# Patient Record
Sex: Female | Born: 1935 | Race: White | Hispanic: No | Smoking: Former smoker
Health system: Southern US, Community
[De-identification: ages and names within clinical notes are randomized; demographics above are authoritative.]

## PROBLEM LIST (undated history)

## (undated) DIAGNOSIS — I10 Essential (primary) hypertension: Secondary | ICD-10-CM

## (undated) DIAGNOSIS — M1990 Unspecified osteoarthritis, unspecified site: Secondary | ICD-10-CM

## (undated) DIAGNOSIS — F32A Depression, unspecified: Secondary | ICD-10-CM

## (undated) DIAGNOSIS — N289 Disorder of kidney and ureter, unspecified: Secondary | ICD-10-CM

## (undated) DIAGNOSIS — E119 Type 2 diabetes mellitus without complications: Secondary | ICD-10-CM

## (undated) DIAGNOSIS — E785 Hyperlipidemia, unspecified: Secondary | ICD-10-CM

## (undated) DIAGNOSIS — M5134 Other intervertebral disc degeneration, thoracic region: Secondary | ICD-10-CM

## (undated) DIAGNOSIS — W19XXXA Unspecified fall, initial encounter: Secondary | ICD-10-CM

## (undated) DIAGNOSIS — M7660 Achilles tendinitis, unspecified leg: Secondary | ICD-10-CM

## (undated) DIAGNOSIS — K5792 Diverticulitis of intestine, part unspecified, without perforation or abscess without bleeding: Secondary | ICD-10-CM

## (undated) DIAGNOSIS — I639 Cerebral infarction, unspecified: Secondary | ICD-10-CM

## (undated) DIAGNOSIS — F329 Major depressive disorder, single episode, unspecified: Secondary | ICD-10-CM

## (undated) DIAGNOSIS — N318 Other neuromuscular dysfunction of bladder: Secondary | ICD-10-CM

## (undated) DIAGNOSIS — H8110 Benign paroxysmal vertigo, unspecified ear: Secondary | ICD-10-CM

## (undated) DIAGNOSIS — F419 Anxiety disorder, unspecified: Secondary | ICD-10-CM

## (undated) DIAGNOSIS — K52832 Lymphocytic colitis: Secondary | ICD-10-CM

## (undated) DIAGNOSIS — H409 Unspecified glaucoma: Secondary | ICD-10-CM

## (undated) DIAGNOSIS — R413 Other amnesia: Secondary | ICD-10-CM

## (undated) DIAGNOSIS — M810 Age-related osteoporosis without current pathological fracture: Secondary | ICD-10-CM

## (undated) DIAGNOSIS — M25562 Pain in left knee: Secondary | ICD-10-CM

## (undated) DIAGNOSIS — N183 Chronic kidney disease, stage 3 unspecified: Secondary | ICD-10-CM

## (undated) DIAGNOSIS — Z853 Personal history of malignant neoplasm of breast: Secondary | ICD-10-CM

## (undated) DIAGNOSIS — M79606 Pain in leg, unspecified: Secondary | ICD-10-CM

## (undated) DIAGNOSIS — K219 Gastro-esophageal reflux disease without esophagitis: Secondary | ICD-10-CM

## (undated) DIAGNOSIS — E669 Obesity, unspecified: Secondary | ICD-10-CM

## (undated) HISTORY — DX: Other neuromuscular dysfunction of bladder: N31.8

## (undated) HISTORY — DX: Depression, unspecified: F32.A

## (undated) HISTORY — DX: Chronic kidney disease, stage 3 (moderate): N18.3

## (undated) HISTORY — DX: Personal history of malignant neoplasm of breast: Z85.3

## (undated) HISTORY — DX: Major depressive disorder, single episode, unspecified: F32.9

## (undated) HISTORY — DX: Chronic kidney disease, stage 3 unspecified: N18.30

## (undated) HISTORY — DX: Achilles tendinitis, unspecified leg: M76.60

## (undated) HISTORY — DX: Lymphocytic colitis: K52.832

## (undated) HISTORY — DX: Benign paroxysmal vertigo, unspecified ear: H81.10

## (undated) HISTORY — DX: Essential (primary) hypertension: I10

## (undated) HISTORY — DX: Obesity, unspecified: E66.9

## (undated) HISTORY — DX: Cerebral infarction, unspecified: I63.9

## (undated) HISTORY — DX: Gastro-esophageal reflux disease without esophagitis: K21.9

## (undated) HISTORY — DX: Anxiety disorder, unspecified: F41.9

## (undated) HISTORY — DX: Other amnesia: R41.3

## (undated) HISTORY — DX: Hyperlipidemia, unspecified: E78.5

## (undated) HISTORY — DX: Unspecified glaucoma: H40.9

## (undated) HISTORY — DX: Type 2 diabetes mellitus without complications: E11.9

## (undated) HISTORY — DX: Pain in leg, unspecified: M79.606

## (undated) HISTORY — DX: Unspecified osteoarthritis, unspecified site: M19.90

## (undated) HISTORY — DX: Unspecified fall, initial encounter: W19.XXXA

## (undated) HISTORY — DX: Age-related osteoporosis without current pathological fracture: M81.0

## (undated) HISTORY — PX: OTHER SURGICAL HISTORY: SHX169

## (undated) HISTORY — DX: Disorder of kidney and ureter, unspecified: N28.9

## (undated) HISTORY — DX: Pain in left knee: M25.562

---

## 1958-11-21 HISTORY — PX: CHOLECYSTECTOMY: SHX55

## 1998-11-21 HISTORY — PX: OTHER SURGICAL HISTORY: SHX169

## 2001-12-05 ENCOUNTER — Encounter: Payer: Self-pay | Admitting: Internal Medicine

## 2001-12-05 ENCOUNTER — Ambulatory Visit (HOSPITAL_COMMUNITY): Admission: RE | Admit: 2001-12-05 | Discharge: 2001-12-05 | Payer: Self-pay | Admitting: Internal Medicine

## 2002-05-02 ENCOUNTER — Inpatient Hospital Stay (HOSPITAL_COMMUNITY): Admission: RE | Admit: 2002-05-02 | Discharge: 2002-05-05 | Payer: Self-pay | Admitting: Obstetrics & Gynecology

## 2002-05-22 ENCOUNTER — Encounter: Payer: Self-pay | Admitting: Internal Medicine

## 2002-05-22 ENCOUNTER — Ambulatory Visit (HOSPITAL_COMMUNITY): Admission: RE | Admit: 2002-05-22 | Discharge: 2002-05-22 | Payer: Self-pay | Admitting: Internal Medicine

## 2002-11-21 HISTORY — PX: BREAST LUMPECTOMY: SHX2

## 2003-01-27 ENCOUNTER — Ambulatory Visit (HOSPITAL_COMMUNITY): Admission: RE | Admit: 2003-01-27 | Discharge: 2003-01-27 | Payer: Self-pay | Admitting: Internal Medicine

## 2003-01-27 ENCOUNTER — Encounter: Payer: Self-pay | Admitting: Internal Medicine

## 2003-02-04 ENCOUNTER — Ambulatory Visit (HOSPITAL_COMMUNITY): Admission: RE | Admit: 2003-02-04 | Discharge: 2003-02-04 | Payer: Self-pay | Admitting: Family Medicine

## 2003-02-04 ENCOUNTER — Encounter: Payer: Self-pay | Admitting: Family Medicine

## 2003-02-10 ENCOUNTER — Ambulatory Visit (HOSPITAL_COMMUNITY): Admission: RE | Admit: 2003-02-10 | Discharge: 2003-02-10 | Payer: Self-pay | Admitting: Neurology

## 2003-02-10 ENCOUNTER — Encounter: Payer: Self-pay | Admitting: Neurology

## 2003-03-11 ENCOUNTER — Encounter: Payer: Self-pay | Admitting: Family Medicine

## 2003-03-11 ENCOUNTER — Ambulatory Visit (HOSPITAL_COMMUNITY): Admission: RE | Admit: 2003-03-11 | Discharge: 2003-03-11 | Payer: Self-pay | Admitting: Family Medicine

## 2003-03-17 ENCOUNTER — Encounter: Payer: Self-pay | Admitting: Family Medicine

## 2003-03-17 ENCOUNTER — Ambulatory Visit (HOSPITAL_COMMUNITY): Admission: RE | Admit: 2003-03-17 | Discharge: 2003-03-17 | Payer: Self-pay | Admitting: Family Medicine

## 2003-03-24 ENCOUNTER — Encounter: Payer: Self-pay | Admitting: General Surgery

## 2003-03-24 ENCOUNTER — Ambulatory Visit (HOSPITAL_COMMUNITY): Admission: RE | Admit: 2003-03-24 | Discharge: 2003-03-24 | Payer: Self-pay | Admitting: General Surgery

## 2003-04-14 ENCOUNTER — Encounter: Payer: Self-pay | Admitting: General Surgery

## 2003-04-14 ENCOUNTER — Ambulatory Visit (HOSPITAL_COMMUNITY): Admission: RE | Admit: 2003-04-14 | Discharge: 2003-04-14 | Payer: Self-pay | Admitting: General Surgery

## 2003-05-12 ENCOUNTER — Encounter: Admission: RE | Admit: 2003-05-12 | Discharge: 2003-05-12 | Payer: Self-pay | Admitting: Oncology

## 2003-05-21 ENCOUNTER — Ambulatory Visit: Admission: RE | Admit: 2003-05-21 | Discharge: 2003-08-19 | Payer: Self-pay | Admitting: Radiation Oncology

## 2003-11-01 ENCOUNTER — Emergency Department (HOSPITAL_COMMUNITY): Admission: EM | Admit: 2003-11-01 | Discharge: 2003-11-01 | Payer: Self-pay | Admitting: Emergency Medicine

## 2003-12-17 ENCOUNTER — Ambulatory Visit (HOSPITAL_COMMUNITY): Admission: RE | Admit: 2003-12-17 | Discharge: 2003-12-17 | Payer: Self-pay | Admitting: Family Medicine

## 2004-03-31 ENCOUNTER — Ambulatory Visit (HOSPITAL_COMMUNITY): Admission: RE | Admit: 2004-03-31 | Discharge: 2004-03-31 | Payer: Self-pay | Admitting: Family Medicine

## 2004-05-12 ENCOUNTER — Encounter (HOSPITAL_COMMUNITY): Admission: RE | Admit: 2004-05-12 | Discharge: 2004-06-11 | Payer: Self-pay | Admitting: Oncology

## 2004-05-12 ENCOUNTER — Encounter: Admission: RE | Admit: 2004-05-12 | Discharge: 2004-05-12 | Payer: Self-pay | Admitting: Oncology

## 2005-04-20 ENCOUNTER — Ambulatory Visit (HOSPITAL_COMMUNITY): Admission: RE | Admit: 2005-04-20 | Discharge: 2005-04-20 | Payer: Self-pay | Admitting: Family Medicine

## 2005-05-31 ENCOUNTER — Encounter: Admission: RE | Admit: 2005-05-31 | Discharge: 2005-05-31 | Payer: Self-pay | Admitting: Oncology

## 2005-05-31 ENCOUNTER — Ambulatory Visit (HOSPITAL_COMMUNITY): Payer: Self-pay | Admitting: Oncology

## 2005-05-31 ENCOUNTER — Encounter (HOSPITAL_COMMUNITY): Admission: RE | Admit: 2005-05-31 | Discharge: 2005-06-30 | Payer: Self-pay | Admitting: Oncology

## 2006-02-27 ENCOUNTER — Ambulatory Visit: Payer: Self-pay | Admitting: Family Medicine

## 2006-03-02 ENCOUNTER — Ambulatory Visit (HOSPITAL_COMMUNITY): Admission: RE | Admit: 2006-03-02 | Discharge: 2006-03-02 | Payer: Self-pay | Admitting: Family Medicine

## 2006-03-02 ENCOUNTER — Ambulatory Visit: Payer: Self-pay | Admitting: Cardiology

## 2006-03-13 ENCOUNTER — Ambulatory Visit: Payer: Self-pay | Admitting: Family Medicine

## 2006-03-27 ENCOUNTER — Ambulatory Visit: Payer: Self-pay | Admitting: Family Medicine

## 2006-04-10 ENCOUNTER — Ambulatory Visit: Payer: Self-pay | Admitting: Family Medicine

## 2006-04-24 ENCOUNTER — Ambulatory Visit: Payer: Self-pay | Admitting: Family Medicine

## 2006-05-11 ENCOUNTER — Ambulatory Visit: Payer: Self-pay | Admitting: Family Medicine

## 2006-05-17 ENCOUNTER — Ambulatory Visit (HOSPITAL_COMMUNITY): Admission: RE | Admit: 2006-05-17 | Discharge: 2006-05-17 | Payer: Self-pay | Admitting: Family Medicine

## 2006-05-25 ENCOUNTER — Ambulatory Visit: Payer: Self-pay | Admitting: Family Medicine

## 2006-05-29 ENCOUNTER — Encounter: Admission: RE | Admit: 2006-05-29 | Discharge: 2006-05-29 | Payer: Self-pay | Admitting: Oncology

## 2006-05-29 ENCOUNTER — Ambulatory Visit (HOSPITAL_COMMUNITY): Payer: Self-pay | Admitting: Oncology

## 2006-05-29 ENCOUNTER — Ambulatory Visit (HOSPITAL_COMMUNITY): Admission: RE | Admit: 2006-05-29 | Discharge: 2006-05-29 | Payer: Self-pay | Admitting: Family Medicine

## 2006-05-29 ENCOUNTER — Encounter (HOSPITAL_COMMUNITY): Admission: RE | Admit: 2006-05-29 | Discharge: 2006-06-28 | Payer: Self-pay | Admitting: Oncology

## 2006-06-01 ENCOUNTER — Ambulatory Visit: Payer: Self-pay | Admitting: Family Medicine

## 2006-07-06 ENCOUNTER — Ambulatory Visit: Payer: Self-pay | Admitting: Family Medicine

## 2006-08-03 ENCOUNTER — Ambulatory Visit: Payer: Self-pay | Admitting: Family Medicine

## 2006-09-14 ENCOUNTER — Ambulatory Visit: Payer: Self-pay | Admitting: Family Medicine

## 2006-10-11 DIAGNOSIS — F329 Major depressive disorder, single episode, unspecified: Secondary | ICD-10-CM

## 2006-10-11 DIAGNOSIS — M810 Age-related osteoporosis without current pathological fracture: Secondary | ICD-10-CM | POA: Insufficient documentation

## 2006-10-11 DIAGNOSIS — H409 Unspecified glaucoma: Secondary | ICD-10-CM | POA: Insufficient documentation

## 2006-10-11 DIAGNOSIS — F32A Depression, unspecified: Secondary | ICD-10-CM | POA: Insufficient documentation

## 2006-10-11 DIAGNOSIS — F411 Generalized anxiety disorder: Secondary | ICD-10-CM

## 2006-10-11 DIAGNOSIS — E785 Hyperlipidemia, unspecified: Secondary | ICD-10-CM | POA: Insufficient documentation

## 2006-10-11 DIAGNOSIS — Z8673 Personal history of transient ischemic attack (TIA), and cerebral infarction without residual deficits: Secondary | ICD-10-CM | POA: Insufficient documentation

## 2006-10-11 DIAGNOSIS — Z853 Personal history of malignant neoplasm of breast: Secondary | ICD-10-CM

## 2006-10-11 DIAGNOSIS — Z8679 Personal history of other diseases of the circulatory system: Secondary | ICD-10-CM

## 2006-10-11 DIAGNOSIS — E669 Obesity, unspecified: Secondary | ICD-10-CM

## 2006-10-11 DIAGNOSIS — N318 Other neuromuscular dysfunction of bladder: Secondary | ICD-10-CM

## 2006-10-11 DIAGNOSIS — I10 Essential (primary) hypertension: Secondary | ICD-10-CM | POA: Insufficient documentation

## 2006-10-11 DIAGNOSIS — F339 Major depressive disorder, recurrent, unspecified: Secondary | ICD-10-CM | POA: Insufficient documentation

## 2006-10-11 DIAGNOSIS — K219 Gastro-esophageal reflux disease without esophagitis: Secondary | ICD-10-CM

## 2006-10-11 DIAGNOSIS — H8110 Benign paroxysmal vertigo, unspecified ear: Secondary | ICD-10-CM | POA: Insufficient documentation

## 2006-11-21 DIAGNOSIS — K52832 Lymphocytic colitis: Secondary | ICD-10-CM

## 2006-11-21 HISTORY — DX: Lymphocytic colitis: K52.832

## 2006-12-18 ENCOUNTER — Ambulatory Visit: Payer: Self-pay | Admitting: Family Medicine

## 2006-12-19 ENCOUNTER — Encounter (INDEPENDENT_AMBULATORY_CARE_PROVIDER_SITE_OTHER): Payer: Self-pay | Admitting: Family Medicine

## 2006-12-19 LAB — CONVERTED CEMR LAB
AST: 13 units/L (ref 0–37)
Cholesterol: 252 mg/dL — ABNORMAL HIGH (ref 0–200)
LDL Cholesterol: 154 mg/dL — ABNORMAL HIGH (ref 0–99)
Microalb Creat Ratio: 5.1 mg/g (ref 0.0–30.0)
Sodium: 142 meq/L (ref 135–145)
Total Protein: 7 g/dL (ref 6.0–8.3)
VLDL: 53 mg/dL — ABNORMAL HIGH (ref 0–40)

## 2007-01-10 ENCOUNTER — Ambulatory Visit (HOSPITAL_COMMUNITY): Admission: RE | Admit: 2007-01-10 | Discharge: 2007-01-10 | Payer: Self-pay | Admitting: Family Medicine

## 2007-01-10 ENCOUNTER — Ambulatory Visit: Payer: Self-pay | Admitting: Family Medicine

## 2007-01-12 ENCOUNTER — Telehealth (INDEPENDENT_AMBULATORY_CARE_PROVIDER_SITE_OTHER): Payer: Self-pay | Admitting: Family Medicine

## 2007-02-07 ENCOUNTER — Ambulatory Visit: Payer: Self-pay | Admitting: Family Medicine

## 2007-03-14 ENCOUNTER — Ambulatory Visit: Payer: Self-pay | Admitting: Family Medicine

## 2007-03-14 LAB — CONVERTED CEMR LAB
Glucose, Bld: 126 mg/dL
Hgb A1c MFr Bld: 6.7 %

## 2007-03-16 ENCOUNTER — Encounter (INDEPENDENT_AMBULATORY_CARE_PROVIDER_SITE_OTHER): Payer: Self-pay | Admitting: Family Medicine

## 2007-03-19 LAB — CONVERTED CEMR LAB
ALT: 13 units/L (ref 0–35)
AST: 16 units/L (ref 0–37)
Albumin: 4 g/dL (ref 3.5–5.2)
BUN: 23 mg/dL (ref 6–23)
Basophils Relative: 1 % (ref 0–1)
Cholesterol: 204 mg/dL — ABNORMAL HIGH (ref 0–200)
Eosinophils Absolute: 0.3 10*3/uL (ref 0.0–0.7)
Glucose, Bld: 147 mg/dL — ABNORMAL HIGH (ref 70–99)
Lymphocytes Relative: 32 % (ref 12–46)
MCHC: 32.6 g/dL (ref 30.0–36.0)
Monocytes Absolute: 0.4 10*3/uL (ref 0.2–0.7)
Neutrophils Relative %: 50 % (ref 43–77)
Platelets: 253 10*3/uL (ref 150–400)
RBC: 3.97 M/uL (ref 3.87–5.11)
Sodium: 142 meq/L (ref 135–145)
Total Bilirubin: 0.7 mg/dL (ref 0.3–1.2)
Total CHOL/HDL Ratio: 4.7
Triglycerides: 200 mg/dL — ABNORMAL HIGH (ref <150)
VLDL: 40 mg/dL (ref 0–40)
WBC: 4 10*3/uL (ref 4.0–10.5)

## 2007-05-07 ENCOUNTER — Telehealth (INDEPENDENT_AMBULATORY_CARE_PROVIDER_SITE_OTHER): Payer: Self-pay | Admitting: Family Medicine

## 2007-05-28 ENCOUNTER — Ambulatory Visit (HOSPITAL_COMMUNITY): Payer: Self-pay | Admitting: Oncology

## 2007-05-28 ENCOUNTER — Encounter (HOSPITAL_COMMUNITY): Admission: RE | Admit: 2007-05-28 | Discharge: 2007-06-27 | Payer: Self-pay | Admitting: Oncology

## 2007-05-29 ENCOUNTER — Encounter (INDEPENDENT_AMBULATORY_CARE_PROVIDER_SITE_OTHER): Payer: Self-pay | Admitting: Family Medicine

## 2007-05-30 ENCOUNTER — Encounter (INDEPENDENT_AMBULATORY_CARE_PROVIDER_SITE_OTHER): Payer: Self-pay | Admitting: Family Medicine

## 2007-06-13 ENCOUNTER — Ambulatory Visit: Payer: Self-pay | Admitting: Family Medicine

## 2007-06-13 ENCOUNTER — Telehealth (INDEPENDENT_AMBULATORY_CARE_PROVIDER_SITE_OTHER): Payer: Self-pay | Admitting: *Deleted

## 2007-06-13 LAB — CONVERTED CEMR LAB: Glucose, Bld: 146 mg/dL

## 2007-07-23 HISTORY — PX: COLONOSCOPY: SHX174

## 2007-08-01 ENCOUNTER — Encounter (INDEPENDENT_AMBULATORY_CARE_PROVIDER_SITE_OTHER): Payer: Self-pay | Admitting: Family Medicine

## 2007-08-01 ENCOUNTER — Encounter: Payer: Self-pay | Admitting: Gastroenterology

## 2007-08-01 ENCOUNTER — Ambulatory Visit: Payer: Self-pay | Admitting: Gastroenterology

## 2007-08-01 ENCOUNTER — Ambulatory Visit (HOSPITAL_COMMUNITY): Admission: RE | Admit: 2007-08-01 | Discharge: 2007-08-01 | Payer: Self-pay | Admitting: Gastroenterology

## 2007-09-07 ENCOUNTER — Encounter (INDEPENDENT_AMBULATORY_CARE_PROVIDER_SITE_OTHER): Payer: Self-pay | Admitting: Family Medicine

## 2007-09-10 ENCOUNTER — Telehealth (INDEPENDENT_AMBULATORY_CARE_PROVIDER_SITE_OTHER): Payer: Self-pay | Admitting: *Deleted

## 2007-09-10 LAB — CONVERTED CEMR LAB
HDL: 49 mg/dL (ref >39)
VLDL: 38 mg/dL (ref 0–40)

## 2007-09-12 ENCOUNTER — Ambulatory Visit: Payer: Self-pay | Admitting: Family Medicine

## 2007-09-12 LAB — CONVERTED CEMR LAB: Hgb A1c MFr Bld: 6.8 %

## 2007-09-18 ENCOUNTER — Ambulatory Visit: Payer: Self-pay | Admitting: Urgent Care

## 2007-10-11 ENCOUNTER — Encounter (INDEPENDENT_AMBULATORY_CARE_PROVIDER_SITE_OTHER): Payer: Self-pay | Admitting: Family Medicine

## 2007-10-15 ENCOUNTER — Telehealth (INDEPENDENT_AMBULATORY_CARE_PROVIDER_SITE_OTHER): Payer: Self-pay | Admitting: Family Medicine

## 2007-12-13 ENCOUNTER — Ambulatory Visit: Payer: Self-pay | Admitting: Family Medicine

## 2007-12-13 DIAGNOSIS — E1169 Type 2 diabetes mellitus with other specified complication: Secondary | ICD-10-CM | POA: Diagnosis present

## 2007-12-13 DIAGNOSIS — E785 Hyperlipidemia, unspecified: Secondary | ICD-10-CM

## 2007-12-13 LAB — CONVERTED CEMR LAB
Glucose, Bld: 123 mg/dL
Hgb A1c MFr Bld: 7.3 %

## 2007-12-18 ENCOUNTER — Encounter (INDEPENDENT_AMBULATORY_CARE_PROVIDER_SITE_OTHER): Payer: Self-pay | Admitting: Family Medicine

## 2007-12-18 ENCOUNTER — Telehealth (INDEPENDENT_AMBULATORY_CARE_PROVIDER_SITE_OTHER): Payer: Self-pay | Admitting: *Deleted

## 2007-12-19 ENCOUNTER — Telehealth (INDEPENDENT_AMBULATORY_CARE_PROVIDER_SITE_OTHER): Payer: Self-pay | Admitting: *Deleted

## 2007-12-19 LAB — CONVERTED CEMR LAB
Creatinine, Urine: 95 mg/dL
Microalb Creat Ratio: 8.2 mg/g (ref 0.0–30.0)

## 2008-01-10 ENCOUNTER — Ambulatory Visit (HOSPITAL_COMMUNITY): Admission: RE | Admit: 2008-01-10 | Discharge: 2008-01-10 | Payer: Self-pay | Admitting: Family Medicine

## 2008-01-10 ENCOUNTER — Ambulatory Visit: Payer: Self-pay | Admitting: Family Medicine

## 2008-01-15 ENCOUNTER — Telehealth (INDEPENDENT_AMBULATORY_CARE_PROVIDER_SITE_OTHER): Payer: Self-pay | Admitting: *Deleted

## 2008-01-15 ENCOUNTER — Encounter (INDEPENDENT_AMBULATORY_CARE_PROVIDER_SITE_OTHER): Payer: Self-pay | Admitting: Family Medicine

## 2008-01-15 DIAGNOSIS — IMO0002 Reserved for concepts with insufficient information to code with codable children: Secondary | ICD-10-CM | POA: Insufficient documentation

## 2008-01-16 ENCOUNTER — Telehealth (INDEPENDENT_AMBULATORY_CARE_PROVIDER_SITE_OTHER): Payer: Self-pay | Admitting: Family Medicine

## 2008-01-28 ENCOUNTER — Ambulatory Visit (HOSPITAL_COMMUNITY): Admission: RE | Admit: 2008-01-28 | Discharge: 2008-01-28 | Payer: Self-pay | Admitting: Family Medicine

## 2008-01-30 ENCOUNTER — Telehealth (INDEPENDENT_AMBULATORY_CARE_PROVIDER_SITE_OTHER): Payer: Self-pay | Admitting: *Deleted

## 2008-02-13 ENCOUNTER — Ambulatory Visit: Payer: Self-pay | Admitting: Family Medicine

## 2008-02-14 ENCOUNTER — Encounter (INDEPENDENT_AMBULATORY_CARE_PROVIDER_SITE_OTHER): Payer: Self-pay | Admitting: Family Medicine

## 2008-02-14 ENCOUNTER — Telehealth (INDEPENDENT_AMBULATORY_CARE_PROVIDER_SITE_OTHER): Payer: Self-pay | Admitting: *Deleted

## 2008-02-14 LAB — CONVERTED CEMR LAB
AST: 15 units/L (ref 0–37)
Albumin: 4.3 g/dL (ref 3.5–5.2)
Calcium: 8.7 mg/dL (ref 8.4–10.5)
Chloride: 106 meq/L (ref 96–112)
Creatinine, Ser: 1.3 mg/dL — ABNORMAL HIGH (ref 0.40–1.20)
Glucose, Bld: 166 mg/dL — ABNORMAL HIGH (ref 70–99)
Sodium: 141 meq/L (ref 135–145)
Total Bilirubin: 0.5 mg/dL (ref 0.3–1.2)

## 2008-03-26 ENCOUNTER — Telehealth (INDEPENDENT_AMBULATORY_CARE_PROVIDER_SITE_OTHER): Payer: Self-pay | Admitting: *Deleted

## 2008-03-26 ENCOUNTER — Ambulatory Visit: Payer: Self-pay | Admitting: Family Medicine

## 2008-03-26 DIAGNOSIS — N183 Chronic kidney disease, stage 3 (moderate): Secondary | ICD-10-CM

## 2008-03-26 DIAGNOSIS — J301 Allergic rhinitis due to pollen: Secondary | ICD-10-CM

## 2008-03-28 ENCOUNTER — Encounter (INDEPENDENT_AMBULATORY_CARE_PROVIDER_SITE_OTHER): Payer: Self-pay | Admitting: Family Medicine

## 2008-04-03 ENCOUNTER — Telehealth (INDEPENDENT_AMBULATORY_CARE_PROVIDER_SITE_OTHER): Payer: Self-pay | Admitting: Family Medicine

## 2008-04-18 ENCOUNTER — Encounter (INDEPENDENT_AMBULATORY_CARE_PROVIDER_SITE_OTHER): Payer: Self-pay | Admitting: Family Medicine

## 2008-04-22 ENCOUNTER — Encounter (INDEPENDENT_AMBULATORY_CARE_PROVIDER_SITE_OTHER): Payer: Self-pay | Admitting: Family Medicine

## 2008-04-22 ENCOUNTER — Ambulatory Visit: Payer: Self-pay | Admitting: Gastroenterology

## 2008-04-22 LAB — CONVERTED CEMR LAB
Albumin: 4 g/dL (ref 3.5–5.2)
Alkaline Phosphatase: 109 units/L (ref 39–117)
BUN: 27 mg/dL — ABNORMAL HIGH (ref 6–23)
CO2: 27 meq/L (ref 19–32)
Calcium: 9.1 mg/dL (ref 8.4–10.5)
Cholesterol: 223 mg/dL — ABNORMAL HIGH (ref 0–200)
Glucose, Bld: 193 mg/dL — ABNORMAL HIGH (ref 70–99)
HDL: 57 mg/dL (ref >39)
Potassium: 4.3 meq/L (ref 3.5–5.3)
Sodium: 141 meq/L (ref 135–145)
Total Bilirubin: 0.6 mg/dL (ref 0.3–1.2)
Triglycerides: 202 mg/dL — ABNORMAL HIGH (ref <150)
VLDL: 40 mg/dL (ref 0–40)

## 2008-04-23 ENCOUNTER — Ambulatory Visit: Payer: Self-pay | Admitting: Family Medicine

## 2008-05-27 ENCOUNTER — Ambulatory Visit (HOSPITAL_COMMUNITY): Payer: Self-pay | Admitting: Family Medicine

## 2008-05-27 ENCOUNTER — Encounter (HOSPITAL_COMMUNITY): Admission: RE | Admit: 2008-05-27 | Discharge: 2008-06-26 | Payer: Self-pay | Admitting: Oncology

## 2008-06-04 ENCOUNTER — Ambulatory Visit: Payer: Self-pay | Admitting: Family Medicine

## 2008-06-04 LAB — CONVERTED CEMR LAB
Glucose, Urine, Semiquant: NEGATIVE
Nitrite: NEGATIVE
Specific Gravity, Urine: 1.015
pH: 6

## 2008-06-05 ENCOUNTER — Encounter (INDEPENDENT_AMBULATORY_CARE_PROVIDER_SITE_OTHER): Payer: Self-pay | Admitting: Family Medicine

## 2008-06-19 ENCOUNTER — Encounter: Payer: Self-pay | Admitting: Orthopedic Surgery

## 2008-06-19 ENCOUNTER — Ambulatory Visit (HOSPITAL_COMMUNITY): Admission: RE | Admit: 2008-06-19 | Discharge: 2008-06-19 | Payer: Self-pay | Admitting: Family Medicine

## 2008-06-19 ENCOUNTER — Encounter (INDEPENDENT_AMBULATORY_CARE_PROVIDER_SITE_OTHER): Payer: Self-pay | Admitting: Family Medicine

## 2008-06-23 ENCOUNTER — Encounter (INDEPENDENT_AMBULATORY_CARE_PROVIDER_SITE_OTHER): Payer: Self-pay | Admitting: Family Medicine

## 2008-07-11 ENCOUNTER — Encounter (HOSPITAL_COMMUNITY): Admission: RE | Admit: 2008-07-11 | Discharge: 2008-08-10 | Payer: Self-pay | Admitting: Podiatry

## 2008-07-11 ENCOUNTER — Encounter: Payer: Self-pay | Admitting: Orthopedic Surgery

## 2008-07-16 ENCOUNTER — Ambulatory Visit: Payer: Self-pay | Admitting: Family Medicine

## 2008-07-16 LAB — CONVERTED CEMR LAB
Glucose, Bld: 173 mg/dL
Hgb A1c MFr Bld: 7.7 %

## 2008-07-18 ENCOUNTER — Encounter (INDEPENDENT_AMBULATORY_CARE_PROVIDER_SITE_OTHER): Payer: Self-pay | Admitting: Family Medicine

## 2008-07-25 ENCOUNTER — Encounter (INDEPENDENT_AMBULATORY_CARE_PROVIDER_SITE_OTHER): Payer: Self-pay | Admitting: Family Medicine

## 2008-07-29 ENCOUNTER — Encounter (INDEPENDENT_AMBULATORY_CARE_PROVIDER_SITE_OTHER): Payer: Self-pay | Admitting: Family Medicine

## 2008-07-30 LAB — CONVERTED CEMR LAB
ALT: 13 units/L (ref 0–35)
AST: 16 units/L (ref 0–37)
Alkaline Phosphatase: 77 units/L (ref 39–117)
Cholesterol: 207 mg/dL — ABNORMAL HIGH (ref 0–200)
HDL: 46 mg/dL (ref >39)
Sodium: 141 meq/L (ref 135–145)
Total Bilirubin: 0.5 mg/dL (ref 0.3–1.2)
Total CHOL/HDL Ratio: 4.5
Triglycerides: 290 mg/dL — ABNORMAL HIGH (ref <150)
VLDL: 58 mg/dL — ABNORMAL HIGH (ref 0–40)

## 2008-10-14 ENCOUNTER — Ambulatory Visit: Payer: Self-pay | Admitting: Family Medicine

## 2008-10-14 ENCOUNTER — Ambulatory Visit (HOSPITAL_COMMUNITY): Admission: RE | Admit: 2008-10-14 | Discharge: 2008-10-14 | Payer: Self-pay | Admitting: Family Medicine

## 2008-10-14 ENCOUNTER — Encounter: Payer: Self-pay | Admitting: Orthopedic Surgery

## 2008-10-14 DIAGNOSIS — R609 Edema, unspecified: Secondary | ICD-10-CM | POA: Insufficient documentation

## 2008-10-14 DIAGNOSIS — R600 Localized edema: Secondary | ICD-10-CM | POA: Insufficient documentation

## 2008-10-14 LAB — CONVERTED CEMR LAB: Glucose, Bld: 154 mg/dL

## 2008-10-20 ENCOUNTER — Encounter (INDEPENDENT_AMBULATORY_CARE_PROVIDER_SITE_OTHER): Payer: Self-pay | Admitting: Family Medicine

## 2008-10-22 LAB — CONVERTED CEMR LAB
AST: 12 units/L (ref 0–37)
Albumin: 4.2 g/dL (ref 3.5–5.2)
Alkaline Phosphatase: 72 units/L (ref 39–117)
Basophils Absolute: 0 10*3/uL (ref 0.0–0.1)
CO2: 21 meq/L (ref 19–32)
Calcium: 9.4 mg/dL (ref 8.4–10.5)
Chloride: 106 meq/L (ref 96–112)
Creatinine, Ser: 1.25 mg/dL — ABNORMAL HIGH (ref 0.40–1.20)
Eosinophils Absolute: 0.3 10*3/uL (ref 0.0–0.7)
Eosinophils Relative: 5 % (ref 0–5)
Glucose, Bld: 173 mg/dL — ABNORMAL HIGH (ref 70–99)
Lymphocytes Relative: 22 % (ref 12–46)
MCHC: 31.3 g/dL (ref 30.0–36.0)
Monocytes Relative: 9 % (ref 3–12)
Neutro Abs: 4 10*3/uL (ref 1.7–7.7)
Neutrophils Relative %: 64 % (ref 43–77)
Platelets: 289 10*3/uL (ref 150–400)
Pro B Natriuretic peptide (BNP): 247.6 pg/mL — ABNORMAL HIGH (ref 0.0–100.0)
RBC: 4.03 M/uL (ref 3.87–5.11)
RDW: 13.4 % (ref 11.5–15.5)
Total Protein: 6.9 g/dL (ref 6.0–8.3)

## 2008-10-27 ENCOUNTER — Ambulatory Visit: Payer: Self-pay | Admitting: Orthopedic Surgery

## 2008-10-27 DIAGNOSIS — M7660 Achilles tendinitis, unspecified leg: Secondary | ICD-10-CM | POA: Insufficient documentation

## 2008-10-28 ENCOUNTER — Ambulatory Visit: Payer: Self-pay | Admitting: Family Medicine

## 2008-10-30 ENCOUNTER — Encounter: Payer: Self-pay | Admitting: Orthopedic Surgery

## 2008-10-30 ENCOUNTER — Encounter (HOSPITAL_COMMUNITY): Admission: RE | Admit: 2008-10-30 | Discharge: 2008-11-29 | Payer: Self-pay | Admitting: Orthopedic Surgery

## 2008-12-01 ENCOUNTER — Ambulatory Visit: Payer: Self-pay | Admitting: Family Medicine

## 2008-12-05 ENCOUNTER — Encounter (INDEPENDENT_AMBULATORY_CARE_PROVIDER_SITE_OTHER): Payer: Self-pay | Admitting: Family Medicine

## 2008-12-08 LAB — CONVERTED CEMR LAB
Albumin: 4.1 g/dL (ref 3.5–5.2)
CO2: 23 meq/L (ref 19–32)
Chloride: 108 meq/L (ref 96–112)
Creatinine, Ser: 1.45 mg/dL — ABNORMAL HIGH (ref 0.40–1.20)
LDL Cholesterol: 107 mg/dL — ABNORMAL HIGH (ref 0–99)
Total CHOL/HDL Ratio: 3.9
Triglycerides: 188 mg/dL — ABNORMAL HIGH (ref <150)

## 2008-12-15 ENCOUNTER — Ambulatory Visit: Payer: Self-pay | Admitting: Family Medicine

## 2008-12-15 ENCOUNTER — Ambulatory Visit: Payer: Self-pay | Admitting: Orthopedic Surgery

## 2009-01-12 ENCOUNTER — Ambulatory Visit: Payer: Self-pay | Admitting: Family Medicine

## 2009-01-12 LAB — CONVERTED CEMR LAB: Glucose, Bld: 154 mg/dL

## 2009-01-15 ENCOUNTER — Ambulatory Visit: Payer: Self-pay | Admitting: Family Medicine

## 2009-01-16 ENCOUNTER — Encounter (INDEPENDENT_AMBULATORY_CARE_PROVIDER_SITE_OTHER): Payer: Self-pay | Admitting: Family Medicine

## 2009-01-19 ENCOUNTER — Ambulatory Visit: Payer: Self-pay | Admitting: Family Medicine

## 2009-01-20 ENCOUNTER — Telehealth (INDEPENDENT_AMBULATORY_CARE_PROVIDER_SITE_OTHER): Payer: Self-pay | Admitting: *Deleted

## 2009-01-22 ENCOUNTER — Ambulatory Visit (HOSPITAL_COMMUNITY): Admission: RE | Admit: 2009-01-22 | Discharge: 2009-01-22 | Payer: Self-pay | Admitting: Family Medicine

## 2009-01-22 ENCOUNTER — Encounter (INDEPENDENT_AMBULATORY_CARE_PROVIDER_SITE_OTHER): Payer: Self-pay | Admitting: Family Medicine

## 2009-01-27 ENCOUNTER — Ambulatory Visit: Payer: Self-pay | Admitting: Family Medicine

## 2009-02-05 ENCOUNTER — Telehealth (INDEPENDENT_AMBULATORY_CARE_PROVIDER_SITE_OTHER): Payer: Self-pay | Admitting: Family Medicine

## 2009-02-10 ENCOUNTER — Telehealth (INDEPENDENT_AMBULATORY_CARE_PROVIDER_SITE_OTHER): Payer: Self-pay | Admitting: *Deleted

## 2009-02-23 ENCOUNTER — Ambulatory Visit: Payer: Self-pay | Admitting: Family Medicine

## 2009-04-23 LAB — CONVERTED CEMR LAB
ALT: 10 units/L (ref 0–35)
AST: 15 units/L (ref 0–37)
Alkaline Phosphatase: 60 units/L (ref 39–117)
BUN: 35 mg/dL — ABNORMAL HIGH (ref 6–23)
Basophils Relative: 0 % (ref 0–1)
Chloride: 107 meq/L (ref 96–112)
Cholesterol: 173 mg/dL (ref 0–200)
Creatinine, Ser: 1.43 mg/dL — ABNORMAL HIGH (ref 0.40–1.20)
Eosinophils Absolute: 0.4 10*3/uL (ref 0.0–0.7)
Glucose, Bld: 102 mg/dL — ABNORMAL HIGH (ref 70–99)
HCT: 33.2 % — ABNORMAL LOW (ref 36.0–46.0)
HDL: 50 mg/dL (ref >39)
Hemoglobin: 11.1 g/dL — ABNORMAL LOW (ref 12.0–15.0)
LDL Cholesterol: 93 mg/dL (ref 0–99)
Microalb, Ur: 1.39 mg/dL (ref 0.00–1.89)
Monocytes Relative: 8 % (ref 3–12)
Neutrophils Relative %: 60 % (ref 43–77)
Sodium: 143 meq/L (ref 135–145)
TSH: 1.317 microintl units/mL (ref 0.350–4.500)
Total Bilirubin: 0.5 mg/dL (ref 0.3–1.2)
Triglycerides: 148 mg/dL (ref <150)
WBC: 6.9 10*3/uL (ref 4.0–10.5)

## 2009-04-27 ENCOUNTER — Ambulatory Visit: Payer: Self-pay | Admitting: Family Medicine

## 2009-04-27 DIAGNOSIS — D649 Anemia, unspecified: Secondary | ICD-10-CM | POA: Insufficient documentation

## 2009-04-28 ENCOUNTER — Encounter (INDEPENDENT_AMBULATORY_CARE_PROVIDER_SITE_OTHER): Payer: Self-pay | Admitting: Family Medicine

## 2009-04-28 LAB — CONVERTED CEMR LAB
Saturation Ratios: 29 % (ref 20–55)
UIBC: 257 ug/dL

## 2009-04-29 ENCOUNTER — Encounter (INDEPENDENT_AMBULATORY_CARE_PROVIDER_SITE_OTHER): Payer: Self-pay | Admitting: Family Medicine

## 2009-06-11 ENCOUNTER — Ambulatory Visit (HOSPITAL_COMMUNITY): Admission: RE | Admit: 2009-06-11 | Discharge: 2009-06-11 | Payer: Self-pay | Admitting: Family Medicine

## 2009-06-17 ENCOUNTER — Ambulatory Visit: Payer: Self-pay | Admitting: Family Medicine

## 2009-06-17 LAB — CONVERTED CEMR LAB: Hgb A1c MFr Bld: 6.2 %

## 2009-07-30 ENCOUNTER — Encounter (INDEPENDENT_AMBULATORY_CARE_PROVIDER_SITE_OTHER): Payer: Self-pay | Admitting: Family Medicine

## 2010-06-10 ENCOUNTER — Ambulatory Visit (HOSPITAL_COMMUNITY): Admission: RE | Admit: 2010-06-10 | Discharge: 2010-06-10 | Payer: Self-pay | Admitting: Family Medicine

## 2010-07-07 ENCOUNTER — Ambulatory Visit (HOSPITAL_COMMUNITY): Admission: RE | Admit: 2010-07-07 | Discharge: 2010-07-07 | Payer: Self-pay | Admitting: Family Medicine

## 2010-12-19 LAB — CONVERTED CEMR LAB
Cholesterol, target level: 200 mg/dL
Hgb A1c MFr Bld: 6.7 %
Pap Smear: NORMAL

## 2011-01-02 ENCOUNTER — Emergency Department (HOSPITAL_COMMUNITY)
Admission: EM | Admit: 2011-01-02 | Discharge: 2011-01-02 | Disposition: A | Discharge status: Home / Self Care | Payer: Medicare Other | Attending: Emergency Medicine | Admitting: Emergency Medicine

## 2011-01-02 ENCOUNTER — Emergency Department (HOSPITAL_COMMUNITY): Payer: Medicare Other

## 2011-01-02 DIAGNOSIS — Y92009 Unspecified place in unspecified non-institutional (private) residence as the place of occurrence of the external cause: Secondary | ICD-10-CM | POA: Insufficient documentation

## 2011-01-02 DIAGNOSIS — I10 Essential (primary) hypertension: Secondary | ICD-10-CM | POA: Insufficient documentation

## 2011-01-02 DIAGNOSIS — W1809XA Striking against other object with subsequent fall, initial encounter: Secondary | ICD-10-CM | POA: Insufficient documentation

## 2011-01-02 DIAGNOSIS — S0100XA Unspecified open wound of scalp, initial encounter: Secondary | ICD-10-CM | POA: Insufficient documentation

## 2011-01-02 DIAGNOSIS — S0990XA Unspecified injury of head, initial encounter: Secondary | ICD-10-CM | POA: Insufficient documentation

## 2011-01-02 DIAGNOSIS — Z79899 Other long term (current) drug therapy: Secondary | ICD-10-CM | POA: Insufficient documentation

## 2011-01-02 DIAGNOSIS — E119 Type 2 diabetes mellitus without complications: Secondary | ICD-10-CM | POA: Insufficient documentation

## 2011-02-03 ENCOUNTER — Other Ambulatory Visit (HOSPITAL_COMMUNITY): Payer: Self-pay | Admitting: Oncology

## 2011-02-03 DIAGNOSIS — C50919 Malignant neoplasm of unspecified site of unspecified female breast: Secondary | ICD-10-CM

## 2011-02-16 ENCOUNTER — Ambulatory Visit (HOSPITAL_COMMUNITY): Payer: Medicare Other

## 2011-02-23 ENCOUNTER — Ambulatory Visit (HOSPITAL_COMMUNITY)
Admission: RE | Admit: 2011-02-23 | Discharge: 2011-02-23 | Disposition: A | Discharge status: Home / Self Care | Payer: Medicare Other | Source: Ambulatory Visit | Attending: Oncology | Admitting: Oncology

## 2011-02-23 DIAGNOSIS — R928 Other abnormal and inconclusive findings on diagnostic imaging of breast: Secondary | ICD-10-CM | POA: Insufficient documentation

## 2011-02-23 DIAGNOSIS — Z853 Personal history of malignant neoplasm of breast: Secondary | ICD-10-CM | POA: Insufficient documentation

## 2011-02-23 DIAGNOSIS — C50919 Malignant neoplasm of unspecified site of unspecified female breast: Secondary | ICD-10-CM

## 2011-04-05 NOTE — Op Note (Signed)
Nancy Medina, Nancy Medina               ACCOUNT NO.:  [id number]   MEDICAL RECORD NO.:  [id number]          PATIENT TYPE:  AMB   LOCATION:  DAY                           FACILITY:  [facility]   PHYSICIAN:  Caro Hight, M.D.      DATE OF BIRTH:  1925-03-07   DATE OF PROCEDURE:  08/01/2007  DATE OF DISCHARGE:                               OPERATIVE REPORT   PROCEDURE:  Colonoscopy with random cold forceps biopsies.   INDICATION FOR EXAM:  Ms. Kozloff is a 77 year old female who presents  for screening.  She is also complaining of diarrhea.   FINDINGS:  Angulation of the rectosigmoid joint approximately 20 cm from  the anal verge.  It required changing from the adult colonoscope to the  pediatric colonoscope which passed easily.  She also has significant  severe diverticulosis at this angulation which makes passing this area  very challenging.  Moderate internal hemorrhoids.  Otherwise, no polyps,  masses, inflammatory changes, or AVMs.   RECOMMENDATIONS:  1. She should follow a high fiber diet.  A high fiber diet handout and      a handout on diverticulosis was given.  2. Will call her with the results of her biopsies.  Biopsies were      obtained via cold forceps to evaluate for microscopic colitis.  3. No aspirin, NSAIDs or anticoagulation for seven days.  4. Screening conoscopy in 10 years.   MEDICATIONS:  1. Demerol 50 mg IV.  2. Versed 5 mg IV.   PROCEDURE TECHNIQUE:  Physical exam was performed.  Informed consent was  obtained from the patient after explaining the benefits, risks and  alternatives to the procedure.  The patient was connected to the monitor  and placed in the left lateral position.  Continuous oxygen was provided  by nasal cannula and IV medicine administered through an indwelling  cannula.  After administration of sedation and rectal exam, the  patient's rectum was intubated  and the scope was advanced under direct visualization to the cecum.  The  scope was removed  slowly by carefully examining the color, texture,  anatomy, and integrity of the mucosa on the way out.  The patient was  recovered in endoscopy and discharged home in satisfactory condition.      Caro Hight, M.D.  Electronically Signed     SM/MEDQ  D:  08/01/2007  T:  08/01/2007  Job:  [id number]   cc:   Weston Settle, M.D.

## 2011-04-05 NOTE — Assessment & Plan Note (Signed)
NAMEMarland Kitchen  CHAZZ, DEMKE                CHART#:  [id number]   DATE:  04/22/2008                       DOB:  February 20, 1925   PROBLEM LIST:  1. Colonoscopy in September 2008 with random cold forceps biopsies      showing findings consistent with lymphocytic colitis.  2. Hypertension.  3. Hyperlipidemia.  4. Cholecystectomy.  5. Anxiety.  6. Glaucoma.  7. Internal hemorrhoids.  8. Diverticulosis.  9. Left knee pain.  10.Left breast cancer, diagnosed 5 years ago, requiring lumpectomy and      radiation.  11.CVA 5 years ago.  12.Gastroesophageal reflux disease.   SUBJECTIVE:  Ms. Salcido is a 77 year old female who presents as a return  patient visit.  She was seen in September 2008, and was complaining of  diarrhea when she presented for a screening colonoscopy.  Biopsies  showed lymphocytic colitis.  She had mild chronic inflammation within  the lamina propria and patchy areas where there was subtle increases in  the lymphocytes consistent with lymphocytic colitis.  She was asked to  take Lialda 2.4 g daily.  She remained on that for 2 months, and then  the medicine was discontinued.  She is doing well today and has no  questions, concerns, or complaints.  It is very rare for her to have  diarrhea.  She has 1-2 bowel movements a day.  She denies any nausea,  vomiting, or problems swallowing.  She is using less than one Imodium  per month.   MEDICATIONS:  1. Naproxen.  2. Norvasc.  3. Atacand.  4. Clonidine.  5. Nexium 40 mg daily.  6. Metoprolol.  7. Fosamax monthly.  8. Alamast.  9. Xalatan.  10.Pravastatin.  11.Fish oil.  12.Aspirin 81 mg daily.   OBJECTIVE:   PHYSICAL EXAMINATION:  Weight 186 pounds, height 5 feet 4 inches,  temperature 97.4, blood pressure 126/88, and pulse 66.GENERAL:  She is  in no apparent distress.  Alert and oriented x4.LUNGS:  Clear to  auscultation bilaterally.CARDIOVASCULAR:  Regular rhythm.  No  murmur.ABDOMEN:  Bowel sounds present, soft,  nontender, and  nondistended.   ASSESSMENT:  Ms. Dillehay is an 77 year old female who had diarrhea  secondary to lymphocytic colitis which responded to mesalamine.  Thank  you for allowing me to see Ms. Borton in consultation.  My  recommendations are as follows.   RECOMMENDATIONS:  1. She may follow up with me as needed.  She should continue her high-      fiber diet.  2. We would consider repeat colonoscopy with a pediatric colonoscope      in 15-20 years.       Caro Hight, M.D.  Electronically Signed     SM/MEDQ  D:  04/22/2008  T:  04/23/2008  Job:  [id number]   cc:   Weston Settle, M.D.

## 2011-04-05 NOTE — Assessment & Plan Note (Signed)
NAMEMarland Kitchen  Medina, Nancy Medina                CHART#:  [id number]   DATE:  09/18/2007                       DOB:  1925/06/12   CHIEF COMPLAINT:  Follow up microscopic colitis.   SUBJECTIVE:  Nancy Medina is a 77 year old female, who was set up for a  screening colonoscopy by Dr. Stann Mainland on August 01, 2007.  During the  interview, she mentioned to Dr. Stann Mainland that she had a history of  diarrhea.  Biopsies were obtained and were consistent with lymphocytic  colitis.  Dr. Stann Mainland started her on Lialda 1.2 g b.i.d.  She presents  today for followup.   Overall, she is about the same.  She continues to have between two and  five loose, runny bowel movements a day.  She has a relatively life-long  history of this and states she has just gotten used to it.  She does  take Imodium when she needs to go out, so she does not have significant  urgency.  She denies any abdominal pain, denies any fever or chills,  denies any nausea or vomiting.  She is on Naproxen b.i.d.  She states  this is the only thing that has helped with her left-knee arthritis.  She denies any rectal bleeding or melena.  Her weight has remained  stable.   PAST MEDICAL AND SURGICAL HISTORY:  Lymphocytic colitis with history of  chronic diarrhea, see HPI.  Colonoscopy by Dr. Stann Mainland on August 01, 2007, with biopsies consistent with microscopic/lymphocytic colitis,  moderate internal hemorrhoids, and severe diverticulosis.  Chronic left-  knee arthritic pain.  She has history of left-breast carcinoma,  diagnosed five years ago, status post lumpectomy and radiation, followed  by Dr. Tressie Stalker.  She is status post CVA, five years ago.  She has a  history of glaucoma, hyperlipidemia, chronic GERD, and anemia during her  child-rearing years.   CURRENT MEDICATIONS:  1. Lialda 1.2 g b.i.d.  2. Naproxen 500 mg b.i.d.  3. Atacand 32/12.5 mg daily.  4. Clonidine 0.2 mg daily.  5. Nexium 40 mg daily.  6. Metoprolol 50 mg daily.  7. Fosamax 75  mg monthly.  8. Alamast eye drops daily.  9. Xalatan eye drops daily.  10.Zetia 10 mg daily.  11.Imodium 2 mg q.a.m. p.r.n.   ALLERGIES:  AMLODIPINE.   FAMILY HISTORY:  There is no known family history of inflammatory bowel  disease, colorectal carcinoma, liver or chronic GI problems.   SOCIAL HISTORY:  Nancy Medina is a widow.  She lives alone.  She has one  son with Addison's disease and a history of non-Hodgkin's lymphoma, one  daughter who is healthy.  She denies any tobacco, alcohol or drug use.   REVIEW OF SYSTEMS:  See HPI, otherwise negative.   PHYSICAL EXAM:  VITAL SIGNS:  Weight 186 pounds, height 51 inches,  temperature 97.5, blood pressure 118/78, pulse 64.  GENERAL:  She is well-developed, well-nourished, in no acute distress.  HEENT:  Sclerae clear, anicteric.  Conjunctivae pink.  Oropharynx pink  and moist without any lesions.  NECK:  Supple, no thyromegaly.  CHEST/HEART:  Regular rate and rhythm, normal S1 and S2 without murmurs,  rubs or gallops.  Lungs clear to auscultation bilaterally.  ABDOMEN:  Positive bowel sounds times four.  No bruits auscultated.  Soft, nontender, nondistended, without palpable mass or  hepatosplenomegaly, no rebound tenderness or guarding.  EXTREMITIES:  Without clubbing or edema bilaterally.  SKIN:  Pink, warm and dry without any rash or jaundice.   IMPRESSION:  Nancy Medina is a 77 year old female with  microscopic/lymphocytic colitis.  She has had minimal improvement with  Lialda 2.4 g daily.  She is also on Naproxen for arthritis, which could  be contributing to this, although she notes life-long history of  diarrhea.  She does seem to get good symptom control with Imodium.   PLAN:  1. Would continue Lialda 2.4 g daily.  2. Imodium 2 mg in the a.m. as needed for diarrhea.  3. Limit use of Naproxen, although she tells me this is the only thing      that helps her left-knee arthritis, and she can discuss this      further with Dr.  Jonna Munro.  4. We need to check creatinine.  5. Information was given on lymphocytic colitis for her review.  6. Office visit in three months with Dr. Stann Mainland.  At that point in time      we can determine whether she is going to need to be on chronic      mesalamine.      Vickey Huger, N.P.  Electronically Signed     Caro Hight, M.D.  Electronically Signed   KJ/MEDQ  D:  09/19/2007  T:  09/19/2007  Job:  [id number]   cc:   Weston Settle, M.D.

## 2011-04-08 NOTE — Op Note (Signed)
[facility]  Patient:    Nancy Medina, Nancy Medina Visit Number: [id number] MRN: [id number]          Service Type: MED Location: 3A A330 01 Attending Physician:  Florian Buff Dictated by:   Miguel Dibble, M.D. Proc. Date: 05/02/02 Admit Date:  05/02/2002   CC:         Tania Ade, M.D.   Operative Report  PREOPERATIVE DIAGNOSES:  Pelvic relaxation, stress urinary incontinence.  POSTOPERATIVE DIAGNOSES:  Pelvic relaxation, stress urinary incontinence.  OPERATION PERFORMED:  Transvaginal tape urethral sling procedure.  ANESTHESIA:  General.  SURGEON:  Miguel Dibble, M.D.  ASSISTANT:  Tania Ade, M.D.  ESTIMATED BLOOD LOSS:  Minimal.  COMPLICATIONS:  None.  DRAINS:  77 French Foley catheter in the bladder.  INDICATIONS FOR PROCEDURE:  This 77 year old female had significant stress urinary incontinence with associated pelvic relaxation. She has been evaluated by Dr. Elonda Husky and scheduled to undergo vaginal hysterectomy with anterior and posterior repair. I plan to do transvaginal tape urethral sling procedure under the same anesthesia.  DESCRIPTION OF PROCEDURE:  General anesthesia was induced and the patient was placed in the high lithotomy position. The lower abdomen and genitalia were prepped and draped in a sterile fashion. A 16 French Foley catheter was inserted into the bladder and clear urine was drained. Dr. Elonda Husky proceeded with the vaginal hysterectomy first. He made a midline incision in the anterior vaginal and the bladder was mobilized.  The pubic tubercles were marked on the skin. A point about 1.4 cm above and lateral to the pubic tubercle were marked on either side. There were 10 cc of 0.25% Marcaine injected through the suprapubic area to create hydrodeflection of the perivesical spaces on both sides. The urethra was then held with Allis forceps. An incision about 1.4 cm in length was made in the midline over the mid urethra. The  vaginal mucosal flaps were raised on both sides for insertion of the trocar.  A 20 French Foley catheter was inserted into the bladder. The catheter was guide was inserted and the bladder neck was reflected to the left side. With distal guidance, the trocar carrying the guide tube was then inserted on the right side. The trocar was inserted high in the pubic ramus through the endopelvic fascia to exit through the previously marked point on the skin. Cystoscopy was done and there was no evidence of any bladder penetration. The trocar was then taken out of the suprapubic incision leaving the guide tube in place. A similar procedure was done on the left side. Cystoscopy confirmed proper positioning of the trocar. The Prolene mesh tip was attached to the guide tubes and pulled through the suprapubic incision. Cystoscopy was repeated and there was no evidence of any entry into the bladder. The bladder was then filled with about 300 cc of water. Suprapubic compression was done and the tension of the tape was adjusted to avoid any leakage of urine. After this, the forceps could be placed within the urethra and the tape without any difficulty. The trocars was then removed from the plastic sheets. The plastic sheets were then removed. The tape was trimmed at the level of the subcutaneous tissue. The suprapubic incisions were closed using 3-0 Vicryl subcuticular suture. The urethral incision was closed using 3-0 Vicryl sutures. A 20 French Foley catheter was then inserted into the bladder. Dr. Elonda Husky then proceeded with completion of the anterior repair as well as the posterior repair. The estimated blood loss was minimal  for the TVT procedure. The patient was transferred to the PACU in a satisfactory condition. Dictated by:   Miguel Dibble, M.D. Attending Physician:  Florian Buff DD:  05/02/02 TD:  05/04/02 Job: [id number]

## 2011-04-08 NOTE — Procedures (Signed)
   NAME:  Nancy Medina, Nancy Medina                         ACCOUNT NO.:  [id number]   MEDICAL RECORD NO.:  [id number]                   PATIENT TYPE:  OUT   LOCATION:  RAD                                  FACILITY:  [facility]   PHYSICIAN:  Scarlett Presto, M.D.                DATE OF BIRTH:  1925-11-12   DATE OF PROCEDURE:  02/10/2003  DATE OF DISCHARGE:                                  ECHOCARDIOGRAM   REFERRING PHYSICIAN:  Kofi A. Merlene Laughter, M.D.   TAPE [id number]   TAPE COUNT:  [id number]   CLINICAL INFORMATION:  The patient is a 77 year old ________ with a CVA,  question of aortic stenosis, and hypertension.  Technical quality of the  study is adequate.   PREVIOUS STUDY:   M-MODE:  AORTA:  31 mm  (<4.0)  LEFT ATRIUM:  36 mm  (<4.0)  SEPTUM:  18 mm which is enlarged  (0.7-1.1)  POSTERIOR WALL:  14 mm which is enlarged  (0.7-1.1)  LV-DIASTOLE:  41 mm  (<5.7)  LV-SYSTOLE:  30 mm  (<4.0)  E-SEPTAL:  (<0.5)  RV-DIASTOLE:  (<2.5)  IVC:  (<2.0)   TWO-DIMENSIONAL AND DOPPLER IMAGING:  The left ventricle is normal size with  normal systolic function.  There is evidence of concentric left ventricular  hypertrophy.  Diastolic function was not assessed.  There are no obvious  wall motion abnormalities seen.   The right ventricle is normal size with normal systolic function.   Both atria appear to be normal size.   There is no evidence of an atrial septal defect.   The aortic valve is sclerotic with mild aortic insufficiency, no stenosis is  seen.   The mitral valve is mildly calcified with evidence of mild mitral  regurgitation, no stenosis is seen.   The tricuspid valve is morphologically unremarkable with trace  insufficiency, no stenosis is seen.   The pulmonic valve is not well seen.   The ascending aorta is not well seen.   Pericardial structures appear normal.                                               Scarlett Presto, M.D.    JH/MEDQ  D:  02/10/2003  T:  02/10/2003   Job:  [id number]

## 2011-04-08 NOTE — H&P (Signed)
NAME:  Nancy Medina, Nancy Medina                         ACCOUNT NO.:  [id number]   MEDICAL RECORD NO.:  [id number]                   PATIENT TYPE:  OUT   LOCATION:  RAD                                  FACILITY:  [facility]   PHYSICIAN:  Jamesetta So, M.D.               DATE OF BIRTH:  July 13, 1925   DATE OF ADMISSION:  03/11/2003  DATE OF DISCHARGE:  03/11/2003                                HISTORY & PHYSICAL   CHIEF COMPLAINT:  Left breast carcinoma.   HISTORY OF PRESENT ILLNESS:  The patient is a 77 year old white female who  recently underwent a left breast biopsy after needle localization was found  to have in invasive ductal carcinoma with tumors at the margin.  After  extensive discussion with the patient concerning all her options, she has  elected to proceed with a left modified radical mastectomy.  She does have a  daughter who was diagnosed at the age 73 with breast cancer.  She is post  menopausal.   PAST MEDICAL HISTORY:  1. Includes hypertension.  2. High cholesterol levels.   PAST SURGICAL HISTORY:  1. Right breast biopsy after needle localization in 2000.  2. Left breast biopsy after needle localization in May 2004.  3. Hysterectomy.  4. Bladder tack-up.   CURRENT MEDICATIONS:  1. Norvasc.  2. ________.  3. Plavix which she is holding.  4. Nexium.  5. Crestor.   ALLERGIES:  No known drug allergies.   REVIEW OF SYSTEMS:  Noncontributory.   PHYSICAL EXAMINATION:  GENERAL:  Patient is a well-developed, well-  nourished, white female in no acute distress.  VITAL SIGNS:  She is afebrile and vital signs are stable.  NECK:  Supple without lymphadenopathy.  LUNGS:  Clear to auscultation with equal breath sounds bilaterally.  HEART:  Reveals a regular, rate and rhythm without S3, S4 or murmurs.  BREAST:  Right breast examination reveals no dominant mass, nipple discharge  or dimpling.  The axilla is negative for palpable nodes.  Left breast  examination reveals no  dominant mass, nipple discharge or dimpling.  The  axilla is negative for palpable nodes.  A healing surgical incision site is  present in the medial aspect of the left breast.   IMPRESSION:  Left breast carcinoma.   PLAN:  The patient is scheduled for a left modified radical mastectomy on  Apr 14, 2003.  The risks and benefits of the procedure including bleeding,  infection and pain after the surgery as well as possible nerve injury were  fully explained to the patient.  I gained informed consent.                                                Jamesetta So, M.D.    MAJ/MEDQ  D:  04/01/2003  T:  04/01/2003  Job:  [id number]   cc:   Lorriane Shire  [place], [place]

## 2011-04-08 NOTE — Op Note (Signed)
NAME:  Nancy Medina, Nancy Medina                         ACCOUNT NO.:  [id number]   MEDICAL RECORD NO.:  [id number]                   PATIENT TYPE:  AMB   LOCATION:  DAY                                  FACILITY:  [facility]   PHYSICIAN:  Jamesetta So, M.D.               DATE OF BIRTH:  15-Mar-1925   DATE OF PROCEDURE:  04/14/2003  DATE OF DISCHARGE:                                 OPERATIVE REPORT   PREOPERATIVE DIAGNOSES:  Left breast carcinoma, invasive ductal.   POSTOPERATIVE DIAGNOSES:  Left breast carcinoma, invasive ductal.   PROCEDURE:  Left partial mastectomy, sentinel lymph node biopsy.   SURGEON:  Jamesetta So, M.D.   ANESTHESIA:  General endotracheal   INDICATIONS:  The patient is a 77 year old white female who underwent a left  breast biopsy after needle localization and was found to have an invasive  ductal carcinoma with tumor at the margin.  She now presents for a left  partial mastectomy and sentinel lymph node biopsy, possible axillary  dissection.  The risks and benefits of the procedure including bleeding,  infection, and nerve injury were fully explained to the patient, who gave  informed consent.   DESCRIPTION OF PROCEDURE:  The patient was placed in the supine position.  The patient had been injected 2 hours earlier with a radionuclide by  radiology.  The left breast and left axilla were prepped and draped using  the usual sterile technique with Betadine.  Surgical site confirmation was  performed.   Using the needle probe the sentinel lymph node was located.  An incision was  made into the left axilla and dissection was taken down to the sentinel  lymph node.  The initial count at the skin was 651.  In vivo count was 1756,  ex vivo was 2883.  The lymph node was blue.  This was removed without  difficulty.  It was sent to pathology for frozen section.  This was negative  for metastatic disease.  The basin count at the end was 70. No other lymph  nodes could be  palpated or identified. The left axillary incision was closed  using a 4-0 Vicryl subcuticular suture.   Next, the left partial mastectomy was performed.  An elliptical incision was  made around the incision and the left partial mastectomy was performed.  Any  bleeding was controlled using Bovie electrocautery.  The specimen was sent  to pathology for further examination.  The wound was irrigated with normal  saline.  Any bleeding was controlled using Bovie electrocautery.  The skin  was closed using 3-0 subcutaneous Vicryl and a 4-0 Vicryl subcuticular  suture.  Steri-Strips and dry sterile dressings were applied to both wounds.  Both wounds were injected with 1% Xylocaine.  All tape and needle counts  were correct at the end of the procedure.  The patient was extubated in the  operating room and went back  to recovery room in awake and stable condition.   COMPLICATIONS:  None.    SPECIMENS:  1. Sentinel lymph node.  2. Left partial mastectomy.                                               Jamesetta So, M.D.    MAJ/MEDQ  D:  04/14/2003  T:  04/14/2003  Job:  [id number]   cc:   Merrily Pew, M.D.

## 2011-04-08 NOTE — Procedures (Signed)
NAMEAVELYN, BLUNCK               ACCOUNT NO.:  [id number]   MEDICAL RECORD NO.:  [id number]          PATIENT TYPE:  OUT   LOCATION:  RAD                           FACILITY:  [facility]   PHYSICIAN:  Jacqulyn Ducking, M.D. [facility] OF BIRTH:  10-23-1925   DATE OF PROCEDURE:  03/02/2006  DATE OF DISCHARGE:                                  ECHOCARDIOGRAM   PROCEDURE:  Echocardiogram.   REFERRED BY:  Dr. Weston Settle.   CLINICAL DATA:  A 77 year old woman with dyspnea, lower extremity edema and  hypertension.  M-mode aorta 3.2, left atrium 3.2, septum 1.6, posterior wall  1.4, LV diastole 3.5, LV systole 2.6.   1.  Technically adequate echocardiographic study.  2.  Left atrial size of the upper limit of normal; normal right atrial size.  3.  Normal right ventricular size; moderate hypertrophy; mildly impaired      systolic function.  4.  Mild aortic valvular sclerosis with mild insufficiency; aortic root      mildly dilated with calcification of the proximal ascending aorta.  5.  Normal pulmonic valve and proximal pulmonary artery.  6.  Normal mitral valve; mild to moderate annular calcification; trivial      regurgitation.  7.  Normal tricuspid valve.  8.  Normal left ventricular size; mild to moderate hypertrophy with      disproportionate involvement of the proximal septum.  Normal regional      and global LV systolic function.  9.  Normal IVC.  10. Comparison with prior study of February 10, 2003:  RVH and decreased RV      function now present.      Jacqulyn Ducking, M.D. [facility]  Electronically Signed     RR/MEDQ  D:  03/02/2006  T:  03/03/2006  Job:  [id number]

## 2011-04-08 NOTE — Discharge Summary (Signed)
[facility]  Patient:    Nancy Medina, Nancy Medina Visit Number: [id number] MRN: [id number]          Service Type: Attending:  Tania Ade, M.D. Dictated by:   Tania Ade, M.D.                             Discharge Summary  DATE OF BIRTH:  [birth year]  HISTORY OF PRESENT ILLNESS:  The patient is a 77 year old white female, gravida 2, para 2, who is admitted for a TVH, BSO, anterior and posterior colporrhaphy with a transvaginal urethropexy, sling by Dr. Maryland Pink, and a vaginal vault suspension.  Patient has severe prolapse of the cystocele, a moderate uterine prolapse, and a minimal rectocele.  She also has mixed urinary incontinence and is status post urodynamic testing by Dr. Maryland Pink which confirmed the mix.  She has been placed on Detrol LA but, of course, continues to have a stress urinary incontinence.  As a result, Dr. Maryland Pink and I are doing a combined procedure on June 12.  PAST MEDICAL HISTORY:  Significant for hypertension.  PAST SURGICAL HISTORY:  Gallbladder around 53 at [facility].  PAST OBSTETRICAL HISTORY:  Two vaginal deliveries.  ALLERGIES:  None.  MEDICATIONS: 1. Norvasc 5. 2. Maxzide. 3. Nexium and Xanax as needed.  REVIEW OF SYSTEMS:  Otherwise negative.  PHYSICAL EXAMINATION:  VITAL SIGNS:  Blood pressure is 110/80.  HEENT:  Unremarkable.  NECK:  Thyroid is normal.  LUNGS:  Clear.  HEART:  Regular rate and rhythm without murmur, regurge, or gallops.  BREASTS:  Without mass, discharge, skin changes.  ABDOMEN:  Benign.  No hepatosplenomegaly or masses.  GENITOURINARY:  She has normal external genitalia.  Vagina is pink and moist without discharge.  She has a severe cystocele, a moderate uterine prolapse and a minimal rectocele.  She does have what I think is significant vaginal prolapse, as well, possibly enterocele.  The patient is not planning on having intercourse in the future.  She is not currently.   Pelvic otherwise is normal. Adnexa is not palpable.  EXTREMITIES:  Warm with no edema.  NEUROLOGIC:  Exam is grossly intact.  IMPRESSION: 1. Severe cystocele. 2. Mixed urinary incontinence. 3. Moderate uterine prolapse. 4. Vaginal vault prolapse. 5. Minimal rectocele.  PLAN:  The patient is admitted for total vaginal hysterectomy, bilateral salpingo-oophorectomy, anterior and posterior colporrhaphy, a vaginal sling procedure, and a vaginal vault suspension to the sacrospinous ligament. Patient understands the risks, benefits, indications, and alternatives, and will proceed. Dictated by:   Tania Ade, M.D. Attending:  Tania Ade, M.D. DD:  05/01/02 TD:  05/01/02 Job: [id number]

## 2011-04-08 NOTE — H&P (Signed)
   NAME:  Nancy Medina, Nancy Medina                         ACCOUNT NO.:  [id number]   MEDICAL RECORD NO.:  [id number]                   PATIENT TYPE:  OUT   LOCATION:  RAD                                  FACILITY:  [facility]   PHYSICIAN:  Jamesetta So, M.D.               DATE OF BIRTH:  1925-09-09   DATE OF ADMISSION:  03/17/2003  DATE OF DISCHARGE:  03/17/2003                                HISTORY & PHYSICAL   CHIEF COMPLAINT:  Left breast mass.   HISTORY OF PRESENT ILLNESS:  The patient is a 77 year old white female who  was referred for evaluation and treatment of a left breast mass.  Was seen  on routine mammography.  It is not palpable.  No family history of breast  carcinoma or nipple discharge is noted.  She has a daughter who was  diagnosed at the age of 68 with breast cancer.  She has had two children,  first pregnant at age 78, never breast fed.  She is postmenopausal.   PAST MEDICAL HISTORY:  1. Hypertension.  2. High cholesterol levels.   PAST SURGICAL HISTORY:  1. Right breast biopsy after needle localization in 2000.  2. Hysterectomy.  3. Bladder tack up.   CURRENT MEDICATIONS:  Norvasc, Atacand, Plavix which she is holding, Nexium,  baby aspirin, Crestor.   ALLERGIES:  No known drug allergies.   REVIEW OF SYSTEMS:  Noncontributory.   PHYSICAL EXAMINATION:  GENERAL:  The patient is a well developed, well  nourished white female in no acute distress.  VITAL SIGNS:  She is afebrile, vital signs stable.  NECK:  Supple without lymphadenopathy.  LUNGS:  Clear to auscultation with equal breath sounds bilaterally.  HEART:  Regular rate and rhythm without S3, S4, or murmurs.  BREASTS:  Right breast examination reveals no dominant mass, nipple  discharge, or dimpling.  The axilla negative for palpable nodes.  Left  breast examination reveals no dominant mass, nipple discharge, or dimpling.  The axilla is negative for palpable nodes.   LABORATORY DATA:  Mammogram reveals  spiculated 7 mm mass at the 9 o'clock  position in the left breast.   IMPRESSION:  Left breast mass, nonpalpable.    PLAN:  The patient is scheduled for left breast biopsy after needle  localization on 03/25/2003.  The risks and benefits of the procedure including  bleeding and infection were fully explained to the patient, gave informed  consent.                                               Jamesetta So, M.D.    MAJ/MEDQ  D:  03/20/2003  T:  03/20/2003  Job:  [id number]   cc:   Dr. Derrick Ravel

## 2011-04-08 NOTE — Op Note (Signed)
[facility]  Patient:    Nancy Medina, Nancy Medina Visit Number: [id number] MRN: [id number]          Service Type: MED Location: 3A A330 01 Attending Physician:  Florian Buff Dictated by:   Tania Ade, M.D. Proc. Date: 05/02/02 Admit Date:  05/02/2002                             Operative Report  PREOPERATIVE DIAGNOSES:  1. Severe cystocele.  2. Mixed urinary incontinence.  3. Moderate uterine prolapse.  4. Mild rectal prolapse.  POSTOPERATIVE DIAGNOSES:  1. Severe cystocele.  2. Mixed urinary incontinence.  3. Moderate uterine prolapse.  4. Mild rectal prolapse.  PROCEDURE:  TVH, anterior and posterior colporrhaphy, and a TVT by Dr. Maryland Pink.  SURGEON:  Tania Ade, M.D.  ASSISTANT:  Miguel Dibble, M.D.  FINDINGS:  The patient had as noted previously a severe cystocele, really did not have much urethral prolapse but did have hypermobility. She had stress and urge incontinence mix. She was on Detrol LA 4 mg a day but also had stress incontinence. Her urge incontinence is responding to that. She also had moderate uterine prolapse and mild rectal prolapse.  DESCRIPTION OF PROCEDURE:  The patient was taken to the operating room, placed in the supine position where she underwent general endotracheal anesthesia. She was then placed in dorsal lithotomy position, prepped and draped in the usual sterile fashion for vaginal surgery. A Foley catheter was placed for continuous bladder drainage. A weighted speculum was placed. The cervix was grasped anteriorly and posteriorly. Electrocautery unit was used and the anterior and posterior vagina were pushed off the lower uterine segment. The posterior cul-de-sac was entered sharply. The uterosacral ligaments were clamped, cut, transected and suture ligated and held bilaterally. The cardinal ligaments were clamped, cut, transfixion suture ligated and cut. The anterior cul-de-sac was entered, the anterior and  posterior leaves of the broad ligament were plicated and the uterine vessels were clamped, cut and suture ligated and cut. Serial pedicles were taken up the fundus, each pedicle being clamped, cut, transfixion suture ligated and cut. The utero-ovarian ligaments were then cross clamped, double suture ligated and specimen was removed. The ovaries could not be identified on either side either visually or by palpation. They just must have been so atrophic that they could not be found. As a result, the peritoneum was then closed in a pursestring fashion, all pedicles found to be hemostatic. The pelvis was irrigated vigorously. The vagina was closed from right to left with interrupted figure-of-eight sutures but leaving space in the middle for the anterior repair. The vagina was then grasped in the midline and dissected off the bladder sharply and then bluntly and the vesicovaginal space was opened completely. Dr. Maryland Pink then performed the tension free graft placement for the urethral sling and please refer to his dictation for details of that. The pubocervical fascia was then plicated in the midline, the excess vagina was trimmed away and the vagina was closed with interrupted sutures with good hemostasis.  Attention was then turned to the posterior repair. The mucocutaneous junction was excised, the posterior vagina was then dissected off the rectum and the rectovaginal space opened. The rectum was dissected off completely and the right sacrospinous ligament was identified; however, she had such a small funneling pelvis this really could not be performed. I really did not even have room to place the needle much less even regrasp the  suture. As a result since the patient is not going to have sex in the future, I decided to do more of a colpocleisis for prevention of future vaginal prolapse. The fascia was then plicated in the midline, the excess vagina was trimmed away and the vagina was  closed with interrupted sutures. A small perineoplasty was then performed at the mucocutaneous junction. There was good hemostasis of all pedicles. The patient tolerated the procedure well, she was awakened from anesthesia and taken to the recovery room in good stable condition. All sponge, needle and instrument counts were correct. She received Ancef prophylactically. She received Toradol preoperatively as well. She had Flowtrons on during the procedure and estimated blood loss 250 cc.Dictated by: Tania Ade, M.D. Attending Physician:  Florian Buff DD:  05/02/02 TD:  05/04/02 Job: [id number]

## 2011-04-08 NOTE — Discharge Summary (Signed)
[facility]  Patient:    Nancy Medina, SURGEON Visit Number: [id number] MRN: [id number]          Service Type: MED Location: 3A A330 01 Attending Physician:  Florian Buff Dictated by:   Tania Ade, M.D. Admit Date:  05/02/2002 Discharge Date: 05/05/2002                             Discharge Summary  DISCHARGE DIAGNOSES: 1. Status post total vaginal hysterectomy and anterior and posterior repair by    Tania Ade, M.D. 2. Status post a transvaginal tension-free sling by Miguel Dibble, M.D. 3. Unremarkable postoperative course.  PROCEDURES: 1. Total vaginal hysterectomy and anterior and posterior colporrhaphy by    Tania Ade, M.D. 2. Transvaginal tension-free sling by Miguel Dibble, M.D.  HISTORY OF PRESENT ILLNESS:  Please refer to the transcribed history and physical for details of admission to the hospital.  Also refer to the operative report.  HOSPITAL COURSE:  The patient was admitted postoperatively.  Surgically everything went well.  She had an unremarkable intraoperative course.  She immediately tolerated clear liquids and then a regular diet.  She had her Foley catheter removed 24 hours after surgery and voided without any symptoms whatsoever.  She was ambulatory really without any pain, taking just around the clock Toradol and had no nausea.  Her abdomen was benign.  Her postoperative hemoglobin and hematocrit were stable at 9.1 and 26.8, respectively, on postoperative day #1 and then 9.8 and 28.1, respectively, on postoperative day #2 and 10.1 and 28.9, respectively on postoperative day #3.  DISPOSITION:  She was discharged to home on the afternoon of May 05, 2002.  FOLLOW-UP:  To be seen in the office next week for evaluation.  She was given instruction precautions for return.  DISCHARGE MEDICATIONS:  She was given Toradol for pain and Tylox for pain as well if she needed it. Dictated by:   Tania Ade, M.D. Attending  Physician:  Florian Buff DD:  05/13/02 TD:  05/14/02 Job: [id number]

## 2011-04-08 NOTE — Op Note (Signed)
   NAME:  Nancy Medina, Nancy Medina                         ACCOUNT NO.:  [id number]   MEDICAL RECORD NO.:  [id number]                   PATIENT TYPE:  AMB   LOCATION:  DAY                                  FACILITY:  [facility]   PHYSICIAN:  Jamesetta So, M.D.               DATE OF BIRTH:  07/29/1925   DATE OF PROCEDURE:  03/24/2003  DATE OF DISCHARGE:                                 OPERATIVE REPORT   PREOPERATIVE DIAGNOSIS:  Left breast mass, nonpalpable.   POSTOPERATIVE DIAGNOSIS:  Left breast mass, nonpalpable.   OPERATION/PROCEDURE:  Left breast biopsy after needle localization.   SURGEON:  Jamesetta So, M.D.   ANESTHESIA:  MAC.   INDICATIONS:  The patient is a 77 year old white female who presents with an  abnormal mammogram of the left breast.  The patient now comes to the  operating room for left breast biopsy after needle localization.  The risks  and benefits of the procedure including bleeding and infection were fully  explained to the patient, who gave informed consent.   DESCRIPTION OF PROCEDURE:  The patient was placed in the supine position  after undergoing needle localization in the x-ray department.  The left  breast was prepped and draped using the usual sterile technique with  Betadine.  Surgical site confirmation was performed.   The needle was located in the outer lower quadrant of the left breast.  One  percent Xylocaine was used for local anesthesia.  A small incision was made  and the dissection was followed down the wire to its tip.  Left breast  biopsy was performed.  This was sent to radiology and specimen radiography  revealed the suspicious lesion to be within the specimen removed.  Any  bleeding was controlled using Bovie electrocautery.  The wound was irrigated  with normal saline.  The skin was closed using a 4-0 Vicryl  subcuticular suture.  Steri-Strips and dry sterile dressing were applied.   All tape and needle counts correct at the end of the  procedure.  The patient  was transferred to PACU in stable condition.  Complications - none.  Specimen - left breast biopsy.  Blood loss minimal.                                               Jamesetta So, M.D.    MAJ/MEDQ  D:  03/24/2003  T:  03/24/2003  Job:  [id number]   cc:   Lucia Gaskins, M.D.  [place]

## 2011-05-27 ENCOUNTER — Other Ambulatory Visit (HOSPITAL_COMMUNITY): Payer: Self-pay | Admitting: Family Medicine

## 2011-05-29 ENCOUNTER — Encounter (HOSPITAL_COMMUNITY): Payer: Self-pay | Admitting: Family Medicine

## 2011-05-31 ENCOUNTER — Ambulatory Visit (HOSPITAL_COMMUNITY)
Admission: RE | Admit: 2011-05-31 | Discharge: 2011-05-31 | Disposition: A | Discharge status: Home / Self Care | Payer: Medicare Other | Source: Ambulatory Visit | Attending: Family Medicine | Admitting: Family Medicine

## 2011-05-31 DIAGNOSIS — R1310 Dysphagia, unspecified: Secondary | ICD-10-CM | POA: Insufficient documentation

## 2011-05-31 DIAGNOSIS — K224 Dyskinesia of esophagus: Secondary | ICD-10-CM | POA: Insufficient documentation

## 2011-06-01 LAB — BASIC METABOLIC PANEL
BUN: 28 mg/dL — AB (ref 4–21)
Calcium: 9.9 mg/dL
Glucose: 144 mg/dL
Potassium: 4.7 mmol/L

## 2011-06-01 LAB — CBC WITH DIFFERENTIAL/PLATELET: MCV: 89.9 fL

## 2011-06-15 ENCOUNTER — Ambulatory Visit (INDEPENDENT_AMBULATORY_CARE_PROVIDER_SITE_OTHER): Payer: Medicare Other | Admitting: Gastroenterology

## 2011-06-15 ENCOUNTER — Encounter: Payer: Self-pay | Admitting: Gastroenterology

## 2011-06-15 VITALS — BP 121/69 | HR 72 | Temp 98.0°F | Ht 64.0 in | Wt 190.2 lb

## 2011-06-15 DIAGNOSIS — K52832 Lymphocytic colitis: Secondary | ICD-10-CM | POA: Insufficient documentation

## 2011-06-15 DIAGNOSIS — R1314 Dysphagia, pharyngoesophageal phase: Secondary | ICD-10-CM

## 2011-06-15 DIAGNOSIS — R1310 Dysphagia, unspecified: Secondary | ICD-10-CM | POA: Insufficient documentation

## 2011-06-15 DIAGNOSIS — K219 Gastro-esophageal reflux disease without esophagitis: Secondary | ICD-10-CM

## 2011-06-15 DIAGNOSIS — D649 Anemia, unspecified: Secondary | ICD-10-CM

## 2011-06-15 DIAGNOSIS — K5289 Other specified noninfective gastroenteritis and colitis: Secondary | ICD-10-CM

## 2011-06-15 DIAGNOSIS — R1319 Other dysphagia: Secondary | ICD-10-CM

## 2011-06-15 MED ORDER — BISMUTH SUBSALICYLATE 262 MG PO CHEW: CHEWABLE_TABLET | ORAL | Status: DC

## 2011-06-15 NOTE — Progress Notes (Signed)
PEPTO BISMOL 4 TABS BID FOR 8 WEEKS. Agree with EGD/?DIL

## 2011-06-15 NOTE — Progress Notes (Signed)
Cc to PCP 

## 2011-06-15 NOTE — Assessment & Plan Note (Signed)
Chronic diarrhea. Discuss with Dr. Oneida Alar. ?resume Lialda vs 8 week course of Pepto-Bismol. Further recommendations to follow.

## 2011-06-15 NOTE — Assessment & Plan Note (Signed)
Chronic anemia dating back to 2010 at least. At that time her B12/folate, iron, ferritin were all normal. Likely anemia of chronic disease. Last TCS 2008. EGD in near future for dysphagia/gerd. Await EGD findings.

## 2011-06-15 NOTE — Progress Notes (Signed)
Primary Care Physician:  Robert Bellow, MD  Primary Gastroenterologist:  Barney Drain, MD   Chief Complaint  Patient presents with  . Dysphagia    HPI:  Nancy Medina is a 77 y.o. female here for further evaluation of dysphagia. She complains dysphagia for two months. Mostly has difficulty with bread and soft foods such as grits. Pills and meat okay. Sometimes food becomes lodged and she tries to wash it down with liquid. This causes pain and vomiting. Refractory heartburn on Nexium daily.  No abdominal pain. BM every day. Sometimes don't make it to bathroom. Has a history of lymphocytic colitis diagnosed in 2008. Initially she was tried only all the 1.2 g twice a day. Several months later she discontinued in the medication but was doing well so was not resumed. She complains of 2-3 loose stools daily. Never has a solid stool. Has to take imodium when going out because of problems with incontinence.  No nocturnal BM even when gets up to urinates. No melena or brbpr. No recent abx use. No well water. No recent travel or ill contacts.    Current Outpatient Prescriptions  Medication Sig Dispense Refill  . amLODipine (NORVASC) 10 MG tablet Take 10 mg by mouth daily.        Marland Kitchen aspirin 81 MG tablet Take 81 mg by mouth daily.        . candesartan-hydrochlorothiazide (ATACAND HCT) 32-12.5 MG per tablet Take 1 tablet by mouth daily.        . Cholecalciferol (VITAMIN D-1000 MAX ST) 1000 UNITS tablet Take 1,000 Units by mouth daily.        . citalopram (CELEXA) 20 MG tablet Take 20 mg by mouth daily.        . clonazePAM (KLONOPIN) 1 MG tablet Take 1 mg by mouth 2 (two) times daily as needed.        . Cyanocobalamin (VITAMIN B-12 PO) Take 1,500 mg by mouth.        . esomeprazole (NEXIUM) 40 MG capsule Take 40 mg by mouth daily before breakfast.        . furosemide (LASIX) 20 MG tablet 20 mg daily.       Marland Kitchen glimepiride (AMARYL) 4 MG tablet 4 mg daily.       Marland Kitchen latanoprost (XALATAN) 0.005 % ophthalmic  solution       . losartan (COZAAR) 100 MG tablet       . meclizine (ANTIVERT) 25 MG tablet Take 25 mg by mouth 3 (three) times daily as needed.        . metoprolol (TOPROL-XL) 100 MG 24 hr tablet Take 100 mg by mouth daily. 1/2 tablet       . Multiple Vitamin (MULTIVITAMIN) capsule Take 1 capsule by mouth daily.        . naproxen (NAPROSYN) 500 MG tablet Take 500 mg by mouth 2 (two) times daily with a meal. Patient does not take frequently.      . Calcium Carbonate-Vit D-Min (CALTRATE 600+D PLUS) 600-400 MG-UNIT per tablet Take 1 tablet by mouth daily.        . fish oil-omega-3 fatty acids 1000 MG capsule Take 2 g by mouth daily.        . pravastatin (PRAVACHOL) 40 MG tablet Take 40 mg by mouth daily.          Allergies as of 06/15/2011 - Review Complete 06/15/2011  Allergen Reaction Noted  . Metformin  10/14/2008    Past Medical History  Diagnosis Date  . Renal disease     stage III  . Unspecified fall   . Allergic rhinitis   . Achilles tendinitis   . DJD (degenerative joint disease)   . Leg pain   . DM (diabetes mellitus)     type II  . Knee pain, left   . Benign paroxysmal positional vertigo   . Obesity   . Cataract   . Unspecified glaucoma   . Hypertonicity of bladder   . Cerebrovascular accident   . Osteoporosis, unspecified   . HTN (hypertension)   . Hyperlipidemia   . GERD (gastroesophageal reflux disease)   . Depression   . HX: breast cancer   . Anxiety   . Chronic kidney disease (CKD), stage III (moderate)   . Anemia of chronic disease     at least back to 2010  . Lymphocytic colitis 2008    treated with Lialda short-term    Past Surgical History  Procedure Date  . Bladder tack 2000  . Cholecystectomy 1960  . S/p hysterectomy 2000    benign reasons  . Breast lumpectomy 2004    right  . Cataracts   . Colonoscopy 07/2007    lymphocytic colitis    Family History  Problem Relation Age of Onset  . Colon cancer Neg Hx        . Liver disease Neg Hx          . GI problems Neg Hx          History   Social History  . Marital Status: Widowed    Spouse Name: N/A    Number of Children: N/A  . Years of Education: N/A   Occupational History  . retired     [employer]   Social History Main Topics  . Smoking status: Former Smoker -- 1.0 packs/day    Types: Cigarettes  . Smokeless tobacco: Not on file   Comment: 30 yrs ago  . Alcohol Use: No  . Drug Use: No  . Sexually Active: Not on file   Other Topics Concern  . Not on file   Social History Narrative  . No narrative on file      ROS:  General: Negative for anorexia, weight loss, fever, chills, fatigue, weakness. Eyes: Negative for vision changes.  ENT: Negative for hoarseness, nasal congestion. CV: Negative for chest pain, angina, palpitations, dyspnea on exertion, peripheral edema.  Respiratory: Negative for dyspnea at rest, dyspnea on exertion, cough, sputum, wheezing.  GI: See history of present illness. GU:  Negative for dysuria, hematuria, urinary incontinence, urinary frequency. C/O nocturnal urination.  MS: Negative for low back pain. Chronic right ankle pain.  Derm: Negative for rash or itching.  Neuro: Negative for weakness, abnormal sensation, seizure, frequent headaches, memory loss, confusion.  Psych: Negative for anxiety, depression, suicidal ideation, hallucinations.  Endo: Negative for unusual weight change.  Heme: Negative for bruising or bleeding. Allergy: Negative for rash or hives.    Physical Examination:  BP 121/69  Pulse 72  Temp(Src) 98 F (36.7 C) (Temporal)  Ht 5\' 4"  (1.626 m)  Wt 190 lb 3.2 oz (86.274 kg)  BMI 32.65 kg/m2   General: Well-nourished, well-developed in no acute distress.  Head: Normocephalic, atraumatic.   Eyes: Conjunctiva pink, no icterus. Mouth: Oropharyngeal mucosa moist and pink , no lesions erythema or exudate. Neck: Supple without thyromegaly, masses, or lymphadenopathy.  Lungs: Clear to  auscultation bilaterally.  Heart: Regular rate and rhythm, no murmurs rubs  or gallops.  Abdomen: Bowel sounds are normal, nontender, nondistended, no hepatosplenomegaly or masses, no abdominal bruits or    hernia , no rebound or guarding.   Rectal: Not performed. Extremities: No lower extremity edema without clubbing or deformities.  Neuro: Alert and oriented x 4 , grossly normal neurologically.  Skin: Warm and dry, no rash or jaundice.   Psych: Alert and cooperative, normal mood and affect.  Labs: Labs from 05/31/2011, white blood cell count 7500, hemoglobin 11.4, hematocrit 34.7, MCV 89.9, platelets 236,000, sodium 140, potassium 4.7, glucose 144, BUN 28, creatinine 1.36, hemoglobin A1c 6.8.  Imaging Studies: Dg Esophagus  05/31/2011  *RADIOLOGY REPORT*  Clinical Data: Dysphagia with solids, history reflux  BARIUM SWALLOW/ESOPHAGRAM:  Technique:  Single contrast, air contrast, and tablet imaging of the esophagus were performed.  Fluoroscopy time:  2.9 minutes  Comparison: None  Findings: Normal esophageal distention. No esophageal mass or stricture. 12.5 mm diameter barium tablet transiently delayed at the gastroesophageal junction before passing with a barium wash. Small to moderate-sized sliding hiatal hernia is identified. Esophageal mucosa appears smooth on air-contrast images without irregularity or ulceration. No persistent intraluminal filling defects identified. Diffuse impairment of esophageal motility with incomplete clearance of barium by primary peristaltic waves. This is most evident on RAO prone images. Scattered secondary and tertiary waves are identified, as well as intermittent retrograde peristalsis. Targeted rapid sequence imaging of the cervical esophagus and hypopharynx is unremarkable.  IMPRESSION: Age-related esophageal dysmotility. Small to moderate-sized sliding hiatal hernia. No definite evidence of esophageal mass or obstruction.  Original Report Authenticated By: Burnetta Sabin, M.D.

## 2011-06-15 NOTE — Assessment & Plan Note (Signed)
Refractory heartburn on Nexium 40 mg daily chronically. No prior EGD. Also with dysphagia to solid foods and episodes of food becoming lodged with resulting vomiting to alleviate symptoms. No obvious stricture on recent barium esophagram however barium tablet was delayed at the GE junction. Question subtle ring or stricture. Recommend EGD with possible dilation for further evaluation. Discussed with patient that some of her dysphagia is likely due to age-related esophageal dysmotility. She request esophageal dilation if appropriate at time of EGD.  I have discussed the risks, alternatives, benefits with regards to but not limited to the risk of reaction to medication, bleeding, infection, perforation and the patient is agreeable to proceed. Written consent to be obtained.

## 2011-06-15 NOTE — Assessment & Plan Note (Signed)
Refer to assessment and plan for GERD.

## 2011-06-15 NOTE — Progress Notes (Signed)
Addended by: Mahala Menghini on: 06/15/2011 05:04 PM   Modules accepted: Orders

## 2011-06-15 NOTE — Progress Notes (Signed)
Please let patient know [facility] recommendations regarding diarrhea. PEPTO BISMOL 4 TABS BID FOR 8 WEEKS.

## 2011-06-16 NOTE — Progress Notes (Signed)
Pt informed

## 2011-06-16 NOTE — Progress Notes (Signed)
LMOM for pt to call. 

## 2011-06-24 ENCOUNTER — Encounter: Payer: Medicare Other | Admitting: Gastroenterology

## 2011-06-24 ENCOUNTER — Ambulatory Visit (HOSPITAL_COMMUNITY)
Admission: RE | Admit: 2011-06-24 | Discharge: 2011-06-24 | Disposition: A | Discharge status: Home / Self Care | Payer: Medicare Other | Source: Ambulatory Visit | Attending: Gastroenterology | Admitting: Gastroenterology

## 2011-06-24 ENCOUNTER — Encounter (HOSPITAL_COMMUNITY)
Admission: RE | Disposition: A | Discharge status: Home / Self Care | Payer: Self-pay | Source: Ambulatory Visit | Attending: Gastroenterology

## 2011-06-24 DIAGNOSIS — D649 Anemia, unspecified: Secondary | ICD-10-CM | POA: Insufficient documentation

## 2011-06-24 DIAGNOSIS — K208 Other esophagitis: Secondary | ICD-10-CM

## 2011-06-24 DIAGNOSIS — K222 Esophageal obstruction: Secondary | ICD-10-CM

## 2011-06-24 DIAGNOSIS — R1310 Dysphagia, unspecified: Secondary | ICD-10-CM | POA: Insufficient documentation

## 2011-06-24 DIAGNOSIS — E119 Type 2 diabetes mellitus without complications: Secondary | ICD-10-CM | POA: Insufficient documentation

## 2011-06-24 DIAGNOSIS — Z7982 Long term (current) use of aspirin: Secondary | ICD-10-CM | POA: Insufficient documentation

## 2011-06-24 HISTORY — PX: ESOPHAGOGASTRODUODENOSCOPY: SHX5428

## 2011-06-24 HISTORY — PX: SAVORY DILATION: SHX5439

## 2011-06-24 SURGERY — EGD (ESOPHAGOGASTRODUODENOSCOPY)
Anesthesia: Moderate Sedation

## 2011-06-24 MED ORDER — MEPERIDINE HCL 100 MG/ML IJ SOLN
INTRAMUSCULAR | Status: DC | PRN
  Administered 2011-06-24: 50 mg via INTRAVENOUS

## 2011-06-24 MED ORDER — PROMETHAZINE HCL 25 MG/ML IJ SOLN
INTRAMUSCULAR | Status: DC | PRN
  Administered 2011-06-24: 12.5 mg via INTRAVENOUS

## 2011-06-24 MED ORDER — PROMETHAZINE HCL 25 MG/ML IJ SOLN
INTRAMUSCULAR | Status: DC
Start: 2011-06-24 — End: 2011-06-24
  Filled 2011-06-24: qty 1

## 2011-06-24 MED ORDER — BUTAMBEN-TETRACAINE-BENZOCAINE 2-2-14 % EX AERO
INHALATION_SPRAY | CUTANEOUS | Status: DC | PRN
  Administered 2011-06-24: 2 via TOPICAL

## 2011-06-24 MED ORDER — MINERAL OIL PO OIL
TOPICAL_OIL | ORAL | Status: AC
  Filled 2011-06-24: qty 30

## 2011-06-24 MED ORDER — MEPERIDINE HCL 100 MG/ML IJ SOLN
INTRAMUSCULAR | Status: AC
  Filled 2011-06-24: qty 2

## 2011-06-24 MED ORDER — SODIUM CHLORIDE 0.45 % IV SOLN
Freq: Once | INTRAVENOUS | Status: AC
  Administered 2011-06-24: 10:00:00 via INTRAVENOUS

## 2011-06-24 MED ORDER — MIDAZOLAM HCL 5 MG/5ML IJ SOLN
INTRAMUSCULAR | Status: AC
  Filled 2011-06-24: qty 10

## 2011-06-24 MED ORDER — MIDAZOLAM HCL 5 MG/5ML IJ SOLN
INTRAMUSCULAR | Status: DC | PRN
  Administered 2011-06-24: 2 mg via INTRAVENOUS
  Administered 2011-06-24: 1 mg via INTRAVENOUS

## 2011-06-24 MED ORDER — ESOMEPRAZOLE MAGNESIUM 40 MG PO CPDR: DELAYED_RELEASE_CAPSULE | ORAL | Status: DC

## 2011-06-24 NOTE — H&P (Addendum)
  IN [facility] 7/25

## 2011-06-24 NOTE — Interval H&P Note (Signed)
History and Physical Interval Note:   06/24/2011   10:03 AM   Nancy Medina  has presented today for surgery, with the diagnosis of dysphagia, gerd, microscopic colitis  The various methods of treatment have been discussed with the patient and family. After consideration of risks, benefits and other options for treatment, the patient has consented to  Procedure(s): ESOPHAGOGASTRODUODENOSCOPY (EGD) SAVORY DILATION as a surgical intervention .  I have reviewed the patients' chart and labs.  Questions were answered to the patient's satisfaction.     Barney Drain  MD

## 2011-07-01 ENCOUNTER — Encounter (HOSPITAL_COMMUNITY): Payer: Self-pay | Admitting: Gastroenterology

## 2011-07-12 ENCOUNTER — Other Ambulatory Visit (HOSPITAL_COMMUNITY): Payer: Self-pay | Admitting: Family Medicine

## 2011-07-12 DIAGNOSIS — Z9889 Other specified postprocedural states: Secondary | ICD-10-CM

## 2011-07-27 ENCOUNTER — Ambulatory Visit (HOSPITAL_COMMUNITY): Payer: Medicare Other

## 2011-08-01 ENCOUNTER — Ambulatory Visit (HOSPITAL_COMMUNITY): Payer: Medicare Other

## 2011-08-08 ENCOUNTER — Ambulatory Visit (HOSPITAL_COMMUNITY): Payer: Medicare Other

## 2011-09-06 LAB — CANCER ANTIGEN 27.29: CA 27.29: 31

## 2011-09-06 LAB — CBC
HCT: 34.7 — ABNORMAL LOW
Hemoglobin: 11.8 — ABNORMAL LOW
RBC: 3.94
WBC: 5.3

## 2011-09-26 ENCOUNTER — Ambulatory Visit (INDEPENDENT_AMBULATORY_CARE_PROVIDER_SITE_OTHER): Payer: Medicare Other | Admitting: Gastroenterology

## 2011-09-26 ENCOUNTER — Encounter: Payer: Self-pay | Admitting: Gastroenterology

## 2011-09-26 DIAGNOSIS — K5289 Other specified noninfective gastroenteritis and colitis: Secondary | ICD-10-CM

## 2011-09-26 DIAGNOSIS — K219 Gastro-esophageal reflux disease without esophagitis: Secondary | ICD-10-CM

## 2011-09-26 DIAGNOSIS — K52832 Lymphocytic colitis: Secondary | ICD-10-CM

## 2011-09-26 MED ORDER — PANTOPRAZOLE SODIUM 40 MG PO TBEC: 40.0000 mg | DELAYED_RELEASE_TABLET | Freq: Two times a day (BID) | ORAL | Status: DC

## 2011-09-26 MED ORDER — BUDESONIDE 3 MG PO CP24: ORAL_CAPSULE | ORAL | Status: DC

## 2011-09-26 NOTE — Progress Notes (Signed)
Primary Care Physician: Robert Bellow, MD  Primary Gastroenterologist:  Barney Drain, MD   Chief Complaint  Patient presents with  . Diarrhea    HPI: Nancy Medina is a 77 y.o. female here for followup. Her dysphagia has resolved status post dilation. She had EGD back in August which showed a peptic stricture, mild erosive reflux esophagitis, hiatal hernia. She tried Pepto-Bismol regimen for microscopic colitis but states it only made things worse. BM five before 10am daily. Urgency. Nonbloody. Mucous in stools. Afraid to go anywhere. Takes Imodium at times. No abdominal pain, no weight loss. Appetite good. Dysphagia better. Does not take amaryl regularly. Rarely takes Naprosyn.  Current Outpatient Prescriptions  Medication Sig Dispense Refill  . amLODipine (NORVASC) 10 MG tablet Take 10 mg by mouth daily.        Marland Kitchen aspirin 81 MG tablet Take 81 mg by mouth daily.        . Calcium Carbonate-Vit D-Min (CALTRATE 600+D PLUS) 600-400 MG-UNIT per tablet Take 1 tablet by mouth daily.        . Cholecalciferol (VITAMIN D-1000 MAX ST) 1000 UNITS tablet Take 1,000 Units by mouth daily.        . citalopram (CELEXA) 20 MG tablet Take 20 mg by mouth daily.        . clonazePAM (KLONOPIN) 1 MG tablet Take 1 mg by mouth 2 (two) times daily as needed.        . Cyanocobalamin (VITAMIN B-12 PO) Take 1,500 mg by mouth.        . fish oil-omega-3 fatty acids 1000 MG capsule Take 2 g by mouth daily.        . furosemide (LASIX) 20 MG tablet 20 mg daily.       Marland Kitchen glimepiride (AMARYL) 4 MG tablet 4 mg daily.       Marland Kitchen latanoprost (XALATAN) 0.005 % ophthalmic solution       . losartan (COZAAR) 100 MG tablet Take 100 mg by mouth daily.       . meclizine (ANTIVERT) 25 MG tablet Take 25 mg by mouth 3 (three) times daily as needed.        . metoprolol (TOPROL-XL) 100 MG 24 hr tablet Take 100 mg by mouth daily. 1/2 tablet       . Multiple Vitamin (MULTIVITAMIN) capsule Take 1 capsule by mouth daily.        .  budesonide (ENTOCORT EC) 3 MG 24 hr capsule 3 capsules po daily for six weeks, then 2 capsules po daily for two weeks, then 1 capsule daily for two weeks, then stop.  90 capsule  1  . candesartan-hydrochlorothiazide (ATACAND HCT) 32-12.5 MG per tablet Take 1 tablet by mouth daily.        . pantoprazole (PROTONIX) 40 MG tablet Take 1 tablet (40 mg total) by mouth 2 (two) times daily.  60 tablet  5    Allergies as of 09/26/2011 - Review Complete 09/26/2011  Allergen Reaction Noted  . Metformin  10/14/2008    ROS:  General: Negative for anorexia, weight loss, fever, chills, fatigue, weakness. ENT: Negative for hoarseness, difficulty swallowing , nasal congestion. CV: Negative for chest pain, angina, palpitations, dyspnea on exertion, peripheral edema.  Respiratory: Negative for dyspnea at rest, dyspnea on exertion, cough, sputum, wheezing.  GI: See history of present illness. GU:  Negative for dysuria, hematuria, urinary incontinence, urinary frequency, nocturnal urination.  Endo: Negative for unusual weight change.    Physical Examination:   BP 139/83  Pulse 76  Temp(Src) 97.6 F (36.4 C) (Temporal)  Ht 5\' 4"  (1.626 m)  Wt 186 lb 12.8 oz (84.732 kg)  BMI 32.06 kg/m2  General: Well-nourished, well-developed in no acute distress.  Eyes: No icterus. Mouth: Oropharyngeal mucosa moist and pink , no lesions erythema or exudate. Lungs: Clear to auscultation bilaterally.  Heart: Regular rate and rhythm, no murmurs rubs or gallops.  Abdomen: Bowel sounds are normal, nontender, nondistended, no hepatosplenomegaly or masses, no abdominal bruits or hernia , no rebound or guarding.   Extremities: No lower extremity edema. No clubbing or deformities. Neuro: Alert and oriented x 4   Skin: Warm and dry, no jaundice.   Psych: Alert and cooperative, normal mood and affect.

## 2011-09-26 NOTE — Progress Notes (Signed)
Cc to PCP 

## 2011-09-26 NOTE — Assessment & Plan Note (Signed)
Clinically doing well. Dysphagia due to peptic stricture, status post dilation. She is doing better. She will like to try generic PPI due to the expense. Will switch her to pantoprazole. Currently she's taking Nexium twice daily so will continue with pantoprazole. She may go back to once daily as long as her heartburn is well controlled. She is also advised to call as with recurrent dysphagia.

## 2011-09-26 NOTE — Patient Instructions (Addendum)
Stop Nexium, begin pantoprazole. You may use one to 2 times daily, 30 minutes before breakfast or evening meal. Try budesonide for your diarrhea. If your insurance company does not cover please let us know and we will try a different medication. He would take 3 capsules daily for 6 weeks, then 2 capsules daily for 2 weeks, one capsule daily for 2 weeks and then stop. Office visit with Dr. Oneida Alar in 6 weeks while on budesonide. Recommend avoiding any type of anti-inflammatory drugs or arthritis as they may make the colitis worse. These would include things such as Naprosyn, Aleve, Advil, ibuprofen.

## 2011-09-26 NOTE — Assessment & Plan Note (Addendum)
Chronic diarrhea likely due to lymphocytic colitis. Patient states Pepto-Bismol regimen did not help. Trial of Entocort. We'll have her come back and see Dr. Oneida Alar in 8 weeks. She may continue to use Imodium as needed, no more than 4 daily. Would advised against NSAIDs as they may cause flare of her microscopic colitis.

## 2011-10-10 NOTE — Progress Notes (Signed)
REVIEWED. AGREE.  PT NEEDS E:30 VISIT.

## 2011-10-11 ENCOUNTER — Encounter: Payer: Self-pay | Admitting: Gastroenterology

## 2011-10-11 NOTE — Progress Notes (Signed)
Pt is aware of OV on 11/24/11 @ 11am with SF in E30 spot

## 2011-11-24 ENCOUNTER — Ambulatory Visit (INDEPENDENT_AMBULATORY_CARE_PROVIDER_SITE_OTHER): Payer: Medicare Other | Admitting: Gastroenterology

## 2011-11-24 ENCOUNTER — Ambulatory Visit: Payer: Medicare Other | Admitting: Gastroenterology

## 2011-11-24 ENCOUNTER — Encounter: Payer: Self-pay | Admitting: Gastroenterology

## 2011-11-24 VITALS — BP 137/74 | HR 83 | Temp 98.1°F | Ht 64.0 in | Wt 180.8 lb

## 2011-11-24 DIAGNOSIS — R197 Diarrhea, unspecified: Secondary | ICD-10-CM

## 2011-11-24 NOTE — Progress Notes (Signed)
Reminder in epic to follow up in 2 months with SF/E30

## 2011-11-24 NOTE — Patient Instructions (Signed)
USE ANTI-DIARRHEAL MEDICINE TWICE DAILY. FLEX SIG TOMORROW. SUBMIT STOOL SAMPLE NEXT WEEK. WILL DRAW BLOOD TOMORROW IN ENDOSCOPY. FOLLOW UP IN 2 MOS.

## 2011-11-24 NOTE — Progress Notes (Signed)
  Subjective:    Patient ID: Nancy Medina, female    DOB: Jun 23, 1925, 78 y.o.   MRN: [id number]  PCP: [facility]  HPI Still havign diarrhea and rectal urgency. HAVING ACCIDENTS: 1 OR 2 TIMES A WEEK which is better but still a problem. Took Pepto and it didn't help, ? Worse. Took Lialda & it helped but didn't resolve her Sx. Both watery and loose stool rare nl stool. RARE abd pain with diarrhea. USU. TAKES ANTI-DIARRHEAL OTC-IT HELPS. HAS TO PAY MORE FOR MEDS.  Past Medical History  Diagnosis Date  . Renal disease     stage III  . Unspecified fall   . Allergic rhinitis   . Achilles tendinitis   . DJD (degenerative joint disease)   . Leg pain   . DM (diabetes mellitus)     type II  . Knee pain, left   . Benign paroxysmal positional vertigo   . Obesity   . Cataract   . Unspecified glaucoma   . Hypertonicity of bladder   . Cerebrovascular accident   . Osteoporosis, unspecified   . HTN (hypertension)   . Hyperlipidemia   . GERD (gastroesophageal reflux disease)   . Depression   . HX: breast cancer   . Anxiety   . Chronic kidney disease (CKD), stage III (moderate)   . Anemia of chronic disease     at least back to 2010  . Lymphocytic colitis 2008    treated with Lialda short-term    Past Surgical History  Procedure Date  . Bladder tack 2000  . Cholecystectomy 1960  . S/p hysterectomy 2000    benign reasons  . Breast lumpectomy 2004    right  . Cataracts   . Colonoscopy 07/2007    lymphocytic colitis  . Esophagogastroduodenoscopy 06/24/2011    Procedure: ESOPHAGOGASTRODUODENOSCOPY (EGD);  Surgeon: Dorothyann Peng, MD;  Location: [facility];  Service: Endoscopy;  Laterality: N/A;.  Peptic stricture, mild erosive reflux esophagitis, hiatal  . Savory dilation 06/24/2011    Procedure: SAVORY DILATION;  Surgeon: Dorothyann Peng, MD;  Location: [facility];  Service: Endoscopy;  Laterality: N/A;    Allergies  Allergen Reactions  . Metformin     REACTION: Felt poor    No  current facility-administered medications for this visit.   No current outpatient prescriptions on file.   Facility-Administered Medications Ordered in Other Visits  Medication Dose Route Frequency Provider Last Rate Last Dose  . 0.45 % sodium chloride infusion   Intravenous Continuous Dorothyann Peng, MD 50 mL/hr at 11/25/11 1200        Review of Systems     Objective:   Physical Exam  Vitals reviewed. Constitutional: She is oriented to person, place, and time. She appears well-developed and well-nourished. She appears distressed.  Eyes: Pupils are equal, round, and reactive to light. No scleral icterus.  Neck: Normal range of motion. Neck supple.  Cardiovascular: Normal rate and normal heart sounds.   Pulmonary/Chest: Effort normal and breath sounds normal. No respiratory distress. She has no wheezes.  Abdominal: Soft. She exhibits no distension. There is no tenderness.  Musculoskeletal: She exhibits no edema.  Lymphadenopathy:    She has cervical adenopathy.  Neurological: She is alert and oriented to person, place, and time.       NO FOCAL DEFICITS   Psychiatric: She has a normal mood and affect.          Assessment & Plan:

## 2011-11-25 ENCOUNTER — Other Ambulatory Visit: Payer: Self-pay | Admitting: Gastroenterology

## 2011-11-25 ENCOUNTER — Encounter (HOSPITAL_COMMUNITY): Payer: Self-pay | Admitting: *Deleted

## 2011-11-25 ENCOUNTER — Ambulatory Visit (HOSPITAL_COMMUNITY)
Admission: RE | Admit: 2011-11-25 | Discharge: 2011-11-25 | Disposition: A | Discharge status: Home / Self Care | Payer: Medicare Other | Source: Ambulatory Visit | Attending: Gastroenterology | Admitting: Gastroenterology

## 2011-11-25 ENCOUNTER — Encounter (HOSPITAL_COMMUNITY)
Admission: RE | Disposition: A | Discharge status: Home / Self Care | Payer: Self-pay | Source: Ambulatory Visit | Attending: Gastroenterology

## 2011-11-25 DIAGNOSIS — I10 Essential (primary) hypertension: Secondary | ICD-10-CM | POA: Insufficient documentation

## 2011-11-25 DIAGNOSIS — K648 Other hemorrhoids: Secondary | ICD-10-CM

## 2011-11-25 DIAGNOSIS — R197 Diarrhea, unspecified: Secondary | ICD-10-CM | POA: Insufficient documentation

## 2011-11-25 DIAGNOSIS — K5730 Diverticulosis of large intestine without perforation or abscess without bleeding: Secondary | ICD-10-CM

## 2011-11-25 DIAGNOSIS — E785 Hyperlipidemia, unspecified: Secondary | ICD-10-CM | POA: Insufficient documentation

## 2011-11-25 DIAGNOSIS — Z79899 Other long term (current) drug therapy: Secondary | ICD-10-CM | POA: Insufficient documentation

## 2011-11-25 DIAGNOSIS — E119 Type 2 diabetes mellitus without complications: Secondary | ICD-10-CM | POA: Insufficient documentation

## 2011-11-25 HISTORY — PX: FLEXIBLE SIGMOIDOSCOPY: SHX5431

## 2011-11-25 SURGERY — SIGMOIDOSCOPY, FLEXIBLE
Anesthesia: Moderate Sedation

## 2011-11-25 MED ORDER — MEPERIDINE HCL 100 MG/ML IJ SOLN
INTRAMUSCULAR | Status: AC
  Filled 2011-11-25: qty 1

## 2011-11-25 MED ORDER — SODIUM CHLORIDE 0.45 % IV SOLN
INTRAVENOUS | Status: DC
  Administered 2011-11-25: 12:00:00 via INTRAVENOUS

## 2011-11-25 MED ORDER — MIDAZOLAM HCL 5 MG/5ML IJ SOLN
INTRAMUSCULAR | Status: AC
  Filled 2011-11-25: qty 5

## 2011-11-25 MED ORDER — MEPERIDINE HCL 100 MG/ML IJ SOLN
INTRAMUSCULAR | Status: DC | PRN
  Administered 2011-11-25 (×2): 25 mg via INTRAVENOUS

## 2011-11-25 MED ORDER — MIDAZOLAM HCL 5 MG/5ML IJ SOLN
INTRAMUSCULAR | Status: DC | PRN
  Administered 2011-11-25: 2 mg via INTRAVENOUS
  Administered 2011-11-25 (×2): 1 mg via INTRAVENOUS

## 2011-11-25 NOTE — H&P (View-Only) (Signed)
  Subjective:    Patient ID: Nancy Medina, female    DOB: 01/19/1925, 78 y.o.   MRN: [id number]  PCP: [facility]  HPI Still havign diarrhea and rectal urgency. HAVING ACCIDENTS: 1 OR 2 TIMES A WEEK which is better but still a problem. Took Pepto and it didn't help, ? Worse. Took Lialda & it helped but didn't resolve her Sx. Both watery and loose stool rare nl stool. RARE abd pain with diarrhea. USU. TAKES ANTI-DIARRHEAL OTC-IT HELPS. HAS TO PAY MORE FOR MEDS.  Past Medical History  Diagnosis Date  . Renal disease     stage III  . Unspecified fall   . Allergic rhinitis   . Achilles tendinitis   . DJD (degenerative joint disease)   . Leg pain   . DM (diabetes mellitus)     type II  . Knee pain, left   . Benign paroxysmal positional vertigo   . Obesity   . Cataract   . Unspecified glaucoma   . Hypertonicity of bladder   . Cerebrovascular accident   . Osteoporosis, unspecified   . HTN (hypertension)   . Hyperlipidemia   . GERD (gastroesophageal reflux disease)   . Depression   . HX: breast cancer   . Anxiety   . Chronic kidney disease (CKD), stage III (moderate)   . Anemia of chronic disease     at least back to 2010  . Lymphocytic colitis 2008    treated with Lialda short-term    Past Surgical History  Procedure Date  . Bladder tack 2000  . Cholecystectomy 1960  . S/p hysterectomy 2000    benign reasons  . Breast lumpectomy 2004    right  . Cataracts   . Colonoscopy 07/2007    lymphocytic colitis  . Esophagogastroduodenoscopy 06/24/2011    Procedure: ESOPHAGOGASTRODUODENOSCOPY (EGD);  Surgeon: Dorothyann Peng, MD;  Location: [facility];  Service: Endoscopy;  Laterality: N/A;.  Peptic stricture, mild erosive reflux esophagitis, hiatal  . Savory dilation 06/24/2011    Procedure: SAVORY DILATION;  Surgeon: Dorothyann Peng, MD;  Location: [facility];  Service: Endoscopy;  Laterality: N/A;    Allergies  Allergen Reactions  . Metformin     REACTION: Felt poor    No  current facility-administered medications for this visit.   No current outpatient prescriptions on file.   Facility-Administered Medications Ordered in Other Visits  Medication Dose Route Frequency Provider Last Rate Last Dose  . 0.45 % sodium chloride infusion   Intravenous Continuous Dorothyann Peng, MD 50 mL/hr at 11/25/11 1200        Review of Systems     Objective:   Physical Exam  Vitals reviewed. Constitutional: She is oriented to person, place, and time. She appears well-developed and well-nourished. She appears distressed.  Eyes: Pupils are equal, round, and reactive to light. No scleral icterus.  Neck: Normal range of motion. Neck supple.  Cardiovascular: Normal rate and normal heart sounds.   Pulmonary/Chest: Effort normal and breath sounds normal. No respiratory distress. She has no wheezes.  Abdominal: Soft. She exhibits no distension. There is no tenderness.  Musculoskeletal: She exhibits no edema.  Lymphadenopathy:    She has cervical adenopathy.  Neurological: She is alert and oriented to person, place, and time.       NO FOCAL DEFICITS   Psychiatric: She has a normal mood and affect.          Assessment & Plan:

## 2011-11-25 NOTE — Interval H&P Note (Signed)
History and Physical Interval Note:  11/25/2011 12:12 PM  Nancy Medina  has presented today for surgery, with the diagnosis of Diarrhea  The various methods of treatment have been discussed with the patient and family. After consideration of risks, benefits and other options for treatment, the patient has consented to  Procedure(s): FLEXIBLE SIGMOIDOSCOPY as a surgical intervention .  The patients' history has been reviewed, patient examined, no change in status, stable for surgery.  I have reviewed the patients' chart and labs.  Questions were answered to the patient's satisfaction.     [employer]

## 2011-11-25 NOTE — H&P (Signed)
BP  Pulse  Temp(Src)  Ht  Wt  BMI    137/74  83  98.1 F (36.7 C) (Temporal)  5\' 4"  (1.626 m)  180 lb 12.8 oz (82.01 kg)  31.03 kg/m2             Progress Notes     Barney Drain, MD 11/25/2011 12:10 PM Signed    Subjective:    Patient ID: Nancy Medina, female DOB: 1925/06/27, 78 y.o. MRN: [id number]  PCP: [facility]  HPI  Still havign diarrhea and rectal urgency. HAVING ACCIDENTS: 1 OR 2 TIMES A WEEK which is better but still a problem. Took Pepto and it didn't help, ? Worse. Took Lialda & it helped but didn't resolve her Sx. Both watery and loose stool rare nl stool. RARE abd pain with diarrhea. USU. TAKES ANTI-DIARRHEAL OTC-IT HELPS. HAS TO PAY MORE FOR MEDS.     Past Medical History     Diagnosis  Date     .  Renal disease        stage III     .  Unspecified fall      .  Allergic rhinitis      .  Achilles tendinitis      .  DJD (degenerative joint disease)      .  Leg pain      .  DM (diabetes mellitus)        type II     .  Knee pain, left      .  Benign paroxysmal positional vertigo      .  Obesity      .  Cataract      .  Unspecified glaucoma      .  Hypertonicity of bladder      .  Cerebrovascular accident      .  Osteoporosis, unspecified      .  HTN (hypertension)      .  Hyperlipidemia      .  GERD (gastroesophageal reflux disease)      .  Depression      .  HX: breast cancer      .  Anxiety      .  Chronic kidney disease (CKD), stage III (moderate)      .  Anemia of chronic disease        at least back to 2010     .  Lymphocytic colitis  2008       treated with Lialda short-term         Past Surgical History     Procedure  Date     .  Bladder tack  2000     .  Cholecystectomy  1960     .  S/p hysterectomy  2000       benign reasons     .  Breast lumpectomy  2004       right     .  Cataracts      .  Colonoscopy  07/2007       lymphocytic colitis     .  Esophagogastroduodenoscopy  06/24/2011       Procedure: ESOPHAGOGASTRODUODENOSCOPY (EGD);  Surgeon: Dorothyann Peng, MD; Location: [facility]; Service: Endoscopy; Laterality: N/A;. Peptic stricture, mild erosive reflux esophagitis, hiatal     .  Savory dilation  06/24/2011       Procedure: SAVORY DILATION; Surgeon: Dorothyann Peng, MD; Location: [facility]; Service: Endoscopy; Laterality: N/A;         Allergies     Allergen  Reactions     .  Metformin        REACTION: Felt poor          No current facility-administered medications for this visit.      No current outpatient prescriptions on file.      Facility-Administered Medications Ordered in Other Visits      Medication  Dose  Route  Frequency  Provider  Last Rate  Last Dose      .  0.45 % sodium chloride infusion   Intravenous  Continuous  Dorothyann Peng, MD  50 mL/hr at 11/25/11 1200        Review of Systems        Objective:       Physical Exam  Vitals reviewed.  Constitutional: She is oriented to person, place, and time. She appears well-developed and well-nourished. She appears distressed.  Eyes: Pupils are equal, round, and reactive to light. No scleral icterus.  Neck: Normal range of motion. Neck supple.  Cardiovascular: Normal rate and normal heart sounds.  Pulmonary/Chest: Effort normal and breath sounds normal. No respiratory distress. She has no wheezes.  Abdominal: Soft. She exhibits no distension. There is no tenderness.  Musculoskeletal: She exhibits no edema.  Lymphadenopathy:  She has cervical adenopathy.  Neurological: She is alert and oriented to person, place, and time.  NO FOCAL DEFICITS  Psychiatric: She has a normal mood and affect.         Assessment & Plan:        Nancy Medina 11/24/2011 11:59 AM Signed  Reminder in epic to follow up in 2 months with SF/E30       Diarrhea - Barney Drain, MD 11/25/2011 12:07 PM Signed  PERSISTENT AND NOT IMPROVED AFTER ENTOCORT AND PEPTO BISMOL. Treatment failure v. funtional diarrhea, doubt thyroid disease, celaic sprue, or Giardiasis.  USE  ANTI-DIARRHEAL MEDICINE TWICE DAILY.  FLEX SIG TOMORROW.  SUBMIT STOOL for giardia ag NEXT WEEK.  WILL DRAW BLOOD : ttg IgA & tshTOMORROW IN ENDOSCOPY.  FOLLOW UP IN 2 MOS.

## 2011-11-25 NOTE — Op Note (Signed)
[facility] [address] [place], [place]  [address]  COLONOSCOPY PROCEDURE REPORT  PATIENT:  Nancy Medina, Nancy Medina  MR#:  [id number] BIRTHDATE:  [birth year], 86 yrs. old  GENDER:  female  ENDOSCOPIST:  Barney Drain, MD REF. BY:  Lemmie Evens, M.D. ASSISTANT:  PROCEDURE DATE:  11/25/2011 PROCEDURE:  FLEXIBLE SIGMOIDOSCOPY WITH BIOPSY  INDICATIONS:  DIARRHEA Hx: LYMPHOCYTIC COLITIS  MEDICATIONS:   Demerol 50 mg IV, Versed 4 mg IV  DESCRIPTION OF PROCEDURE:    Physical exam was performed. Informed consent was obtained from the patient after explaining the benefits, risks, and alternatives to procedure.  The patient was connected to monitor and placed in left lateral position. Continuous oxygen was provided by nasal cannula and IV medicine administered through an indwelling cannula.  After administration of sedation and rectal exam, the patient's rectum was intubated and the [id number]) and [id number]) colonoscope was advanced under direct visualization to the Dickey. The scope was removed slowly by carefully examining the color, texture, anatomy, and integrity mucosa on the way out.  The patient was recovered in endoscopy and discharged home in satisfactory condition. <<PROCEDUREIMAGES>>  FINDINGS:  Scattered diverticula were found in the sigmoid to descending colon segments.  SMALL Internal Hemorrhoids were found. FORMED STOOL IN COLON. BIOPSIES OBTAINED VIA COLD FORCEPS.  COMPLICATIONS:    None  ENDOSCOPIC IMPRESSION: 1. FORMED STOOL IN COLON 2. MODERATE DIVERTICULOSIS 3. INTERNAL HEMORRHOIDS  RECOMMENDATIONS: AWAIT BIOPSIES, LABS, & STOOL STUDY. IMODIUM PRN COMPLETE ENTOCORT OPV IN 2 MOS  REPEAT EXAM:  No  ______________________________ Barney Drain, MD  CC:  Lemmie Evens, M.D.  n. eSIGNED:   Berlin Viereck at 11/25/2011 12:56 PM  Jennette Kettle, [id number]

## 2011-11-25 NOTE — Assessment & Plan Note (Signed)
PERSISTENT AND NOT IMPROVED AFTER ENTOCORT AND PEPTO BISMOL. Treatment failure v. funtional diarrhea, doubt thyroid disease, celaic sprue, or Giardiasis.  USE ANTI-DIARRHEAL MEDICINE TWICE DAILY. FLEX SIG TOMORROW. SUBMIT STOOL for giardia ag NEXT WEEK. WILL DRAW BLOOD : ttg IgA & tshTOMORROW IN ENDOSCOPY. FOLLOW UP IN 2 MOS.

## 2011-11-28 NOTE — Progress Notes (Signed)
Cc to PCP 

## 2011-11-29 ENCOUNTER — Telehealth: Payer: Self-pay | Admitting: Gastroenterology

## 2011-11-29 NOTE — Telephone Encounter (Signed)
Her colon Bx are normal. She needs to COMPLETE THE ENTOCORT. It is working. USE IMODIUM SPARINGLY. OPV MAR 2013.

## 2011-11-29 NOTE — Telephone Encounter (Signed)
Called, got fax machine.

## 2011-11-30 NOTE — Telephone Encounter (Signed)
Cc to PCP 

## 2011-11-30 NOTE — Progress Notes (Signed)
NL COLON BX FORMED STOOL IN COLON. NO NEED FOR TTG OR GIARDIA AG AT THIS TIME.

## 2011-11-30 NOTE — Telephone Encounter (Signed)
Reminder in epic to follow up with SF/E30 in 3 months

## 2011-12-01 ENCOUNTER — Encounter (HOSPITAL_COMMUNITY): Payer: Self-pay | Admitting: Gastroenterology

## 2011-12-01 NOTE — Telephone Encounter (Signed)
Called and informed pt.  

## 2011-12-19 ENCOUNTER — Other Ambulatory Visit: Payer: Self-pay | Admitting: Gastroenterology

## 2011-12-20 LAB — GIARDIA/CRYPTOSPORIDIUM (EIA)
Cryptosporidium Screen (EIA): NEGATIVE
Giardia Screen (EIA): NEGATIVE

## 2011-12-21 NOTE — Progress Notes (Signed)
Quick Note:  Pt informed ______ 

## 2012-02-06 ENCOUNTER — Encounter: Payer: Self-pay | Admitting: Gastroenterology

## 2012-03-07 ENCOUNTER — Encounter: Payer: Self-pay | Admitting: Gastroenterology

## 2012-03-08 ENCOUNTER — Encounter: Payer: Self-pay | Admitting: Gastroenterology

## 2012-03-08 ENCOUNTER — Ambulatory Visit (INDEPENDENT_AMBULATORY_CARE_PROVIDER_SITE_OTHER): Payer: Medicare Other | Admitting: Gastroenterology

## 2012-03-08 VITALS — BP 125/78 | HR 75 | Temp 97.7°F | Ht 62.0 in | Wt 178.2 lb

## 2012-03-08 DIAGNOSIS — R1314 Dysphagia, pharyngoesophageal phase: Secondary | ICD-10-CM

## 2012-03-08 DIAGNOSIS — K219 Gastro-esophageal reflux disease without esophagitis: Secondary | ICD-10-CM

## 2012-03-08 DIAGNOSIS — K5289 Other specified noninfective gastroenteritis and colitis: Secondary | ICD-10-CM

## 2012-03-08 DIAGNOSIS — R197 Diarrhea, unspecified: Secondary | ICD-10-CM

## 2012-03-08 DIAGNOSIS — K52832 Lymphocytic colitis: Secondary | ICD-10-CM

## 2012-03-08 DIAGNOSIS — R1310 Dysphagia, unspecified: Secondary | ICD-10-CM

## 2012-03-08 MED ORDER — ESOMEPRAZOLE MAGNESIUM 40 MG PO CPDR: DELAYED_RELEASE_CAPSULE | ORAL | Status: DC

## 2012-03-08 NOTE — Patient Instructions (Signed)
Use imodium as needed.  Continue Nexium.  FOLLOW UP IN 4 MOS.

## 2012-03-08 NOTE — Assessment & Plan Note (Signed)
CONTROLLED.   Continue Nexium. FOLLOW UP IN 4 MOS.

## 2012-03-08 NOTE — Assessment & Plan Note (Addendum)
Use imodium as needed. FOLLOW UP IN 4 MOS.

## 2012-03-08 NOTE — Progress Notes (Signed)
Subjective:    Patient ID: Nancy Medina, female    DOB: 31-Aug-1925, 78 y.o.   MRN: [id number]  PCP: [facility]  HPI Used imodium 3 or 4 about 4-5 days. USES IMODIUM ONCE ECEVRY WEEKOR TWO. APPETITE: GOOD. BMS: DAILY(SORT OF NL NOW), RARE ACCIDENT WITH LOOSE STOOLS.   Past Medical History  Diagnosis Date  . Renal disease     stage III  . Unspecified fall   . Allergic rhinitis   . Achilles tendinitis   . DJD (degenerative joint disease)   . Leg pain   . DM (diabetes mellitus)     type II  . Knee pain, left   . Benign paroxysmal positional vertigo   . Obesity   . Cataract   . Unspecified glaucoma   . Hypertonicity of bladder   . Cerebrovascular accident   . Osteoporosis, unspecified   . HTN (hypertension)   . Hyperlipidemia   . GERD (gastroesophageal reflux disease)   . Depression   . HX: breast cancer   . Anxiety   . Chronic kidney disease (CKD), stage III (moderate)   . Anemia of chronic disease     at least back to 2010  . Lymphocytic colitis 2008    treated with Lialda short-term    Past Surgical History  Procedure Date  . Bladder tack 2000  . Cholecystectomy 1960  . S/p hysterectomy 2000    benign reasons  . Breast lumpectomy 2004    right  . Cataracts   . Colonoscopy 07/2007    lymphocytic colitis  . Esophagogastroduodenoscopy 06/24/2011    Procedure: ESOPHAGOGASTRODUODENOSCOPY (EGD);  Surgeon: Dorothyann Peng, MD;  [facility];  Service: Endoscopy;  Laterality: N/A;.  Peptic stricture, mild erosive reflux esophagitis, hiatal  . Savory dilation 06/24/2011    Procedure: SAVORY DILATION;  Surgeon: Dorothyann Peng, MD;  Location: [facility];  Service: Endoscopy;  Laterality: N/A;  . Flexible sigmoidoscopy 11/25/2011    MODERATE DIVERTICULOSIS/INTERNAL HEMORRHOIDS    Allergies  Allergen Reactions  . Metformin     REACTION: Felt poor    Current Outpatient Prescriptions  Medication Sig Dispense Refill  . amLODipine (NORVASC) 10 MG tablet Take 10  mg by mouth daily.        Marland Kitchen aspirin 81 MG tablet Take 81 mg by mouth daily.        .      . Calcium Carbonate-Vit D-Min (CALTRATE 600+D PLUS) 600-400 MG-UNIT per tablet Take 1 tablet by mouth daily.        . candesartan-hydrochlorothiazide (ATACAND HCT) 32-12.5 MG per tablet Take 1 tablet by mouth daily.        . Cholecalciferol (VITAMIN D-1000 MAX ST) 1000 UNITS tablet Take 1,000 Units by mouth daily.        . citalopram (CELEXA) 20 MG tablet Take 20 mg by mouth daily.        . clonazePAM (KLONOPIN) 1 MG tablet Take 1 mg by mouth 2 (two) times daily as needed.        . Cyanocobalamin (VITAMIN B-12 PO) Take 1,500 mg by mouth.        . esomeprazole (NEXIUM) 40 MG capsule Take 40 mg by mouth daily before breakfast.      . fish oil-omega-3 fatty acids 1000 MG capsule Take 2 g by mouth daily.        . furosemide (LASIX) 20 MG tablet 20 mg daily.       Marland Kitchen glimepiride (AMARYL) 4  MG tablet 4 mg daily.       Marland Kitchen latanoprost (XALATAN) 0.005 % ophthalmic solution       . losartan (COZAAR) 100 MG tablet Take 100 mg by mouth daily.       . meclizine (ANTIVERT) 25 MG tablet Take 25 mg by mouth 3 (three) times daily as needed.        . metoprolol (TOPROL-XL) 100 MG 24 hr tablet Take 100 mg by mouth daily. 1/2 tablet       . Multiple Vitamin (MULTIVITAMIN) capsule Take 1 capsule by mouth daily.        .      .            Review of Systems     Objective:   Physical Exam  Vitals reviewed. Constitutional: She is oriented to person, place, and time. She appears well-developed. No distress.  HENT:  Head: Normocephalic and atraumatic.  Mouth/Throat: No oropharyngeal exudate.  Cardiovascular: Normal rate, regular rhythm and normal heart sounds.   Pulmonary/Chest: Effort normal and breath sounds normal. No respiratory distress.  Abdominal: Soft. Bowel sounds are normal. She exhibits no distension. There is no tenderness.  Neurological: She is alert and oriented to person, place, and time.       NO  NEW  FOCAL DEFICITS           Assessment & Plan:

## 2012-03-08 NOTE — Assessment & Plan Note (Signed)
RESOLVED. 

## 2012-03-08 NOTE — Assessment & Plan Note (Signed)
RESOLVED WITH ENTOCORT.

## 2012-03-08 NOTE — Progress Notes (Signed)
Reminder in epic to follow up in 4 months °

## 2012-03-08 NOTE — Progress Notes (Signed)
Faxed to PCP

## 2012-03-28 ENCOUNTER — Ambulatory Visit (HOSPITAL_COMMUNITY)
Admission: RE | Admit: 2012-03-28 | Discharge: 2012-03-28 | Disposition: A | Discharge status: Home / Self Care | Payer: Medicare Other | Source: Ambulatory Visit | Attending: Family Medicine | Admitting: Family Medicine

## 2012-03-28 DIAGNOSIS — Z9889 Other specified postprocedural states: Secondary | ICD-10-CM

## 2012-03-28 DIAGNOSIS — Z1231 Encounter for screening mammogram for malignant neoplasm of breast: Secondary | ICD-10-CM | POA: Insufficient documentation

## 2012-04-03 ENCOUNTER — Other Ambulatory Visit: Payer: Self-pay | Admitting: Family Medicine

## 2012-04-03 DIAGNOSIS — R928 Other abnormal and inconclusive findings on diagnostic imaging of breast: Secondary | ICD-10-CM

## 2012-04-18 ENCOUNTER — Ambulatory Visit (HOSPITAL_COMMUNITY)
Admission: RE | Admit: 2012-04-18 | Discharge: 2012-04-18 | Disposition: A | Discharge status: Home / Self Care | Payer: Medicare Other | Source: Ambulatory Visit | Attending: Family Medicine | Admitting: Family Medicine

## 2012-04-18 DIAGNOSIS — R928 Other abnormal and inconclusive findings on diagnostic imaging of breast: Secondary | ICD-10-CM | POA: Insufficient documentation

## 2012-04-18 DIAGNOSIS — Z853 Personal history of malignant neoplasm of breast: Secondary | ICD-10-CM | POA: Insufficient documentation

## 2012-06-21 ENCOUNTER — Encounter: Payer: Self-pay | Admitting: Gastroenterology

## 2012-09-10 ENCOUNTER — Other Ambulatory Visit (HOSPITAL_COMMUNITY): Payer: Self-pay | Admitting: Family Medicine

## 2012-09-10 DIAGNOSIS — Z09 Encounter for follow-up examination after completed treatment for conditions other than malignant neoplasm: Secondary | ICD-10-CM

## 2012-10-24 ENCOUNTER — Ambulatory Visit (HOSPITAL_COMMUNITY)
Admission: RE | Admit: 2012-10-24 | Discharge: 2012-10-24 | Disposition: A | Discharge status: Home / Self Care | Payer: Medicare Other | Source: Ambulatory Visit | Attending: Family Medicine | Admitting: Family Medicine

## 2012-10-24 DIAGNOSIS — R922 Inconclusive mammogram: Secondary | ICD-10-CM | POA: Insufficient documentation

## 2012-10-24 DIAGNOSIS — Z09 Encounter for follow-up examination after completed treatment for conditions other than malignant neoplasm: Secondary | ICD-10-CM | POA: Insufficient documentation

## 2013-01-15 ENCOUNTER — Other Ambulatory Visit (HOSPITAL_COMMUNITY): Payer: Self-pay | Admitting: Nephrology

## 2013-01-31 ENCOUNTER — Ambulatory Visit (HOSPITAL_COMMUNITY)
Admission: RE | Admit: 2013-01-31 | Discharge: 2013-01-31 | Disposition: A | Discharge status: Home / Self Care | Payer: Medicare Other | Source: Ambulatory Visit | Attending: Nephrology | Admitting: Nephrology

## 2013-01-31 DIAGNOSIS — N289 Disorder of kidney and ureter, unspecified: Secondary | ICD-10-CM | POA: Insufficient documentation

## 2013-04-01 ENCOUNTER — Encounter: Payer: Self-pay | Admitting: Gastroenterology

## 2013-05-06 ENCOUNTER — Other Ambulatory Visit (HOSPITAL_COMMUNITY): Payer: Self-pay | Admitting: Family Medicine

## 2013-05-06 DIAGNOSIS — C50912 Malignant neoplasm of unspecified site of left female breast: Secondary | ICD-10-CM

## 2013-05-15 ENCOUNTER — Ambulatory Visit (HOSPITAL_COMMUNITY)
Admission: RE | Admit: 2013-05-15 | Discharge: 2013-05-15 | Disposition: A | Discharge status: Home / Self Care | Payer: Medicare Other | Source: Ambulatory Visit | Attending: Family Medicine | Admitting: Family Medicine

## 2013-05-15 ENCOUNTER — Other Ambulatory Visit (HOSPITAL_COMMUNITY): Payer: Self-pay | Admitting: Family Medicine

## 2013-05-15 DIAGNOSIS — C50912 Malignant neoplasm of unspecified site of left female breast: Secondary | ICD-10-CM

## 2013-05-15 DIAGNOSIS — Z853 Personal history of malignant neoplasm of breast: Secondary | ICD-10-CM | POA: Insufficient documentation

## 2013-10-29 ENCOUNTER — Other Ambulatory Visit (HOSPITAL_COMMUNITY): Payer: Self-pay | Admitting: Family Medicine

## 2013-10-29 DIAGNOSIS — R928 Other abnormal and inconclusive findings on diagnostic imaging of breast: Secondary | ICD-10-CM

## 2013-11-27 ENCOUNTER — Ambulatory Visit (HOSPITAL_COMMUNITY)
Admission: RE | Admit: 2013-11-27 | Discharge: 2013-11-27 | Disposition: A | Discharge status: Home / Self Care | Payer: Medicare Other | Source: Ambulatory Visit | Attending: Family Medicine | Admitting: Family Medicine

## 2013-11-27 DIAGNOSIS — Z853 Personal history of malignant neoplasm of breast: Secondary | ICD-10-CM | POA: Insufficient documentation

## 2013-11-27 DIAGNOSIS — Z09 Encounter for follow-up examination after completed treatment for conditions other than malignant neoplasm: Secondary | ICD-10-CM | POA: Insufficient documentation

## 2013-11-27 DIAGNOSIS — R928 Other abnormal and inconclusive findings on diagnostic imaging of breast: Secondary | ICD-10-CM | POA: Insufficient documentation

## 2014-02-04 ENCOUNTER — Other Ambulatory Visit (HOSPITAL_COMMUNITY): Payer: Self-pay | Admitting: Family Medicine

## 2014-02-04 DIAGNOSIS — M810 Age-related osteoporosis without current pathological fracture: Secondary | ICD-10-CM

## 2014-02-12 ENCOUNTER — Ambulatory Visit (HOSPITAL_COMMUNITY)
Admission: RE | Admit: 2014-02-12 | Discharge: 2014-02-12 | Disposition: A | Discharge status: Home / Self Care | Payer: Medicare Other | Source: Ambulatory Visit | Attending: Family Medicine | Admitting: Family Medicine

## 2014-02-12 DIAGNOSIS — Z853 Personal history of malignant neoplasm of breast: Secondary | ICD-10-CM | POA: Insufficient documentation

## 2014-02-12 DIAGNOSIS — Z780 Asymptomatic menopausal state: Secondary | ICD-10-CM | POA: Insufficient documentation

## 2014-02-12 DIAGNOSIS — M810 Age-related osteoporosis without current pathological fracture: Secondary | ICD-10-CM

## 2014-02-12 DIAGNOSIS — M818 Other osteoporosis without current pathological fracture: Secondary | ICD-10-CM | POA: Insufficient documentation

## 2014-03-26 ENCOUNTER — Other Ambulatory Visit (HOSPITAL_COMMUNITY): Payer: Self-pay | Admitting: Family Medicine

## 2014-03-26 ENCOUNTER — Ambulatory Visit (HOSPITAL_COMMUNITY)
Admission: RE | Admit: 2014-03-26 | Discharge: 2014-03-26 | Disposition: A | Discharge status: Home / Self Care | Payer: Medicare Other | Source: Ambulatory Visit | Attending: Family Medicine | Admitting: Family Medicine

## 2014-03-26 DIAGNOSIS — I700 Atherosclerosis of aorta: Secondary | ICD-10-CM | POA: Insufficient documentation

## 2014-03-26 DIAGNOSIS — R29898 Other symptoms and signs involving the musculoskeletal system: Secondary | ICD-10-CM

## 2014-03-26 DIAGNOSIS — M51379 Other intervertebral disc degeneration, lumbosacral region without mention of lumbar back pain or lower extremity pain: Secondary | ICD-10-CM | POA: Insufficient documentation

## 2014-03-26 DIAGNOSIS — M5137 Other intervertebral disc degeneration, lumbosacral region: Secondary | ICD-10-CM | POA: Insufficient documentation

## 2014-05-19 ENCOUNTER — Other Ambulatory Visit (HOSPITAL_COMMUNITY): Payer: Self-pay | Admitting: Family Medicine

## 2014-05-19 DIAGNOSIS — Z1231 Encounter for screening mammogram for malignant neoplasm of breast: Secondary | ICD-10-CM

## 2014-05-22 ENCOUNTER — Ambulatory Visit (HOSPITAL_COMMUNITY): Payer: Medicare Other

## 2014-06-04 ENCOUNTER — Ambulatory Visit (HOSPITAL_COMMUNITY): Payer: Medicare Other

## 2014-06-11 ENCOUNTER — Ambulatory Visit (HOSPITAL_COMMUNITY)
Admission: RE | Admit: 2014-06-11 | Discharge: 2014-06-11 | Disposition: A | Discharge status: Home / Self Care | Payer: Medicare Other | Source: Ambulatory Visit | Attending: Family Medicine | Admitting: Family Medicine

## 2014-06-11 DIAGNOSIS — Z1231 Encounter for screening mammogram for malignant neoplasm of breast: Secondary | ICD-10-CM | POA: Insufficient documentation

## 2015-05-26 ENCOUNTER — Other Ambulatory Visit (HOSPITAL_COMMUNITY): Payer: Self-pay | Admitting: Family Medicine

## 2015-05-26 DIAGNOSIS — Z1231 Encounter for screening mammogram for malignant neoplasm of breast: Secondary | ICD-10-CM

## 2015-06-15 ENCOUNTER — Ambulatory Visit (HOSPITAL_COMMUNITY)
Admission: RE | Admit: 2015-06-15 | Discharge: 2015-06-15 | Disposition: A | Discharge status: Home / Self Care | Payer: PPO | Source: Ambulatory Visit | Attending: Family Medicine | Admitting: Family Medicine

## 2015-06-15 DIAGNOSIS — Z1231 Encounter for screening mammogram for malignant neoplasm of breast: Secondary | ICD-10-CM | POA: Diagnosis present

## 2015-09-04 ENCOUNTER — Ambulatory Visit (INDEPENDENT_AMBULATORY_CARE_PROVIDER_SITE_OTHER): Payer: PPO | Admitting: Urology

## 2015-09-04 DIAGNOSIS — N362 Urethral caruncle: Secondary | ICD-10-CM

## 2015-09-04 DIAGNOSIS — R310 Gross hematuria: Secondary | ICD-10-CM | POA: Diagnosis not present

## 2015-11-04 ENCOUNTER — Ambulatory Visit (INDEPENDENT_AMBULATORY_CARE_PROVIDER_SITE_OTHER): Payer: PPO | Admitting: Urology

## 2015-11-04 DIAGNOSIS — N362 Urethral caruncle: Secondary | ICD-10-CM | POA: Diagnosis not present

## 2015-11-04 DIAGNOSIS — R310 Gross hematuria: Secondary | ICD-10-CM

## 2016-01-13 DIAGNOSIS — N183 Chronic kidney disease, stage 3 (moderate): Secondary | ICD-10-CM | POA: Diagnosis not present

## 2016-01-13 DIAGNOSIS — E6609 Other obesity due to excess calories: Secondary | ICD-10-CM | POA: Diagnosis not present

## 2016-01-13 DIAGNOSIS — E119 Type 2 diabetes mellitus without complications: Secondary | ICD-10-CM | POA: Diagnosis not present

## 2016-01-13 DIAGNOSIS — I10 Essential (primary) hypertension: Secondary | ICD-10-CM | POA: Diagnosis not present

## 2016-02-03 ENCOUNTER — Ambulatory Visit: Payer: PPO | Admitting: Urology

## 2016-03-08 DIAGNOSIS — H401114 Primary open-angle glaucoma, right eye, indeterminate stage: Secondary | ICD-10-CM | POA: Diagnosis not present

## 2016-03-08 DIAGNOSIS — H401124 Primary open-angle glaucoma, left eye, indeterminate stage: Secondary | ICD-10-CM | POA: Diagnosis not present

## 2016-05-10 DIAGNOSIS — E119 Type 2 diabetes mellitus without complications: Secondary | ICD-10-CM | POA: Diagnosis not present

## 2016-05-10 DIAGNOSIS — I10 Essential (primary) hypertension: Secondary | ICD-10-CM | POA: Diagnosis not present

## 2016-05-10 DIAGNOSIS — N183 Chronic kidney disease, stage 3 (moderate): Secondary | ICD-10-CM | POA: Diagnosis not present

## 2016-05-10 DIAGNOSIS — E7800 Pure hypercholesterolemia, unspecified: Secondary | ICD-10-CM | POA: Diagnosis not present

## 2016-05-12 DIAGNOSIS — E7800 Pure hypercholesterolemia, unspecified: Secondary | ICD-10-CM | POA: Diagnosis not present

## 2016-05-12 DIAGNOSIS — I10 Essential (primary) hypertension: Secondary | ICD-10-CM | POA: Diagnosis not present

## 2016-05-12 DIAGNOSIS — E119 Type 2 diabetes mellitus without complications: Secondary | ICD-10-CM | POA: Diagnosis not present

## 2016-06-24 ENCOUNTER — Other Ambulatory Visit (HOSPITAL_COMMUNITY): Payer: Self-pay | Admitting: Family Medicine

## 2016-06-24 DIAGNOSIS — Z1231 Encounter for screening mammogram for malignant neoplasm of breast: Secondary | ICD-10-CM

## 2016-06-30 ENCOUNTER — Ambulatory Visit (HOSPITAL_COMMUNITY)
Admission: RE | Admit: 2016-06-30 | Discharge: 2016-06-30 | Disposition: A | Discharge status: Home / Self Care | Payer: PPO | Source: Ambulatory Visit | Attending: Family Medicine | Admitting: Family Medicine

## 2016-06-30 DIAGNOSIS — Z1231 Encounter for screening mammogram for malignant neoplasm of breast: Secondary | ICD-10-CM | POA: Diagnosis not present

## 2016-07-09 LAB — BASIC METABOLIC PANEL: Glucose: 125 mg/dL

## 2016-08-23 DIAGNOSIS — H401124 Primary open-angle glaucoma, left eye, indeterminate stage: Secondary | ICD-10-CM | POA: Diagnosis not present

## 2016-08-23 DIAGNOSIS — H5213 Myopia, bilateral: Secondary | ICD-10-CM | POA: Diagnosis not present

## 2016-08-23 DIAGNOSIS — H401114 Primary open-angle glaucoma, right eye, indeterminate stage: Secondary | ICD-10-CM | POA: Diagnosis not present

## 2016-08-23 DIAGNOSIS — E113292 Type 2 diabetes mellitus with mild nonproliferative diabetic retinopathy without macular edema, left eye: Secondary | ICD-10-CM | POA: Diagnosis not present

## 2016-09-09 DIAGNOSIS — Z23 Encounter for immunization: Secondary | ICD-10-CM | POA: Diagnosis not present

## 2016-09-09 DIAGNOSIS — E119 Type 2 diabetes mellitus without complications: Secondary | ICD-10-CM | POA: Diagnosis not present

## 2016-09-09 DIAGNOSIS — E7800 Pure hypercholesterolemia, unspecified: Secondary | ICD-10-CM | POA: Diagnosis not present

## 2016-09-09 DIAGNOSIS — E6609 Other obesity due to excess calories: Secondary | ICD-10-CM | POA: Diagnosis not present

## 2016-09-09 DIAGNOSIS — I10 Essential (primary) hypertension: Secondary | ICD-10-CM | POA: Diagnosis not present

## 2016-09-13 DIAGNOSIS — E559 Vitamin D deficiency, unspecified: Secondary | ICD-10-CM | POA: Diagnosis not present

## 2016-09-13 DIAGNOSIS — E7800 Pure hypercholesterolemia, unspecified: Secondary | ICD-10-CM | POA: Diagnosis not present

## 2016-09-13 DIAGNOSIS — I10 Essential (primary) hypertension: Secondary | ICD-10-CM | POA: Diagnosis not present

## 2016-09-13 DIAGNOSIS — E119 Type 2 diabetes mellitus without complications: Secondary | ICD-10-CM | POA: Diagnosis not present

## 2016-10-12 DIAGNOSIS — I10 Essential (primary) hypertension: Secondary | ICD-10-CM | POA: Diagnosis not present

## 2016-10-12 DIAGNOSIS — N183 Chronic kidney disease, stage 3 (moderate): Secondary | ICD-10-CM | POA: Diagnosis not present

## 2016-10-12 DIAGNOSIS — E119 Type 2 diabetes mellitus without complications: Secondary | ICD-10-CM | POA: Diagnosis not present

## 2017-01-09 DIAGNOSIS — N183 Chronic kidney disease, stage 3 (moderate): Secondary | ICD-10-CM | POA: Diagnosis not present

## 2017-01-09 DIAGNOSIS — G4700 Insomnia, unspecified: Secondary | ICD-10-CM | POA: Diagnosis not present

## 2017-01-09 DIAGNOSIS — E6609 Other obesity due to excess calories: Secondary | ICD-10-CM | POA: Diagnosis not present

## 2017-01-09 DIAGNOSIS — I10 Essential (primary) hypertension: Secondary | ICD-10-CM | POA: Diagnosis not present

## 2017-04-03 DIAGNOSIS — E7800 Pure hypercholesterolemia, unspecified: Secondary | ICD-10-CM | POA: Diagnosis not present

## 2017-04-03 DIAGNOSIS — I10 Essential (primary) hypertension: Secondary | ICD-10-CM | POA: Diagnosis not present

## 2017-04-03 DIAGNOSIS — N183 Chronic kidney disease, stage 3 (moderate): Secondary | ICD-10-CM | POA: Diagnosis not present

## 2017-04-03 DIAGNOSIS — E6609 Other obesity due to excess calories: Secondary | ICD-10-CM | POA: Diagnosis not present

## 2017-05-01 DIAGNOSIS — E119 Type 2 diabetes mellitus without complications: Secondary | ICD-10-CM | POA: Diagnosis not present

## 2017-05-01 DIAGNOSIS — I10 Essential (primary) hypertension: Secondary | ICD-10-CM | POA: Diagnosis not present

## 2017-05-01 DIAGNOSIS — E669 Obesity, unspecified: Secondary | ICD-10-CM | POA: Diagnosis not present

## 2017-05-01 DIAGNOSIS — E559 Vitamin D deficiency, unspecified: Secondary | ICD-10-CM | POA: Diagnosis not present

## 2017-05-16 DIAGNOSIS — H04123 Dry eye syndrome of bilateral lacrimal glands: Secondary | ICD-10-CM | POA: Diagnosis not present

## 2017-05-16 DIAGNOSIS — H02054 Trichiasis without entropian left upper eyelid: Secondary | ICD-10-CM | POA: Diagnosis not present

## 2017-05-16 DIAGNOSIS — H01001 Unspecified blepharitis right upper eyelid: Secondary | ICD-10-CM | POA: Diagnosis not present

## 2017-05-16 DIAGNOSIS — H401134 Primary open-angle glaucoma, bilateral, indeterminate stage: Secondary | ICD-10-CM | POA: Diagnosis not present

## 2017-05-17 DIAGNOSIS — E559 Vitamin D deficiency, unspecified: Secondary | ICD-10-CM | POA: Diagnosis not present

## 2017-05-17 DIAGNOSIS — E119 Type 2 diabetes mellitus without complications: Secondary | ICD-10-CM | POA: Diagnosis not present

## 2017-05-17 DIAGNOSIS — I10 Essential (primary) hypertension: Secondary | ICD-10-CM | POA: Diagnosis not present

## 2017-05-22 DIAGNOSIS — I10 Essential (primary) hypertension: Secondary | ICD-10-CM | POA: Diagnosis not present

## 2017-05-22 DIAGNOSIS — E559 Vitamin D deficiency, unspecified: Secondary | ICD-10-CM | POA: Diagnosis not present

## 2017-05-22 DIAGNOSIS — E119 Type 2 diabetes mellitus without complications: Secondary | ICD-10-CM | POA: Diagnosis not present

## 2017-06-26 ENCOUNTER — Other Ambulatory Visit (HOSPITAL_COMMUNITY): Payer: Self-pay | Admitting: Internal Medicine

## 2017-06-26 DIAGNOSIS — Z1231 Encounter for screening mammogram for malignant neoplasm of breast: Secondary | ICD-10-CM

## 2017-07-04 DIAGNOSIS — E119 Type 2 diabetes mellitus without complications: Secondary | ICD-10-CM | POA: Diagnosis not present

## 2017-07-04 DIAGNOSIS — E7800 Pure hypercholesterolemia, unspecified: Secondary | ICD-10-CM | POA: Diagnosis not present

## 2017-07-04 DIAGNOSIS — G4700 Insomnia, unspecified: Secondary | ICD-10-CM | POA: Diagnosis not present

## 2017-07-04 DIAGNOSIS — I10 Essential (primary) hypertension: Secondary | ICD-10-CM | POA: Diagnosis not present

## 2017-07-06 ENCOUNTER — Ambulatory Visit (HOSPITAL_COMMUNITY)
Admission: RE | Admit: 2017-07-06 | Discharge: 2017-07-06 | Disposition: A | Discharge status: Home / Self Care | Payer: PPO | Source: Ambulatory Visit | Attending: Internal Medicine | Admitting: Internal Medicine

## 2017-07-06 ENCOUNTER — Other Ambulatory Visit (HOSPITAL_COMMUNITY): Payer: Self-pay | Admitting: Family Medicine

## 2017-07-06 DIAGNOSIS — I10 Essential (primary) hypertension: Secondary | ICD-10-CM | POA: Diagnosis not present

## 2017-07-06 DIAGNOSIS — E7800 Pure hypercholesterolemia, unspecified: Secondary | ICD-10-CM | POA: Diagnosis not present

## 2017-07-06 DIAGNOSIS — Z1231 Encounter for screening mammogram for malignant neoplasm of breast: Secondary | ICD-10-CM | POA: Diagnosis not present

## 2017-07-06 DIAGNOSIS — E6609 Other obesity due to excess calories: Secondary | ICD-10-CM | POA: Diagnosis not present

## 2017-07-06 DIAGNOSIS — E119 Type 2 diabetes mellitus without complications: Secondary | ICD-10-CM | POA: Diagnosis not present

## 2017-09-25 DIAGNOSIS — H04123 Dry eye syndrome of bilateral lacrimal glands: Secondary | ICD-10-CM | POA: Diagnosis not present

## 2017-09-25 DIAGNOSIS — E119 Type 2 diabetes mellitus without complications: Secondary | ICD-10-CM | POA: Diagnosis not present

## 2017-09-25 DIAGNOSIS — H401134 Primary open-angle glaucoma, bilateral, indeterminate stage: Secondary | ICD-10-CM | POA: Diagnosis not present

## 2017-09-25 DIAGNOSIS — H5213 Myopia, bilateral: Secondary | ICD-10-CM | POA: Diagnosis not present

## 2017-10-06 DIAGNOSIS — E7800 Pure hypercholesterolemia, unspecified: Secondary | ICD-10-CM | POA: Diagnosis not present

## 2017-10-06 DIAGNOSIS — I10 Essential (primary) hypertension: Secondary | ICD-10-CM | POA: Diagnosis not present

## 2017-10-06 DIAGNOSIS — N183 Chronic kidney disease, stage 3 (moderate): Secondary | ICD-10-CM | POA: Diagnosis not present

## 2017-10-06 DIAGNOSIS — E119 Type 2 diabetes mellitus without complications: Secondary | ICD-10-CM | POA: Diagnosis not present

## 2017-10-06 DIAGNOSIS — Z008 Encounter for other general examination: Secondary | ICD-10-CM | POA: Diagnosis not present

## 2017-10-09 DIAGNOSIS — E119 Type 2 diabetes mellitus without complications: Secondary | ICD-10-CM | POA: Diagnosis not present

## 2017-10-09 DIAGNOSIS — I10 Essential (primary) hypertension: Secondary | ICD-10-CM | POA: Diagnosis not present

## 2017-10-09 DIAGNOSIS — E7800 Pure hypercholesterolemia, unspecified: Secondary | ICD-10-CM | POA: Diagnosis not present

## 2017-10-09 DIAGNOSIS — N183 Chronic kidney disease, stage 3 (moderate): Secondary | ICD-10-CM | POA: Diagnosis not present

## 2017-10-21 DIAGNOSIS — H409 Unspecified glaucoma: Secondary | ICD-10-CM | POA: Diagnosis not present

## 2017-10-21 DIAGNOSIS — H5712 Ocular pain, left eye: Secondary | ICD-10-CM | POA: Diagnosis not present

## 2017-11-23 DIAGNOSIS — E6609 Other obesity due to excess calories: Secondary | ICD-10-CM | POA: Diagnosis not present

## 2017-11-23 DIAGNOSIS — R7989 Other specified abnormal findings of blood chemistry: Secondary | ICD-10-CM | POA: Diagnosis not present

## 2017-11-23 DIAGNOSIS — E7800 Pure hypercholesterolemia, unspecified: Secondary | ICD-10-CM | POA: Diagnosis not present

## 2017-11-23 DIAGNOSIS — E119 Type 2 diabetes mellitus without complications: Secondary | ICD-10-CM | POA: Diagnosis not present

## 2017-12-11 DIAGNOSIS — R05 Cough: Secondary | ICD-10-CM | POA: Diagnosis not present

## 2018-01-04 DIAGNOSIS — L600 Ingrowing nail: Secondary | ICD-10-CM | POA: Diagnosis not present

## 2018-01-04 DIAGNOSIS — M79674 Pain in right toe(s): Secondary | ICD-10-CM | POA: Diagnosis not present

## 2018-01-04 DIAGNOSIS — M79675 Pain in left toe(s): Secondary | ICD-10-CM | POA: Diagnosis not present

## 2018-01-09 DIAGNOSIS — N390 Urinary tract infection, site not specified: Secondary | ICD-10-CM | POA: Diagnosis not present

## 2018-01-09 DIAGNOSIS — N183 Chronic kidney disease, stage 3 (moderate): Secondary | ICD-10-CM | POA: Diagnosis not present

## 2018-01-09 DIAGNOSIS — E7800 Pure hypercholesterolemia, unspecified: Secondary | ICD-10-CM | POA: Diagnosis not present

## 2018-01-09 DIAGNOSIS — R350 Frequency of micturition: Secondary | ICD-10-CM | POA: Diagnosis not present

## 2018-01-09 DIAGNOSIS — I10 Essential (primary) hypertension: Secondary | ICD-10-CM | POA: Diagnosis not present

## 2018-01-09 DIAGNOSIS — E119 Type 2 diabetes mellitus without complications: Secondary | ICD-10-CM | POA: Diagnosis not present

## 2018-01-12 DIAGNOSIS — E119 Type 2 diabetes mellitus without complications: Secondary | ICD-10-CM | POA: Diagnosis not present

## 2018-01-12 DIAGNOSIS — I10 Essential (primary) hypertension: Secondary | ICD-10-CM | POA: Diagnosis not present

## 2018-01-12 DIAGNOSIS — N183 Chronic kidney disease, stage 3 (moderate): Secondary | ICD-10-CM | POA: Diagnosis not present

## 2018-01-12 DIAGNOSIS — E7800 Pure hypercholesterolemia, unspecified: Secondary | ICD-10-CM | POA: Diagnosis not present

## 2018-02-09 ENCOUNTER — Emergency Department (HOSPITAL_COMMUNITY): Payer: PPO

## 2018-02-09 ENCOUNTER — Encounter (HOSPITAL_COMMUNITY): Payer: Self-pay | Admitting: Emergency Medicine

## 2018-02-09 ENCOUNTER — Other Ambulatory Visit: Payer: Self-pay

## 2018-02-09 ENCOUNTER — Observation Stay (HOSPITAL_COMMUNITY)
Admission: EM | Admit: 2018-02-09 | Discharge: 2018-02-11 | Disposition: A | Discharge status: Home Health | Payer: PPO | Attending: Internal Medicine | Admitting: Internal Medicine

## 2018-02-09 DIAGNOSIS — K5900 Constipation, unspecified: Secondary | ICD-10-CM | POA: Diagnosis present

## 2018-02-09 DIAGNOSIS — Z853 Personal history of malignant neoplasm of breast: Secondary | ICD-10-CM | POA: Diagnosis not present

## 2018-02-09 DIAGNOSIS — E1169 Type 2 diabetes mellitus with other specified complication: Secondary | ICD-10-CM | POA: Diagnosis not present

## 2018-02-09 DIAGNOSIS — K219 Gastro-esophageal reflux disease without esophagitis: Secondary | ICD-10-CM | POA: Diagnosis not present

## 2018-02-09 DIAGNOSIS — R531 Weakness: Secondary | ICD-10-CM | POA: Diagnosis not present

## 2018-02-09 DIAGNOSIS — N390 Urinary tract infection, site not specified: Principal | ICD-10-CM | POA: Diagnosis present

## 2018-02-09 DIAGNOSIS — N289 Disorder of kidney and ureter, unspecified: Secondary | ICD-10-CM | POA: Diagnosis not present

## 2018-02-09 DIAGNOSIS — M6281 Muscle weakness (generalized): Secondary | ICD-10-CM | POA: Diagnosis not present

## 2018-02-09 DIAGNOSIS — I129 Hypertensive chronic kidney disease with stage 1 through stage 4 chronic kidney disease, or unspecified chronic kidney disease: Secondary | ICD-10-CM | POA: Insufficient documentation

## 2018-02-09 DIAGNOSIS — N183 Chronic kidney disease, stage 3 (moderate): Secondary | ICD-10-CM | POA: Diagnosis not present

## 2018-02-09 DIAGNOSIS — I10 Essential (primary) hypertension: Secondary | ICD-10-CM | POA: Diagnosis present

## 2018-02-09 DIAGNOSIS — D649 Anemia, unspecified: Secondary | ICD-10-CM | POA: Diagnosis not present

## 2018-02-09 DIAGNOSIS — N179 Acute kidney failure, unspecified: Secondary | ICD-10-CM | POA: Diagnosis not present

## 2018-02-09 DIAGNOSIS — R2689 Other abnormalities of gait and mobility: Secondary | ICD-10-CM | POA: Diagnosis not present

## 2018-02-09 DIAGNOSIS — E1122 Type 2 diabetes mellitus with diabetic chronic kidney disease: Secondary | ICD-10-CM | POA: Insufficient documentation

## 2018-02-09 DIAGNOSIS — Z7982 Long term (current) use of aspirin: Secondary | ICD-10-CM | POA: Insufficient documentation

## 2018-02-09 DIAGNOSIS — E785 Hyperlipidemia, unspecified: Secondary | ICD-10-CM

## 2018-02-09 DIAGNOSIS — Z7984 Long term (current) use of oral hypoglycemic drugs: Secondary | ICD-10-CM | POA: Insufficient documentation

## 2018-02-09 DIAGNOSIS — R404 Transient alteration of awareness: Secondary | ICD-10-CM | POA: Diagnosis not present

## 2018-02-09 DIAGNOSIS — Z79899 Other long term (current) drug therapy: Secondary | ICD-10-CM | POA: Insufficient documentation

## 2018-02-09 DIAGNOSIS — R110 Nausea: Secondary | ICD-10-CM | POA: Diagnosis not present

## 2018-02-09 DIAGNOSIS — R319 Hematuria, unspecified: Secondary | ICD-10-CM | POA: Diagnosis not present

## 2018-02-09 LAB — URINALYSIS, ROUTINE W REFLEX MICROSCOPIC
Bilirubin Urine: NEGATIVE
Glucose, UA: NEGATIVE mg/dL
Ketones, ur: NEGATIVE mg/dL
Nitrite: NEGATIVE
Protein, ur: NEGATIVE mg/dL
Specific Gravity, Urine: 1.015 (ref 1.005–1.030)
pH: 5 (ref 5.0–8.0)

## 2018-02-09 LAB — COMPREHENSIVE METABOLIC PANEL
ALK PHOS: 55 U/L (ref 38–126)
ALT: 11 U/L — ABNORMAL LOW (ref 14–54)
ANION GAP: 9 (ref 5–15)
AST: 16 U/L (ref 15–41)
Albumin: 3.1 g/dL — ABNORMAL LOW (ref 3.5–5.0)
BUN: 38 mg/dL — ABNORMAL HIGH (ref 6–20)
CALCIUM: 8.8 mg/dL — AB (ref 8.9–10.3)
CO2: 22 mmol/L (ref 22–32)
Chloride: 108 mmol/L (ref 101–111)
Creatinine, Ser: 2.43 mg/dL — ABNORMAL HIGH (ref 0.44–1.00)
GFR calc non Af Amer: 16 mL/min — ABNORMAL LOW (ref >60)
GFR, EST AFRICAN AMERICAN: 19 mL/min — AB (ref >60)
Glucose, Bld: 143 mg/dL — ABNORMAL HIGH (ref 65–99)
Potassium: 4.3 mmol/L (ref 3.5–5.1)
SODIUM: 139 mmol/L (ref 135–145)
Total Bilirubin: 0.5 mg/dL (ref 0.3–1.2)
Total Protein: 6 g/dL — ABNORMAL LOW (ref 6.5–8.1)

## 2018-02-09 LAB — CBC WITH DIFFERENTIAL/PLATELET
BASOS ABS: 0 10*3/uL (ref 0.0–0.1)
BASOS PCT: 0 %
Eosinophils Absolute: 0 10*3/uL (ref 0.0–0.7)
Eosinophils Relative: 1 %
HEMATOCRIT: 29.3 % — AB (ref 36.0–46.0)
HEMOGLOBIN: 9.6 g/dL — AB (ref 12.0–15.0)
Lymphocytes Relative: 19 %
Lymphs Abs: 1.2 10*3/uL (ref 0.7–4.0)
MCH: 28.8 pg (ref 26.0–34.0)
MCHC: 32.8 g/dL (ref 30.0–36.0)
MCV: 88 fL (ref 78.0–100.0)
MONOS PCT: 7 %
Monocytes Absolute: 0.4 10*3/uL (ref 0.1–1.0)
NEUTROS ABS: 4.7 10*3/uL (ref 1.7–7.7)
NEUTROS PCT: 73 %
Platelets: 209 10*3/uL (ref 150–400)
RBC: 3.33 MIL/uL — ABNORMAL LOW (ref 3.87–5.11)
RDW: 13.5 % (ref 11.5–15.5)
WBC: 6.3 10*3/uL (ref 4.0–10.5)

## 2018-02-09 LAB — GLUCOSE, CAPILLARY: GLUCOSE-CAPILLARY: 136 mg/dL — AB (ref 65–99)

## 2018-02-09 LAB — TROPONIN I

## 2018-02-09 LAB — LIPASE, BLOOD: Lipase: 22 U/L (ref 11–51)

## 2018-02-09 LAB — MRSA PCR SCREENING: MRSA BY PCR: NEGATIVE

## 2018-02-09 LAB — POC OCCULT BLOOD, ED: FECAL OCCULT BLD: NEGATIVE

## 2018-02-09 MED ORDER — ACETAMINOPHEN 650 MG RE SUPP: 650.0000 mg | Freq: Four times a day (QID) | RECTAL | Status: DC | PRN

## 2018-02-09 MED ORDER — ACETAMINOPHEN 325 MG PO TABS
650.0000 mg | ORAL_TABLET | Freq: Four times a day (QID) | ORAL | Status: DC | PRN
  Administered 2018-02-10: 650 mg via ORAL
  Filled 2018-02-09: qty 2

## 2018-02-09 MED ORDER — MILK AND MOLASSES ENEMA
1.0000 | Freq: Once | RECTAL | Status: AC
Start: 2018-02-09 — End: 2018-02-09
  Administered 2018-02-09: 250 mL via RECTAL

## 2018-02-09 MED ORDER — ONDANSETRON HCL 4 MG/2ML IJ SOLN: 4.0000 mg | Freq: Four times a day (QID) | INTRAMUSCULAR | Status: DC | PRN

## 2018-02-09 MED ORDER — CEFTRIAXONE SODIUM 1 G IJ SOLR
1.0000 g | Freq: Once | INTRAMUSCULAR | Status: AC
  Administered 2018-02-09: 1 g via INTRAVENOUS
  Filled 2018-02-09: qty 10

## 2018-02-09 MED ORDER — ASPIRIN 81 MG PO CHEW
81.0000 mg | CHEWABLE_TABLET | Freq: Every day | ORAL | Status: DC
  Administered 2018-02-10 – 2018-02-11 (×2): 81 mg via ORAL
  Filled 2018-02-09 (×2): qty 1

## 2018-02-09 MED ORDER — INSULIN ASPART 100 UNIT/ML ~~LOC~~ SOLN: 0.0000 [IU] | Freq: Three times a day (TID) | SUBCUTANEOUS | Status: DC

## 2018-02-09 MED ORDER — SODIUM CHLORIDE 0.9 % IV BOLUS (SEPSIS)
1000.0000 mL | Freq: Once | INTRAVENOUS | Status: AC
  Administered 2018-02-09: 1000 mL via INTRAVENOUS

## 2018-02-09 MED ORDER — EZETIMIBE 10 MG PO TABS
10.0000 mg | ORAL_TABLET | Freq: Every day | ORAL | Status: DC
  Administered 2018-02-10 – 2018-02-11 (×2): 10 mg via ORAL
  Filled 2018-02-09 (×2): qty 1

## 2018-02-09 MED ORDER — PANTOPRAZOLE SODIUM 40 MG PO TBEC
40.0000 mg | DELAYED_RELEASE_TABLET | Freq: Every day | ORAL | Status: DC
  Administered 2018-02-10 – 2018-02-11 (×2): 40 mg via ORAL
  Filled 2018-02-09 (×2): qty 1

## 2018-02-09 MED ORDER — SODIUM CHLORIDE 0.9 % IV SOLN
1.0000 g | INTRAVENOUS | Status: DC
  Administered 2018-02-10: 1 g via INTRAVENOUS
  Filled 2018-02-09 (×2): qty 10
  Filled 2018-02-09: qty 1

## 2018-02-09 MED ORDER — ONDANSETRON HCL 4 MG PO TABS: 4.0000 mg | ORAL_TABLET | Freq: Four times a day (QID) | ORAL | Status: DC | PRN

## 2018-02-09 MED ORDER — HEPARIN SODIUM (PORCINE) 5000 UNIT/ML IJ SOLN
5000.0000 [IU] | Freq: Three times a day (TID) | INTRAMUSCULAR | Status: DC
  Administered 2018-02-09 – 2018-02-11 (×5): 5000 [IU] via SUBCUTANEOUS
  Filled 2018-02-09 (×5): qty 1

## 2018-02-09 MED ORDER — TIMOLOL MALEATE 0.5 % OP SOLN
1.0000 [drp] | Freq: Two times a day (BID) | OPHTHALMIC | Status: DC
  Administered 2018-02-10 – 2018-02-11 (×3): 1 [drp] via OPHTHALMIC
  Filled 2018-02-09: qty 5

## 2018-02-09 MED ORDER — MECLIZINE HCL 12.5 MG PO TABS: 25.0000 mg | ORAL_TABLET | Freq: Three times a day (TID) | ORAL | Status: DC | PRN

## 2018-02-09 MED ORDER — SODIUM CHLORIDE 0.9 % IV SOLN
INTRAVENOUS | Status: DC
  Administered 2018-02-09 – 2018-02-11 (×2): via INTRAVENOUS

## 2018-02-09 MED ORDER — INSULIN ASPART 100 UNIT/ML ~~LOC~~ SOLN: 0.0000 [IU] | Freq: Every day | SUBCUTANEOUS | Status: DC

## 2018-02-09 MED ORDER — ONDANSETRON HCL 4 MG/2ML IJ SOLN
4.0000 mg | Freq: Once | INTRAMUSCULAR | Status: AC
  Administered 2018-02-09: 4 mg via INTRAVENOUS
  Filled 2018-02-09: qty 2

## 2018-02-09 MED ORDER — AMLODIPINE BESYLATE 5 MG PO TABS
10.0000 mg | ORAL_TABLET | Freq: Every day | ORAL | Status: DC
  Administered 2018-02-10 – 2018-02-11 (×2): 10 mg via ORAL
  Filled 2018-02-09 (×2): qty 2

## 2018-02-09 MED ORDER — CLONAZEPAM 0.5 MG PO TABS
1.0000 mg | ORAL_TABLET | Freq: Two times a day (BID) | ORAL | Status: DC | PRN
  Administered 2018-02-09 – 2018-02-10 (×2): 1 mg via ORAL
  Filled 2018-02-09 (×2): qty 2

## 2018-02-09 MED ORDER — POLYETHYLENE GLYCOL 3350 17 G PO PACK
17.0000 g | PACK | Freq: Every day | ORAL | Status: DC
  Administered 2018-02-10 – 2018-02-11 (×2): 17 g via ORAL
  Filled 2018-02-09 (×2): qty 1

## 2018-02-09 NOTE — ED Triage Notes (Addendum)
Patient brought in via EMS from [facility]. Alert and oriented. Patient c/o nausea. Denies any abd pain, vomiting, diarrhea, or fevers. Patient recently taking Levaquin for pneumonia, last dose yesterday. Per patient nausea x2-3 days. Patient states that she has not had BM in 3 days-(normally once a day). Per EMS blood sugar 137.

## 2018-02-09 NOTE — H&P (Addendum)
History and Physical    Nancy Medina [id number] DOB: [birth year] DOA: 02/09/2018  PCP: Lemmie Evens, MD  Patient coming from: [name]  I have personally briefly reviewed patient's old medical records in [place]  Chief Complaint: Nausea  HPI: Nancy Medina is a 84 y.o. female with medical history significant of hypertension, diabetes, reported chronic kidney disease stage III (baseline creatinine is unknown), presents to the hospital with complaints of nausea.  She reports that for the past 3-4 days she been having dry heaves without any frank vomiting.  She has been somewhat constipated and required a suppository 3 days ago.  P.o. intake has been somewhat poor.  She is not having shortness of breath, cough, chest pain, dysuria.  She has not had any fevers.  ED Course: On arrival to the emergency room, creatinine is noted to be elevated at 2.48.  Baseline creatinine is unknown.  Lipase noted to be normal.  Stool for occult blood is negative.  Urinalysis indicates possible infection.  Acute abdominal series does not indicate any pneumonia and no signs of bowel obstruction.  Clinically, she appears to be dehydrated.  She is dizzy on standing and feels generally weak.  She is been referred for observation.  Review of Systems: As per HPI otherwise 10 point review of systems negative.    Past Medical History:  Diagnosis Date  . Achilles tendinitis   . Allergic rhinitis   . Anemia of chronic disease    at least back to 2010  . Anxiety   . Benign paroxysmal positional vertigo   . Cataract   . Cerebrovascular accident ([place])   . Chronic kidney disease (CKD), stage III (moderate) (HCC)   . Depression   . DJD (degenerative joint disease)   . DM (diabetes mellitus) ([place])    type II  . GERD (gastroesophageal reflux disease)   . HTN (hypertension)   . HX: breast cancer   . Hyperlipidemia   . Hypertonicity of bladder   . Knee pain, left   . Leg pain   .  Lymphocytic colitis 2008   treated with Lialda short-term  . Obesity   . Osteoporosis, unspecified   . Renal disease    stage III  . Unspecified fall   . Unspecified glaucoma(365.9)     Past Surgical History:  Procedure Laterality Date  . Bladder tack  2000  . BREAST LUMPECTOMY  2004   right  . cataracts    . CHOLECYSTECTOMY  1960  . COLONOSCOPY  07/2007   lymphocytic colitis  . ESOPHAGOGASTRODUODENOSCOPY  06/24/2011   Procedure: ESOPHAGOGASTRODUODENOSCOPY (EGD);  Surgeon: Dorothyann Peng, MD;  [facility];  Service: Endoscopy;  Laterality: N/A;.  Peptic stricture, mild erosive reflux esophagitis, hiatal  . FLEXIBLE SIGMOIDOSCOPY  11/25/2011   MODERATE DIVERTICULOSIS/INTERNAL HEMORRHOIDS  . S/P Hysterectomy  2000   benign reasons  . SAVORY DILATION  06/24/2011   Procedure: SAVORY DILATION;  Surgeon: Dorothyann Peng, MD;  [facility];  Service: Endoscopy;  Laterality: N/A;     reports that she has quit smoking. Her smoking use included cigarettes. She smoked 1.00 pack per day. She has never used smokeless tobacco. She reports that she does not drink alcohol or use drugs.  Allergies  Allergen Reactions  . Metformin     REACTION: Felt poor    Family History  Problem Relation Age of Onset  . Colon cancer Neg Hx           .  Liver disease Neg Hx           . GI problems Neg Hx              Prior to Admission medications   Medication Sig Start Date End Date Taking? Authorizing Provider  amLODipine (NORVASC) 10 MG tablet Take 10 mg by mouth daily.     Yes [provider]  aspirin 81 MG tablet Take 81 mg by mouth daily.     Yes [provider]  cholecalciferol (VITAMIN D) 1000 units tablet Take 2,000 Units by mouth daily.   Yes [provider]  Cholecalciferol (VITAMIN D-1000 MAX ST) 1000 UNITS tablet Take 5,000 Units by mouth daily.    Yes [provider]  clonazePAM (KLONOPIN) 1 MG tablet Take 1 mg by mouth 2 (two) times  daily as needed.     Yes [provider]  Cyanocobalamin (VITAMIN B-12 PO) Take 1,500 mg by mouth.     Yes [provider]  ezetimibe (ZETIA) 10 MG tablet Take 1 tablet by mouth daily. 01/24/18  Yes [provider]  fish oil-omega-3 fatty acids 1000 MG capsule Take 2 g by mouth daily.     Yes [provider]  glimepiride (AMARYL) 4 MG tablet Take 2 mg by mouth daily.  05/18/11  Yes [provider]  hydrochlorothiazide (HYDRODIURIL) 25 MG tablet Take 1 tablet by mouth daily. 02/05/18  Yes [provider]  KLOR-CON 10 10 MEQ tablet Take 1 tablet by mouth daily. 02/05/18  Yes [provider]  latanoprost (XALATAN) 0.005 % ophthalmic solution  06/02/11  Yes [provider]  loperamide (IMODIUM A-D) 2 MG tablet Take 2 mg by mouth 4 (four) times daily as needed for diarrhea or loose stools.   Yes [provider]  losartan (COZAAR) 100 MG tablet Take 100 mg by mouth daily.  05/18/11  Yes [provider]  meclizine (ANTIVERT) 25 MG tablet Take 25 mg by mouth 3 (three) times daily as needed.     Yes [provider]  omeprazole (PRILOSEC) 40 MG capsule Take 1 capsule by mouth daily. 01/23/18  Yes [provider]  oseltamivir (TAMIFLU) 75 MG capsule Take 75 mg by mouth daily.   Yes [provider]  timolol (TIMOPTIC) 0.5 % ophthalmic solution Place 1 drop into both eyes 2 (two) times daily.  01/17/18  Yes [provider]    Physical Exam: Vitals:   02/09/18 1408 02/09/18 1430 02/09/18 1501 02/09/18 1525  BP: 127/60 113/61 (!) 124/52 (!) 124/49  Pulse: 67  61 65  Resp: 16  16 16   Temp: 98.1 F (36.7 C)   (!) 97.5 F (36.4 C)  TempSrc: Oral   Axillary  SpO2: 95%  93% 96%  Weight:    76.2 kg (167 lb 15.9 oz)  Height:    5\' 2"  (1.575 m)    Constitutional: NAD, calm, comfortable Vitals:   02/09/18 1408 02/09/18 1430 02/09/18 1501 02/09/18 1525  BP: 127/60 113/61 (!) 124/52 (!) 124/49    Pulse: 67  61 65  Resp: 16  16 16   Temp: 98.1 F (36.7 C)   (!) 97.5 F (36.4 C)  TempSrc: Oral   Axillary  SpO2: 95%  93% 96%  Weight:    76.2 kg (167 lb 15.9 oz)  Height:    5\' 2"  (1.575 m)   Eyes: PERRL, lids and conjunctivae normal ENMT: Mucous membranes are moist. Posterior pharynx clear of any exudate or lesions.Normal  dentition.  Neck: normal, supple, no masses, no thyromegaly Respiratory: clear to auscultation bilaterally, no wheezing, no crackles. Normal respiratory effort. No accessory muscle use.  Cardiovascular: Regular rate and rhythm, no murmurs / rubs / gallops. No extremity edema. 2+ pedal pulses. No carotid bruits.  Abdomen: no tenderness, no masses palpated. No hepatosplenomegaly. Bowel sounds positive.  Musculoskeletal: no clubbing / cyanosis. No joint deformity upper and lower extremities. Good ROM, no contractures. Normal muscle tone.  Skin: no rashes, lesions, ulcers. No induration Neurologic: CN 2-12 grossly intact. Sensation intact, DTR normal. Strength 5/5 in all 4.  Psychiatric: Normal judgment and insight. Alert and oriented x 3. Normal mood.   Labs on Admission: I have personally reviewed following labs and imaging studies  CBC: Recent Labs  Lab 02/09/18 1219  WBC 6.3  NEUTROABS 4.7  HGB 9.6*  HCT 29.3*  MCV 88.0  PLT 573   Basic Metabolic Panel: Recent Labs  Lab 02/09/18 1219  NA 139  K 4.3  CL 108  CO2 22  GLUCOSE 143*  BUN 38*  CREATININE 2.43*  CALCIUM 8.8*   GFR: Estimated Creatinine Clearance: 14.7 mL/min (A) (by C-G formula based on SCr of 2.43 mg/dL (H)). Liver Function Tests: Recent Labs  Lab 02/09/18 1219  AST 16  ALT 11*  ALKPHOS 55  BILITOT 0.5  PROT 6.0*  ALBUMIN 3.1*   Recent Labs  Lab 02/09/18 1219  LIPASE 22   No results for input(s): AMMONIA in the last 168 hours. Coagulation Profile: No results for input(s): INR, PROTIME in the last 168 hours. Cardiac Enzymes: Recent Labs  Lab 02/09/18 1219   TROPONINI <0.03   BNP (last 3 results) No results for input(s): PROBNP in the last 8760 hours. HbA1C: No results for input(s): HGBA1C in the last 72 hours. CBG: No results for input(s): GLUCAP in the last 168 hours. Lipid Profile: No results for input(s): CHOL, HDL, LDLCALC, TRIG, CHOLHDL, LDLDIRECT in the last 72 hours. Thyroid Function Tests: No results for input(s): TSH, T4TOTAL, FREET4, T3FREE, THYROIDAB in the last 72 hours. Anemia Panel: No results for input(s): VITAMINB12, FOLATE, FERRITIN, TIBC, IRON, RETICCTPCT in the last 72 hours. Urine analysis:    Component Value Date/Time   COLORURINE YELLOW 02/09/2018 1200   APPEARANCEUR TURBID (A) 02/09/2018 1200   LABSPEC 1.015 02/09/2018 1200   PHURINE 5.0 02/09/2018 1200   GLUCOSEU NEGATIVE 02/09/2018 1200   HGBUR MODERATE (A) 02/09/2018 1200   HGBUR negative 06/04/2008 0811   BILIRUBINUR NEGATIVE 02/09/2018 1200   KETONESUR NEGATIVE 02/09/2018 1200   PROTEINUR NEGATIVE 02/09/2018 1200   UROBILINOGEN 0.2 06/04/2008 0811   NITRITE NEGATIVE 02/09/2018 1200   LEUKOCYTESUR LARGE (A) 02/09/2018 1200    Radiological Exams on Admission: Dg Abd Acute W/chest  Result Date: 02/09/2018 CLINICAL DATA:  Nausea. No bowel movement. History of breast cancer. EXAM: DG ABDOMEN ACUTE W/ 1V CHEST COMPARISON:  No recent prior. FINDINGS: Cardiomegaly with normal pulmonary vascularity. No focal infiltrate. No pleural effusion or pneumothorax. Vascular calcifications noted. Surgical clips noted over the abdomen. No bowel distention or free air. Tiny sclerotic density noted over the right femoral head again noted consistent small bone island. No acute bony abnormality. IMPRESSION: 1. Cardiomegaly with normal pulmonary vascularity. No focal infiltrate. No pleural effusion or pneumothorax. 2.  No acute intra-abdominal abnormality. Electronically Signed   By: Marcello Moores  Register   On: 02/09/2018 13:17    Assessment/Plan Active Problems:   Type 2  diabetes mellitus with hyperlipidemia ([place])   Essential hypertension  GERD   AKI (acute kidney injury) ([place])   Acute lower UTI   Nausea   Constipation    1. Acute kidney injury.  Unclear baseline.  Possibly has some degree of prerenal from dehydration.  Start on IV fluids.  If no significant change by morning, may need to pursue further workup including renal ultrasound. 2. Possible urinary tract infection.  Urine culture has been sent.  Continue on Rocephin for now. 3. Constipation.  Will give patient an enema.  Start on [place] daily. 4. Diabetes.  Hold oral agents.  Start on sliding scale insulin. 5. Hyperlipidemia.  Continue on Zetia. 6. Hypertension.  Hold ARB in the setting of kidney failure..  Continue on amlodipine. 7. GERD.  Continue on PPI 8. Generalized weakness.  Likely related to dehydration.  Patient reports having a fall the day before yesterday.  Will get physical therapy evaluation.  DVT prophylaxis: Heparin Code Status: DNR Family Communication: No family present Disposition Plan: Return to [facility] assisted living, possibly in a.m. Consults called:  Admission status: medsurg/observation   Kathie Dike MD [facility] Pager [phone]  If 7PM-7AM, please contact night-coverage [email or web address] Password [facility]  02/09/2018, 7:14 PM

## 2018-02-09 NOTE — ED Notes (Signed)
Pt transported to Xray. 

## 2018-02-09 NOTE — ED Provider Notes (Addendum)
[facility] EMERGENCY DEPARTMENT Provider Note   CSN: [id number] Arrival date & time: 02/09/18  1003     History   Chief Complaint Chief Complaint  Patient presents with  . Nausea    HPI Nancy Medina is a 84 y.o. female.  HPI Complains of nausea onset 3 days ago.  States last bowel movement was 3 or 4 days ago.  Other associated symptoms include generalized weakness no other associated symptoms.  No fever.  No treatment prior to coming here.  No abdominal pain or headache no chest pain. Past Medical History:  Diagnosis Date  . Achilles tendinitis   . Allergic rhinitis   . Anemia of chronic disease    at least back to 2010  . Anxiety   . Benign paroxysmal positional vertigo   . Cataract   . Cerebrovascular accident ([place])   . Chronic kidney disease (CKD), stage III (moderate) (HCC)   . Depression   . DJD (degenerative joint disease)   . DM (diabetes mellitus) ([place])    type II  . GERD (gastroesophageal reflux disease)   . HTN (hypertension)   . HX: breast cancer   . Hyperlipidemia   . Hypertonicity of bladder   . Knee pain, left   . Leg pain   . Lymphocytic colitis 2008   treated with Lialda short-term  . Obesity   . Osteoporosis, unspecified   . Renal disease    stage III  . Unspecified fall   . Unspecified glaucoma(365.9)     Patient Active Problem List   Diagnosis Date Noted  . Diarrhea 11/25/2011  . Lymphocytic colitis 06/15/2011  . Esophageal dysphagia 06/15/2011  . UNSPECIFIED ANEMIA 04/27/2009  . DIASTOLIC DYSFUNCTION 40/98/1191  . ACHILLES TENDINITIS 10/27/2008  . PERIPHERAL EDEMA 10/14/2008  . ALLERGIC RHINITIS, SEASONAL 03/26/2008  . RENAL DISEASE, CHRONIC, STAGE III 03/26/2008  . DEGENERATIVE DISC DISEASE 01/15/2008  . DIABETES MELLITUS, TYPE II, CONTROLLED 12/13/2007  . HYPERLIPIDEMIA 10/11/2006  . OBESITY NOS 10/11/2006  . ANXIETY 10/11/2006  . DEPRESSION 10/11/2006  . GLAUCOMA NOS 10/11/2006  . BENIGN POSITIONAL VERTIGO 10/11/2006    . HYPERTENSION 10/11/2006  . GERD 10/11/2006  . OVERACTIVE BLADDER 10/11/2006  . OSTEOPOROSIS 10/11/2006  . BREAST CANCER, HX OF 10/11/2006  . CEREBROVASCULAR ACCIDENT, HX OF 10/11/2006    Past Surgical History:  Procedure Laterality Date  . Bladder tack  2000  . BREAST LUMPECTOMY  2004   right  . cataracts    . CHOLECYSTECTOMY  1960  . COLONOSCOPY  07/2007   lymphocytic colitis  . ESOPHAGOGASTRODUODENOSCOPY  06/24/2011   Procedure: ESOPHAGOGASTRODUODENOSCOPY (EGD);  Surgeon: Dorothyann Peng, MD;  Location: [facility];  Service: Endoscopy;  Laterality: N/A;.  Peptic stricture, mild erosive reflux esophagitis, hiatal  . FLEXIBLE SIGMOIDOSCOPY  11/25/2011   MODERATE DIVERTICULOSIS/INTERNAL HEMORRHOIDS  . S/P Hysterectomy  2000   benign reasons  . SAVORY DILATION  06/24/2011   Procedure: SAVORY DILATION;  Surgeon: Dorothyann Peng, MD;  Location: [facility] ENDO SUITE;  Service: Endoscopy;  Laterality: N/A;    OB History   None      Home Medications    Prior to Admission medications   Medication Sig Start Date End Date Taking? Authorizing Provider  amLODipine (NORVASC) 10 MG tablet Take 10 mg by mouth daily.      [provider]  aspirin 81 MG tablet Take 81 mg by mouth daily.      [provider]  budesonide (ENTOCORT EC) 3 MG  24 hr capsule 3 capsules po daily for six weeks, then 2 capsules po daily for two weeks, then 1 capsule daily for two weeks, then stop. 09/26/11   Mahala Menghini, PA-C  Calcium Carbonate-Vit D-Min (CALTRATE 600+D PLUS) 600-400 MG-UNIT per tablet Take 1 tablet by mouth daily.      [provider]  candesartan-hydrochlorothiazide (ATACAND HCT) 32-12.5 MG per tablet Take 1 tablet by mouth daily.      [provider]  Cholecalciferol (VITAMIN D-1000 MAX ST) 1000 UNITS tablet Take 1,000 Units by mouth daily.      [provider]  citalopram (CELEXA) 20 MG tablet Take 20 mg by mouth daily.      [provider]   clonazePAM (KLONOPIN) 1 MG tablet Take 1 mg by mouth 2 (two) times daily as needed.      [provider]  Cyanocobalamin (VITAMIN B-12 PO) Take 1,500 mg by mouth.      [provider]  esomeprazole (NEXIUM) 40 MG capsule 1 PO 30 MINUTES PRIOR TO BREAKFAST 03/08/12   Fields, Sandi L, MD  ezetimibe (ZETIA) 10 MG tablet Take 1 tablet by mouth daily. 01/24/18   [provider]  fish oil-omega-3 fatty acids 1000 MG capsule Take 2 g by mouth daily.      [provider]  furosemide (LASIX) 20 MG tablet 20 mg daily.  05/18/11   [provider]  glimepiride (AMARYL) 4 MG tablet 4 mg daily.  05/18/11   [provider]  hydrochlorothiazide (HYDRODIURIL) 25 MG tablet Take 1 tablet by mouth daily. 02/05/18   [provider]  KLOR-CON 10 10 MEQ tablet Take 1 tablet by mouth daily. 02/05/18   [provider]  latanoprost (XALATAN) 0.005 % ophthalmic solution  06/02/11   [provider]  levofloxacin (LEVAQUIN) 500 MG tablet Take 1 tablet by mouth daily. 02/02/18   [provider]  losartan (COZAAR) 100 MG tablet Take 100 mg by mouth daily.  05/18/11   [provider]  meclizine (ANTIVERT) 25 MG tablet Take 25 mg by mouth 3 (three) times daily as needed.      [provider]  metoprolol (TOPROL-XL) 100 MG 24 hr tablet Take 100 mg by mouth daily. 1/2 tablet     [provider]  Multiple Vitamin (MULTIVITAMIN) capsule Take 1 capsule by mouth daily.      [provider]  omeprazole (PRILOSEC) 40 MG capsule Take 1 capsule by mouth daily. 01/23/18   [provider]  timolol (TIMOPTIC) 0.5 % ophthalmic solution  01/17/18   [provider]    Family History Family History  Problem Relation Age of Onset  . Colon cancer Neg Hx           . Liver disease Neg Hx           . GI problems Neg Hx             Social History Social History   Tobacco Use  . Smoking status: Former  Smoker    Packs/day: 1.00    Types: Cigarettes  . Smokeless tobacco: Never Used  . Tobacco comment: 30 yrs ago  Substance Use Topics  . Alcohol use: No  . Drug use: No    Lives in assisted living facility Allergies   Metformin   Review of Systems Review of Systems  Gastrointestinal: Positive for nausea.  Musculoskeletal: Positive for gait problem.       Walks with walker  Allergic/Immunologic: Positive for immunocompromised state.       Diabetic  Neurological: Positive for weakness.  All other systems reviewed and are negative.    Physical Exam Updated Vital Signs BP (!) 122/55   Pulse 61   Temp 98.1 F (36.7 C) (Oral)   Resp 16   Ht 5\' 2"  (1.575 m)   Wt 73.9 kg (163 lb)   SpO2 93%   BMI 29.81 kg/m   Physical Exam  Constitutional: She is oriented to person, place, and time. She appears well-developed and well-nourished.  HENT:  Head: Normocephalic and atraumatic.  Mucous membranes dry  Eyes: Pupils are equal, round, and reactive to light. Conjunctivae are normal.  Neck: Neck supple. No tracheal deviation present. No thyromegaly present.  Cardiovascular: Normal rate and regular rhythm.  No murmur heard. Pulmonary/Chest: Effort normal and breath sounds normal.  Abdominal: Soft. Bowel sounds are normal. She exhibits no distension. There is no tenderness.  Genitourinary: Rectal exam shows guaiac negative stool.  Genitourinary Comments: Is on normal tone soft brown stool no gross blood  Musculoskeletal: Normal range of motion. She exhibits no edema or tenderness.  Neurological: She is alert and oriented to person, place, and time. Coordination normal.  Skin: Skin is warm and dry. No rash noted.  Psychiatric: She has a normal mood and affect.  Nursing note and vitals reviewed.    ED Treatments / Results  Labs (all labs ordered are listed, but only abnormal results are displayed) Labs Reviewed  URINALYSIS, ROUTINE W REFLEX MICROSCOPIC - Abnormal; Notable for  the following components:      Result Value   APPearance TURBID (*)    Hgb urine dipstick MODERATE (*)    Leukocytes, UA LARGE (*)    Bacteria, UA RARE (*)    Squamous Epithelial / LPF 6-30 (*)    All other components within normal limits  CBC WITH DIFFERENTIAL/PLATELET - Abnormal; Notable for the following components:   RBC 3.33 (*)    Hemoglobin 9.6 (*)    HCT 29.3 (*)    All other components within normal limits  COMPREHENSIVE METABOLIC PANEL  LIPASE, BLOOD  TROPONIN I  POC OCCULT BLOOD, ED   Results for orders placed or performed during the hospital encounter of 02/09/18  Urinalysis, Routine w reflex microscopic  Result Value Ref Range   Color, Urine YELLOW YELLOW   APPearance TURBID (A) CLEAR   Specific Gravity, Urine 1.015 1.005 - 1.030   pH 5.0 5.0 - 8.0   Glucose, UA NEGATIVE NEGATIVE mg/dL   Hgb urine dipstick MODERATE (A) NEGATIVE   Bilirubin Urine NEGATIVE NEGATIVE   Ketones, ur NEGATIVE NEGATIVE mg/dL   Protein, ur NEGATIVE NEGATIVE mg/dL   Nitrite NEGATIVE NEGATIVE   Leukocytes, UA LARGE (A) NEGATIVE   RBC / HPF TOO NUMEROUS TO COUNT 0 - 5 RBC/hpf   WBC, UA TOO NUMEROUS TO COUNT 0 - 5 WBC/hpf   Bacteria, UA RARE (A) NONE SEEN   Squamous Epithelial / LPF 6-30 (A) NONE SEEN  Comprehensive metabolic panel  Result Value Ref Range   Sodium 139 135 - 145 mmol/L   Potassium 4.3 3.5 - 5.1 mmol/L   Chloride 108 101 - 111 mmol/L   CO2 22 22 - 32 mmol/L   Glucose, Bld 143 (H) 65 - 99 mg/dL   BUN 38 (H) 6 - 20 mg/dL   Creatinine, Ser 2.43 (H) 0.44 - 1.00 mg/dL   Calcium 8.8 (L) 8.9 - 10.3 mg/dL   Total Protein  6.0 (L) 6.5 - 8.1 g/dL   Albumin 3.1 (L) 3.5 - 5.0 g/dL   AST 16 15 - 41 U/L   ALT 11 (L) 14 - 54 U/L   Alkaline Phosphatase 55 38 - 126 U/L   Total Bilirubin 0.5 0.3 - 1.2 mg/dL   GFR calc non Af Amer 16 (L) >60 mL/min   GFR calc Af Amer 19 (L) >60 mL/min   Anion gap 9 5 - 15  CBC with Differential/Platelet  Result Value Ref Range   WBC 6.3 4.0 - 10.5  K/uL   RBC 3.33 (L) 3.87 - 5.11 MIL/uL   Hemoglobin 9.6 (L) 12.0 - 15.0 g/dL   HCT 29.3 (L) 36.0 - 46.0 %   MCV 88.0 78.0 - 100.0 fL   MCH 28.8 26.0 - 34.0 pg   MCHC 32.8 30.0 - 36.0 g/dL   RDW 13.5 11.5 - 15.5 %   Platelets 209 150 - 400 K/uL   Neutrophils Relative % 73 %   Neutro Abs 4.7 1.7 - 7.7 K/uL   Lymphocytes Relative 19 %   Lymphs Abs 1.2 0.7 - 4.0 K/uL   Monocytes Relative 7 %   Monocytes Absolute 0.4 0.1 - 1.0 K/uL   Eosinophils Relative 1 %   Eosinophils Absolute 0.0 0.0 - 0.7 K/uL   Basophils Relative 0 %   Basophils Absolute 0.0 0.0 - 0.1 K/uL  Lipase, blood  Result Value Ref Range   Lipase 22 11 - 51 U/L  Troponin I  Result Value Ref Range   Troponin I <0.03 <0.03 ng/mL  POC occult blood, ED Provider will collect  Result Value Ref Range   Fecal Occult Bld NEGATIVE NEGATIVE   Dg Abd Acute W/chest  Result Date: 02/09/2018 CLINICAL DATA:  Nausea. No bowel movement. History of breast cancer. EXAM: DG ABDOMEN ACUTE W/ 1V CHEST COMPARISON:  No recent prior. FINDINGS: Cardiomegaly with normal pulmonary vascularity. No focal infiltrate. No pleural effusion or pneumothorax. Vascular calcifications noted. Surgical clips noted over the abdomen. No bowel distention or free air. Tiny sclerotic density noted over the right femoral head again noted consistent small bone island. No acute bony abnormality. IMPRESSION: 1. Cardiomegaly with normal pulmonary vascularity. No focal infiltrate. No pleural effusion or pneumothorax. 2.  No acute intra-abdominal abnormality. Electronically Signed   By: Marcello Moores  Register   On: 02/09/2018 13:17   EKG  EKG Interpretation None       Radiology No results found.  Procedures Procedures (including critical care time)  Medications Ordered in ED Medications  ondansetron (ZOFRAN) injection 4 mg (4 mg Intravenous Given 02/09/18 1039)  Medical decision making: X-rays viewed by me.  Clinically patient has no signs of bowel obstruction or  fecal impaction. Lab work consistent with urinary tract infection and renal insufficiency and anemia.  Intravenous antibiotics ordered as well as urine culture. Initial Impression / Assessment and Plan / ED Course  I have reviewed the triage vital signs and the nursing notes.  Pertinent labs & imaging results that were available during my care of the patient were reviewed by me and considered in my medical decision making (see chart for details).    2 PM she feels improved after treatment with intravenous Zofran and intravenous hydration with normal saline.  Of consulted Dr.Memon hospitalist service will arrange for overnight stay   Final Clinical Impressions(s) / ED Diagnoses  Diagnoses #1 urinary tract infection with hematuria #2 weakness #3 renal insufficiency Final diagnoses:  None  #4 anemia  ED Discharge Orders    None       Orlie Dakin, MD 02/09/18 1411    Orlie Dakin, MD 02/09/18 [phone]

## 2018-02-10 DIAGNOSIS — R319 Hematuria, unspecified: Secondary | ICD-10-CM | POA: Diagnosis not present

## 2018-02-10 DIAGNOSIS — N179 Acute kidney failure, unspecified: Secondary | ICD-10-CM | POA: Diagnosis not present

## 2018-02-10 DIAGNOSIS — K5900 Constipation, unspecified: Secondary | ICD-10-CM | POA: Diagnosis not present

## 2018-02-10 DIAGNOSIS — K219 Gastro-esophageal reflux disease without esophagitis: Secondary | ICD-10-CM | POA: Diagnosis not present

## 2018-02-10 DIAGNOSIS — E785 Hyperlipidemia, unspecified: Secondary | ICD-10-CM | POA: Diagnosis not present

## 2018-02-10 DIAGNOSIS — I10 Essential (primary) hypertension: Secondary | ICD-10-CM | POA: Diagnosis not present

## 2018-02-10 DIAGNOSIS — E1169 Type 2 diabetes mellitus with other specified complication: Secondary | ICD-10-CM | POA: Diagnosis not present

## 2018-02-10 DIAGNOSIS — N390 Urinary tract infection, site not specified: Secondary | ICD-10-CM | POA: Diagnosis not present

## 2018-02-10 LAB — CBC
HCT: 28.8 % — ABNORMAL LOW (ref 36.0–46.0)
HEMOGLOBIN: 9.3 g/dL — AB (ref 12.0–15.0)
MCH: 28.9 pg (ref 26.0–34.0)
MCHC: 32.3 g/dL (ref 30.0–36.0)
MCV: 89.4 fL (ref 78.0–100.0)
Platelets: 215 10*3/uL (ref 150–400)
RBC: 3.22 MIL/uL — ABNORMAL LOW (ref 3.87–5.11)
RDW: 13.6 % (ref 11.5–15.5)
WBC: 5.6 10*3/uL (ref 4.0–10.5)

## 2018-02-10 LAB — BASIC METABOLIC PANEL
ANION GAP: 7 (ref 5–15)
BUN: 35 mg/dL — ABNORMAL HIGH (ref 6–20)
CALCIUM: 8.5 mg/dL — AB (ref 8.9–10.3)
CO2: 22 mmol/L (ref 22–32)
Chloride: 108 mmol/L (ref 101–111)
Creatinine, Ser: 2.32 mg/dL — ABNORMAL HIGH (ref 0.44–1.00)
GFR calc Af Amer: 20 mL/min — ABNORMAL LOW (ref >60)
GFR, EST NON AFRICAN AMERICAN: 17 mL/min — AB (ref >60)
GLUCOSE: 67 mg/dL (ref 65–99)
Potassium: 3.8 mmol/L (ref 3.5–5.1)
Sodium: 137 mmol/L (ref 135–145)

## 2018-02-10 LAB — GLUCOSE, CAPILLARY
GLUCOSE-CAPILLARY: 55 mg/dL — AB (ref 65–99)
GLUCOSE-CAPILLARY: 54 mg/dL — AB (ref 65–99)
GLUCOSE-CAPILLARY: 81 mg/dL (ref 65–99)
Glucose-Capillary: 100 mg/dL — ABNORMAL HIGH (ref 65–99)
Glucose-Capillary: 44 mg/dL — CL (ref 65–99)

## 2018-02-10 NOTE — Progress Notes (Signed)
Blood Sugar 44. Pt given juice and peanut butter and crackers to eat. Blood sugar up to 81.

## 2018-02-10 NOTE — Progress Notes (Signed)
PROGRESS NOTE    Nancy Medina  BXU:[id number] DOB: [birth year] DOA: 02/09/2018 PCP: Lemmie Evens, MD    Brief Narrative:  84 year old female with a history of hypertension and diabetes, presents to the hospital with nausea and generalized weakness.  Found to be mildly dehydrated and possible urinary tract infection.  Started on intravenous antibiotics and IV fluids.  Clinically she is improving, but creatinine remains elevated.  Further workup including renal ultrasound has been ordered.  Physical therapy evaluation for generalized weakness.  Anticipate discharge in the next 24 hours if renal function continues to improve.   Assessment & Plan:   Active Problems:   Type 2 diabetes mellitus with hyperlipidemia (HCC)   Essential hypertension   GERD   AKI (acute kidney injury) ([place])   Acute lower UTI   Nausea   Constipation   1. Acute kidney injury.  Unclear what her baseline renal function is.  Continue IV fluids for now.  Renal function is relatively unchanged since yesterday.  Will check renal ultrasound.  Recheck labs in a.m.  Follow-up urine output.  Intake and output may not be accurately recorded since she is often incontinent. 2. Urinary tract infection.  Unfortunately, urine culture was not sent yesterday.  This has been ordered.  She is currently on Rocephin.  Will likely discontinue antibiotics after tomorrow's dose. 3. Constipation.  Continue on MiraLAX. 4. Diabetes.  Oral agents currently on hold.  She is having episodes of hypoglycemia.  Continue to monitor on sliding scale. 5. Hypertension.  Hold ARB in the setting of renal failure.  Continue on amlodipine. 6. GERD.  Continue PPI. 7. Generalized weakness.  Physical therapy evaluation pending.   DVT prophylaxis: Heparin Code Status: DNR Family Communication: Discussed with daughter at the bedside Disposition Plan: Likely return to [facility] in a.m. if renal function is better.   Consultants:     Procedures:       Antimicrobials:   Rocephin 3/22 >   Subjective: Patient is feeling better today.  Denies any dizziness.  Denies any shortness of breath.  Objective: Vitals:   02/09/18 2017 02/09/18 2100 02/10/18 0700 02/10/18 1358  BP:  133/64 (!) 108/56 (!) 122/50  Pulse:  70 73 70  Resp:  15 16 18   Temp:  98.5 F (36.9 C) (!) 97.4 F (36.3 C) 98.3 F (36.8 C)  TempSrc:  Oral Oral Oral  SpO2: (!) 86% 97% 97% 96%  Weight:      Height:        Intake/Output Summary (Last 24 hours) at 02/10/2018 1804 Last data filed at 02/10/2018 1500 Gross per 24 hour  Intake 2001.66 ml  Output 500 ml  Net 1501.66 ml   Filed Weights   02/09/18 1006 02/09/18 1525  Weight: 73.9 kg (163 lb) 76.2 kg (167 lb 15.9 oz)    Examination:  General exam: Appears calm and comfortable  Respiratory system: Clear to auscultation. Respiratory effort normal. Cardiovascular system: S1 & S2 heard, RRR. No JVD, murmurs, rubs, gallops or clicks. No pedal edema. Gastrointestinal system: Abdomen is nondistended, soft and nontender. No organomegaly or masses felt. Normal bowel sounds heard. Central nervous system: Alert and oriented. No focal neurological deficits. Extremities: Symmetric 5 x 5 power. Skin: No rashes, lesions or ulcers Psychiatry: Judgement and insight appear normal. Mood & affect appropriate.     Data Reviewed: I have personally reviewed following labs and imaging studies  CBC: Recent Labs  Lab 02/09/18 1219 02/10/18 0625  WBC 6.3 5.6  NEUTROABS 4.7  --  HGB 9.6* 9.3*  HCT 29.3* 28.8*  MCV 88.0 89.4  PLT 209 161   Basic Metabolic Panel: Recent Labs  Lab 02/09/18 1219 02/10/18 0625  NA 139 137  K 4.3 3.8  CL 108 108  CO2 22 22  GLUCOSE 143* 67  BUN 38* 35*  CREATININE 2.43* 2.32*  CALCIUM 8.8* 8.5*   GFR: Estimated Creatinine Clearance: 15.4 mL/min (A) (by C-G formula based on SCr of 2.32 mg/dL (H)). Liver Function Tests: Recent Labs  Lab 02/09/18 1219  AST 16  ALT 11*   ALKPHOS 55  BILITOT 0.5  PROT 6.0*  ALBUMIN 3.1*   Recent Labs  Lab 02/09/18 1219  LIPASE 22   No results for input(s): AMMONIA in the last 168 hours. Coagulation Profile: No results for input(s): INR, PROTIME in the last 168 hours. Cardiac Enzymes: Recent Labs  Lab 02/09/18 1219  TROPONINI <0.03   BNP (last 3 results) No results for input(s): PROBNP in the last 8760 hours. HbA1C: No results for input(s): HGBA1C in the last 72 hours. CBG: Recent Labs  Lab 02/09/18 2250 02/10/18 0853 02/10/18 1210 02/10/18 1658  GLUCAP 136* 55* 100* 54*   Lipid Profile: No results for input(s): CHOL, HDL, LDLCALC, TRIG, CHOLHDL, LDLDIRECT in the last 72 hours. Thyroid Function Tests: No results for input(s): TSH, T4TOTAL, FREET4, T3FREE, THYROIDAB in the last 72 hours. Anemia Panel: No results for input(s): VITAMINB12, FOLATE, FERRITIN, TIBC, IRON, RETICCTPCT in the last 72 hours. Sepsis Labs: No results for input(s): PROCALCITON, LATICACIDVEN in the last 168 hours.  Recent Results (from the past 240 hour(s))  MRSA PCR Screening     Status: None   Collection Time: 02/09/18  6:00 PM  Result Value Ref Range Status   MRSA by PCR NEGATIVE NEGATIVE Final    Comment:        The GeneXpert MRSA Assay (FDA approved for NASAL specimens only), is one component of a comprehensive MRSA colonization surveillance program. It is not intended to diagnose MRSA infection nor to guide or monitor treatment for MRSA infections. Performed at [facility], [address] [place], [place] [address]          Radiology Studies: Dg Abd Acute W/chest  Result Date: 02/09/2018 CLINICAL DATA:  Nausea. No bowel movement. History of breast cancer. EXAM: DG ABDOMEN ACUTE W/ 1V CHEST COMPARISON:  No recent prior. FINDINGS: Cardiomegaly with normal pulmonary vascularity. No focal infiltrate. No pleural effusion or pneumothorax. Vascular calcifications noted. Surgical clips noted over the abdomen. No  bowel distention or free air. Tiny sclerotic density noted over the right femoral head again noted consistent small bone island. No acute bony abnormality. IMPRESSION: 1. Cardiomegaly with normal pulmonary vascularity. No focal infiltrate. No pleural effusion or pneumothorax. 2.  No acute intra-abdominal abnormality. Electronically Signed   By: [place]   On: 02/09/2018 13:17        Scheduled Meds: . amLODipine  10 mg Oral Daily  . aspirin  81 mg Oral Daily  . ezetimibe  10 mg Oral Daily  . heparin  5,000 Units Subcutaneous Q8H  . insulin aspart  0-5 Units Subcutaneous QHS  . insulin aspart  0-9 Units Subcutaneous TID WC  . pantoprazole  40 mg Oral Daily  . polyethylene glycol  17 g Oral Daily  . timolol  1 drop Both Eyes BID   Continuous Infusions: . sodium chloride 100 mL/hr at 02/09/18 2335  . cefTRIAXone (ROCEPHIN)  IV Stopped (02/10/18 1442)  LOS: 0 days    Time spent: 33mins    Kathie Dike, MD [facility] Pager [phone]  If 7PM-7AM, please contact night-coverage [email or web address] Password [facility] 02/10/2018, 6:04 PM

## 2018-02-10 NOTE — Progress Notes (Signed)
Pt had incontinent urine episode. Unable to get urine sample.

## 2018-02-11 ENCOUNTER — Observation Stay (HOSPITAL_COMMUNITY): Payer: PPO

## 2018-02-11 DIAGNOSIS — N390 Urinary tract infection, site not specified: Secondary | ICD-10-CM | POA: Diagnosis not present

## 2018-02-11 DIAGNOSIS — K219 Gastro-esophageal reflux disease without esophagitis: Secondary | ICD-10-CM | POA: Diagnosis not present

## 2018-02-11 DIAGNOSIS — K5900 Constipation, unspecified: Secondary | ICD-10-CM | POA: Diagnosis not present

## 2018-02-11 DIAGNOSIS — I10 Essential (primary) hypertension: Secondary | ICD-10-CM | POA: Diagnosis not present

## 2018-02-11 DIAGNOSIS — N179 Acute kidney failure, unspecified: Secondary | ICD-10-CM | POA: Diagnosis not present

## 2018-02-11 DIAGNOSIS — R319 Hematuria, unspecified: Secondary | ICD-10-CM | POA: Diagnosis not present

## 2018-02-11 DIAGNOSIS — E1169 Type 2 diabetes mellitus with other specified complication: Secondary | ICD-10-CM | POA: Diagnosis not present

## 2018-02-11 DIAGNOSIS — E785 Hyperlipidemia, unspecified: Secondary | ICD-10-CM | POA: Diagnosis not present

## 2018-02-11 LAB — BASIC METABOLIC PANEL
Anion gap: 8 (ref 5–15)
BUN: 32 mg/dL — AB (ref 6–20)
CALCIUM: 8.2 mg/dL — AB (ref 8.9–10.3)
CO2: 20 mmol/L — ABNORMAL LOW (ref 22–32)
CREATININE: 2 mg/dL — AB (ref 0.44–1.00)
Chloride: 108 mmol/L (ref 101–111)
GFR calc Af Amer: 24 mL/min — ABNORMAL LOW (ref >60)
GFR calc non Af Amer: 21 mL/min — ABNORMAL LOW (ref >60)
GLUCOSE: 98 mg/dL (ref 65–99)
Potassium: 3.8 mmol/L (ref 3.5–5.1)
Sodium: 136 mmol/L (ref 135–145)

## 2018-02-11 LAB — GLUCOSE, CAPILLARY
GLUCOSE-CAPILLARY: 83 mg/dL (ref 65–99)
Glucose-Capillary: 120 mg/dL — ABNORMAL HIGH (ref 65–99)

## 2018-02-11 NOTE — Discharge Summary (Signed)
Physician Discharge Summary  Nancy Medina IRW:[id number] DOB: March 06, 1925 DOA: 02/09/2018  PCP: Lemmie Evens, MD  Admit date: 02/09/2018 Discharge date: 02/11/2018  Admitted From: Assisted living Disposition: Assisted living facility  Recommendations for Outpatient Follow-up:  1. Follow up with PCP in 1-2 weeks 2. Please obtain BMP/CBC in one week 3. Losartan and hydrochlorothiazide discontinued due to renal failure 4. Amaryl discontinued due to hypoglycemia.  Continue to follow blood sugars  Home Health: Home health PT Equipment/Devices:   Discharge Condition: Stable CODE STATUS: DNR Diet recommendation: Heart Healthy / Carb Modified   Brief/Interim Summary: 84 year old female with a history of hypertension and diabetes, presents to the hospital with complaints of nausea and generalized weakness.  She was found to be dehydrated with possible urinary tract infection.  She was started on intravenous antibiotics and IV fluids.  Clinically, she improved.  She no longer had any further nausea and was not dizzy on standing.  Creatinine was elevated on admission, but baseline creatinine is unknown.  She was started on IV fluid with modest improvement of creatinine.  She likely has some element of chronic kidney disease.  Renal ultrasound did not show any evidence of hydronephrosis.  Her losartan and hydrochlorothiazide have been discontinued due to renal failure.  Would avoid any other nephrotoxic agents.  Recommend rechecking creatinine in 1 week to ensure stability.  She was also noted to have episodes of hypoglycemia.  Amaryl has been discontinued and blood sugar should be continue to monitor.  She received 3 doses of intravenous ceftriaxone.  Urine culture is currently pending at the time of discharge.  She is not having any symptoms at this time.  She was seen by physical therapy recommended home health physical therapy.  She is otherwise stable for discharge today.  Discharge Diagnoses:   Active Problems:   Type 2 diabetes mellitus with hyperlipidemia (HCC)   Essential hypertension   GERD   AKI (acute kidney injury) ([place])   Acute lower UTI   Nausea   Constipation    Discharge Instructions  Discharge Instructions    Diet - low sodium heart healthy   Complete by:  As directed    Face-to-face encounter (required for Medicare/Medicaid patients)   Complete by:  As directed    I Kathie Dike certify that this patient is under my care and that I, or a nurse practitioner or physician's assistant working with me, had a face-to-face encounter that meets the physician face-to-face encounter requirements with this patient on 02/11/2018. The encounter with the patient was in whole, or in part for the following medical condition(s) which is the primary reason for home health care (List medical condition): generalized weakness   The encounter with the patient was in whole, or in part, for the following medical condition, which is the primary reason for home health care:  generalized weakness   I certify that, based on my findings, the following services are medically necessary home health services:  Nursing   Reason for Medically Necessary Home Health Services:  Skilled Nursing- Skilled Assessment/Observation   My clinical findings support the need for the above services:  Unsafe ambulation due to balance issues   Further, I certify that my clinical findings support that this patient is homebound due to:  Unable to leave home safely without assistance   Home Health   Complete by:  As directed    To provide the following care/treatments:  PT   Increase activity slowly   Complete by:  As directed  Allergies as of 02/11/2018      Reactions   Metformin    REACTION: Felt poor      Medication List    STOP taking these medications   glimepiride 4 MG tablet Commonly known as:  AMARYL   hydrochlorothiazide 25 MG tablet Commonly known as:  HYDRODIURIL   KLOR-CON 10 10 MEQ  tablet Generic drug:  potassium chloride   losartan 100 MG tablet Commonly known as:  COZAAR   oseltamivir 75 MG capsule Commonly known as:  TAMIFLU     TAKE these medications   amLODipine 10 MG tablet Commonly known as:  NORVASC Take 10 mg by mouth daily.   aspirin 81 MG tablet Take 81 mg by mouth daily.   clonazePAM 1 MG tablet Commonly known as:  KLONOPIN Take 1 mg by mouth 2 (two) times daily as needed.   ezetimibe 10 MG tablet Commonly known as:  ZETIA Take 1 tablet by mouth daily.   fish oil-omega-3 fatty acids 1000 MG capsule Take 2 g by mouth daily.   latanoprost 0.005 % ophthalmic solution Commonly known as:  XALATAN   loperamide 2 MG tablet Commonly known as:  IMODIUM A-D Take 2 mg by mouth 4 (four) times daily as needed for diarrhea or loose stools.   meclizine 25 MG tablet Commonly known as:  ANTIVERT Take 25 mg by mouth 3 (three) times daily as needed.   omeprazole 40 MG capsule Commonly known as:  PRILOSEC Take 1 capsule by mouth daily.   timolol 0.5 % ophthalmic solution Commonly known as:  TIMOPTIC Place 1 drop into both eyes 2 (two) times daily.   VITAMIN B-12 PO Take 1,500 mg by mouth.   cholecalciferol 1000 units tablet Commonly known as:  VITAMIN D Take 2,000 Units by mouth daily.   VITAMIN D-1000 MAX ST 1000 units tablet Generic drug:  Cholecalciferol Take 5,000 Units by mouth daily.       Allergies  Allergen Reactions  . Metformin     REACTION: Felt poor    Consultations:     Procedures/Studies: US Renal  Result Date: 02/11/2018 CLINICAL DATA:  Acute kidney injury, history stage III chronic kidney disease, type II diabetes mellitus, breast cancer, essential hypertension EXAM: RENAL / URINARY TRACT ULTRASOUND COMPLETE COMPARISON:  None FINDINGS: Right Kidney: Length: 9.4 cm. Cortical thinning. Upper normal cortical echogenicity. No mass, hydronephrosis, or shadowing calcification Left Kidney: Length: 9.0 cm. Cortical  thinning. Upper normal cortical echogenicity. No mass, hydronephrosis or shadowing calcification. Bladder: Appears normal for degree of bladder distention. IMPRESSION: Age-related renal cortical atrophy bilaterally without evidence of mass or hydronephrosis. Electronically Signed   By: Lavonia Dana M.D.   On: 02/11/2018 14:16   Dg Abd Acute W/chest  Result Date: 02/09/2018 CLINICAL DATA:  Nausea. No bowel movement. History of breast cancer. EXAM: DG ABDOMEN ACUTE W/ 1V CHEST COMPARISON:  No recent prior. FINDINGS: Cardiomegaly with normal pulmonary vascularity. No focal infiltrate. No pleural effusion or pneumothorax. Vascular calcifications noted. Surgical clips noted over the abdomen. No bowel distention or free air. Tiny sclerotic density noted over the right femoral head again noted consistent small bone island. No acute bony abnormality. IMPRESSION: 1. Cardiomegaly with normal pulmonary vascularity. No focal infiltrate. No pleural effusion or pneumothorax. 2.  No acute intra-abdominal abnormality. Electronically Signed   By: Addis   On: 02/09/2018 13:17     Subjective: Feeling better today.  No nausea or vomiting.  No dizziness on standing.  Discharge Exam: Vitals:  02/10/18 2100 02/11/18 0500  BP: (!) 90/59 (!) 124/52  Pulse: 66 61  Resp: 17 16  Temp: 97.6 F (36.4 C) 97.6 F (36.4 C)  SpO2: 92% 95%   Vitals:   02/10/18 1358 02/10/18 2045 02/10/18 2100 02/11/18 0500  BP: (!) 122/50  (!) 90/59 (!) 124/52  Pulse: 70  66 61  Resp: 18  17 16   Temp: 98.3 F (36.8 C)  97.6 F (36.4 C) 97.6 F (36.4 C)  TempSrc: Oral  Oral Oral  SpO2: 96% 92% 92% 95%  Weight:      Height:        General: Pt is alert, awake, not in acute distress Cardiovascular: RRR, S1/S2 +, no rubs, no gallops Respiratory: CTA bilaterally, no wheezing, no rhonchi Abdominal: Soft, NT, ND, bowel sounds + Extremities: no edema, no cyanosis    The results of significant diagnostics from this  hospitalization (including imaging, microbiology, ancillary and laboratory) are listed below for reference.     Microbiology: Recent Results (from the past 240 hour(s))  MRSA PCR Screening     Status: None   Collection Time: 02/09/18  6:00 PM  Result Value Ref Range Status   MRSA by PCR NEGATIVE NEGATIVE Final    Comment:        The GeneXpert MRSA Assay (FDA approved for NASAL specimens only), is one component of a comprehensive MRSA colonization surveillance program. It is not intended to diagnose MRSA infection nor to guide or monitor treatment for MRSA infections. Performed at [facility], [address] [place], [place] [address]      Labs: BNP (last 3 results) No results for input(s): BNP in the last 8760 hours. Basic Metabolic Panel: Recent Labs  Lab 02/09/18 1219 02/10/18 0625 02/11/18 0917  NA 139 137 136  K 4.3 3.8 3.8  CL 108 108 108  CO2 22 22 20*  GLUCOSE 143* 67 98  BUN 38* 35* 32*  CREATININE 2.43* 2.32* 2.00*  CALCIUM 8.8* 8.5* 8.2*   Liver Function Tests: Recent Labs  Lab 02/09/18 1219  AST 16  ALT 11*  ALKPHOS 55  BILITOT 0.5  PROT 6.0*  ALBUMIN 3.1*   Recent Labs  Lab 02/09/18 1219  LIPASE 22   No results for input(s): AMMONIA in the last 168 hours. CBC: Recent Labs  Lab 02/09/18 1219 02/10/18 0625  WBC 6.3 5.6  NEUTROABS 4.7  --   HGB 9.6* 9.3*  HCT 29.3* 28.8*  MCV 88.0 89.4  PLT 209 215   Cardiac Enzymes: Recent Labs  Lab 02/09/18 1219  TROPONINI <0.03   BNP: Invalid input(s): POCBNP CBG: Recent Labs  Lab 02/10/18 1658 02/10/18 2223 02/10/18 2316 02/11/18 0811 02/11/18 1146  GLUCAP 54* 44* 81 83 120*   D-Dimer No results for input(s): DDIMER in the last 72 hours. Hgb A1c No results for input(s): HGBA1C in the last 72 hours. Lipid Profile No results for input(s): CHOL, HDL, LDLCALC, TRIG, CHOLHDL, LDLDIRECT in the last 72 hours. Thyroid function studies No results for input(s): TSH, T4TOTAL, T3FREE,  THYROIDAB in the last 72 hours.  Invalid input(s): FREET3 Anemia work up No results for input(s): VITAMINB12, FOLATE, FERRITIN, TIBC, IRON, RETICCTPCT in the last 72 hours. Urinalysis    Component Value Date/Time   COLORURINE YELLOW 02/09/2018 1200   APPEARANCEUR TURBID (A) 02/09/2018 1200   LABSPEC 1.015 02/09/2018 1200   PHURINE 5.0 02/09/2018 1200   GLUCOSEU NEGATIVE 02/09/2018 1200   HGBUR MODERATE (A) 02/09/2018 1200   HGBUR  negative 06/04/2008 0811   BILIRUBINUR NEGATIVE 02/09/2018 1200   KETONESUR NEGATIVE 02/09/2018 1200   PROTEINUR NEGATIVE 02/09/2018 1200   UROBILINOGEN 0.2 06/04/2008 0811   NITRITE NEGATIVE 02/09/2018 1200   LEUKOCYTESUR LARGE (A) 02/09/2018 1200   Sepsis Labs Invalid input(s): PROCALCITONIN,  WBC,  LACTICIDVEN Microbiology Recent Results (from the past 240 hour(s))  MRSA PCR Screening     Status: None   Collection Time: 02/09/18  6:00 PM  Result Value Ref Range Status   MRSA by PCR NEGATIVE NEGATIVE Final    Comment:        The GeneXpert MRSA Assay (FDA approved for NASAL specimens only), is one component of a comprehensive MRSA colonization surveillance program. It is not intended to diagnose MRSA infection nor to guide or monitor treatment for MRSA infections. Performed at [facility], [address], [place], [place] [address]      Time coordinating discharge: Over 30 minutes  SIGNED:   Kathie Dike, MD  [facility] 02/11/2018, 2:49 PM Pager   If 7PM-7AM, please contact night-coverage [email or web address] Password [email or web address]

## 2018-02-11 NOTE — Plan of Care (Signed)
  Problem: Acute Rehab PT Goals(only PT should resolve) Goal: Patient Will Transfer Sit To/From Stand Outcome: Progressing Flowsheets (Taken 02/11/2018 1129) Patient will transfer sit to/from stand: with modified independence   Problem: Acute Rehab PT Goals(only PT should resolve) Goal: Pt Will Transfer Bed To Chair/Chair To Bed Outcome: Progressing Flowsheets (Taken 02/11/2018 1129) Pt will Transfer Bed to Chair/Chair to Bed: with modified independence   Problem: Acute Rehab PT Goals(only PT should resolve) Goal: Pt Will Ambulate Outcome: Progressing Flowsheets (Taken 02/11/2018 1129) Pt will Ambulate: 100 feet;with modified independence;with rolling walker   Problem: Acute Rehab PT Goals(only PT should resolve) Goal: Pt will Roll Supine to Side Outcome: Progressing Flowsheets (Taken 02/11/2018 1129) Pt will Roll Supine to Side: Independently   Problem: Acute Rehab PT Goals(only PT should resolve) Goal: Pt Will Go Supine/Side To Sit Outcome: Progressing Flowsheets (Taken 02/11/2018 1129) Pt will go Supine/Side to Sit: Independently   Problem: Acute Rehab PT Goals(only PT should resolve) Goal: Patient Will Perform Sitting Balance Outcome: Progressing Flowsheets (Taken 02/11/2018 1129) Patient will perform sitting balance: Independently   Clarene Critchley PT, DPT 11:31 AM, 02/11/18 [phone]

## 2018-02-11 NOTE — NC FL2 (Addendum)
[place] LEVEL OF CARE SCREENING TOOL     IDENTIFICATION  Patient Name: Nancy Medina Birthdate: 1925/10/29 Sex: female Admission Date (Current Location): 02/09/2018  [facility] and [place] Number:  [employer] and Address:  [place] [address], [place]      Provider Number: [id number]  Attending Physician Name and Address:  Kathie Dike, MD  Relative Name and Phone Number:  Mickie Kay [phone]    Current Level of Care: Hospital Recommended Level of Care: Other (Comment)([facility]) Prior Approval Number:    Date Approved/Denied:   PASRR Number:    Discharge Plan: Other (Comment)([facility])    Current Diagnoses: Patient Active Problem List   Diagnosis Date Noted  . AKI (acute kidney injury) ([place]) 02/09/2018  . Acute lower UTI 02/09/2018  . Nausea 02/09/2018  . Constipation 02/09/2018  . Diarrhea 11/25/2011  . Lymphocytic colitis 06/15/2011  . Esophageal dysphagia 06/15/2011  . UNSPECIFIED ANEMIA 04/27/2009  . DIASTOLIC DYSFUNCTION 82/95/6213  . ACHILLES TENDINITIS 10/27/2008  . PERIPHERAL EDEMA 10/14/2008  . ALLERGIC RHINITIS, SEASONAL 03/26/2008  . RENAL DISEASE, CHRONIC, STAGE III 03/26/2008  . DEGENERATIVE DISC DISEASE 01/15/2008  . Type 2 diabetes mellitus with hyperlipidemia ([place]) 12/13/2007  . HYPERLIPIDEMIA 10/11/2006  . OBESITY NOS 10/11/2006  . ANXIETY 10/11/2006  . DEPRESSION 10/11/2006  . GLAUCOMA NOS 10/11/2006  . BENIGN POSITIONAL VERTIGO 10/11/2006  . Essential hypertension 10/11/2006  . GERD 10/11/2006  . OVERACTIVE BLADDER 10/11/2006  . OSTEOPOROSIS 10/11/2006  . BREAST CANCER, HX OF 10/11/2006  . CEREBROVASCULAR ACCIDENT, HX OF 10/11/2006    Orientation RESPIRATION BLADDER Height & Weight     Self, Time, Situation, Place  Normal Continent Weight: 167 lb 15.9 oz (76.2 kg) Height:  5\' 2"  (157.5 cm)  BEHAVIORAL SYMPTOMS/MOOD NEUROLOGICAL  BOWEL NUTRITION STATUS  (NA) (NA) Continent Diet(Heart Healthy)  AMBULATORY STATUS COMMUNICATION OF NEEDS Skin   Limited Assist(will need walker) Verbally Normal                       Personal Care Assistance Level of Assistance  Bathing, Dressing Bathing Assistance: Limited assistance   Dressing Assistance: Limited assistance     Functional Limitations Info  Hearing   Hearing Info: Impaired      SPECIAL CARE FACTORS FREQUENCY  (NA)                    Contractures Contractures Info: Not present    Additional Factors Info  Code Status, Allergies Code Status Info: DNR Allergies Info: Metformin           Current Medications (02/11/2018):  This is the current hospital active medication list amLODipine 10 MG tablet Commonly known as:  NORVASC Take 10 mg by mouth daily.   aspirin 81 MG tablet Take 81 mg by mouth daily.   clonazePAM 1 MG tablet Commonly known as:  KLONOPIN Take 1 mg by mouth 2 (two) times daily as needed.   ezetimibe 10 MG tablet Commonly known as:  ZETIA Take 1 tablet by mouth daily.   fish oil-omega-3 fatty acids 1000 MG capsule Take 2 g by mouth daily.   latanoprost 0.005 % ophthalmic solution Commonly known as:  XALATAN   loperamide 2 MG tablet Commonly known as:  IMODIUM A-D Take 2 mg by mouth 4 (four) times daily as needed for diarrhea or loose stools.   meclizine 25 MG tablet Commonly known as:  ANTIVERT  Take 25 mg by mouth 3 (three) times daily as needed.   omeprazole 40 MG capsule Commonly known as:  PRILOSEC Take 1 capsule by mouth daily.   timolol 0.5 % ophthalmic solution Commonly known as:  TIMOPTIC Place 1 drop into both eyes 2 (two) times daily.   VITAMIN B-12 PO Take 1,500 mg by mouth.   cholecalciferol 1000 units tablet Commonly known as:  VITAMIN D Take 2,000 Units by mouth daily.   VITAMIN D-1000 MAX ST 1000 units tablet Generic drug:  Cholecalciferol Take 5,000 Units by mouth daily.      Discharge Medications: Please see discharge summary for a list of discharge medications.  Relevant Imaging Results:  Relevant Lab Results:   Additional Information SS# [id number]  Carolin Sicks, [place]

## 2018-02-11 NOTE — Clinical Social Work Note (Signed)
Clinical Social Work Assessment  Patient Details  Name: Nancy Medina MRN: [id number] Date of Birth: May 23, 1925  Date of referral:  02/11/18               Reason for consult:  Discharge Planning                Permission sought to share information with:  Family Supports, Customer service manager Permission granted to share information::  Yes, Verbal Permission Granted  Name::     Mickie Kay  Agency::     Relationship::  Daughter  Contact Information:  yes  Housing/Transportation Living arrangements for the past 2 months:  [place] of Information:  Patient Patient Interpreter Needed:  None Criminal Activity/Legal Involvement Pertinent to Current Situation/Hospitalization:  No - Comment as needed Significant Relationships:  Adult Children Lives with:  Self, Other (Comment)([facility]) Do you feel safe going back to the place where you live?  Yes Need for family participation in patient care:  Yes (Comment)(Daughter Mickie Kay)  Care giving concerns:  CSW received a consult regarding patient returning back to [facility].  Patient resides at [facility].  Patient's daughter would like for patient to reside at [facility].   Social Worker assessment / plan:  CSW spoke with patient and patient's daughter regarding returning to [place].  Patient and patient's daughter are agreeable for disposition planning.  Employment status:  Retired Nurse, adult PT Recommendations:  [place] / Referral to community resources:  (NA)  Patient/Family's Response to care:  Patient and patient's daughter reports agreement with discharge plan.  Patient/Family's Understanding of and Emotional Response to Diagnosis, Current Treatment, and Prognosis: Patient/family are realistic regarding therapy needs.  Patient and patient's daughter expressed understanding of CSW role and  discharge process as well as medical condition.  No questions/concerns about plan or treatment.  Emotional Assessment Appearance:  Appears stated age Attitude/Demeanor/Rapport:  Gracious Affect (typically observed):  Accepting, Appropriate, Calm Orientation:  Oriented to Self, Oriented to Place, Oriented to  Time, Oriented to Situation Alcohol / Substance use:  Not Applicable Psych involvement (Current and /or in the community):  No (Comment)  Discharge Needs  Concerns to be addressed:  Care Coordination Readmission within the last 30 days:  No Current discharge risk:  None Barriers to Discharge:  Continued Medical Work up   [employer], [place] 02/11/2018, 2:19 PM

## 2018-02-11 NOTE — Progress Notes (Signed)
Pt IV removed, WNL. D/C instructions given to pt. Verbalized understanding. Report called and given to staff at [facility]. Daughter at bedside to transport to facility.

## 2018-02-11 NOTE — Evaluation (Signed)
Physical Therapy Evaluation Patient Details Name: Nancy Medina MRN: [id number] DOB: 07-29-1925 Today's Date: 02/11/2018   History of Present Illness  Nancy Medina is a 84 y.o. female with medical history significant of hypertension, diabetes, reported chronic kidney disease stage III (baseline creatinine is unknown), presents to the hospital with complaints of nausea.  She reports that for the past 3-4 days she been having dry heaves without any frank vomiting.  She has been somewhat constipated and required a suppository 3 days ago.  P.o. intake has been somewhat poor.  She is not having shortness of breath, cough, chest pain, dysuria.  She has not had any fevers.    Clinical Impression  Patient was found awake and alert in bed with daughter in room upon entering. Patient performed bed mobility with modified independence requiring increased time. Patient performed transfers sit to stand and stand to sit with minimal guarding and use of a rolling walker. Patient ambulated 50 feet using rolling walker with minimal guarding and use of gait belt. Patient would benefit from continued skilled physical therapy to improve patient's overall strength, balance, ambulation and overall functional mobility.     Follow Up Recommendations Home health PT;Other (comment)(Return to assisted living)    Equipment Recommendations  Rolling walker with 5" wheels    Recommendations for Other Services       Precautions / Restrictions Precautions Precautions: Fall Restrictions Weight Bearing Restrictions: No      Mobility  Bed Mobility Overal bed mobility: Modified Independent             General bed mobility comments: Patient was able to perform rolling and supine to sitting at edge of bed with increased time  Transfers Overall transfer level: Needs assistance Equipment used: Rolling walker (2 wheeled) Transfers: Sit to/from Stand Sit to Stand: Min guard         General transfer comment:  Patient performed sit to and from standing with minimal guarding, use of gait belt and rolling walker  Ambulation/Gait Ambulation/Gait assistance: Min guard Ambulation Distance (Feet): 50 Feet Assistive device: Rolling walker (2 wheeled) Gait Pattern/deviations: Decreased stride length;Decreased stance time - right;Decreased stance time - left;Antalgic   Gait velocity interpretation: Below normal speed for age/gender General Gait Details: Patient ambulated with decreased velocity and antalgic gait which she reported is due to her fall   Stairs            Wheelchair Mobility    Modified Rankin (Stroke Patients Only)       Balance Overall balance assessment: Needs assistance Sitting-balance support: Single extremity supported Sitting balance-Leahy Scale: Good Sitting balance - Comments: Patient sat at edge of bed without assistance and use of single upper extremity for support   Standing balance support: Bilateral upper extremity supported Standing balance-Leahy Scale: Fair Standing balance comment: Patient reached for assistance upon first standing, with rolling walker appeared balanced in standing without assistance                             Pertinent Vitals/Pain Pain Assessment: 0-10 Pain Score: 5  Pain Location: Bilateral lower extremities Pain Descriptors / Indicators: Sharp Pain Intervention(s): Limited activity within patient's tolerance;Monitored during session    [place] expects to be discharged to:: Assisted living               Home Equipment: Kasandra Knudsen - single point      Prior Function Level of Independence: Independent with  assistive device(s)               Hand Dominance        Extremity/Trunk Assessment   Upper Extremity Assessment Upper Extremity Assessment: Overall WFL for tasks assessed    Lower Extremity Assessment Lower Extremity Assessment: Generalized weakness    Cervical / Trunk  Assessment Cervical / Trunk Assessment: Normal  Communication   Communication: No difficulties;HOH  Cognition Arousal/Alertness: Awake/alert Behavior During Therapy: WFL for tasks assessed/performed Overall Cognitive Status: Within Functional Limits for tasks assessed                                        General Comments      Exercises     Assessment/Plan    PT Assessment Patient needs continued PT services  PT Problem List Decreased strength;Decreased activity tolerance;Decreased balance;Decreased mobility;Pain       PT Treatment Interventions DME instruction;Balance training;Gait training;Neuromuscular re-education;Functional mobility training;Therapeutic activities;Therapeutic exercise;Patient/family education    PT Goals (Current goals can be found in the Care Plan section)  Acute Rehab PT Goals Patient Stated Goal: Return to assisted living facility PT Goal Formulation: With patient/family Time For Goal Achievement: 02/18/18 Potential to Achieve Goals: Good    Frequency Min 3X/week   Barriers to discharge        Co-evaluation               AM-PAC PT "6 Clicks" Daily Activity  Outcome Measure Difficulty turning over in bed (including adjusting bedclothes, sheets and blankets)?: A Little Difficulty moving from lying on back to sitting on the side of the bed? : A Little Difficulty sitting down on and standing up from a chair with arms (e.g., wheelchair, bedside commode, etc,.)?: A Little Help needed moving to and from a bed to chair (including a wheelchair)?: A Little Help needed walking in hospital room?: A Little Help needed climbing 3-5 steps with a railing? : A Lot 6 Click Score: 17    End of Session Equipment Utilized During Treatment: Gait belt Activity Tolerance: Patient tolerated treatment well Patient left: in bed Nurse Communication: Mobility status PT Visit Diagnosis: Unsteadiness on feet (R26.81);Other abnormalities of gait  and mobility (R26.89);Muscle weakness (generalized) (M62.81)    Time: 1012-1029 PT Time Calculation (min) (ACUTE ONLY): 17 min   Charges:   PT Evaluation $PT Eval Low Complexity: 1 Low     PT G Codes:        Clarene Critchley PT, DPT 11:27 AM, 02/11/18 [phone]

## 2018-02-11 NOTE — Progress Notes (Signed)
Patient will Discharge To: [facility] Anticipated DC Date:02/11/18 Family Notified: Maryella Shivers [phone] Transport By: Daughter will transport   Per MD patient ready for DC to [place], patient, patient's family, and facility notified of DC. Assessment, Fl2/Pasrr, and Discharge Summary sent to facility. RN given number for report [phone]). DC packet on chart. Ambulance transport requested for patient.   CSW signing off.  Reed Breech LCSWA [phone]

## 2018-02-11 NOTE — NC FL2 (Deleted)
[place] LEVEL OF CARE SCREENING TOOL     IDENTIFICATION  Patient Name: Nancy Medina Birthdate: [birth year] Sex: female Admission Date (Current Location): 02/09/2018  [facility] and [place] Number:  [employer] and Address:  [facility] [address] [place]      Provider Number: [id number]  Attending Physician Name and Address:  Kathie Dike, MD  Relative Name and Phone Number:  Mickie Kay [phone]    Current Level of Care: Hospital Recommended Level of Care: Other (Comment)([facility]) Prior Approval Number:    Date Approved/Denied:   PASRR Number:    Discharge Plan: Other (Comment)([facility])    Current Diagnoses: Patient Active Problem List   Diagnosis Date Noted  . AKI (acute kidney injury) ([place]) 02/09/2018  . Acute lower UTI 02/09/2018  . Nausea 02/09/2018  . Constipation 02/09/2018  . Diarrhea 11/25/2011  . Lymphocytic colitis 06/15/2011  . Esophageal dysphagia 06/15/2011  . UNSPECIFIED ANEMIA 04/27/2009  . DIASTOLIC DYSFUNCTION 16/60/6301  . ACHILLES TENDINITIS 10/27/2008  . PERIPHERAL EDEMA 10/14/2008  . ALLERGIC RHINITIS, SEASONAL 03/26/2008  . RENAL DISEASE, CHRONIC, STAGE III 03/26/2008  . DEGENERATIVE DISC DISEASE 01/15/2008  . Type 2 diabetes mellitus with hyperlipidemia ([place]) 12/13/2007  . HYPERLIPIDEMIA 10/11/2006  . OBESITY NOS 10/11/2006  . ANXIETY 10/11/2006  . DEPRESSION 10/11/2006  . GLAUCOMA NOS 10/11/2006  . BENIGN POSITIONAL VERTIGO 10/11/2006  . Essential hypertension 10/11/2006  . GERD 10/11/2006  . OVERACTIVE BLADDER 10/11/2006  . OSTEOPOROSIS 10/11/2006  . BREAST CANCER, HX OF 10/11/2006  . CEREBROVASCULAR ACCIDENT, HX OF 10/11/2006    Orientation RESPIRATION BLADDER Height & Weight     Self, Time, Situation, Place  Normal Continent Weight: 167 lb 15.9 oz (76.2 kg) Height:  5\' 2"  (157.5 cm)  BEHAVIORAL SYMPTOMS/MOOD NEUROLOGICAL  BOWEL NUTRITION STATUS  (NA) (NA) Continent Diet(Heart Healthy)  AMBULATORY STATUS COMMUNICATION OF NEEDS Skin   Limited Assist(will need walker) Verbally Normal                       Personal Care Assistance Level of Assistance  Bathing, Dressing Bathing Assistance: Limited assistance   Dressing Assistance: Limited assistance     Functional Limitations Info  Hearing   Hearing Info: Impaired      SPECIAL CARE FACTORS FREQUENCY  (NA)                    Contractures Contractures Info: Not present    Additional Factors Info  Code Status, Allergies Code Status Info: DNR Allergies Info: Metformin           Current Medications (02/11/2018):  This is the current hospital active medication list Current Facility-Administered Medications  Medication Dose Route Frequency Provider Last Rate Last Dose  . 0.9 %  sodium chloride infusion   Intravenous Continuous Kathie Dike, MD 100 mL/hr at 02/11/18 0055    . acetaminophen (TYLENOL) tablet 650 mg  650 mg Oral Q6H PRN Kathie Dike, MD   650 mg at 02/10/18 2113   Or  . acetaminophen (TYLENOL) suppository 650 mg  650 mg Rectal Q6H PRN Kathie Dike, MD      . amLODipine (NORVASC) tablet 10 mg  10 mg Oral Daily Kathie Dike, MD   10 mg at 02/11/18 0923  . aspirin chewable tablet 81 mg  81 mg Oral Daily Kathie Dike, MD   81 mg at 02/11/18 0923  . cefTRIAXone (ROCEPHIN) 1 g in  sodium chloride 0.9 % 100 mL IVPB  1 g Intravenous Q24H Kathie Dike, MD   Stopped at 02/10/18 1442  . clonazePAM (KLONOPIN) tablet 1 mg  1 mg Oral BID PRN Kathie Dike, MD   1 mg at 02/10/18 2113  . ezetimibe (ZETIA) tablet 10 mg  10 mg Oral Daily Kathie Dike, MD   10 mg at 02/11/18 0923  . heparin injection 5,000 Units  5,000 Units Subcutaneous Q8H Kathie Dike, MD   5,000 Units at 02/11/18 0512  . insulin aspart (novoLOG) injection 0-9 Units  0-9 Units Subcutaneous TID WC Kathie Dike, MD      . meclizine (ANTIVERT)  tablet 25 mg  25 mg Oral TID PRN Kathie Dike, MD      . ondansetron (ZOFRAN) tablet 4 mg  4 mg Oral Q6H PRN Kathie Dike, MD       Or  . ondansetron (ZOFRAN) injection 4 mg  4 mg Intravenous Q6H PRN Kathie Dike, MD      . pantoprazole (PROTONIX) EC tablet 40 mg  40 mg Oral Daily Kathie Dike, MD   40 mg at 02/11/18 0923  . polyethylene glycol (MIRALAX / GLYCOLAX) packet 17 g  17 g Oral Daily Kathie Dike, MD   17 g at 02/11/18 0923  . timolol (TIMOPTIC) 0.5 % ophthalmic solution 1 drop  1 drop Both Eyes BID Kathie Dike, MD   1 drop at 02/11/18 8127     Discharge Medications: Please see discharge summary for a list of discharge medications.  Relevant Imaging Results:  Relevant Lab Results:   Additional Information SS# [id number]  Carolin Sicks, [place]

## 2018-02-12 LAB — URINE CULTURE
Culture: NO GROWTH
SPECIAL REQUESTS: NORMAL

## 2018-02-16 DIAGNOSIS — R262 Difficulty in walking, not elsewhere classified: Secondary | ICD-10-CM | POA: Diagnosis not present

## 2018-02-16 DIAGNOSIS — E119 Type 2 diabetes mellitus without complications: Secondary | ICD-10-CM | POA: Diagnosis not present

## 2018-02-16 DIAGNOSIS — N390 Urinary tract infection, site not specified: Secondary | ICD-10-CM | POA: Diagnosis not present

## 2018-02-16 DIAGNOSIS — Z9181 History of falling: Secondary | ICD-10-CM | POA: Diagnosis not present

## 2018-02-16 DIAGNOSIS — I10 Essential (primary) hypertension: Secondary | ICD-10-CM | POA: Diagnosis not present

## 2018-02-16 DIAGNOSIS — N179 Acute kidney failure, unspecified: Secondary | ICD-10-CM | POA: Diagnosis not present

## 2018-02-19 DIAGNOSIS — Z6830 Body mass index (BMI) 30.0-30.9, adult: Secondary | ICD-10-CM | POA: Diagnosis not present

## 2018-02-19 DIAGNOSIS — I1310 Hypertensive heart and chronic kidney disease without heart failure, with stage 1 through stage 4 chronic kidney disease, or unspecified chronic kidney disease: Secondary | ICD-10-CM | POA: Diagnosis not present

## 2018-02-19 DIAGNOSIS — E669 Obesity, unspecified: Secondary | ICD-10-CM | POA: Diagnosis not present

## 2018-02-19 DIAGNOSIS — M810 Age-related osteoporosis without current pathological fracture: Secondary | ICD-10-CM | POA: Diagnosis not present

## 2018-02-19 DIAGNOSIS — Z7982 Long term (current) use of aspirin: Secondary | ICD-10-CM | POA: Diagnosis not present

## 2018-02-19 DIAGNOSIS — H409 Unspecified glaucoma: Secondary | ICD-10-CM | POA: Diagnosis not present

## 2018-02-19 DIAGNOSIS — Z853 Personal history of malignant neoplasm of breast: Secondary | ICD-10-CM | POA: Diagnosis not present

## 2018-02-19 DIAGNOSIS — Z87440 Personal history of urinary (tract) infections: Secondary | ICD-10-CM | POA: Diagnosis not present

## 2018-02-19 DIAGNOSIS — E1122 Type 2 diabetes mellitus with diabetic chronic kidney disease: Secondary | ICD-10-CM | POA: Diagnosis not present

## 2018-02-19 DIAGNOSIS — Z9181 History of falling: Secondary | ICD-10-CM | POA: Diagnosis not present

## 2018-02-19 DIAGNOSIS — I5030 Unspecified diastolic (congestive) heart failure: Secondary | ICD-10-CM | POA: Diagnosis not present

## 2018-02-19 DIAGNOSIS — E1169 Type 2 diabetes mellitus with other specified complication: Secondary | ICD-10-CM | POA: Diagnosis not present

## 2018-02-19 DIAGNOSIS — Z8673 Personal history of transient ischemic attack (TIA), and cerebral infarction without residual deficits: Secondary | ICD-10-CM | POA: Diagnosis not present

## 2018-02-19 DIAGNOSIS — E785 Hyperlipidemia, unspecified: Secondary | ICD-10-CM | POA: Diagnosis not present

## 2018-02-19 DIAGNOSIS — N183 Chronic kidney disease, stage 3 (moderate): Secondary | ICD-10-CM | POA: Diagnosis not present

## 2018-02-20 DIAGNOSIS — W19XXXD Unspecified fall, subsequent encounter: Secondary | ICD-10-CM | POA: Diagnosis not present

## 2018-02-20 DIAGNOSIS — E118 Type 2 diabetes mellitus with unspecified complications: Secondary | ICD-10-CM | POA: Diagnosis not present

## 2018-02-20 DIAGNOSIS — N183 Chronic kidney disease, stage 3 (moderate): Secondary | ICD-10-CM | POA: Diagnosis not present

## 2018-02-20 DIAGNOSIS — I10 Essential (primary) hypertension: Secondary | ICD-10-CM | POA: Diagnosis not present

## 2018-03-02 ENCOUNTER — Encounter (HOSPITAL_COMMUNITY): Payer: Self-pay | Admitting: *Deleted

## 2018-03-02 ENCOUNTER — Other Ambulatory Visit: Payer: Self-pay

## 2018-03-02 ENCOUNTER — Emergency Department (HOSPITAL_COMMUNITY): Payer: PPO

## 2018-03-02 ENCOUNTER — Emergency Department (HOSPITAL_COMMUNITY)
Admission: EM | Admit: 2018-03-02 | Discharge: 2018-03-02 | Disposition: A | Discharge status: Home / Self Care | Payer: PPO | Attending: Emergency Medicine | Admitting: Emergency Medicine

## 2018-03-02 DIAGNOSIS — Z79899 Other long term (current) drug therapy: Secondary | ICD-10-CM | POA: Diagnosis not present

## 2018-03-02 DIAGNOSIS — Z87891 Personal history of nicotine dependence: Secondary | ICD-10-CM | POA: Insufficient documentation

## 2018-03-02 DIAGNOSIS — R4182 Altered mental status, unspecified: Secondary | ICD-10-CM | POA: Diagnosis not present

## 2018-03-02 DIAGNOSIS — R0989 Other specified symptoms and signs involving the circulatory and respiratory systems: Secondary | ICD-10-CM | POA: Diagnosis not present

## 2018-03-02 DIAGNOSIS — I129 Hypertensive chronic kidney disease with stage 1 through stage 4 chronic kidney disease, or unspecified chronic kidney disease: Secondary | ICD-10-CM | POA: Diagnosis not present

## 2018-03-02 DIAGNOSIS — R531 Weakness: Secondary | ICD-10-CM | POA: Diagnosis not present

## 2018-03-02 DIAGNOSIS — R4020 Unspecified coma: Secondary | ICD-10-CM | POA: Diagnosis not present

## 2018-03-02 DIAGNOSIS — E860 Dehydration: Secondary | ICD-10-CM | POA: Diagnosis not present

## 2018-03-02 DIAGNOSIS — E1122 Type 2 diabetes mellitus with diabetic chronic kidney disease: Secondary | ICD-10-CM | POA: Insufficient documentation

## 2018-03-02 DIAGNOSIS — I1310 Hypertensive heart and chronic kidney disease without heart failure, with stage 1 through stage 4 chronic kidney disease, or unspecified chronic kidney disease: Secondary | ICD-10-CM | POA: Diagnosis not present

## 2018-03-02 DIAGNOSIS — Z7982 Long term (current) use of aspirin: Secondary | ICD-10-CM | POA: Diagnosis not present

## 2018-03-02 DIAGNOSIS — R404 Transient alteration of awareness: Secondary | ICD-10-CM | POA: Diagnosis not present

## 2018-03-02 DIAGNOSIS — F4489 Other dissociative and conversion disorders: Secondary | ICD-10-CM | POA: Diagnosis not present

## 2018-03-02 DIAGNOSIS — N184 Chronic kidney disease, stage 4 (severe): Secondary | ICD-10-CM | POA: Diagnosis not present

## 2018-03-02 LAB — URINALYSIS, ROUTINE W REFLEX MICROSCOPIC
BILIRUBIN URINE: NEGATIVE
GLUCOSE, UA: NEGATIVE mg/dL
HGB URINE DIPSTICK: NEGATIVE
Ketones, ur: NEGATIVE mg/dL
LEUKOCYTES UA: NEGATIVE
Nitrite: NEGATIVE
Protein, ur: 30 mg/dL — AB
SPECIFIC GRAVITY, URINE: 1.016 (ref 1.005–1.030)
pH: 5 (ref 5.0–8.0)

## 2018-03-02 LAB — CBC WITH DIFFERENTIAL/PLATELET
BASOS ABS: 0 10*3/uL (ref 0.0–0.1)
Basophils Relative: 0 %
EOS PCT: 4 %
Eosinophils Absolute: 0.2 10*3/uL (ref 0.0–0.7)
HEMATOCRIT: 31.1 % — AB (ref 36.0–46.0)
Hemoglobin: 10.1 g/dL — ABNORMAL LOW (ref 12.0–15.0)
LYMPHS ABS: 1.2 10*3/uL (ref 0.7–4.0)
LYMPHS PCT: 20 %
MCH: 28.9 pg (ref 26.0–34.0)
MCHC: 32.5 g/dL (ref 30.0–36.0)
MCV: 88.9 fL (ref 78.0–100.0)
MONO ABS: 0.8 10*3/uL (ref 0.1–1.0)
Monocytes Relative: 13 %
NEUTROS ABS: 4 10*3/uL (ref 1.7–7.7)
Neutrophils Relative %: 63 %
PLATELETS: 227 10*3/uL (ref 150–400)
RBC: 3.5 MIL/uL — ABNORMAL LOW (ref 3.87–5.11)
RDW: 14.3 % (ref 11.5–15.5)
WBC: 6.2 10*3/uL (ref 4.0–10.5)

## 2018-03-02 LAB — COMPREHENSIVE METABOLIC PANEL
ALBUMIN: 3.5 g/dL (ref 3.5–5.0)
ALT: 17 U/L (ref 14–54)
ANION GAP: 13 (ref 5–15)
AST: 18 U/L (ref 15–41)
Alkaline Phosphatase: 75 U/L (ref 38–126)
BILIRUBIN TOTAL: 0.4 mg/dL (ref 0.3–1.2)
BUN: 35 mg/dL — AB (ref 6–20)
CO2: 21 mmol/L — ABNORMAL LOW (ref 22–32)
Calcium: 9.3 mg/dL (ref 8.9–10.3)
Chloride: 106 mmol/L (ref 101–111)
Creatinine, Ser: 1.76 mg/dL — ABNORMAL HIGH (ref 0.44–1.00)
GFR calc Af Amer: 28 mL/min — ABNORMAL LOW (ref >60)
GFR calc non Af Amer: 24 mL/min — ABNORMAL LOW (ref >60)
GLUCOSE: 258 mg/dL — AB (ref 65–99)
POTASSIUM: 4 mmol/L (ref 3.5–5.1)
SODIUM: 140 mmol/L (ref 135–145)
TOTAL PROTEIN: 6.6 g/dL (ref 6.5–8.1)

## 2018-03-02 LAB — CBG MONITORING, ED: GLUCOSE-CAPILLARY: 230 mg/dL — AB (ref 65–99)

## 2018-03-02 LAB — I-STAT CG4 LACTIC ACID, ED: Lactic Acid, Venous: 1.27 mmol/L (ref 0.5–1.9)

## 2018-03-02 NOTE — ED Notes (Addendum)
Pt wheeled to waiting room with family. Pt verbalized understanding of discharge instructions.

## 2018-03-02 NOTE — ED Provider Notes (Signed)
[facility] EMERGENCY DEPARTMENT Provider Note   CSN: [id number] Arrival date & time: 03/02/18  2058     History   Chief Complaint Chief Complaint  Patient presents with  . Altered Mental Status    HPI Nancy Medina is a 84 y.o. female.  Pt presents to the ED today with altered mental status.  She lives at [facility] and daughter and staff thought she was more confused than usual.  Pt admitted from 3/22-3/24 for uti and dehydration (although urine cultures were negative).  Family concerned she has another UTI.  Pt denies any pain.  She said she feels fine.     Past Medical History:  Diagnosis Date  . Achilles tendinitis   . Allergic rhinitis   . Anemia of chronic disease    at least back to 2010  . Anxiety   . Benign paroxysmal positional vertigo   . Cataract   . Cerebrovascular accident ([place])   . Chronic kidney disease (CKD), stage III (moderate) (HCC)   . Depression   . DJD (degenerative joint disease)   . DM (diabetes mellitus) ([place])    type II  . GERD (gastroesophageal reflux disease)   . HTN (hypertension)   . HX: breast cancer   . Hyperlipidemia   . Hypertonicity of bladder   . Knee pain, left   . Leg pain   . Lymphocytic colitis 2008   treated with Lialda short-term  . Obesity   . Osteoporosis, unspecified   . Renal disease    stage III  . Unspecified fall   . Unspecified glaucoma(365.9)     Patient Active Problem List   Diagnosis Date Noted  . AKI (acute kidney injury) ([place]) 02/09/2018  . Acute lower UTI 02/09/2018  . Nausea 02/09/2018  . Constipation 02/09/2018  . Diarrhea 11/25/2011  . Lymphocytic colitis 06/15/2011  . Esophageal dysphagia 06/15/2011  . UNSPECIFIED ANEMIA 04/27/2009  . DIASTOLIC DYSFUNCTION 46/27/0350  . ACHILLES TENDINITIS 10/27/2008  . PERIPHERAL EDEMA 10/14/2008  . ALLERGIC RHINITIS, SEASONAL 03/26/2008  . RENAL DISEASE, CHRONIC, STAGE III 03/26/2008  . DEGENERATIVE DISC DISEASE 01/15/2008  . Type 2 diabetes  mellitus with hyperlipidemia ([place]) 12/13/2007  . HYPERLIPIDEMIA 10/11/2006  . OBESITY NOS 10/11/2006  . ANXIETY 10/11/2006  . DEPRESSION 10/11/2006  . GLAUCOMA NOS 10/11/2006  . BENIGN POSITIONAL VERTIGO 10/11/2006  . Essential hypertension 10/11/2006  . GERD 10/11/2006  . OVERACTIVE BLADDER 10/11/2006  . OSTEOPOROSIS 10/11/2006  . BREAST CANCER, HX OF 10/11/2006  . CEREBROVASCULAR ACCIDENT, HX OF 10/11/2006    Past Surgical History:  Procedure Laterality Date  . Bladder tack  2000  . BREAST LUMPECTOMY  2004   right  . cataracts    . CHOLECYSTECTOMY  1960  . COLONOSCOPY  07/2007   lymphocytic colitis  . ESOPHAGOGASTRODUODENOSCOPY  06/24/2011   Procedure: ESOPHAGOGASTRODUODENOSCOPY (EGD);  Surgeon: Dorothyann Peng, MD;  Location: [facility];  Service: Endoscopy;  Laterality: N/A;.  Peptic stricture, mild erosive reflux esophagitis, hiatal  . FLEXIBLE SIGMOIDOSCOPY  11/25/2011   MODERATE DIVERTICULOSIS/INTERNAL HEMORRHOIDS  . S/P Hysterectomy  2000   benign reasons  . SAVORY DILATION  06/24/2011   Procedure: SAVORY DILATION;  Surgeon: Dorothyann Peng, MD;  Location: [facility];  Service: Endoscopy;  Laterality: N/A;     OB History   None      Home Medications    Prior to Admission medications   Medication Sig Start Date End Date Taking? Authorizing Provider  acetaminophen (TYLENOL) 500 MG  tablet Take 500 mg by mouth every 6 (six) hours as needed for mild pain.   Yes [provider]  amLODipine (NORVASC) 10 MG tablet Take 10 mg by mouth daily.     Yes [provider]  aspirin 81 MG tablet Take 81 mg by mouth daily.     Yes [provider]  cholecalciferol (VITAMIN D) 1000 units tablet Take 2,000 Units by mouth daily.   Yes [provider]  Cholecalciferol (VITAMIN D-1000 MAX ST) 1000 UNITS tablet Take 5,000 Units by mouth daily.    Yes [provider]  clonazePAM (KLONOPIN) 1 MG tablet Take 1 mg by mouth at bedtime. Take 1 tablet  by mouth at bedtime for sleep.   Yes [provider]  Cyanocobalamin (VITAMIN B-12 PO) Take 1,500 mg by mouth.     Yes [provider]  diltiazem (CARDIZEM) 120 MG tablet Take 120 mg by mouth 2 (two) times daily.   Yes [provider]  fish oil-omega-3 fatty acids 1000 MG capsule Take 2 g by mouth daily.     Yes [provider]  latanoprost (XALATAN) 0.005 % ophthalmic solution  06/02/11  Yes [provider]  loperamide (IMODIUM A-D) 2 MG tablet Take 2 mg by mouth 4 (four) times daily as needed for diarrhea or loose stools.   Yes [provider]  meclizine (ANTIVERT) 25 MG tablet Take 25 mg by mouth 4 (four) times daily as needed for dizziness.   Yes [provider]  omeprazole (PRILOSEC) 40 MG capsule Take 1 capsule by mouth daily. 01/23/18  Yes [provider]  timolol (TIMOPTIC) 0.5 % ophthalmic solution Place 1 drop into both eyes 2 (two) times daily.  01/17/18  Yes [provider]  ezetimibe (ZETIA) 10 MG tablet Take 1 tablet by mouth daily. 01/24/18   [provider]    Family History Family History  Problem Relation Age of Onset  . Colon cancer Neg Hx           . Liver disease Neg Hx           . GI problems Neg Hx             Social History Social History   Tobacco Use  . Smoking status: Former Smoker    Packs/day: 1.00    Types: Cigarettes  . Smokeless tobacco: Never Used  . Tobacco comment: 30 yrs ago  Substance Use Topics  . Alcohol use: No  . Drug use: No     Allergies   Metformin   Review of Systems Review of Systems  All other systems reviewed and are negative.    Physical Exam Updated Vital Signs BP 134/77   Pulse 77   Temp (!) 97 F (36.1 C) (Rectal)   Resp 20   Ht 5\' 2"  (1.575 m)   Wt 75.8 kg (167 lb)   SpO2 93%   BMI 30.54 kg/m   Physical Exam  Constitutional: She appears well-developed and well-nourished.  HENT:  Head: Normocephalic and atraumatic.    Right Ear: External ear normal.  Left Ear: External ear normal.  Nose: Nose normal.  Mouth/Throat: Oropharynx is clear and moist.  Eyes: Pupils are equal, round, and reactive to light. Conjunctivae and EOM are normal.  Neck: Normal range of motion. Neck supple.  Cardiovascular: Normal rate, regular rhythm, normal heart sounds and intact distal pulses.  Pulmonary/Chest: Effort normal and breath sounds normal.  Abdominal: Soft. Bowel sounds are normal.  Musculoskeletal: Normal range of motion. She exhibits edema.  Neurological: She is alert.  Pt initially oriented to person.  Now she is fully oriented.  Skin: Skin is warm. Capillary refill takes less than 2 seconds.  Psychiatric: She has a normal mood and affect. Her behavior is normal. Judgment and thought content normal.  Nursing note and vitals reviewed.    ED Treatments / Results  Labs (all labs ordered are listed, but only abnormal results are displayed) Labs Reviewed  COMPREHENSIVE METABOLIC PANEL - Abnormal; Notable for the following components:      Result Value   CO2 21 (*)    Glucose, Bld 258 (*)    BUN 35 (*)    Creatinine, Ser 1.76 (*)    GFR calc non Af Amer 24 (*)    GFR calc Af Amer 28 (*)    All other components within normal limits  CBC WITH DIFFERENTIAL/PLATELET - Abnormal; Notable for the following components:   RBC 3.50 (*)    Hemoglobin 10.1 (*)    HCT 31.1 (*)    All other components within normal limits  URINALYSIS, ROUTINE W REFLEX MICROSCOPIC - Abnormal; Notable for the following components:   Protein, ur 30 (*)    Bacteria, UA RARE (*)    Squamous Epithelial / LPF 0-5 (*)    All other components within normal limits  CBG MONITORING, ED - Abnormal; Notable for the following components:   Glucose-Capillary 230 (*)    All other components within normal limits  URINE CULTURE  I-STAT CG4 LACTIC ACID, ED    EKG EKG Interpretation  Date/Time:  Friday March 02 2018 21:50:54 EDT Ventricular Rate:   80 PR Interval:    QRS Duration: 104 QT Interval:  401 QTC Calculation: 463 R Axis:   -25 Text Interpretation:  Sinus rhythm Inferoposterior infarct, old No old tracing to compare Confirmed by Isla Pence [phone]) on 03/02/2018 9:53:48 PM   Radiology Dg Chest 2 View  Result Date: 03/02/2018 CLINICAL DATA:  Altered level of consciousness. EXAM: CHEST - 2 VIEW COMPARISON:  02/09/1989 FINDINGS: Stable cardiomegaly with aortic atherosclerosis. There is uncoiling of the thoracic aorta as before without definite aneurysmal dilatation. Mild vascular congestion without acute pulmonary consolidation, effusion or pneumothorax. No acute osseous abnormality. Degenerative changes are present along the dorsal spine. IMPRESSION: Cardiomegaly with aortic atherosclerosis.  Mild vascular congestion. Electronically Signed   By: Ashley Royalty M.D.   On: 03/02/2018 22:18   Ct Head Wo Contrast  Result Date: 03/02/2018 CLINICAL DATA:  Altered level of consciousness EXAM: CT HEAD WITHOUT CONTRAST TECHNIQUE: Contiguous axial images were obtained from the base of the skull through the vertex without intravenous contrast. COMPARISON:  01/02/2011 FINDINGS: BRAIN: There is sulcal and ventricular prominence consistent with superficial and central atrophy. No intraparenchymal hemorrhage, mass effect nor midline shift. Periventricular and subcortical white matter hypodensities consistent with chronic small vessel ischemic disease are identified. No acute large vascular territory infarcts. Encephalomalacia of the left posterior parietooccipital lobe from remote infarct. Small right-sided basal ganglial lacunar infarcts. Idiopathic bilateral basal ganglial calcifications. No abnormal extra-axial fluid collections. Basal cisterns are not effaced and midline. VASCULAR: Moderate calcific atherosclerosis of the carotid siphons. SKULL: No skull fracture. No significant scalp soft tissue swelling. SINUSES/ORBITS: The mastoid air-cells are  clear. The included paranasal sinuses are well-aerated.The included ocular globes and orbital contents are non-suspicious. OTHER: None. IMPRESSION: 1. Atrophy with moderate chronic small vessel ischemia. 2. No acute intracranial abnormality. 3. 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 Electronically Signed   By: Ashley Royalty M.D.   On: 03/02/2018 22:08    Procedures Procedures (including critical care time)  Medications Ordered in ED Medications - No data to display   Initial Impression / Assessment and Plan / ED Course  I have reviewed the triage vital signs and the nursing notes.  Pertinent labs & imaging results that were available during my care of the patient were reviewed by me and considered in my medical decision making (see chart for details).    Family is here now and say she has been sleeping a lot.  Her grandson looked at her meds and saw they recently changed her clonazepam from 1 mg only at night to 1 mg TID.  That is likely the cause of her sleepiness.  The sleepiness is probably why she has not been drinking enough.  The pt was able to get up and walk with assistance.  She looks and feels better.  Pt to return if worse.  F/u with pcp.  Final Clinical Impressions(s) / ED Diagnoses   Final diagnoses:  Dehydration  CKD (chronic kidney disease) stage 4, GFR 15-29 ml/min [facility])    ED Discharge Orders    None       Isla Pence, MD 03/02/18 2319

## 2018-03-02 NOTE — ED Triage Notes (Signed)
Pt brought in by [facility] for c/o altered loc; pt is usually alert and oriented; pt was recently discharged from the hospital for a kidney infection; pt cbg 268; pt has some bilateral pedal edema; pt is from [facility] [place]; pt only able to state her name and dob; pt denies any pain

## 2018-03-02 NOTE — ED Notes (Signed)
Pt ambulated in the hallway with 1 person assist.

## 2018-03-02 NOTE — Discharge Instructions (Addendum)
Clonazepam needs to be given ONLY at night.  Not 3 times a day.

## 2018-03-04 LAB — URINE CULTURE: CULTURE: NO GROWTH

## 2018-03-05 DIAGNOSIS — G4700 Insomnia, unspecified: Secondary | ICD-10-CM | POA: Diagnosis not present

## 2018-03-05 DIAGNOSIS — R600 Localized edema: Secondary | ICD-10-CM | POA: Diagnosis not present

## 2018-03-05 DIAGNOSIS — Z634 Disappearance and death of family member: Secondary | ICD-10-CM | POA: Diagnosis not present

## 2018-03-05 DIAGNOSIS — F419 Anxiety disorder, unspecified: Secondary | ICD-10-CM | POA: Diagnosis not present

## 2018-03-13 DIAGNOSIS — R609 Edema, unspecified: Secondary | ICD-10-CM | POA: Diagnosis not present

## 2018-03-13 DIAGNOSIS — I509 Heart failure, unspecified: Secondary | ICD-10-CM | POA: Diagnosis not present

## 2018-03-16 DIAGNOSIS — M79672 Pain in left foot: Secondary | ICD-10-CM | POA: Diagnosis not present

## 2018-03-17 DIAGNOSIS — Z79899 Other long term (current) drug therapy: Secondary | ICD-10-CM | POA: Diagnosis not present

## 2018-03-17 DIAGNOSIS — J069 Acute upper respiratory infection, unspecified: Secondary | ICD-10-CM | POA: Diagnosis not present

## 2018-03-17 DIAGNOSIS — R0602 Shortness of breath: Secondary | ICD-10-CM | POA: Diagnosis not present

## 2018-03-17 DIAGNOSIS — R05 Cough: Secondary | ICD-10-CM | POA: Diagnosis not present

## 2018-03-17 DIAGNOSIS — I509 Heart failure, unspecified: Secondary | ICD-10-CM | POA: Diagnosis not present

## 2018-03-18 ENCOUNTER — Other Ambulatory Visit: Payer: Self-pay

## 2018-03-18 ENCOUNTER — Emergency Department (HOSPITAL_COMMUNITY): Payer: PPO

## 2018-03-18 ENCOUNTER — Inpatient Hospital Stay (HOSPITAL_COMMUNITY)
Admission: EM | Admit: 2018-03-18 | Discharge: 2018-03-20 | DRG: 637 | Disposition: A | Discharge status: Home Health | Payer: PPO | Source: Skilled Nursing Facility | Attending: Internal Medicine | Admitting: Internal Medicine

## 2018-03-18 ENCOUNTER — Encounter (HOSPITAL_COMMUNITY): Payer: Self-pay | Admitting: Cardiology

## 2018-03-18 DIAGNOSIS — N184 Chronic kidney disease, stage 4 (severe): Secondary | ICD-10-CM | POA: Diagnosis present

## 2018-03-18 DIAGNOSIS — E1100 Type 2 diabetes mellitus with hyperosmolarity without nonketotic hyperglycemic-hyperosmolar coma (NKHHC): Secondary | ICD-10-CM | POA: Diagnosis not present

## 2018-03-18 DIAGNOSIS — Z7984 Long term (current) use of oral hypoglycemic drugs: Secondary | ICD-10-CM | POA: Diagnosis not present

## 2018-03-18 DIAGNOSIS — Z87891 Personal history of nicotine dependence: Secondary | ICD-10-CM | POA: Diagnosis not present

## 2018-03-18 DIAGNOSIS — E1065 Type 1 diabetes mellitus with hyperglycemia: Secondary | ICD-10-CM | POA: Diagnosis not present

## 2018-03-18 DIAGNOSIS — K219 Gastro-esophageal reflux disease without esophagitis: Secondary | ICD-10-CM | POA: Diagnosis not present

## 2018-03-18 DIAGNOSIS — S0010XA Contusion of unspecified eyelid and periocular area, initial encounter: Secondary | ICD-10-CM | POA: Diagnosis not present

## 2018-03-18 DIAGNOSIS — I509 Heart failure, unspecified: Secondary | ICD-10-CM | POA: Diagnosis not present

## 2018-03-18 DIAGNOSIS — R600 Localized edema: Secondary | ICD-10-CM

## 2018-03-18 DIAGNOSIS — Z8673 Personal history of transient ischemic attack (TIA), and cerebral infarction without residual deficits: Secondary | ICD-10-CM | POA: Diagnosis not present

## 2018-03-18 DIAGNOSIS — S0083XA Contusion of other part of head, initial encounter: Secondary | ICD-10-CM | POA: Diagnosis present

## 2018-03-18 DIAGNOSIS — E1122 Type 2 diabetes mellitus with diabetic chronic kidney disease: Secondary | ICD-10-CM | POA: Diagnosis not present

## 2018-03-18 DIAGNOSIS — W1830XA Fall on same level, unspecified, initial encounter: Secondary | ICD-10-CM | POA: Diagnosis present

## 2018-03-18 DIAGNOSIS — Z66 Do not resuscitate: Secondary | ICD-10-CM | POA: Diagnosis not present

## 2018-03-18 DIAGNOSIS — S0990XA Unspecified injury of head, initial encounter: Secondary | ICD-10-CM | POA: Diagnosis not present

## 2018-03-18 DIAGNOSIS — S99922A Unspecified injury of left foot, initial encounter: Secondary | ICD-10-CM | POA: Diagnosis not present

## 2018-03-18 DIAGNOSIS — J9601 Acute respiratory failure with hypoxia: Secondary | ICD-10-CM | POA: Diagnosis not present

## 2018-03-18 DIAGNOSIS — D638 Anemia in other chronic diseases classified elsewhere: Secondary | ICD-10-CM | POA: Diagnosis present

## 2018-03-18 DIAGNOSIS — H409 Unspecified glaucoma: Secondary | ICD-10-CM | POA: Diagnosis present

## 2018-03-18 DIAGNOSIS — F0390 Unspecified dementia without behavioral disturbance: Secondary | ICD-10-CM | POA: Diagnosis present

## 2018-03-18 DIAGNOSIS — Z9049 Acquired absence of other specified parts of digestive tract: Secondary | ICD-10-CM

## 2018-03-18 DIAGNOSIS — I130 Hypertensive heart and chronic kidney disease with heart failure and stage 1 through stage 4 chronic kidney disease, or unspecified chronic kidney disease: Secondary | ICD-10-CM | POA: Diagnosis not present

## 2018-03-18 DIAGNOSIS — Z7982 Long term (current) use of aspirin: Secondary | ICD-10-CM | POA: Diagnosis not present

## 2018-03-18 DIAGNOSIS — M542 Cervicalgia: Secondary | ICD-10-CM | POA: Diagnosis not present

## 2018-03-18 DIAGNOSIS — R739 Hyperglycemia, unspecified: Secondary | ICD-10-CM | POA: Diagnosis present

## 2018-03-18 DIAGNOSIS — E785 Hyperlipidemia, unspecified: Secondary | ICD-10-CM | POA: Diagnosis present

## 2018-03-18 DIAGNOSIS — T1490XA Injury, unspecified, initial encounter: Secondary | ICD-10-CM | POA: Diagnosis not present

## 2018-03-18 DIAGNOSIS — I340 Nonrheumatic mitral (valve) insufficiency: Secondary | ICD-10-CM | POA: Diagnosis not present

## 2018-03-18 DIAGNOSIS — M810 Age-related osteoporosis without current pathological fracture: Secondary | ICD-10-CM | POA: Diagnosis present

## 2018-03-18 DIAGNOSIS — I110 Hypertensive heart disease with heart failure: Secondary | ICD-10-CM | POA: Diagnosis not present

## 2018-03-18 DIAGNOSIS — S199XXA Unspecified injury of neck, initial encounter: Secondary | ICD-10-CM | POA: Diagnosis not present

## 2018-03-18 DIAGNOSIS — J206 Acute bronchitis due to rhinovirus: Secondary | ICD-10-CM | POA: Diagnosis not present

## 2018-03-18 DIAGNOSIS — E876 Hypokalemia: Secondary | ICD-10-CM | POA: Diagnosis not present

## 2018-03-18 DIAGNOSIS — S2191XA Laceration without foreign body of unspecified part of thorax, initial encounter: Secondary | ICD-10-CM | POA: Diagnosis not present

## 2018-03-18 DIAGNOSIS — M79672 Pain in left foot: Secondary | ICD-10-CM | POA: Diagnosis not present

## 2018-03-18 DIAGNOSIS — R51 Headache: Secondary | ICD-10-CM | POA: Diagnosis not present

## 2018-03-18 DIAGNOSIS — E1165 Type 2 diabetes mellitus with hyperglycemia: Secondary | ICD-10-CM | POA: Diagnosis not present

## 2018-03-18 LAB — TROPONIN I: Troponin I: <0.03 ng/mL (ref <0.03)

## 2018-03-18 LAB — BASIC METABOLIC PANEL
ANION GAP: 13 (ref 5–15)
Anion gap: 14 (ref 5–15)
BUN: 35 mg/dL — AB (ref 6–20)
BUN: 36 mg/dL — ABNORMAL HIGH (ref 6–20)
CALCIUM: 8.8 mg/dL — AB (ref 8.9–10.3)
CHLORIDE: 103 mmol/L (ref 101–111)
CO2: 23 mmol/L (ref 22–32)
CO2: 24 mmol/L (ref 22–32)
CREATININE: 1.95 mg/dL — AB (ref 0.44–1.00)
Calcium: 8.7 mg/dL — ABNORMAL LOW (ref 8.9–10.3)
Chloride: 102 mmol/L (ref 101–111)
Creatinine, Ser: 2.08 mg/dL — ABNORMAL HIGH (ref 0.44–1.00)
GFR calc Af Amer: 23 mL/min — ABNORMAL LOW (ref >60)
GFR calc non Af Amer: 20 mL/min — ABNORMAL LOW (ref >60)
GFR, EST AFRICAN AMERICAN: 25 mL/min — AB (ref >60)
GFR, EST NON AFRICAN AMERICAN: 21 mL/min — AB (ref >60)
GLUCOSE: 519 mg/dL — AB (ref 65–99)
Glucose, Bld: 455 mg/dL — ABNORMAL HIGH (ref 65–99)
POTASSIUM: 3.6 mmol/L (ref 3.5–5.1)
Potassium: 3.3 mmol/L — ABNORMAL LOW (ref 3.5–5.1)
SODIUM: 139 mmol/L (ref 135–145)
Sodium: 140 mmol/L (ref 135–145)

## 2018-03-18 LAB — CBC
HEMATOCRIT: 27.8 % — AB (ref 36.0–46.0)
Hemoglobin: 8.9 g/dL — ABNORMAL LOW (ref 12.0–15.0)
MCH: 28.6 pg (ref 26.0–34.0)
MCHC: 32 g/dL (ref 30.0–36.0)
MCV: 89.4 fL (ref 78.0–100.0)
Platelets: 228 10*3/uL (ref 150–400)
RBC: 3.11 MIL/uL — ABNORMAL LOW (ref 3.87–5.11)
RDW: 14 % (ref 11.5–15.5)
WBC: 5.7 10*3/uL (ref 4.0–10.5)

## 2018-03-18 LAB — TSH: TSH: 1.422 u[IU]/mL (ref 0.350–4.500)

## 2018-03-18 LAB — GLUCOSE, CAPILLARY
GLUCOSE-CAPILLARY: 431 mg/dL — AB (ref 65–99)
Glucose-Capillary: 442 mg/dL — ABNORMAL HIGH (ref 65–99)

## 2018-03-18 LAB — BRAIN NATRIURETIC PEPTIDE: B Natriuretic Peptide: 608 pg/mL — ABNORMAL HIGH (ref 0.0–100.0)

## 2018-03-18 LAB — MRSA PCR SCREENING: MRSA by PCR: NEGATIVE

## 2018-03-18 LAB — CBG MONITORING, ED: Glucose-Capillary: 417 mg/dL — ABNORMAL HIGH (ref 65–99)

## 2018-03-18 MED ORDER — INSULIN ASPART 100 UNIT/ML ~~LOC~~ SOLN
8.0000 [IU] | Freq: Once | SUBCUTANEOUS | Status: AC
Start: 2018-03-18 — End: 2018-03-18
  Administered 2018-03-18: 8 [IU] via SUBCUTANEOUS
  Filled 2018-03-18: qty 1

## 2018-03-18 MED ORDER — EZETIMIBE 10 MG PO TABS
10.0000 mg | ORAL_TABLET | Freq: Every day | ORAL | Status: DC
  Administered 2018-03-18 – 2018-03-19 (×2): 10 mg via ORAL
  Filled 2018-03-18 (×2): qty 1

## 2018-03-18 MED ORDER — BUDESONIDE 0.5 MG/2ML IN SUSP: 0.5000 mg | Freq: Two times a day (BID) | RESPIRATORY_TRACT | Status: DC

## 2018-03-18 MED ORDER — SODIUM CHLORIDE 0.9 % IV SOLN
INTRAVENOUS | Status: DC
  Administered 2018-03-18: 22:00:00 via INTRAVENOUS

## 2018-03-18 MED ORDER — IPRATROPIUM-ALBUTEROL 0.5-2.5 (3) MG/3ML IN SOLN
3.0000 mL | Freq: Three times a day (TID) | RESPIRATORY_TRACT | Status: DC
  Administered 2018-03-19 – 2018-03-20 (×5): 3 mL via RESPIRATORY_TRACT
  Filled 2018-03-18 (×5): qty 3

## 2018-03-18 MED ORDER — POTASSIUM CHLORIDE 10 MEQ/100ML IV SOLN
10.0000 meq | INTRAVENOUS | Status: AC
  Administered 2018-03-18 – 2018-03-19 (×2): 10 meq via INTRAVENOUS
  Filled 2018-03-18 (×2): qty 100

## 2018-03-18 MED ORDER — INSULIN ASPART 100 UNIT/ML ~~LOC~~ SOLN
15.0000 [IU] | Freq: Once | SUBCUTANEOUS | Status: AC
  Administered 2018-03-18: 15 [IU] via SUBCUTANEOUS

## 2018-03-18 MED ORDER — BUDESONIDE 0.5 MG/2ML IN SUSP
0.5000 mg | Freq: Two times a day (BID) | RESPIRATORY_TRACT | Status: DC
  Administered 2018-03-18 – 2018-03-20 (×4): 0.5 mg via RESPIRATORY_TRACT
  Filled 2018-03-18 (×3): qty 2

## 2018-03-18 MED ORDER — VITAMIN D 1000 UNITS PO TABS
5000.0000 [IU] | ORAL_TABLET | Freq: Every day | ORAL | Status: DC
  Administered 2018-03-19 – 2018-03-20 (×2): 5000 [IU] via ORAL
  Filled 2018-03-18 (×2): qty 5

## 2018-03-18 MED ORDER — METHYLPREDNISOLONE SODIUM SUCC 125 MG IJ SOLR
60.0000 mg | Freq: Two times a day (BID) | INTRAMUSCULAR | Status: DC
  Administered 2018-03-18 – 2018-03-19 (×2): 60 mg via INTRAVENOUS
  Filled 2018-03-18 (×2): qty 2

## 2018-03-18 MED ORDER — OMEGA-3-ACID ETHYL ESTERS 1 G PO CAPS
2.0000 g | ORAL_CAPSULE | Freq: Every day | ORAL | Status: DC
  Administered 2018-03-19 – 2018-03-20 (×2): 2 g via ORAL
  Filled 2018-03-18 (×2): qty 2

## 2018-03-18 MED ORDER — VITAMIN B-12 1000 MCG PO TABS
1000.0000 ug | ORAL_TABLET | Freq: Every day | ORAL | Status: DC
Start: 2018-03-19 — End: 2018-03-20
  Administered 2018-03-19 – 2018-03-20 (×2): 1000 ug via ORAL
  Filled 2018-03-18 (×2): qty 1

## 2018-03-18 MED ORDER — VITAMIN D 1000 UNITS PO TABS: 2000.0000 [IU] | ORAL_TABLET | Freq: Every day | ORAL | Status: DC

## 2018-03-18 MED ORDER — HEPARIN SODIUM (PORCINE) 5000 UNIT/ML IJ SOLN
5000.0000 [IU] | Freq: Three times a day (TID) | INTRAMUSCULAR | Status: DC
  Administered 2018-03-18 – 2018-03-20 (×5): 5000 [IU] via SUBCUTANEOUS
  Filled 2018-03-18 (×5): qty 1

## 2018-03-18 MED ORDER — INSULIN REGULAR BOLUS VIA INFUSION
15.0000 [IU] | Freq: Once | INTRAVENOUS | Status: DC
  Filled 2018-03-18: qty 15

## 2018-03-18 MED ORDER — CLONAZEPAM 0.5 MG PO TABS
1.0000 mg | ORAL_TABLET | Freq: Every day | ORAL | Status: DC
  Administered 2018-03-18 – 2018-03-19 (×2): 1 mg via ORAL
  Filled 2018-03-18 (×2): qty 2

## 2018-03-18 MED ORDER — IPRATROPIUM-ALBUTEROL 0.5-2.5 (3) MG/3ML IN SOLN
3.0000 mL | Freq: Once | RESPIRATORY_TRACT | Status: AC
  Administered 2018-03-18: 3 mL via RESPIRATORY_TRACT
  Filled 2018-03-18: qty 3

## 2018-03-18 MED ORDER — IPRATROPIUM-ALBUTEROL 0.5-2.5 (3) MG/3ML IN SOLN
3.0000 mL | Freq: Four times a day (QID) | RESPIRATORY_TRACT | Status: DC
  Administered 2018-03-18: 3 mL via RESPIRATORY_TRACT

## 2018-03-18 MED ORDER — INSULIN ASPART 100 UNIT/ML ~~LOC~~ SOLN
0.0000 [IU] | SUBCUTANEOUS | Status: DC
  Administered 2018-03-19: 20 [IU] via SUBCUTANEOUS

## 2018-03-18 MED ORDER — SODIUM CHLORIDE 0.9 % IV SOLN
INTRAVENOUS | Status: DC
  Filled 2018-03-18: qty 1

## 2018-03-18 MED ORDER — PANTOPRAZOLE SODIUM 40 MG PO TBEC
80.0000 mg | DELAYED_RELEASE_TABLET | Freq: Every day | ORAL | Status: DC
  Administered 2018-03-19 – 2018-03-20 (×2): 80 mg via ORAL
  Filled 2018-03-18 (×2): qty 2

## 2018-03-18 MED ORDER — FUROSEMIDE 10 MG/ML IJ SOLN
40.0000 mg | Freq: Once | INTRAMUSCULAR | Status: AC
Start: 2018-03-18 — End: 2018-03-18
  Administered 2018-03-18: 40 mg via INTRAVENOUS
  Filled 2018-03-18: qty 4

## 2018-03-18 MED ORDER — TIMOLOL MALEATE 0.5 % OP SOLN
1.0000 [drp] | Freq: Two times a day (BID) | OPHTHALMIC | Status: DC
  Administered 2018-03-19 – 2018-03-20 (×3): 1 [drp] via OPHTHALMIC
  Filled 2018-03-18 (×3): qty 5

## 2018-03-18 MED ORDER — AMLODIPINE BESYLATE 5 MG PO TABS: 10.0000 mg | ORAL_TABLET | Freq: Every day | ORAL | Status: DC

## 2018-03-18 MED ORDER — DEXTROSE-NACL 5-0.45 % IV SOLN: INTRAVENOUS | Status: DC

## 2018-03-18 MED ORDER — ASPIRIN 81 MG PO CHEW
81.0000 mg | CHEWABLE_TABLET | Freq: Every day | ORAL | Status: DC
  Administered 2018-03-19 – 2018-03-20 (×2): 81 mg via ORAL
  Filled 2018-03-18 (×2): qty 1

## 2018-03-18 NOTE — ED Triage Notes (Addendum)
Pt states she lost her balance this morning and fell and hit head on the bed.  Bruising and swelling with small laceration to left eye area.  Denies any LOC.    Pt was also seen at [facility]  yesterday and diagnosed with a respiratory infection.  Productive yellow cough.  Audible wheezing.  Denies any SOB.  RA SATS  90%.  CBG  539.

## 2018-03-18 NOTE — Progress Notes (Addendum)
Patient admitted earlier this afternoon with nonketotic hyperosmolar state and has not been started on IV fluid and insulin as ordered.  She has just arrived to the ED with baseline mentation and denies any nausea or vomiting.  Anion gap is 13 and blood glucose is currently 442mg /dL on fingerstick with recent BMP level of 455mg /dL.  Patient also has hypokalemia with potassium 3.3.   The potassium is low for which supplementation will now begin, but given the low reading I feel that an insulin infusion at this point will simply aggravate the hypokalemia.  IV fluid and potassium supplementation will be given as well as a one-time dose of 15 units IV insulin with careful monitoring of repeat BMP every 4 hours as ordered previously.  I have also ordered for the patient to be placed on resistant sliding scale insulin with every 4 hour Accu-Cheks at this point and will continue to follow.  She will require long-acting insulin when she eats.

## 2018-03-18 NOTE — ED Provider Notes (Addendum)
[facility] EMERGENCY DEPARTMENT Provider Note   CSN: [id number] Arrival date & time: 03/18/18  6761     History   Chief Complaint Chief Complaint  Patient presents with  . Fall    HPI Nancy Medina is a 84 y.o. female.  HPI Patient presents to the emergency room for evaluation after a fall today.  Patient has had some trouble with cough and congestion for the last few days.  She was seen at [facility] emergency room yesterday and was diagnosed with a respiratory infection according to the EMS report.  Patient states she was getting up to walk this morning where she thinks she must have either lost her balance or tripped.  She fell hitting the left side of her face.  Patient also has noticed some bruising in her left foot patient has been coughing and has been wheezing.  Patient states she has some back pain but this is a chronic issue for her.  She is bringing up yellow sputum.  She denies any chest pain.  She does not think she lost consciousness.  She denies any focal numbness or weakness. Past Medical History:  Diagnosis Date  . Achilles tendinitis   . Allergic rhinitis   . Anemia of chronic disease    at least back to 2010  . Anxiety   . Benign paroxysmal positional vertigo   . Cataract   . Cerebrovascular accident ([place])   . Chronic kidney disease (CKD), stage III (moderate) (HCC)   . Depression   . DJD (degenerative joint disease)   . DM (diabetes mellitus) ([place])    type II  . GERD (gastroesophageal reflux disease)   . HTN (hypertension)   . HX: breast cancer   . Hyperlipidemia   . Hypertonicity of bladder   . Knee pain, left   . Leg pain   . Lymphocytic colitis 2008   treated with Lialda short-term  . Obesity   . Osteoporosis, unspecified   . Renal disease    stage III  . Unspecified fall   . Unspecified glaucoma(365.9)     Patient Active Problem List   Diagnosis Date Noted  . AKI (acute kidney injury) ([place]) 02/09/2018  . Acute lower UTI 02/09/2018  .  Nausea 02/09/2018  . Constipation 02/09/2018  . Diarrhea 11/25/2011  . Lymphocytic colitis 06/15/2011  . Esophageal dysphagia 06/15/2011  . UNSPECIFIED ANEMIA 04/27/2009  . DIASTOLIC DYSFUNCTION 95/07/3266  . ACHILLES TENDINITIS 10/27/2008  . PERIPHERAL EDEMA 10/14/2008  . ALLERGIC RHINITIS, SEASONAL 03/26/2008  . RENAL DISEASE, CHRONIC, STAGE III 03/26/2008  . DEGENERATIVE DISC DISEASE 01/15/2008  . Type 2 diabetes mellitus with hyperlipidemia ([place]) 12/13/2007  . HYPERLIPIDEMIA 10/11/2006  . OBESITY NOS 10/11/2006  . ANXIETY 10/11/2006  . DEPRESSION 10/11/2006  . GLAUCOMA NOS 10/11/2006  . BENIGN POSITIONAL VERTIGO 10/11/2006  . Essential hypertension 10/11/2006  . GERD 10/11/2006  . OVERACTIVE BLADDER 10/11/2006  . OSTEOPOROSIS 10/11/2006  . BREAST CANCER, HX OF 10/11/2006  . CEREBROVASCULAR ACCIDENT, HX OF 10/11/2006    Past Surgical History:  Procedure Laterality Date  . Bladder tack  2000  . BREAST LUMPECTOMY  2004   right  . cataracts    . CHOLECYSTECTOMY  1960  . COLONOSCOPY  07/2007   lymphocytic colitis  . ESOPHAGOGASTRODUODENOSCOPY  06/24/2011   Procedure: ESOPHAGOGASTRODUODENOSCOPY (EGD);  Surgeon: Dorothyann Peng, MD;  Location: [facility] ENDO SUITE;  Service: Endoscopy;  Laterality: N/A;.  Peptic stricture, mild erosive reflux esophagitis, hiatal  . FLEXIBLE SIGMOIDOSCOPY  11/25/2011  MODERATE DIVERTICULOSIS/INTERNAL HEMORRHOIDS  . S/P Hysterectomy  2000   benign reasons  . SAVORY DILATION  06/24/2011   Procedure: SAVORY DILATION;  Surgeon: Dorothyann Peng, MD;  Location: [facility] ENDO SUITE;  Service: Endoscopy;  Laterality: N/A;     OB History   None      Home Medications    Prior to Admission medications   Medication Sig Start Date End Date Taking? Authorizing Provider  acetaminophen (TYLENOL) 500 MG tablet Take 500 mg by mouth every 6 (six) hours as needed for mild pain.    [provider]  amLODipine (NORVASC) 10 MG tablet Take 10 mg by mouth daily.       [provider]  aspirin 81 MG tablet Take 81 mg by mouth daily.      [provider]  cholecalciferol (VITAMIN D) 1000 units tablet Take 2,000 Units by mouth daily.    [provider]  Cholecalciferol (VITAMIN D-1000 MAX ST) 1000 UNITS tablet Take 5,000 Units by mouth daily.     [provider]  clonazePAM (KLONOPIN) 1 MG tablet Take 1 mg by mouth at bedtime. Take 1 tablet by mouth at bedtime for sleep.    [provider]  Cyanocobalamin (VITAMIN B-12 PO) Take 1,500 mg by mouth.      [provider]  diltiazem (CARDIZEM) 120 MG tablet Take 120 mg by mouth 2 (two) times daily.    [provider]  ezetimibe (ZETIA) 10 MG tablet Take 1 tablet by mouth daily. 01/24/18   [provider]  fish oil-omega-3 fatty acids 1000 MG capsule Take 2 g by mouth daily.      [provider]  latanoprost (XALATAN) 0.005 % ophthalmic solution  06/02/11   [provider]  loperamide (IMODIUM A-D) 2 MG tablet Take 2 mg by mouth 4 (four) times daily as needed for diarrhea or loose stools.    [provider]  meclizine (ANTIVERT) 25 MG tablet Take 25 mg by mouth 4 (four) times daily as needed for dizziness.    [provider]  omeprazole (PRILOSEC) 40 MG capsule Take 1 capsule by mouth daily. 01/23/18   [provider]  timolol (TIMOPTIC) 0.5 % ophthalmic solution Place 1 drop into both eyes 2 (two) times daily.  01/17/18   [provider]    Family History Family History  Problem Relation Age of Onset  . Colon cancer Neg Hx           . Liver disease Neg Hx           . GI problems Neg Hx             Social History Social History   Tobacco Use  . Smoking status: Former Smoker    Packs/day: 1.00    Types: Cigarettes  . Smokeless tobacco: Never Used  . Tobacco comment: 30 yrs ago  Substance Use Topics  . Alcohol use: No  . Drug use: No     Allergies   Metformin   Review of  Systems Review of Systems  All other systems reviewed and are negative.    Physical Exam Updated Vital Signs BP (!) 125/56   Pulse 73   Temp (!) 97.5 F (36.4 C) (Oral)   Resp 17   Ht 1.575 m (5\' 2" )   Wt 75.8 kg (167 lb)   SpO2 100%   BMI 30.54 kg/m   Physical Exam  Constitutional:  Elderly, frail  HENT:  Head:  Normocephalic.  Right Ear: External ear normal.  Left Ear: External ear normal.  Bruising around the left eye, facial laceration below the left eyebrow, no active bleeding, no bony tenderness palpation of the face or crepitus noted  Eyes: Conjunctivae are normal. Right eye exhibits no discharge. Left eye exhibits no discharge. No scleral icterus.  Neck: Neck supple. No tracheal deviation present.  Cardiovascular: Normal rate, regular rhythm and intact distal pulses.  Pulmonary/Chest: Accessory muscle usage present. No stridor. No respiratory distress. She has wheezes. She has no rales.  Abdominal: Soft. Bowel sounds are normal. She exhibits no distension. There is no tenderness. There is no rebound and no guarding.  Musculoskeletal: She exhibits edema ( Bilateral lower extremities).       Cervical back: She exhibits tenderness.       Thoracic back: Normal.       Lumbar back: Normal.       Left foot: There is no deformity.  Few small bruises noted on the left toes, no deformity  Neurological: She is alert. She has normal strength. No cranial nerve deficit (no facial droop, extraocular movements intact, no slurred speech) or sensory deficit. She exhibits normal muscle tone. She displays no seizure activity. Coordination normal.  Skin: Skin is warm and dry. No rash noted. She is not diaphoretic.  Psychiatric: She has a normal mood and affect.  Nursing note and vitals reviewed.    ED Treatments / Results  Labs (all labs ordered are listed, but only abnormal results are displayed) Labs Reviewed  BASIC METABOLIC PANEL - Abnormal; Notable for the following  components:      Result Value   Glucose, Bld 519 (*)    BUN 35 (*)    Creatinine, Ser 2.08 (*)    Calcium 8.7 (*)    GFR calc non Af Amer 20 (*)    GFR calc Af Amer 23 (*)    All other components within normal limits  CBC - Abnormal; Notable for the following components:   RBC 3.11 (*)    Hemoglobin 8.9 (*)    HCT 27.8 (*)    All other components within normal limits  BRAIN NATRIURETIC PEPTIDE - Abnormal; Notable for the following components:   B Natriuretic Peptide 608.0 (*)    All other components within normal limits  TROPONIN I    EKG EKG Interpretation  Date/Time:  Sunday March 18 2018 10:26:24 EDT Ventricular Rate:  72 PR Interval:    QRS Duration: 97 QT Interval:  441 QTC Calculation: 483 R Axis:   -9 Text Interpretation:  Sinus rhythm Atrial premature complex Low voltage, extremity and precordial leads Abnormal R-wave progression, early transition Minimal ST elevation, lateral leads No significant change since last tracing Confirmed by Dorie Rank [phone]) on 03/18/2018 10:30:28 AM   Radiology Dg Chest 2 View  Result Date: 03/18/2018 CLINICAL DATA:  Bruising and swelling.  Small laceration. EXAM: CHEST - 2 VIEW COMPARISON:  March 02, 2018 FINDINGS: Mild cardiomegaly. The hila, mediastinum, lungs, and pleura are otherwise unremarkable. IMPRESSION: No active cardiopulmonary disease. Electronically Signed   By: Dorise Bullion III M.D   On: 03/18/2018 10:46   Ct Head Wo Contrast  Result Date: 03/18/2018 CLINICAL DATA:  Pain after fall. EXAM: CT HEAD WITHOUT CONTRAST CT CERVICAL SPINE WITHOUT CONTRAST TECHNIQUE: Multidetector CT imaging of the head and cervical spine was performed following the standard protocol without intravenous contrast. Multiplanar CT image reconstructions of the cervical spine were also generated. COMPARISON:  January 02, 2011 FINDINGS: CT HEAD FINDINGS Brain: No subdural, epidural, or subarachnoid hemorrhage. Ventricles and sulci are stable. A left  occipital/temporal infarct is stable. Lacunar infarcts in the cerebellum are stable. The brainstem and basal cisterns are normal. White matter changes persist. No midline shift. Vascular: Calcified atherosclerosis is seen in the intracranial carotids. Skull: Normal. Negative for fracture or focal lesion. Sinuses/Orbits: No acute finding. Other: Soft tissue swelling over the left forehead and periorbital region. The underlying globe is intact. Extracranial soft tissues are otherwise normal. CT CERVICAL SPINE FINDINGS Alignment: There is minimal anterolisthesis of C2 versus C3 measuring approximately 2 mm. No other malalignment. No fractures are seen. The pre odontoid space is normal. Skull base and vertebrae: No acute fracture. No primary bone lesion or focal pathologic process. Soft tissues and spinal canal: No prevertebral fluid or swelling. No visible canal hematoma. Disc levels: Multilevel degenerative disc disease with small anterior and posterior osteophytes. Facet degenerative changes. Upper chest: Negative. Other: No other abnormalities. IMPRESSION: 1. No acute intracranial abnormality. Chronic left occipital/temporal infarct, white matter changes, and cerebellar lacunar infarcts. 2. No fracture. No traumatic malalignment. Minimal anterolisthesis of C2 versus C3 is likely due to facet degenerative changes at this level. No fractures identified at this level. Electronically Signed   By: Dorise Bullion III M.D   On: 03/18/2018 10:34   Ct Cervical Spine Wo Contrast  Result Date: 03/18/2018 CLINICAL DATA:  Pain after fall. EXAM: CT HEAD WITHOUT CONTRAST CT CERVICAL SPINE WITHOUT CONTRAST TECHNIQUE: Multidetector CT imaging of the head and cervical spine was performed following the standard protocol without intravenous contrast. Multiplanar CT image reconstructions of the cervical spine were also generated. COMPARISON:  January 02, 2011 FINDINGS: CT HEAD FINDINGS Brain: No subdural, epidural, or subarachnoid  hemorrhage. Ventricles and sulci are stable. A left occipital/temporal infarct is stable. Lacunar infarcts in the cerebellum are stable. The brainstem and basal cisterns are normal. White matter changes persist. No midline shift. Vascular: Calcified atherosclerosis is seen in the intracranial carotids. Skull: Normal. Negative for fracture or focal lesion. Sinuses/Orbits: No acute finding. Other: Soft tissue swelling over the left forehead and periorbital region. The underlying globe is intact. Extracranial soft tissues are otherwise normal. CT CERVICAL SPINE FINDINGS Alignment: There is minimal anterolisthesis of C2 versus C3 measuring approximately 2 mm. No other malalignment. No fractures are seen. The pre odontoid space is normal. Skull base and vertebrae: No acute fracture. No primary bone lesion or focal pathologic process. Soft tissues and spinal canal: No prevertebral fluid or swelling. No visible canal hematoma. Disc levels: Multilevel degenerative disc disease with small anterior and posterior osteophytes. Facet degenerative changes. Upper chest: Negative. Other: No other abnormalities. IMPRESSION: 1. No acute intracranial abnormality. Chronic left occipital/temporal infarct, white matter changes, and cerebellar lacunar infarcts. 2. No fracture. No traumatic malalignment. Minimal anterolisthesis of C2 versus C3 is likely due to facet degenerative changes at this level. No fractures identified at this level. Electronically Signed   By: Dorise Bullion III M.D   On: 03/18/2018 10:34   Dg Foot 2 Views Left  Result Date: 03/18/2018 CLINICAL DATA:  Pain after fall EXAM: LEFT FOOT - 2 VIEW COMPARISON:  None. FINDINGS: Deformity of the proximal right fourth metacarpal is stable consistent with remote trauma. No acute fractures are seen. IMPRESSION: No acute fracture identified. Electronically Signed   By: Dorise Bullion III M.D   On: 03/18/2018 10:46    Procedures .Critical Care Performed by: Dorie Rank,  MD Authorized by: Dorie Rank,  MD   Critical care provider statement:    Critical care time (minutes):  45   Critical care was time spent personally by me on the following activities:  Discussions with consultants, evaluation of patient's response to treatment, examination of patient, ordering and performing treatments and interventions, ordering and review of laboratory studies, ordering and review of radiographic studies, pulse oximetry, re-evaluation of patient's condition, obtaining history from patient or surrogate and review of old charts   (including critical care time)  Medications Ordered in ED Medications  ipratropium-albuterol (DUONEB) 0.5-2.5 (3) MG/3ML nebulizer solution 3 mL (has no administration in time range)  insulin aspart (novoLOG) injection 8 Units (8 Units Subcutaneous Given 03/18/18 1149)  furosemide (LASIX) injection 40 mg (40 mg Intravenous Given 03/18/18 1149)     Initial Impression / Assessment and Plan / ED Course  I have reviewed the triage vital signs and the nursing notes.  Pertinent labs & imaging results that were available during my care of the patient were reviewed by me and considered in my medical decision making (see chart for details).  Clinical Course as of Mar 18 1150  Sun Mar 18, 2018  1131 Chronic renal insufficiency.  Similar to previous values.  Hyperglycemia is new  Basic metabolic panel(!!) [JK]  1423 Anemia is worse compared to 2 weeks ago  CBC(!) [JK]  1131 BNP is elevated  Brain natriuretic peptide (order if patient c/o SOB ONLY)(!) [JK]    Clinical Course User Index [JK] Dorie Rank, MD    Patient presented to the emergency room for evaluation after a fall.  On exam the patient was notably having some wheezing and labored breathing although not in any significant distress.  Her laboratory tests show an elevated BNP.  I suspect patient may be having a component of congestive heart failure although she does have a history of asthma and  wheezing.  Patient has required oxygen which is not normal for her.  I think she will need be admitted for further treatment.  No signs of any serious injuries associated with her fall.  X-rays and CT scans do not show any acute injuries.  Final Clinical Impressions(s) / ED Diagnoses   Final diagnoses:  Acute on chronic congestive heart failure, unspecified heart failure type ([place])  Hyperglycemia     Dorie Rank, MD 03/18/18 1153  Additional documentation   Dorie Rank, MD 03/26/18 [phone]

## 2018-03-18 NOTE — ED Notes (Signed)
Date and time results received: 11:25 AM   Test: glucose Critical Value: 519  Name of Provider Notified: knapp  Orders Received? Or Actions Taken?:

## 2018-03-18 NOTE — H&P (Signed)
History and Physical  [name] IEP:[id number] DOB: 05/17/1925 DOA: 03/18/2018   PCP: Lemmie Evens, MD   Patient coming from: Home  Chief Complaint: Mechanical fall, wheezing  HPI:  Nancy Medina is a 84 y.o. female with medical history of hypertension, diabetes mellitus, CKD, stroke, hyperlipidemia, presenting after mechanical fall at [facility].  The patient is a poor historian.  This history is supplemented by the patient's daughter at the bedside.  Apparently, the patient has had coughing and wheezing and chest congestion for the past 3 to 4 days prior to this admission.  Associated with this, the patient has had some worsening generalized weakness.  The patient went to the emergency department at [facility] on 03/17/2018.  She was discharged from the emergency department in stable condition with a prescription for azithromycin and albuterol.  On the morning of 03/10/2018, the patient was going back to her bedroom and had a mechanical fall hitting her face.  The patient denies any fevers, chills, nausea, vomiting, diarrhea, chest pain.  She denies any shortness of breath.  After the mechanical fall, life alert was activated.  The patient denied any syncope. In the emergency department, the patient was afebrile hemodynamically stable.  She was initially noted to be hypoxic with oxygen saturation in the low 80s.  I asked her saturation improved into the upper 90s with 2 L nasal cannula.  BMP showed a serum glucose of 519, bicarbonate 23, anion gap 14.  CBC was unremarkable with her hemoglobin at baseline.  Troponin was negative.  Chest x-ray was negative for acute findings.  EKG shows sinus rhythm with nonspecific T wave changes.  The patient was admitted because of hypoxia and hyperglycemia.  Notably, the patient was recently discharged from the hospital from 02/09/18 to 02/11/18 during which she was treated for AKI and dehydration.  Her ARB and furosemide were discontinued at that time.  She  has followed up with her primary care provider and her furosemide 20 mg daily was restarted approximately 2 weeks prior to this admission.  Assessment/Plan: Nonketotic hyperosmolar state -Patient presented with serum glucose 519 -Patient is not aware of pre-existing diagnosis of diabetes mellitus, but it is listed in her medical record -Check hemoglobin A1c -Start IV insulin and IV fluids -Serial BMP every 4 hours -suspect she may have been given IV or IM steroid at [facility] on 03/17/18  Acute respiratory failure with hypoxia -Suspect underlying respiratory infection -Chest x-ray negative for infiltrates or edema -Viral respiratory panel -Start Pulmicort -Start duo nebs every 6 hours -stable on 2L  Lower extremity edema -Venous duplex rule out DVT -Echocardiogram -Urine protein creatinine ratio  CKD stage IV -Baseline creatinine 1.7-2.0 -A.m. BMP  Essential hypertension -The patient is on 2 calcium channel blockers -Continue amlodipine, discontinue diltiazem  Generalized weakness -Likely secondary to respiratory infection and hyperosmolar state -UA and urine culture -TSH -Serum B12 -PT evaluation  Facial contusion/periorbital contusion -CT brain negative for acute findings -CT cervical spine negative for fracture or dislocation  Past Medical History:  Diagnosis Date  . Achilles tendinitis   . Allergic rhinitis   . Anemia of chronic disease    at least back to 2010  . Anxiety   . Benign paroxysmal positional vertigo   . Cataract   . Cerebrovascular accident ([place])   . Chronic kidney disease (CKD), stage III (moderate) (HCC)   . Depression   . DJD (degenerative joint disease)   . DM (diabetes mellitus) ([place])  type II  . GERD (gastroesophageal reflux disease)   . HTN (hypertension)   . HX: breast cancer   . Hyperlipidemia   . Hypertonicity of bladder   . Knee pain, left   . Leg pain   . Lymphocytic colitis 2008   treated with L[place]da short-term  . Obesity     . Osteoporosis, unspecified   . Renal disease    stage III  . Unspecified fall   . Unspecified glaucoma(365.9)    Past Surgical History:  Procedure Laterality Date  . Bladder tack  2000  . BREAST LUMPECTOMY  2004   right  . cataracts    . CHOLECYSTECTOMY  1960  . COLONOSCOPY  07/2007   lymphocytic colitis  . ESOPHAGOGASTRODUODENOSCOPY  06/24/2011   Procedure: ESOPHAGOGASTRODUODENOSCOPY (EGD);  Surgeon: Dorothyann Peng, MD;  Location: [facility];  Service: Endoscopy;  Laterality: N/A;.  Peptic stricture, mild erosive reflux esophagitis, hiatal  . FLEXIBLE SIGMOIDOSCOPY  11/25/2011   MODERATE DIVERTICULOSIS/INTERNAL HEMORRHOIDS  . S/P Hysterectomy  2000   benign reasons  . SAVORY DILATION  06/24/2011   Procedure: SAVORY DILATION;  Surgeon: Dorothyann Peng, MD;  Location: [facility];  Service: Endoscopy;  Laterality: N/A;   Social History:  reports that she has quit smoking. Her smoking use included cigarettes. She smoked 1.00 pack per day. She has never used smokeless tobacco. She reports that she does not drink alcohol or use drugs.   Family History  Problem Relation Age of Onset  . Colon cancer Neg Hx           . Liver disease Neg Hx           . GI problems Neg Hx              Allergies  Allergen Reactions  . Metformin     REACTION: Felt poor     Prior to Admission medications   Medication Sig Start Date End Date Taking? Authorizing Provider  acetaminophen (TYLENOL) 500 MG tablet Take 500 mg by mouth every 6 (six) hours as needed for mild pain.    [provider]  amLODipine (NORVASC) 10 MG tablet Take 10 mg by mouth daily.      [provider]  aspirin 81 MG tablet Take 81 mg by mouth daily.      [provider]  azithromycin (ZITHROMAX) 500 MG tablet Take 1 tablet by mouth daily. 03/17/18   [provider]  cholecalciferol (VITAMIN D) 1000 units tablet Take 2,000 Units by mouth daily.    [provider]  Cholecalciferol  (VITAMIN D-1000 MAX ST) 1000 UNITS tablet Take 5,000 Units by mouth daily.     [provider]  clonazePAM (KLONOPIN) 1 MG tablet Take 1 mg by mouth at bedtime. Take 1 tablet by mouth at bedtime for sleep.    [provider]  Cyanocobalamin (VITAMIN B-12 PO) Take 1,500 mg by mouth.      [provider]  diltiazem (CARDIZEM) 120 MG tablet Take 120 mg by mouth 2 (two) times daily.    [provider]  ezetimibe (ZETIA) 10 MG tablet Take 1 tablet by mouth daily. 01/24/18   [provider]  fish oil-omega-3 fatty acids 1000 MG capsule Take 2 g by mouth daily.      [provider]  furosemide (LASIX) 20 MG tablet Take 1 tablet by mouth daily. 03/07/18   [provider]  latanoprost (XALATAN) 0.005 % ophthalmic solution  06/02/11  [provider]  loperamide (IMODIUM A-D) 2 MG tablet Take 2 mg by mouth 4 (four) times daily as needed for diarrhea or loose stools.    [provider]  meclizine (ANTIVERT) 25 MG tablet Take 25 mg by mouth 4 (four) times daily as needed for dizziness.    [provider]  omeprazole (PRILOSEC) 40 MG capsule Take 1 capsule by mouth daily. 01/23/18   [provider]  PROAIR HFA 108 (90 Base) MCG/ACT inhaler Take 1-2 puffs by mouth every 6 (six) hours as needed. 03/17/18   [provider]  timolol (TIMOPTIC) 0.5 % ophthalmic solution Place 1 drop into both eyes 2 (two) times daily.  01/17/18   [provider]    Review of Systems:  Constitutional:  No weight loss, night sweats, Fevers, chills, fatigue.  Head&Eyes: No headache.  No vision loss.  No eye pain or scotoma ENT:  No Difficulty swallowing,Tooth/dental problems,Sore throat,  No ear ache, post nasal drip,  Cardio-vascular:  No chest pain, Orthopnea, PND,  dizziness, palpitations  GI:  No  abdominal pain, nausea, vomiting, diarrhea, loss of appetite, hematochezia, melena, heartburn, indigestion, Resp:  No  shortness of breath with exertion or at rest. No coughing up of blood No chest wall deformity  Skin:  no rash or lesions.  GU:  no dysuria, change in color of urine, no urgency or frequency. No flank pain.  Musculoskeletal:  No joint pain or swelling. No decreased range of motion. No back pain.  Psych:  No change in mood or affect. No depression or anxiety. Neurologic: No headache, no dysesthesia, no focal weakness, no vision loss. No syncope  Physical Exam: Vitals:   03/18/18 0902 03/18/18 0930 03/18/18 1000 03/18/18 1030  BP: 136/66 136/70 125/66 (!) 125/56  Pulse: 78 77 72 73  Resp: 16   17  Temp: (!) 97.5 F (36.4 C)     TempSrc: Oral     SpO2: 99% 99% (!) 82% 100%  Weight:      Height:       General:  A&O x 3, NAD, nontoxic, pleasant/cooperative Head/Eye: No conjunctival hemorrhage, no icterus, [place]/AT, No nystagmus ENT:  No icterus,  No thrush, good dentition, no pharyngeal exudate Neck:  No masses, no lymphadenpathy, no bruits CV:  RRR, no rub, no gallop, no S3 Lung: Bilateral expiratory wheeze; bibasilar rales.  Good air movement. Abdomen: soft/NT, +BS, nondistended, no peritoneal signs Ext: No cyanosis, No rashes, No petechiae, No lymphangitis, 2 + LE edema Neuro: CNII-XII intact, strength 4/5 in bilateral upper and lower extremities, no dysmetria  Labs on Admission:  Basic Metabolic Panel: Recent Labs  Lab 03/18/18 1037  NA 140  K 3.6  CL 103  CO2 23  GLUCOSE 519*  BUN 35*  CREATININE 2.08*  CALCIUM 8.7*   Liver Function Tests: No results for input(s): AST, ALT, ALKPHOS, BILITOT, PROT, ALBUMIN in the last 168 hours. No results for input(s): LIPASE, AMYLASE in the last 168 hours. No results for input(s): AMMONIA in the last 168 hours. CBC: Recent Labs  Lab 03/18/18 1037  WBC 5.7  HGB 8.9*  HCT 27.8*  MCV 89.4  PLT 228   Coagulation Profile: No results for input(s): INR, PROTIME in the last 168 hours. Cardiac Enzymes: Recent Labs  Lab  03/18/18 1037  TROPONINI <0.03   BNP: Invalid input(s): POCBNP CBG: No results for input(s): GLUCAP in the last 168 hours. Urine analysis:    Component Value Date/Time   COLORURINE YELLOW  03/02/2018 2130   APPEARANCEUR CLEAR 03/02/2018 2130   LABSPEC 1.016 03/02/2018 2130   PHURINE 5.0 03/02/2018 2130   GLUCOSEU NEGATIVE 03/02/2018 2130   HGBUR NEGATIVE 03/02/2018 2130   HGBUR negative 06/04/2008 0811   BILIRUBINUR NEGATIVE 03/02/2018 2130   KETONESUR NEGATIVE 03/02/2018 2130   PROTEINUR 30 (A) 03/02/2018 2130   UROBILINOGEN 0.2 06/04/2008 0811   NITRITE NEGATIVE 03/02/2018 2130   LEUKOCYTESUR NEGATIVE 03/02/2018 2130   Sepsis Labs: @LABRCNTIP (procalcitonin:4,lacticidven:4) )No results found for this or any previous visit (from the past 240 hour(s)).   Radiological Exams on Admission: Dg Chest 2 View  Result Date: 03/18/2018 CLINICAL DATA:  Bruising and swelling.  Small laceration. EXAM: CHEST - 2 VIEW COMPARISON:  March 02, 2018 FINDINGS: Mild cardiomegaly. The hila, mediastinum, lungs, and pleura are otherwise unremarkable. IMPRESSION: No active cardiopulmonary disease. Electronically Signed   By: Dorise Bullion III M.D   On: 03/18/2018 10:46   Ct Head Wo Contrast  Result Date: 03/18/2018 CLINICAL DATA:  Pain after fall. EXAM: CT HEAD WITHOUT CONTRAST CT CERVICAL SPINE WITHOUT CONTRAST TECHNIQUE: Multidetector CT imaging of the head and cervical spine was performed following the standard protocol without intravenous contrast. Multiplanar CT image reconstructions of the cervical spine were also generated. COMPARISON:  January 02, 2011 FINDINGS: CT HEAD FINDINGS Brain: No subdural, epidural, or subarachnoid hemorrhage. Ventricles and sulci are stable. A left occipital/temporal infarct is stable. Lacunar infarcts in the cerebellum are stable. The brainstem and basal cisterns are normal. White matter changes persist. No midline shift. Vascular: Calcified atherosclerosis is seen in  the intracranial carotids. Skull: Normal. Negative for fracture or focal lesion. Sinuses/Orbits: No acute finding. Other: Soft tissue swelling over the left forehead and periorbital region. The underlying globe is intact. Extracranial soft tissues are otherwise normal. CT CERVICAL SPINE FINDINGS Alignment: There is minimal anterolisthesis of C2 versus C3 measuring approximately 2 mm. No other malalignment. No fractures are seen. The pre odontoid space is normal. Skull base and vertebrae: No acute fracture. No primary bone lesion or focal pathologic process. Soft tissues and spinal canal: No prevertebral fluid or swelling. No visible canal hematoma. Disc levels: Multilevel degenerative disc disease with small anterior and posterior osteophytes. Facet degenerative changes. Upper chest: Negative. Other: No other abnormalities. IMPRESSION: 1. No acute intracranial abnormality. Chronic left occipital/temporal infarct, white matter changes, and cerebellar lacunar infarcts. 2. No fracture. No traumatic malalignment. Minimal anterolisthesis of C2 versus C3 is likely due to facet degenerative changes at this level. No fractures identified at this level. Electronically Signed   By: Dorise Bullion III M.D   On: 03/18/2018 10:34   Ct Cervical Spine Wo Contrast  Result Date: 03/18/2018 CLINICAL DATA:  Pain after fall. EXAM: CT HEAD WITHOUT CONTRAST CT CERVICAL SPINE WITHOUT CONTRAST TECHNIQUE: Multidetector CT imaging of the head and cervical spine was performed following the standard protocol without intravenous contrast. Multiplanar CT image reconstructions of the cervical spine were also generated. COMPARISON:  January 02, 2011 FINDINGS: CT HEAD FINDINGS Brain: No subdural, epidural, or subarachnoid hemorrhage. Ventricles and sulci are stable. A left occipital/temporal infarct is stable. Lacunar infarcts in the cerebellum are stable. The brainstem and basal cisterns are normal. White matter changes persist. No midline  shift. Vascular: Calcified atherosclerosis is seen in the intracranial carotids. Skull: Normal. Negative for fracture or focal lesion. Sinuses/Orbits: No acute finding. Other: Soft tissue swelling over the left forehead and periorbital region. The underlying globe is intact. Extracranial soft tissues are otherwise normal. CT CERVICAL SPINE  FINDINGS Alignment: There is minimal anterolisthesis of C2 versus C3 measuring approximately 2 mm. No other malalignment. No fractures are seen. The pre odontoid space is normal. Skull base and vertebrae: No acute fracture. No primary bone lesion or focal pathologic process. Soft tissues and spinal canal: No prevertebral fluid or swelling. No visible canal hematoma. Disc levels: Multilevel degenerative disc disease with small anterior and posterior osteophytes. Facet degenerative changes. Upper chest: Negative. Other: No other abnormalities. IMPRESSION: 1. No acute intracranial abnormality. Chronic left occipital/temporal infarct, white matter changes, and cerebellar lacunar infarcts. 2. No fracture. No traumatic malalignment. Minimal anterolisthesis of C2 versus C3 is likely due to facet degenerative changes at this level. No fractures identified at this level. Electronically Signed   By: Dorise Bullion III M.D   On: 03/18/2018 10:34   Dg Foot 2 Views Left  Result Date: 03/18/2018 CLINICAL DATA:  Pain after fall EXAM: LEFT FOOT - 2 VIEW COMPARISON:  None. FINDINGS: Deformity of the proximal right fourth metacarpal is stable consistent with remote trauma. No acute fractures are seen. IMPRESSION: No acute fracture identified. Electronically Signed   By: Dorise Bullion III M.D   On: 03/18/2018 10:46    EKG: Independently reviewed.  Sinus rhythm, nonspecific T wave inversions.    Time spent:60 minutes Code Status:   DNR Family Communication:  Daughter updated at bedside Disposition Plan: expect 2-3 day hospitalization Consults called: none  DVT Prophylaxis: [place]  Heparin     Orson Eva, DO  [facility] Pager [phone]  If 7PM-7AM, please contact night-coverage [email or web address] Password [email or web address] 03/18/2018, 12:27 PM

## 2018-03-19 ENCOUNTER — Inpatient Hospital Stay (HOSPITAL_COMMUNITY): Payer: PPO

## 2018-03-19 LAB — URINALYSIS, COMPLETE (UACMP) WITH MICROSCOPIC
BILIRUBIN URINE: NEGATIVE
Glucose, UA: 150 mg/dL — AB
Hgb urine dipstick: NEGATIVE
KETONES UR: NEGATIVE mg/dL
Leukocytes, UA: NEGATIVE
NITRITE: NEGATIVE
Protein, ur: NEGATIVE mg/dL
Specific Gravity, Urine: 1.01 (ref 1.005–1.030)
pH: 5 (ref 5.0–8.0)

## 2018-03-19 LAB — RESPIRATORY PANEL BY PCR
ADENOVIRUS-RVPPCR: NOT DETECTED
Bordetella pertussis: NOT DETECTED
CHLAMYDOPHILA PNEUMONIAE-RVPPCR: NOT DETECTED
CORONAVIRUS HKU1-RVPPCR: NOT DETECTED
CORONAVIRUS NL63-RVPPCR: NOT DETECTED
CORONAVIRUS OC43-RVPPCR: NOT DETECTED
Coronavirus 229E: NOT DETECTED
Influenza A: NOT DETECTED
Influenza B: NOT DETECTED
Metapneumovirus: NOT DETECTED
Mycoplasma pneumoniae: NOT DETECTED
PARAINFLUENZA VIRUS 1-RVPPCR: NOT DETECTED
PARAINFLUENZA VIRUS 2-RVPPCR: NOT DETECTED
PARAINFLUENZA VIRUS 3-RVPPCR: NOT DETECTED
PARAINFLUENZA VIRUS 4-RVPPCR: NOT DETECTED
RHINOVIRUS / ENTEROVIRUS - RVPPCR: DETECTED — AB
Respiratory Syncytial Virus: NOT DETECTED

## 2018-03-19 LAB — HEPATIC FUNCTION PANEL
ALBUMIN: 3.2 g/dL — AB (ref 3.5–5.0)
ALK PHOS: 66 U/L (ref 38–126)
ALT: 15 U/L (ref 14–54)
AST: 25 U/L (ref 15–41)
BILIRUBIN TOTAL: 0.5 mg/dL (ref 0.3–1.2)
Bilirubin, Direct: 0.1 mg/dL (ref 0.1–0.5)
Indirect Bilirubin: 0.4 mg/dL (ref 0.3–0.9)
TOTAL PROTEIN: 6.1 g/dL — AB (ref 6.5–8.1)

## 2018-03-19 LAB — PROTEIN / CREATININE RATIO, URINE
CREATININE, URINE: 49.58 mg/dL
PROTEIN CREATININE RATIO: 0.24 mg/mg{creat} — AB (ref 0.00–0.15)
TOTAL PROTEIN, URINE: 12 mg/dL

## 2018-03-19 LAB — GLUCOSE, CAPILLARY
GLUCOSE-CAPILLARY: 146 mg/dL — AB (ref 65–99)
Glucose-Capillary: 363 mg/dL — ABNORMAL HIGH (ref 65–99)
Glucose-Capillary: 110 mg/dL — ABNORMAL HIGH (ref 65–99)
Glucose-Capillary: 70 mg/dL (ref 65–99)
Glucose-Capillary: 242 mg/dL — ABNORMAL HIGH (ref 65–99)
Glucose-Capillary: 290 mg/dL — ABNORMAL HIGH (ref 65–99)

## 2018-03-19 LAB — BASIC METABOLIC PANEL
Anion gap: 13 (ref 5–15)
BUN: 35 mg/dL — AB (ref 6–20)
CALCIUM: 8.7 mg/dL — AB (ref 8.9–10.3)
CHLORIDE: 104 mmol/L (ref 101–111)
CO2: 23 mmol/L (ref 22–32)
Creatinine, Ser: 1.85 mg/dL — ABNORMAL HIGH (ref 0.44–1.00)
GFR calc Af Amer: 27 mL/min — ABNORMAL LOW (ref >60)
GFR calc non Af Amer: 23 mL/min — ABNORMAL LOW (ref >60)
GLUCOSE: 117 mg/dL — AB (ref 65–99)
Potassium: 3.1 mmol/L — ABNORMAL LOW (ref 3.5–5.1)
Sodium: 140 mmol/L (ref 135–145)

## 2018-03-19 LAB — HEMOGLOBIN A1C
Hgb A1c MFr Bld: 7.6 % — ABNORMAL HIGH (ref 4.8–5.6)
MEAN PLASMA GLUCOSE: 171.42 mg/dL

## 2018-03-19 LAB — VITAMIN B12: Vitamin B-12: 5223 pg/mL — ABNORMAL HIGH (ref 180–914)

## 2018-03-19 MED ORDER — FUROSEMIDE 20 MG PO TABS
20.0000 mg | ORAL_TABLET | Freq: Every day | ORAL | Status: DC
  Administered 2018-03-19 – 2018-03-20 (×2): 20 mg via ORAL
  Filled 2018-03-19 (×2): qty 1

## 2018-03-19 MED ORDER — POTASSIUM CHLORIDE CRYS ER 20 MEQ PO TBCR
40.0000 meq | EXTENDED_RELEASE_TABLET | Freq: Four times a day (QID) | ORAL | Status: AC
  Administered 2018-03-19 (×2): 40 meq via ORAL
  Filled 2018-03-19 (×2): qty 2

## 2018-03-19 MED ORDER — METHYLPREDNISOLONE SODIUM SUCC 125 MG IJ SOLR
60.0000 mg | Freq: Two times a day (BID) | INTRAMUSCULAR | Status: AC
  Administered 2018-03-19: 60 mg via INTRAVENOUS
  Filled 2018-03-19: qty 2

## 2018-03-19 MED ORDER — PREDNISONE 20 MG PO TABS
60.0000 mg | ORAL_TABLET | Freq: Every day | ORAL | Status: DC
  Administered 2018-03-20: 60 mg via ORAL
  Filled 2018-03-19: qty 3

## 2018-03-19 MED ORDER — INSULIN ASPART 100 UNIT/ML ~~LOC~~ SOLN
0.0000 [IU] | Freq: Every day | SUBCUTANEOUS | Status: DC
  Administered 2018-03-19: 3 [IU] via SUBCUTANEOUS

## 2018-03-19 MED ORDER — INSULIN ASPART 100 UNIT/ML ~~LOC~~ SOLN
0.0000 [IU] | Freq: Three times a day (TID) | SUBCUTANEOUS | Status: DC
  Administered 2018-03-19: 3 [IU] via SUBCUTANEOUS
  Administered 2018-03-19: 1 [IU] via SUBCUTANEOUS
  Administered 2018-03-20 (×2): 7 [IU] via SUBCUTANEOUS

## 2018-03-19 NOTE — Care Management Note (Addendum)
Case Management Note  Patient Details  Name: SUZETTE FLAGLER MRN: [id number] Date of Birth: 1925-10-30  Subjective/Objective: Adm with nonketotic hyperosmolar state. From [place]. Recommended for [facility] PT. Will notify Judson Roch of [facility] health when orders are available.                 Action/Plan: DC back to [place] with home health PT. Following for ongoing needs.   ADDENDUM :1505: Spoke with Sarah of [facility], patient is already receiving RN and PT home health services. Will place resumption order.  Expected Discharge Date:   03/20/2018               Expected Discharge Plan:  Assisted Living / Rest Home  In-House Referral:  Clinical Social Work  Discharge planning Services  CM Consult  Post Acute Care Choice:  Home Health Choice offered to:  Patient  DME Arranged:    DME Agency:     HH Arranged:  PT HH Agency:  [facility]  Status of Service:  Completed, signed off  If discussed at [employer] of Stay Meetings, dates discussed:    Additional Comments:  Tjuana Vickrey, [name], RN 03/19/2018, 2:33 PM

## 2018-03-19 NOTE — Progress Notes (Signed)
PROGRESS NOTE  Nancy Medina HGD:[id number] DOB: Jan 13, 1925 DOA: 03/18/2018 PCP: Lemmie Evens, MD  Brief History:  84 y.o. female with medical history of hypertension, diabetes mellitus, CKD, stroke, hyperlipidemia, presenting after mechanical fall at [facility].  The patient is a poor historian.  This history is supplemented by the patient's daughter at the bedside.  Apparently, the patient has had coughing and wheezing and chest congestion for the past 3 to 4 days prior to this admission.  Associated with this, the patient has had some worsening generalized weakness.  The patient went to the emergency department at [facility] on 03/17/2018.  She was discharged from the emergency department in stable condition with a prescription for azithromycin and albuterol.  On the morning of 03/10/2018, the patient was going back to her bedroom and had a mechanical fall hitting her face.  The patient denies any fevers, chills, nausea, vomiting, diarrhea, chest pain.  She denies any shortness of breath.  After the mechanical fall, life alert was activated.  The patient denied any syncope. In the emergency department, the patient was afebrile hemodynamically stable.  She was initially noted to be hypoxic with oxygen saturation in the low 80s.  I asked her saturation improved into the upper 90s with 2 L nasal cannula.  BMP showed a serum glucose of 519, bicarbonate 23, anion gap 14.  CBC was unremarkable with her hemoglobin at baseline.  Troponin was negative.  Chest x-ray was negative for acute findings.  EKG shows sinus rhythm with nonspecific T wave changes.  The patient was admitted because of hypoxia and hyperglycemia.    Assessment/Plan: Nonketotic hyperosmolar state -Patient presented with serum glucose 519 -Patient is not aware of pre-existing diagnosis of diabetes mellitus, but it is listed in her medical record -Check hemoglobin A1c -Start IV insulin and IV fluids-->transition to [place]  insulin -suspect she may have been given IV or IM steroid at [facility] on 03/17/18  Acute respiratory failure with hypoxia -Suspect underlying respiratory infection -Chest x-ray negative for infiltrates or edema -Viral respiratory panel--pending -Continue Pulmicort -Continue nebs -stable on 2L>>>now on RA -continue IV solumedrol  Lower extremity edema -Venous duplex rule out DVT -Echocardiogram -Urine protein creatinine ratio--0.24 -restart home lasix  CKD stage IV -Baseline creatinine 1.7-2.0 -A.m. BMP  Essential hypertension -The patient is on 2 calcium channel blockers -ultimately plan to d/c diltiazem -holding amlodipine due to soft BP  Generalized weakness -Likely secondary to respiratory infection and hyperosmolar state -UA--neg pyuria -TSH--1.422 -Serum B12 -PT evaluation  Facial contusion/periorbital contusion -CT brain negative for acute findings -CT cervical spine negative for fracture or dislocation  Cognitive impairment -likely has underlying dementia -outpt neurology eval   Disposition Plan:   [place] 4/30 if stable Family Communication:  No Family at bedside  Consultants:  none  Code Status:  FULL  DVT Prophylaxis:   Heparin    Procedures: As Listed in Progress Note Above  Antibiotics: None    Subjective: Patient continues to complain of a nonproductive cough.  She denies any chest pain, shortness breath, nausea, vomiting, diarrhea, abdominal pain, dysuria, hematuria.  She denies any headache or neck pain.  Objective: Vitals:   03/19/18 0700 03/19/18 0747 03/19/18 0752 03/19/18 0800  BP: (!) 106/44   125/61  Pulse: 63   78  Resp: 12   16  Temp:      TempSrc:      SpO2: 91% 91% 91% 93%  Weight:  Height:        Intake/Output Summary (Last 24 hours) at 03/19/2018 0926 Last data filed at 03/19/2018 0700 Gross per 24 hour  Intake 300 ml  Output 750 ml  Net -450 ml   Weight change:  Exam:   General:  Pt is alert,  follows commands appropriately, not in acute distress  HEENT: No icterus, No thrush, No neck mass, [place]/AT  Cardiovascular: RRR, S1/S2, no rubs, no gallops  Respiratory: bibasilar crackles.  Mild bibasilar wheeze.  Good air movement.  Abdomen: Soft/+BS, non tender, non distended, no guarding  Extremities: 1 + LE edema, No lymphangitis, No petechiae, No rashes, no synovitis   Data Reviewed: I have personally reviewed following labs and imaging studies Basic Metabolic Panel: Recent Labs  Lab 03/18/18 1037 03/18/18 2131 03/19/18 0420  NA 140 139 140  K 3.6 3.3* 3.1*  CL 103 102 104  CO2 23 24 23   GLUCOSE 519* 455* 117*  BUN 35* 36* 35*  CREATININE 2.08* 1.95* 1.85*  CALCIUM 8.7* 8.8* 8.7*   Liver Function Tests: Recent Labs  Lab 03/19/18 0849  AST 25  ALT 15  ALKPHOS 66  BILITOT 0.5  PROT 6.1*  ALBUMIN 3.2*   No results for input(s): LIPASE, AMYLASE in the last 168 hours. No results for input(s): AMMONIA in the last 168 hours. Coagulation Profile: No results for input(s): INR, PROTIME in the last 168 hours. CBC: Recent Labs  Lab 03/18/18 1037  WBC 5.7  HGB 8.9*  HCT 27.8*  MCV 89.4  PLT 228   Cardiac Enzymes: Recent Labs  Lab 03/18/18 1037  TROPONINI <0.03   BNP: Invalid input(s): POCBNP CBG: Recent Labs  Lab 03/18/18 2118 03/18/18 2245 03/19/18 0101 03/19/18 0426 03/19/18 0803  GLUCAP 431* 442* 363* 110* 70   HbA1C: No results for input(s): HGBA1C in the last 72 hours. Urine analysis:    Component Value Date/Time   COLORURINE STRAW (A) 03/19/2018 0448   APPEARANCEUR CLEAR 03/19/2018 0448   LABSPEC 1.010 03/19/2018 0448   PHURINE 5.0 03/19/2018 0448   GLUCOSEU 150 (A) 03/19/2018 0448   HGBUR NEGATIVE 03/19/2018 0448   HGBUR negative 06/04/2008 0811   BILIRUBINUR NEGATIVE 03/19/2018 0448   KETONESUR NEGATIVE 03/19/2018 0448   PROTEINUR NEGATIVE 03/19/2018 0448   UROBILINOGEN 0.2 06/04/2008 0811   NITRITE NEGATIVE 03/19/2018 0448    LEUKOCYTESUR NEGATIVE 03/19/2018 0448   Sepsis Labs: @LABRCNTIP (procalcitonin:4,lacticidven:4) ) Recent Results (from the past 240 hour(s))  MRSA PCR Screening     Status: None   Collection Time: 03/18/18  9:26 PM  Result Value Ref Range Status   MRSA by PCR NEGATIVE NEGATIVE Final    Comment:        The GeneXpert MRSA Assay (FDA approved for NASAL specimens only), is one component of a comprehensive MRSA colonization surveillance program. It is not intended to diagnose MRSA infection nor to guide or monitor treatment for MRSA infections. Performed at [facility], [address], [place],  [address]      Scheduled Meds: . aspirin  81 mg Oral Daily  . budesonide (PULMICORT) nebulizer solution  0.5 mg Nebulization BID  . cholecalciferol  5,000 Units Oral Daily  . clonazePAM  1 mg Oral QHS  . ezetimibe  10 mg Oral QHS  . heparin  5,000 Units Subcutaneous Q8H  . insulin aspart  0-5 Units Subcutaneous QHS  . insulin aspart  0-9 Units Subcutaneous TID WC  . ipratropium-albuterol  3 mL Nebulization TID  . methylPREDNISolone (SOLU-MEDROL)  injection  60 mg Intravenous Q12H  . omega-3 acid ethyl esters  2 g Oral Daily  . pantoprazole  80 mg Oral Daily  . timolol  1 drop Both Eyes BID  . vitamin B-12  1,000 mcg Oral Daily   Continuous Infusions:  Procedures/Studies: Dg Chest 2 View  Result Date: 03/18/2018 CLINICAL DATA:  Bruising and swelling.  Small laceration. EXAM: CHEST - 2 VIEW COMPARISON:  March 02, 2018 FINDINGS: Mild cardiomegaly. The hila, mediastinum, lungs, and pleura are otherwise unremarkable. IMPRESSION: No active cardiopulmonary disease. Electronically Signed   By: Dorise Bullion III M.D   On: 03/18/2018 10:46   Dg Chest 2 View  Result Date: 03/02/2018 CLINICAL DATA:  Altered level of consciousness. EXAM: CHEST - 2 VIEW COMPARISON:  02/09/1989 FINDINGS: Stable cardiomegaly with aortic atherosclerosis. There is uncoiling of the thoracic aorta as before  without definite aneurysmal dilatation. Mild vascular congestion without acute pulmonary consolidation, effusion or pneumothorax. No acute osseous abnormality. Degenerative changes are present along the dorsal spine. IMPRESSION: Cardiomegaly with aortic atherosclerosis.  Mild vascular congestion. Electronically Signed   By: Ashley Royalty M.D.   On: 03/02/2018 22:18   Ct Head Wo Contrast  Result Date: 03/18/2018 CLINICAL DATA:  Pain after fall. EXAM: CT HEAD WITHOUT CONTRAST CT CERVICAL SPINE WITHOUT CONTRAST TECHNIQUE: Multidetector CT imaging of the head and cervical spine was performed following the standard protocol without intravenous contrast. Multiplanar CT image reconstructions of the cervical spine were also generated. COMPARISON:  January 02, 2011 FINDINGS: CT HEAD FINDINGS Brain: No subdural, epidural, or subarachnoid hemorrhage. Ventricles and sulci are stable. A left occipital/temporal infarct is stable. Lacunar infarcts in the cerebellum are stable. The brainstem and basal cisterns are normal. White matter changes persist. No midline shift. Vascular: Calcified atherosclerosis is seen in the intracranial carotids. Skull: Normal. Negative for fracture or focal lesion. Sinuses/Orbits: No acute finding. Other: Soft tissue swelling over the left forehead and periorbital region. The underlying globe is intact. Extracranial soft tissues are otherwise normal. CT CERVICAL SPINE FINDINGS Alignment: There is minimal anterolisthesis of C2 versus C3 measuring approximately 2 mm. No other malalignment. No fractures are seen. The pre odontoid space is normal. Skull base and vertebrae: No acute fracture. No primary bone lesion or focal pathologic process. Soft tissues and spinal canal: No prevertebral fluid or swelling. No visible canal hematoma. Disc levels: Multilevel degenerative disc disease with small anterior and posterior osteophytes. Facet degenerative changes. Upper chest: Negative. Other: No other  abnormalities. IMPRESSION: 1. No acute intracranial abnormality. Chronic left occipital/temporal infarct, white matter changes, and cerebellar lacunar infarcts. 2. No fracture. No traumatic malalignment. Minimal anterolisthesis of C2 versus C3 is likely due to facet degenerative changes at this level. No fractures identified at this level. Electronically Signed   By: Dorise Bullion III M.D   On: 03/18/2018 10:34   Ct Head Wo Contrast  Result Date: 03/02/2018 CLINICAL DATA:  Altered level of consciousness EXAM: CT HEAD WITHOUT CONTRAST TECHNIQUE: Contiguous axial images were obtained from the base of the skull through the vertex without intravenous contrast. COMPARISON:  01/02/2011 FINDINGS: BRAIN: There is sulcal and ventricular prominence consistent with superficial and central atrophy. No intraparenchymal hemorrhage, mass effect nor midline shift. Periventricular and subcortical white matter hypodensities consistent with chronic small vessel ischemic disease are identified. No acute large vascular territory infarcts. Encephalomalacia of the left posterior parietooccipital lobe from remote infarct. Small right-sided basal ganglial lacunar infarcts. Idiopathic bilateral basal ganglial calcifications. No abnormal extra-axial fluid collections. Basal  cisterns are not effaced and midline. VASCULAR: Moderate calcific atherosclerosis of the carotid siphons. SKULL: No skull fracture. No significant scalp soft tissue swelling. SINUSES/ORBITS: The mastoid air-cells are clear. The included paranasal sinuses are well-aerated.The included ocular globes and orbital contents are non-suspicious. OTHER: None. IMPRESSION: 1. Atrophy with moderate chronic small vessel ischemia. 2. No acute intracranial abnormality. 3. Chronic left parietal-occipital infarct with encephalomalacia. Electronically Signed   By: Ashley Royalty M.D.   On: 03/02/2018 22:08   Ct Cervical Spine Wo Contrast  Result Date: 03/18/2018 CLINICAL DATA:  Pain  after fall. EXAM: CT HEAD WITHOUT CONTRAST CT CERVICAL SPINE WITHOUT CONTRAST TECHNIQUE: Multidetector CT imaging of the head and cervical spine was performed following the standard protocol without intravenous contrast. Multiplanar CT image reconstructions of the cervical spine were also generated. COMPARISON:  January 02, 2011 FINDINGS: CT HEAD FINDINGS Brain: No subdural, epidural, or subarachnoid hemorrhage. Ventricles and sulci are stable. A left occipital/temporal infarct is stable. Lacunar infarcts in the cerebellum are stable. The brainstem and basal cisterns are normal. White matter changes persist. No midline shift. Vascular: Calcified atherosclerosis is seen in the intracranial carotids. Skull: Normal. Negative for fracture or focal lesion. Sinuses/Orbits: No acute finding. Other: Soft tissue swelling over the left forehead and periorbital region. The underlying globe is intact. Extracranial soft tissues are otherwise normal. CT CERVICAL SPINE FINDINGS Alignment: There is minimal anterolisthesis of C2 versus C3 measuring approximately 2 mm. No other malalignment. No fractures are seen. The pre odontoid space is normal. Skull base and vertebrae: No acute fracture. No primary bone lesion or focal pathologic process. Soft tissues and spinal canal: No prevertebral fluid or swelling. No visible canal hematoma. Disc levels: Multilevel degenerative disc disease with small anterior and posterior osteophytes. Facet degenerative changes. Upper chest: Negative. Other: No other abnormalities. IMPRESSION: 1. No acute intracranial abnormality. Chronic left occipital/temporal infarct, white matter changes, and cerebellar lacunar infarcts. 2. No fracture. No traumatic malalignment. Minimal anterolisthesis of C2 versus C3 is likely due to facet degenerative changes at this level. No fractures identified at this level. Electronically Signed   By: Dorise Bullion III M.D   On: 03/18/2018 10:34   Dg Foot 2 Views  Left  Result Date: 03/18/2018 CLINICAL DATA:  Pain after fall EXAM: LEFT FOOT - 2 VIEW COMPARISON:  None. FINDINGS: Deformity of the proximal right fourth metacarpal is stable consistent with remote trauma. No acute fractures are seen. IMPRESSION: No acute fracture identified. Electronically Signed   By: Dorise Bullion III M.D   On: 03/18/2018 10:46    Orson Eva, DO  [facility] Pager [phone]  If 7PM-7AM, please contact night-coverage [email or web address] Password [email or web address] 03/19/2018, 9:26 AM   LOS: 1 day

## 2018-03-19 NOTE — Clinical Social Work Note (Addendum)
Clinical Social Work Assessment  Patient Details  Name: Nancy Medina MRN: [id number] Date of Birth: 1925/01/28  Date of referral:  03/19/18               Reason for consult:  Discharge Planning                Permission sought to share information with:    Permission granted to share information::     Name::        Agency::     Relationship::     Contact Information:     Housing/Transportation Living arrangements for the past 2 months:  [place] of Information:  Facility Patient Interpreter Needed:  None Criminal Activity/Legal Involvement Pertinent to Current Situation/Hospitalization:  No - Comment as needed Significant Relationships:  Adult Children Lives with:  Facility Resident Do you feel safe going back to the place where you live?  Yes Need for family participation in patient care:  No (Coment)  Care giving concerns: Pt resides at at ALF.   Social Worker assessment / plan: Pt is a 84 year old female admitted from [place]. Plan is for return to the ALF at dc. Spoke with Nancy Medina at [facility] to update. Pt has lived at [place] since September 2018. She has a supportive family. Pt alert and oriented. Pt feeds herself. She toilets independently. Pt does receive assistance with showering and dressing. Pt ambulates independently with a cane. PT is recommending HH PT at dc. Nancy Medina aware and orders can be sent to their [facility]. RN CM, Nancy Medina, aware. Either facility or daughter can transport at dc. Will follow to assist with dc planning.  Employment status:  Retired Nurse, adult PT Recommendations:  Home with [place] / Referral to community resources:     Patient/Family's Response to care: Pt accepting of care.  Patient/Family's Understanding of and Emotional Response to Diagnosis, Current Treatment, and Prognosis: Pt appears to have an understanding of diagnosis and treatment recommendations. No  emotional distress identified.  Emotional Assessment Appearance:  Appears stated age Attitude/Demeanor/Rapport:    Affect (typically observed):  Calm, Pleasant Orientation:  Oriented to Self, Oriented to Place, Oriented to Situation Alcohol / Substance use:  Not Applicable Psych involvement (Current and /or in the community):  No (Comment)  Discharge Needs  Concerns to be addressed:  Discharge Planning Concerns Readmission within the last 30 days:  No Current discharge risk:  None Barriers to Discharge:  No Barriers Identified   Nancy Flood, LCSW 03/19/2018, 3:53 PM

## 2018-03-19 NOTE — Plan of Care (Signed)
  Problem: Acute Rehab PT Goals(only PT should resolve) Goal: Pt Will Go Supine/Side To Sit Outcome: Progressing Flowsheets (Taken 03/19/2018 1410) Pt will go Supine/Side to Sit: with modified independence Goal: Patient Will Transfer Sit To/From Stand Outcome: Progressing Flowsheets (Taken 03/19/2018 1410) Patient will transfer sit to/from stand: with supervision Goal: Pt Will Transfer Bed To Chair/Chair To Bed Outcome: Progressing Flowsheets (Taken 03/19/2018 1410) Pt will Transfer Bed to Chair/Chair to Bed: with supervision Goal: Pt Will Ambulate Outcome: Progressing Flowsheets (Taken 03/19/2018 1410) Pt will Ambulate: with supervision;75 feet;with rolling walker  2:11 PM, 03/19/18 Lonell Grandchild, MPT Physical Therapist with [facility] [phone] office [phone] mobile phone

## 2018-03-19 NOTE — Progress Notes (Signed)
Notified Dr Manuella Ghazi of previous Lab results and status of drips and current CBG.  Orders received and new lab results conveyed. Orders completed.

## 2018-03-19 NOTE — Evaluation (Signed)
Physical Therapy Evaluation Patient Details Name: Nancy Medina MRN: [id number] DOB: 13-Oct-1925 Today's Date: 03/19/2018   History of Present Illness  Nancy Medina is a 84 y.o. female with medical history of hypertension, diabetes mellitus, CKD, stroke, hyperlipidemia, presenting after mechanical fall at [facility].  The patient is a poor historian.  This history is supplemented by the patient's daughter at the bedside.  Apparently, the patient has had coughing and wheezing and chest congestion for the past 3 to 4 days prior to this admission.  Associated with this, the patient has had some worsening generalized weakness.  The patient went to the emergency department at [facility] on 03/17/2018.  She was discharged from the emergency department in stable condition with a prescription for azithromycin and albuterol.  On the morning of 03/10/2018, the patient was going back to her bedroom and had a mechanical fall hitting her face.  The patient denies any fevers, chills, nausea, vomiting, diarrhea, chest pain.  She denies any shortness of breath.  After the mechanical fall, life alert was activated.  The patient denied any syncope.    Clinical Impression  Patient functioning near baseline for functional mobility and gait, limited most due to desaturation to 77% while on room air, O2 quickly returns above 90% once sitting/resting, increased fall risk due to bumping into nearby objects during gait training and tolerated sitting up in chair with family member present after therapy.  Patient will benefit from continued physical therapy in hospital and recommended venue below to increase strength, balance, endurance for safe ADLs and gait.     Follow Up Recommendations Home health PT;Supervision for mobility/OOB    Equipment Recommendations  None recommended by PT    Recommendations for Other Services       Precautions / Restrictions Precautions Precautions: Fall Restrictions Weight Bearing  Restrictions: No      Mobility  Bed Mobility Overal bed mobility: Needs Assistance Bed Mobility: Sit to Supine       Sit to supine: Supervision   General bed mobility comments: slightly labored movement  Transfers Overall transfer level: Needs assistance Equipment used: Rolling walker (2 wheeled) Transfers: Sit to/from [facility] Sit to Stand: Min guard Stand pivot transfers: Min guard       General transfer comment: required verbal cues for proper hand placement during sit to stands  Ambulation/Gait Ambulation/Gait assistance: Min guard Ambulation Distance (Feet): 35 Feet Assistive device: Rolling walker (2 wheeled) Gait Pattern/deviations: Decreased step length - right;Decreased step length - left;Decreased stride length Gait velocity: decreased   General Gait Details: slightly unsteady cadence with occasional bumping into near by objects, no loss of balance, limited secondary to O2 desaturation to 77% on room air and fatigue  Stairs            Wheelchair Mobility    Modified Rankin (Stroke Patients Only)       Balance Overall balance assessment: Needs assistance Sitting-balance support: Feet supported;No upper extremity supported Sitting balance-Leahy Scale: Good     Standing balance support: Bilateral upper extremity supported;During functional activity Standing balance-Leahy Scale: Fair                               Pertinent Vitals/Pain Pain Assessment: No/denies pain    Home Living Family/patient expects to be discharged to:: Assisted living               Home Equipment: Kasandra Knudsen - single point;Walker - 4  wheels;Walker - 2 wheels      Prior Function Level of Independence: Needs assistance   Gait / Transfers Assistance Needed: Ambulates with Rollator, sometimes with walking stick household distances  ADL's / Homemaking Assistance Needed: assisted by ALF staff        Hand Dominance         Extremity/Trunk Assessment   Upper Extremity Assessment Upper Extremity Assessment: Generalized weakness    Lower Extremity Assessment Lower Extremity Assessment: Generalized weakness    Cervical / Trunk Assessment Cervical / Trunk Assessment: Normal  Communication   Communication: No difficulties  Cognition Arousal/Alertness: Awake/alert Behavior During Therapy: WFL for tasks assessed/performed Overall Cognitive Status: Within Functional Limits for tasks assessed                                        General Comments      Exercises     Assessment/Plan    PT Assessment Patient needs continued PT services  PT Problem List Decreased strength;Decreased activity tolerance;Decreased balance;Decreased mobility       PT Treatment Interventions Gait training;Functional mobility training;Therapeutic activities;Therapeutic exercise;Patient/family education    PT Goals (Current goals can be found in the Care Plan section)  Acute Rehab PT Goals Patient Stated Goal: return home PT Goal Formulation: With patient Time For Goal Achievement: 04/15/18 Potential to Achieve Goals: Good    Frequency Min 3X/week   Barriers to discharge        Co-evaluation               AM-PAC PT "6 Clicks" Daily Activity  Outcome Measure Difficulty turning over in bed (including adjusting bedclothes, sheets and blankets)?: None Difficulty moving from lying on back to sitting on the side of the bed? : None Difficulty sitting down on and standing up from a chair with arms (e.g., wheelchair, bedside commode, etc,.)?: A Little Help needed moving to and from a bed to chair (including a wheelchair)?: A Little Help needed walking in hospital room?: A Little Help needed climbing 3-5 steps with a railing? : A Little 6 Click Score: 20    End of Session Equipment Utilized During Treatment: Gait belt Activity Tolerance: Patient tolerated treatment well;Patient limited by  fatigue(Patient limited by O2 desaturation on exertion) Patient left: in chair;with call bell/phone within reach;with family/visitor present Nurse Communication: Mobility status PT Visit Diagnosis: Other abnormalities of gait and mobility (R26.89);Unsteadiness on feet (R26.81);Muscle weakness (generalized) (M62.81)    Time: 7517-0017 PT Time Calculation (min) (ACUTE ONLY): 28 min   Charges:   PT Evaluation $PT Eval Moderate Complexity: 1 Mod PT Treatments $Therapeutic Activity: 23-37 mins   PT G Codes:        2:09 PM, Apr 15, 2018 Lonell Grandchild, MPT Physical Therapist with [facility] [phone] office [phone] mobile phone

## 2018-03-20 ENCOUNTER — Inpatient Hospital Stay (HOSPITAL_COMMUNITY): Payer: PPO

## 2018-03-20 DIAGNOSIS — I340 Nonrheumatic mitral (valve) insufficiency: Secondary | ICD-10-CM

## 2018-03-20 DIAGNOSIS — J206 Acute bronchitis due to rhinovirus: Secondary | ICD-10-CM

## 2018-03-20 LAB — GLUCOSE, CAPILLARY
GLUCOSE-CAPILLARY: 285 mg/dL — AB (ref 65–99)
GLUCOSE-CAPILLARY: 321 mg/dL — AB (ref 65–99)
Glucose-Capillary: 285 mg/dL — ABNORMAL HIGH (ref 65–99)
Glucose-Capillary: 304 mg/dL — ABNORMAL HIGH (ref 65–99)

## 2018-03-20 LAB — CBC
HCT: 29 % — ABNORMAL LOW (ref 36.0–46.0)
Hemoglobin: 9.2 g/dL — ABNORMAL LOW (ref 12.0–15.0)
MCH: 28.4 pg (ref 26.0–34.0)
MCHC: 31.7 g/dL (ref 30.0–36.0)
MCV: 89.5 fL (ref 78.0–100.0)
PLATELETS: 245 10*3/uL (ref 150–400)
RBC: 3.24 MIL/uL — ABNORMAL LOW (ref 3.87–5.11)
RDW: 14.3 % (ref 11.5–15.5)
WBC: 5.7 10*3/uL (ref 4.0–10.5)

## 2018-03-20 LAB — ECHOCARDIOGRAM COMPLETE
Height: 62 in
Weight: 2973.56 oz

## 2018-03-20 LAB — URINE CULTURE: CULTURE: NO GROWTH

## 2018-03-20 LAB — BASIC METABOLIC PANEL
Anion gap: 13 (ref 5–15)
BUN: 39 mg/dL — ABNORMAL HIGH (ref 6–20)
CALCIUM: 9 mg/dL (ref 8.9–10.3)
CO2: 24 mmol/L (ref 22–32)
CREATININE: 1.8 mg/dL — AB (ref 0.44–1.00)
Chloride: 102 mmol/L (ref 101–111)
GFR calc Af Amer: 27 mL/min — ABNORMAL LOW (ref >60)
GFR calc non Af Amer: 24 mL/min — ABNORMAL LOW (ref >60)
GLUCOSE: 321 mg/dL — AB (ref 65–99)
Potassium: 4.1 mmol/L (ref 3.5–5.1)
Sodium: 139 mmol/L (ref 135–145)

## 2018-03-20 LAB — MAGNESIUM: MAGNESIUM: 1.8 mg/dL (ref 1.7–2.4)

## 2018-03-20 MED ORDER — PREDNISONE 10 MG PO TABS: 50.0000 mg | ORAL_TABLET | Freq: Every day | ORAL | 0 refills | Status: DC

## 2018-03-20 MED ORDER — VITAMIN B-12 1000 MCG PO TABS: 1000.0000 ug | ORAL_TABLET | Freq: Every day | ORAL | 0 refills | Status: DC

## 2018-03-20 MED ORDER — SITAGLIPTIN PHOSPHATE 25 MG PO TABS: 25.0000 mg | ORAL_TABLET | Freq: Every day | ORAL | 1 refills | Status: DC

## 2018-03-20 NOTE — Progress Notes (Addendum)
Inpatient Diabetes Program Recommendations  AACE/ADA: New Consensus Statement on Inpatient Glycemic Control (2019)  Target Ranges:  Prepandial:   less than 140 mg/dL      Peak postprandial:   less than 180 mg/dL (1-2 hours)      Critically ill patients:  140 - 180 mg/dL  Results for Nancy Medina, Nancy Medina (MRN [id number]) as of 03/20/2018 07:20  Ref. Range 03/20/2018 04:59  Glucose Latest Ref Range: 65 - 99 mg/dL 321 (H)   Results for Nancy Medina, Nancy Medina (MRN [id number]) as of 03/20/2018 07:20  Ref. Range 03/19/2018 04:26 03/19/2018 08:03 03/19/2018 11:49 03/19/2018 16:33 03/19/2018 21:40 03/20/2018 00:15 03/20/2018 04:16  Glucose-Capillary Latest Ref Range: 65 - 99 mg/dL 110 (H) 70 146 (H)  Novolog 1 unit  Solumedrol 60 mg 242 (H)  Novolog 3 units 290 (H)  Novolog 3 units  Solumedrol 60 mg 285 (H) 285 (H)  Results for Nancy Medina, Nancy Medina (MRN [id number]) as of 03/20/2018 07:20  Ref. Range 03/18/2018 21:32  Hemoglobin A1C Latest Ref Range: 4.8 - 5.6 % 7.6 (H)   Review of Glycemic Control  Diabetes history: DM2 Outpatient Diabetes medications: None Current orders for Inpatient glycemic control: Novolog 0-9 units TID with meals, Novlog 0-5 units QHS; Prednisone 60 mg QAM  Inpatient Diabetes Program Recommendations:  Oral Agents:Tradjenta is is DPP-4 inhibitor which is excreted through nonrenal pathway (eliminated via feces) with very low risk of hypoglycemia.  May want to consider starting patient on Tradjenta 5 mg daily.   NOTE: In reviewing chart, noted patient has a hx of DM2 and was recently inpatient from 02/09/18 to 02/11/18 and during hospitalization patient was noted to have hypoglycemia. Patient was taking Amaryl 4 mg daily as an outpatient which was discontinued at time of discharge on 02/11/18. Patient was admitted with initial glucose 519 mg/dl on 03/18/18 and MD questions if patient was given steroids at [facility] on 03/17/18. Currently patient is receiving steroids which are contributing to  hyperglycemia but noted steroids decreased and changed to PO. Due to risk of hypoglycemia with patient's age and recent fall, would not recommend using Amaryl as outpatient. May want to consider ordering Tradjenta 5 mg daily which does not usually cause any issues with hypoglycemia.  Thanks, Barnie Alderman, RN, MSN, CDE Diabetes Coordinator Inpatient Diabetes Program [phone] (Team Pager from 8am to 5pm)

## 2018-03-20 NOTE — Discharge Summary (Signed)
Physician Discharge Summary  Nancy Medina WGY:[id number] DOB: 03-11-1925 DOA: 03/18/2018  PCP: Nancy Evens, MD  Admit date: 03/18/2018 Discharge date: 03/20/2018  Admitted From:  Nancy Medina Disposition:  [place]--  Recommendations for Outpatient Follow-up:  1. Follow up with PCP in 1-2 weeks 2. Please obtain BMP/CBC in one week   Home Health:YES Equipment/Devices:HHPT, RN  Discharge Condition: Stable CODE STATUS: DNR Diet recommendation: Heart Healthy / Carb Modified    Brief/Interim Summary: 84 y.o.femalewith medical history ofhypertension, diabetes mellitus, CKD, stroke, hyperlipidemia, presenting after mechanical fall at [facility]. The patient is a poor historian. This history is supplemented by the patient's daughter at the bedside. Apparently, the patient has had coughing and wheezing and chest congestion for the past 3 to 4 days prior to this admission. Associated with this, the patient has had some worsening generalized weakness. The patient went to the emergency department at [facility] 03/17/2018. She was discharged from the emergency department in stable condition with a prescription for azithromycin and albuterol. On the morning of 03/10/2018, the patient was going back to her bedroom and had a mechanical fall hitting her face. The patient denies any fevers, chills, nausea, vomiting, diarrhea, chest pain. She denies any shortness of breath. After the mechanical fall, life alert was activated. The patient denied any syncope. In the emergency department, the patient was afebrile hemodynamically stable. She was initially noted to be hypoxic with oxygen saturation in the low 80s. I asked her saturation improved into the upper 90s with 2 L nasal cannula. BMP showed a serum glucose of 519, bicarbonate 23, anion gap 14. CBC was unremarkable with her hemoglobin at baseline. Troponin was negative. Chest x-ray was negative for acute findings. EKG shows  sinus rhythm with nonspecific T wave changes. The patient was admitted because of hypoxia and hyperglycemia.    Discharge Diagnoses:  Nonketotic hyperosmolarstate -mild clinically as pt was at baseline mentation without N/V -Patient presented with serum glucose 519 -Patient is not aware of pre-existing diagnosis of diabetes mellitus, but it is listed in her medical record -Check hemoglobin A1c--7.6 -Start IV insulin and IV fluids-->transition to [place] insulin -suspect she may have been given IV or IM steroid at [facility] on 03/17/18  Acute respiratory failure with hypoxia -Suspect underlying respiratory infection -Chest x-ray negative for infiltrates or edema -Viral respiratory panel--pending -Continue Pulmicort -Continue nebs -stable on 2L>>>now on RA -continue IV solumedrol>>>d/c with prednisone taper  Acute Bronchitis -Viral respiratory panel-->+rhinovirus/enterovirus CXR--no edema or consolidation  Diabetes Mellitus type 2 -d/c home with sitagliptin, renally adjusted -A1C--7.6 -TSH 1.422  Lower extremity edema -Venous duplex rule out DVT--neg -Echocardiogram--results pending at time of d/c -Urine protein creatinine ratio--0.24 -restart home lasix 20 mg daily-->serum creatinine stable  CKD stage IV -Baseline creatinine 1.7-2.0 -A.m. BMP -serum creatinine 1.80 on day of d/c  Essential hypertension -The patient is on 2 calcium channel blockers -ultimately plan to d/c diltiazem -holding amlodipine -will not restart BP meds as BP remained controlled/acceptable during hospitalization  Generalized weakness -Likely secondary to respiratory infection and hyperosmolar state -UA--neg pyuria -TSH--1.422 -Serum B12 -PT evaluation-->HHPT  Facial contusion/periorbital contusion -CT brain negative for acute findings -CT cervical spine negative for fracture or dislocation  Cognitive impairment -likely has underlying dementia -outpt neurology eval      Discharge  Instructions   Allergies as of 03/20/2018      Reactions   Metformin    REACTION: Felt poor      Medication List    STOP taking these medications   amLODipine  10 MG tablet Commonly known as:  NORVASC   azithromycin 500 MG tablet Commonly known as:  ZITHROMAX   diltiazem 120 MG tablet Commonly known as:  CARDIZEM     TAKE these medications   acetaminophen 500 MG tablet Commonly known as:  TYLENOL Take 500 mg by mouth every 6 (six) hours as needed for mild pain.   aspirin 81 MG tablet Take 81 mg by mouth daily.   clonazePAM 1 MG tablet Commonly known as:  KLONOPIN Take 1 mg by mouth 3 (three) times daily as needed for anxiety. Take 1 tablet by mouth at bedtime for sleep.   ezetimibe 10 MG tablet Commonly known as:  ZETIA Take 1 tablet by mouth daily.   fish oil-omega-3 fatty acids 1000 MG capsule Take 1 g by mouth daily.   furosemide 20 MG tablet Commonly known as:  LASIX Take 1 tablet by mouth daily.   latanoprost 0.005 % ophthalmic solution Commonly known as:  XALATAN Place 1 drop into both eyes daily.   loperamide 2 MG tablet Commonly known as:  IMODIUM A-D Take 2 mg by mouth 4 (four) times daily as needed for diarrhea or loose stools.   meclizine 25 MG tablet Commonly known as:  ANTIVERT Take 25 mg by mouth 4 (four) times daily as needed for dizziness.   omeprazole 40 MG capsule Commonly known as:  PRILOSEC Take 1 capsule by mouth daily.   predniSONE 10 MG tablet Commonly known as:  DELTASONE Take 5 tablets (50 mg total) by mouth daily with breakfast. And decrease by one tablet daily Start taking on:  03/21/2018   PROAIR HFA 108 (90 Base) MCG/ACT inhaler Generic drug:  albuterol Take 1-2 puffs by mouth every 6 (six) hours as needed.   sitaGLIPtin 25 MG tablet Commonly known as:  JANUVIA Take 1 tablet (25 mg total) by mouth daily.   timolol 0.5 % ophthalmic solution Commonly known as:  TIMOPTIC Place 1 drop into both eyes 2 (two) times  daily.   vitamin B-12 1000 MCG tablet Commonly known as:  CYANOCOBALAMIN Take 1 tablet (1,000 mcg total) by mouth daily. What changed:    medication strength  how much to take   Vitamin D 2000 units Caps Take 1 capsule by mouth daily. What changed:  Another medication with the same name was removed. Continue taking this medication, and follow the directions you see here.   VITAMIN D-1000 MAX ST 1000 units tablet Generic drug:  Cholecalciferol Take 5,000 Units by mouth daily. What changed:  Another medication with the same name was removed. Continue taking this medication, and follow the directions you see here.       Allergies  Allergen Reactions  . Metformin     REACTION: Felt poor    Consultations:  none   Procedures/Studies: Dg Chest 2 View  Result Date: 03/18/2018 CLINICAL DATA:  Bruising and swelling.  Small laceration. EXAM: CHEST - 2 VIEW COMPARISON:  March 02, 2018 FINDINGS: Mild cardiomegaly. The hila, mediastinum, lungs, and pleura are otherwise unremarkable. IMPRESSION: No active cardiopulmonary disease. Electronically Signed   By: Dorise Bullion III M.D   On: 03/18/2018 10:46   Dg Chest 2 View  Result Date: 03/02/2018 CLINICAL DATA:  Altered level of consciousness. EXAM: CHEST - 2 VIEW COMPARISON:  02/09/1989 FINDINGS: Stable cardiomegaly with aortic atherosclerosis. There is uncoiling of the thoracic aorta as before without definite aneurysmal dilatation. Mild vascular congestion without acute pulmonary consolidation, effusion or pneumothorax. No acute osseous abnormality. Degenerative  changes are present along the dorsal spine. IMPRESSION: Cardiomegaly with aortic atherosclerosis.  Mild vascular congestion. Electronically Signed   By: Ashley Royalty M.D.   On: 03/02/2018 22:18   Ct Head Wo Contrast  Result Date: 03/18/2018 CLINICAL DATA:  Pain after fall. EXAM: CT HEAD WITHOUT CONTRAST CT CERVICAL SPINE WITHOUT CONTRAST TECHNIQUE: Multidetector CT imaging of  the head and cervical spine was performed following the standard protocol without intravenous contrast. Multiplanar CT image reconstructions of the cervical spine were also generated. COMPARISON:  January 02, 2011 FINDINGS: CT HEAD FINDINGS Brain: No subdural, epidural, or subarachnoid hemorrhage. Ventricles and sulci are stable. A left occipital/temporal infarct is stable. Lacunar infarcts in the cerebellum are stable. The brainstem and basal cisterns are normal. White matter changes persist. No midline shift. Vascular: Calcified atherosclerosis is seen in the intracranial carotids. Skull: Normal. Negative for fracture or focal lesion. Sinuses/Orbits: No acute finding. Other: Soft tissue swelling over the left forehead and periorbital region. The underlying globe is intact. Extracranial soft tissues are otherwise normal. CT CERVICAL SPINE FINDINGS Alignment: There is minimal anterolisthesis of C2 versus C3 measuring approximately 2 mm. No other malalignment. No fractures are seen. The pre odontoid space is normal. Skull base and vertebrae: No acute fracture. No primary bone lesion or focal pathologic process. Soft tissues and spinal canal: No prevertebral fluid or swelling. No visible canal hematoma. Disc levels: Multilevel degenerative disc disease with small anterior and posterior osteophytes. Facet degenerative changes. Upper chest: Negative. Other: No other abnormalities. IMPRESSION: 1. No acute intracranial abnormality. Chronic left occipital/temporal infarct, white matter changes, and cerebellar lacunar infarcts. 2. No fracture. No traumatic malalignment. Minimal anterolisthesis of C2 versus C3 is likely due to facet degenerative changes at this level. No fractures identified at this level. Electronically Signed   By: Dorise Bullion III M.D   On: 03/18/2018 10:34   Ct Head Wo Contrast  Result Date: 03/02/2018 CLINICAL DATA:  Altered level of consciousness EXAM: CT HEAD WITHOUT CONTRAST TECHNIQUE:  Contiguous axial images were obtained from the base of the skull through the vertex without intravenous contrast. COMPARISON:  01/02/2011 FINDINGS: BRAIN: There is sulcal and ventricular prominence consistent with superficial and central atrophy. No intraparenchymal hemorrhage, mass effect nor midline shift. Periventricular and subcortical white matter hypodensities consistent with chronic small vessel ischemic disease are identified. No acute large vascular territory infarcts. Encephalomalacia of the left posterior parietooccipital lobe from remote infarct. Small right-sided basal ganglial lacunar infarcts. Idiopathic bilateral basal ganglial calcifications. No abnormal extra-axial fluid collections. Basal cisterns are not effaced and midline. VASCULAR: Moderate calcific atherosclerosis of the carotid siphons. SKULL: No skull fracture. No significant scalp soft tissue swelling. SINUSES/ORBITS: The mastoid air-cells are clear. The included paranasal sinuses are well-aerated.The included ocular globes and orbital contents are non-suspicious. OTHER: None. IMPRESSION: 1. Atrophy with moderate chronic small vessel ischemia. 2. No acute intracranial abnormality. 3. Chronic left parietal-occipital infarct with encephalomalacia. Electronically Signed   By: Ashley Royalty M.D.   On: 03/02/2018 22:08   Ct Cervical Spine Wo Contrast  Result Date: 03/18/2018 CLINICAL DATA:  Pain after fall. EXAM: CT HEAD WITHOUT CONTRAST CT CERVICAL SPINE WITHOUT CONTRAST TECHNIQUE: Multidetector CT imaging of the head and cervical spine was performed following the standard protocol without intravenous contrast. Multiplanar CT image reconstructions of the cervical spine were also generated. COMPARISON:  January 02, 2011 FINDINGS: CT HEAD FINDINGS Brain: No subdural, epidural, or subarachnoid hemorrhage. Ventricles and sulci are stable. A left occipital/temporal infarct is stable. Lacunar infarcts  in the cerebellum are stable. The brainstem  and basal cisterns are normal. White matter changes persist. No midline shift. Vascular: Calcified atherosclerosis is seen in the intracranial carotids. Skull: Normal. Negative for fracture or focal lesion. Sinuses/Orbits: No acute finding. Other: Soft tissue swelling over the left forehead and periorbital region. The underlying globe is intact. Extracranial soft tissues are otherwise normal. CT CERVICAL SPINE FINDINGS Alignment: There is minimal anterolisthesis of C2 versus C3 measuring approximately 2 mm. No other malalignment. No fractures are seen. The pre odontoid space is normal. Skull base and vertebrae: No acute fracture. No primary bone lesion or focal pathologic process. Soft tissues and spinal canal: No prevertebral fluid or swelling. No visible canal hematoma. Disc levels: Multilevel degenerative disc disease with small anterior and posterior osteophytes. Facet degenerative changes. Upper chest: Negative. Other: No other abnormalities. IMPRESSION: 1. No acute intracranial abnormality. Chronic left occipital/temporal infarct, white matter changes, and cerebellar lacunar infarcts. 2. No fracture. No traumatic malalignment. Minimal anterolisthesis of C2 versus C3 is likely due to facet degenerative changes at this level. No fractures identified at this level. Electronically Signed   By: Dorise Bullion III M.D   On: 03/18/2018 10:34   US Venous Img Lower Bilateral  Result Date: 03/19/2018 CLINICAL DATA:  84 year old female with bilateral lower extremity edema, obesity, recent fall EXAM: BILATERAL LOWER EXTREMITY VENOUS DOPPLER ULTRASOUND TECHNIQUE: Gray-scale sonography with graded compression, as well as color Doppler and duplex ultrasound were performed to evaluate the lower extremity deep venous systems from the level of the common femoral vein and including the common femoral, femoral, profunda femoral, popliteal and calf veins including the posterior tibial, peroneal and gastrocnemius veins when  visible. The superficial great saphenous vein was also interrogated. Spectral Doppler was utilized to evaluate flow at rest and with distal augmentation maneuvers in the common femoral, femoral and popliteal veins. COMPARISON:  None. FINDINGS: RIGHT LOWER EXTREMITY Common Femoral Vein: No evidence of thrombus. Normal compressibility, respiratory phasicity and response to augmentation. Saphenofemoral Junction: No evidence of thrombus. Normal compressibility and flow on color Doppler imaging. Profunda Femoral Vein: No evidence of thrombus. Normal compressibility and flow on color Doppler imaging. Femoral Vein: No evidence of thrombus. Normal compressibility, respiratory phasicity and response to augmentation. Popliteal Vein: No evidence of thrombus. Normal compressibility, respiratory phasicity and response to augmentation. Calf Veins: No evidence of thrombus. Normal compressibility and flow on color Doppler imaging. Superficial Great Saphenous Vein: No evidence of thrombus. Normal compressibility. Venous Reflux:  None. Other Findings:  None. LEFT LOWER EXTREMITY Common Femoral Vein: No evidence of thrombus. Normal compressibility, respiratory phasicity and response to augmentation. Saphenofemoral Junction: No evidence of thrombus. Normal compressibility and flow on color Doppler imaging. Profunda Femoral Vein: No evidence of thrombus. Normal compressibility and flow on color Doppler imaging. Femoral Vein: No evidence of thrombus. Normal compressibility, respiratory phasicity and response to augmentation. Popliteal Vein: No evidence of thrombus. Normal compressibility, respiratory phasicity and response to augmentation. Calf Veins: No evidence of thrombus. Normal compressibility and flow on color Doppler imaging. Superficial Great Saphenous Vein: No evidence of thrombus. Normal compressibility. Venous Reflux:  None. Other Findings:  None. IMPRESSION: No evidence of deep venous thrombosis. Electronically Signed   By:  Jacqulynn Cadet M.D.   On: 03/19/2018 15:44   Dg Foot 2 Views Left  Result Date: 03/18/2018 CLINICAL DATA:  Pain after fall EXAM: LEFT FOOT - 2 VIEW COMPARISON:  None. FINDINGS: Deformity of the proximal right fourth metacarpal is stable consistent with remote trauma.  No acute fractures are seen. IMPRESSION: No acute fracture identified. Electronically Signed   By: Dorise Bullion III M.D   On: 03/18/2018 10:46        Discharge Exam: Vitals:   03/20/18 0809 03/20/18 1403  BP:  127/74  Pulse:  (!) 39  Resp:  18  Temp:  (!) 97.5 F (36.4 C)  SpO2: 94% (!) 88%   Vitals:   03/20/18 0417 03/20/18 0807 03/20/18 0809 03/20/18 1403  BP: (!) 142/86   127/74  Pulse: 71   (!) 39  Resp:    18  Temp: 97.6 F (36.4 C)   (!) 97.5 F (36.4 C)  TempSrc: Oral   Oral  SpO2: 93% 94% 94% (!) 88%  Weight:      Height:        General: Pt is alert, awake, not in acute distress Cardiovascular: RRR, S1/S2 +, no rubs, no gallops Respiratory: CTA bilaterally, no wheezing, no rhonchi Abdominal: Soft, NT, ND, bowel sounds + Extremities: no edema, no cyanosis   The results of significant diagnostics from this hospitalization (including imaging, microbiology, ancillary and laboratory) are listed below for reference.    Significant Diagnostic Studies: Dg Chest 2 View  Result Date: 03/18/2018 CLINICAL DATA:  Bruising and swelling.  Small laceration. EXAM: CHEST - 2 VIEW COMPARISON:  March 02, 2018 FINDINGS: Mild cardiomegaly. The hila, mediastinum, lungs, and pleura are otherwise unremarkable. IMPRESSION: No active cardiopulmonary disease. Electronically Signed   By: Dorise Bullion III M.D   On: 03/18/2018 10:46   Dg Chest 2 View  Result Date: 03/02/2018 CLINICAL DATA:  Altered level of consciousness. EXAM: CHEST - 2 VIEW COMPARISON:  02/09/1989 FINDINGS: Stable cardiomegaly with aortic atherosclerosis. There is uncoiling of the thoracic aorta as before without definite aneurysmal dilatation.  Mild vascular congestion without acute pulmonary consolidation, effusion or pneumothorax. No acute osseous abnormality. Degenerative changes are present along the dorsal spine. IMPRESSION: Cardiomegaly with aortic atherosclerosis.  Mild vascular congestion. Electronically Signed   By: Ashley Royalty M.D.   On: 03/02/2018 22:18   Ct Head Wo Contrast  Result Date: 03/18/2018 CLINICAL DATA:  Pain after fall. EXAM: CT HEAD WITHOUT CONTRAST CT CERVICAL SPINE WITHOUT CONTRAST TECHNIQUE: Multidetector CT imaging of the head and cervical spine was performed following the standard protocol without intravenous contrast. Multiplanar CT image reconstructions of the cervical spine were also generated. COMPARISON:  January 02, 2011 FINDINGS: CT HEAD FINDINGS Brain: No subdural, epidural, or subarachnoid hemorrhage. Ventricles and sulci are stable. A left occipital/temporal infarct is stable. Lacunar infarcts in the cerebellum are stable. The brainstem and basal cisterns are normal. White matter changes persist. No midline shift. Vascular: Calcified atherosclerosis is seen in the intracranial carotids. Skull: Normal. Negative for fracture or focal lesion. Sinuses/Orbits: No acute finding. Other: Soft tissue swelling over the left forehead and periorbital region. The underlying globe is intact. Extracranial soft tissues are otherwise normal. CT CERVICAL SPINE FINDINGS Alignment: There is minimal anterolisthesis of C2 versus C3 measuring approximately 2 mm. No other malalignment. No fractures are seen. The pre odontoid space is normal. Skull base and vertebrae: No acute fracture. No primary bone lesion or focal pathologic process. Soft tissues and spinal canal: No prevertebral fluid or swelling. No visible canal hematoma. Disc levels: Multilevel degenerative disc disease with small anterior and posterior osteophytes. Facet degenerative changes. Upper chest: Negative. Other: No other abnormalities. IMPRESSION: 1. No acute  intracranial abnormality. Chronic left occipital/temporal infarct, white matter changes, and cerebellar lacunar infarcts. 2.  No fracture. No traumatic malalignment. Minimal anterolisthesis of C2 versus C3 is likely due to facet degenerative changes at this level. No fractures identified at this level. Electronically Signed   By: Dorise Bullion III M.D   On: 03/18/2018 10:34   Ct Head Wo Contrast  Result Date: 03/02/2018 CLINICAL DATA:  Altered level of consciousness EXAM: CT HEAD WITHOUT CONTRAST TECHNIQUE: Contiguous axial images were obtained from the base of the skull through the vertex without intravenous contrast. COMPARISON:  01/02/2011 FINDINGS: BRAIN: There is sulcal and ventricular prominence consistent with superficial and central atrophy. No intraparenchymal hemorrhage, mass effect nor midline shift. Periventricular and subcortical white matter hypodensities consistent with chronic small vessel ischemic disease are identified. No acute large vascular territory infarcts. Encephalomalacia of the left posterior parietooccipital lobe from remote infarct. Small right-sided basal ganglial lacunar infarcts. Idiopathic bilateral basal ganglial calcifications. No abnormal extra-axial fluid collections. Basal cisterns are not effaced and midline. VASCULAR: Moderate calcific atherosclerosis of the carotid siphons. SKULL: No skull fracture. No significant scalp soft tissue swelling. SINUSES/ORBITS: The mastoid air-cells are clear. The included paranasal sinuses are well-aerated.The included ocular globes and orbital contents are non-suspicious. OTHER: None. IMPRESSION: 1. Atrophy with moderate chronic small vessel ischemia. 2. No acute intracranial abnormality. 3. Chronic left parietal-occipital infarct with encephalomalacia. Electronically Signed   By: Ashley Royalty M.D.   On: 03/02/2018 22:08   Ct Cervical Spine Wo Contrast  Result Date: 03/18/2018 CLINICAL DATA:  Pain after fall. EXAM: CT HEAD WITHOUT  CONTRAST CT CERVICAL SPINE WITHOUT CONTRAST TECHNIQUE: Multidetector CT imaging of the head and cervical spine was performed following the standard protocol without intravenous contrast. Multiplanar CT image reconstructions of the cervical spine were also generated. COMPARISON:  January 02, 2011 FINDINGS: CT HEAD FINDINGS Brain: No subdural, epidural, or subarachnoid hemorrhage. Ventricles and sulci are stable. A left occipital/temporal infarct is stable. Lacunar infarcts in the cerebellum are stable. The brainstem and basal cisterns are normal. White matter changes persist. No midline shift. Vascular: Calcified atherosclerosis is seen in the intracranial carotids. Skull: Normal. Negative for fracture or focal lesion. Sinuses/Orbits: No acute finding. Other: Soft tissue swelling over the left forehead and periorbital region. The underlying globe is intact. Extracranial soft tissues are otherwise normal. CT CERVICAL SPINE FINDINGS Alignment: There is minimal anterolisthesis of C2 versus C3 measuring approximately 2 mm. No other malalignment. No fractures are seen. The pre odontoid space is normal. Skull base and vertebrae: No acute fracture. No primary bone lesion or focal pathologic process. Soft tissues and spinal canal: No prevertebral fluid or swelling. No visible canal hematoma. Disc levels: Multilevel degenerative disc disease with small anterior and posterior osteophytes. Facet degenerative changes. Upper chest: Negative. Other: No other abnormalities. IMPRESSION: 1. No acute intracranial abnormality. Chronic left occipital/temporal infarct, white matter changes, and cerebellar lacunar infarcts. 2. No fracture. No traumatic malalignment. Minimal anterolisthesis of C2 versus C3 is likely due to facet degenerative changes at this level. No fractures identified at this level. Electronically Signed   By: Dorise Bullion III M.D   On: 03/18/2018 10:34   US Venous Img Lower Bilateral  Result Date:  03/19/2018 CLINICAL DATA:  84 year old female with bilateral lower extremity edema, obesity, recent fall EXAM: BILATERAL LOWER EXTREMITY VENOUS DOPPLER ULTRASOUND TECHNIQUE: Gray-scale sonography with graded compression, as well as color Doppler and duplex ultrasound were performed to evaluate the lower extremity deep venous systems from the level of the common femoral vein and including the common femoral, femoral, profunda femoral, popliteal and  calf veins including the posterior tibial, peroneal and gastrocnemius veins when visible. The superficial great saphenous vein was also interrogated. Spectral Doppler was utilized to evaluate flow at rest and with distal augmentation maneuvers in the common femoral, femoral and popliteal veins. COMPARISON:  None. FINDINGS: RIGHT LOWER EXTREMITY Common Femoral Vein: No evidence of thrombus. Normal compressibility, respiratory phasicity and response to augmentation. Saphenofemoral Junction: No evidence of thrombus. Normal compressibility and flow on color Doppler imaging. Profunda Femoral Vein: No evidence of thrombus. Normal compressibility and flow on color Doppler imaging. Femoral Vein: No evidence of thrombus. Normal compressibility, respiratory phasicity and response to augmentation. Popliteal Vein: No evidence of thrombus. Normal compressibility, respiratory phasicity and response to augmentation. Calf Veins: No evidence of thrombus. Normal compressibility and flow on color Doppler imaging. Superficial Great Saphenous Vein: No evidence of thrombus. Normal compressibility. Venous Reflux:  None. Other Findings:  None. LEFT LOWER EXTREMITY Common Femoral Vein: No evidence of thrombus. Normal compressibility, respiratory phasicity and response to augmentation. Saphenofemoral Junction: No evidence of thrombus. Normal compressibility and flow on color Doppler imaging. Profunda Femoral Vein: No evidence of thrombus. Normal compressibility and flow on color Doppler imaging.  Femoral Vein: No evidence of thrombus. Normal compressibility, respiratory phasicity and response to augmentation. Popliteal Vein: No evidence of thrombus. Normal compressibility, respiratory phasicity and response to augmentation. Calf Veins: No evidence of thrombus. Normal compressibility and flow on color Doppler imaging. Superficial Great Saphenous Vein: No evidence of thrombus. Normal compressibility. Venous Reflux:  None. Other Findings:  None. IMPRESSION: No evidence of deep venous thrombosis. Electronically Signed   By: Jacqulynn Cadet M.D.   On: 03/19/2018 15:44   Dg Foot 2 Views Left  Result Date: 03/18/2018 CLINICAL DATA:  Pain after fall EXAM: LEFT FOOT - 2 VIEW COMPARISON:  None. FINDINGS: Deformity of the proximal right fourth metacarpal is stable consistent with remote trauma. No acute fractures are seen. IMPRESSION: No acute fracture identified. Electronically Signed   By: Dorise Bullion III M.D   On: 03/18/2018 10:46     Microbiology: Recent Results (from the past 240 hour(s))  MRSA PCR Screening     Status: None   Collection Time: 03/18/18  9:26 PM  Result Value Ref Range Status   MRSA by PCR NEGATIVE NEGATIVE Final    Comment:        The GeneXpert MRSA Assay (FDA approved for NASAL specimens only), is one component of a comprehensive MRSA colonization surveillance program. It is not intended to diagnose MRSA infection nor to guide or monitor treatment for MRSA infections. Performed at [facility], [address], [place], [place] [address]   Respiratory Panel by PCR     Status: Abnormal   Collection Time: 03/18/18  9:26 PM  Result Value Ref Range Status   Adenovirus NOT DETECTED NOT DETECTED Final   Coronavirus 229E NOT DETECTED NOT DETECTED Final   Coronavirus HKU1 NOT DETECTED NOT DETECTED Final   Coronavirus NL63 NOT DETECTED NOT DETECTED Final   Coronavirus OC43 NOT DETECTED NOT DETECTED Final   Metapneumovirus NOT DETECTED NOT DETECTED Final   Rhinovirus  / Enterovirus DETECTED (A) NOT DETECTED Final   Influenza A NOT DETECTED NOT DETECTED Final   Influenza B NOT DETECTED NOT DETECTED Final   Parainfluenza Virus 1 NOT DETECTED NOT DETECTED Final   Parainfluenza Virus 2 NOT DETECTED NOT DETECTED Final   Parainfluenza Virus 3 NOT DETECTED NOT DETECTED Final   Parainfluenza Virus 4 NOT DETECTED NOT DETECTED Final   Respiratory  Syncytial Virus NOT DETECTED NOT DETECTED Final   Bordetella pertussis NOT DETECTED NOT DETECTED Final   Chlamydophila pneumoniae NOT DETECTED NOT DETECTED Final   Mycoplasma pneumoniae NOT DETECTED NOT DETECTED Final    Comment: Performed at [facility], [place] [address] [place], [place] [address]  Culture, Urine     Status: None   Collection Time: 03/19/18  4:48 AM  Result Value Ref Range Status   Specimen Description   Final    URINE, CATHETERIZED Performed at [facility], [address], [place], [place] [address]    Special Requests   Final    NONE Performed at [facility], [address], [place], [place] [address]    Culture   Final    NO GROWTH Performed at [facility], [place] [address] [place], [place] [address]    Report Status 03/20/2018 FINAL  Final     Labs: Basic Metabolic Panel: Recent Labs  Lab 03/18/18 1037 03/18/18 2131 03/19/18 0420 03/20/18 0459  NA 140 139 140 139  K 3.6 3.3* 3.1* 4.1  CL 103 102 104 102  CO2 23 24 23 24   GLUCOSE 519* 455* 117* 321*  BUN 35* 36* 35* 39*  CREATININE 2.08* 1.95* 1.85* 1.80*  CALCIUM 8.7* 8.8* 8.7* 9.0  MG  --   --   --  1.8   Liver Function Tests: Recent Labs  Lab 03/19/18 0849  AST 25  ALT 15  ALKPHOS 66  BILITOT 0.5  PROT 6.1*  ALBUMIN 3.2*   No results for input(s): LIPASE, AMYLASE in the last 168 hours. No results for input(s): AMMONIA in the last 168 hours. CBC: Recent Labs  Lab 03/18/18 1037 03/20/18 0459  WBC 5.7 5.7  HGB 8.9* 9.2*  HCT 27.8* 29.0*  MCV 89.4 89.5  PLT 228 245   Cardiac Enzymes: Recent  Labs  Lab 03/18/18 1037  TROPONINI <0.03   BNP: Invalid input(s): POCBNP CBG: Recent Labs  Lab 03/19/18 2140 03/20/18 0015 03/20/18 0416 03/20/18 0734 03/20/18 1202  GLUCAP 290* 285* 285* 304* 321*    Time coordinating discharge:  36 minutes  Signed:  Orson Eva, DO [facility] Pager: [phone] 03/20/2018, 2:44 PM

## 2018-03-20 NOTE — Progress Notes (Signed)
Patient's IV removed.  Site WNL.  AVS reviewed with patient's daughter.  Given to daughter for facility.  Patient stable.  Patient's daughter to transport patient to [facility].

## 2018-03-20 NOTE — Progress Notes (Signed)
*  PRELIMINARY RESULTS* Echocardiogram 2D Echocardiogram has been performed.  Nancy Medina 03/20/2018, 4:24 PM

## 2018-03-20 NOTE — Progress Notes (Signed)
Physical Therapy Treatment Patient Details Name: Nancy Medina MRN: [id number] DOB: 12/29/1924 Today's Date: 03/20/2018    History of Present Illness Nancy Medina is a 84 y.o. female with medical history of hypertension, diabetes mellitus, CKD, stroke, hyperlipidemia, presenting after mechanical fall at [facility]  The patient is a poor historian.  This history is supplemented by the patient's daughter at the bedside.  Apparently, the patient has had coughing and wheezing and chest congestion for the past 3 to 4 days prior to this admission.  Associated with this, the patient has had some worsening generalized weakness.  The patient went to the emergency department at [facility] on 03/17/2018.  She was discharged from the emergency department in stable condition with a prescription for azithromycin and albuterol.  On the morning of 03/10/2018, the patient was going back to her bedroom and had a mechanical fall hitting her face.  The patient denies any fevers, chills, nausea, vomiting, diarrhea, chest pain.  She denies any shortness of breath.  After the mechanical fall, life alert was activated.  The patient denied any syncope.    PT Comments    Patient with much improved endurance today ambulating 500 feet with RW with min guard; ambulation was slow and patient did take several standing breaks to converse. Continues to exhibit deficits in safety, balance, transfers and bed mobility.  Patient would continue to benefit from skilled physical therapy in current environment and next venue to continue return to prior function and increase strength, endurance, balance, coordination, and functional mobility and gait skills.     Follow Up Recommendations  Home health PT     Equipment Recommendations  None recommended by PT    Recommendations for Other Services       Precautions / Restrictions Precautions Precautions: Fall Restrictions Weight Bearing Restrictions: No    Mobility  Bed  Mobility Overal bed mobility: Needs Assistance Bed Mobility: Sit to Supine       Sit to supine: Min assist      Transfers Overall transfer level: Needs assistance Equipment used: Rolling walker (2 wheeled) Transfers: Sit to/from [facility] Sit to Stand: Min guard Stand pivot transfers: Min guard       General transfer comment: required verbal cues for proper hand placement during sit to stands  Ambulation/Gait Ambulation/Gait assistance: Min guard Ambulation Distance (Feet): 500 Feet Assistive device: Rolling walker (2 wheeled) Gait Pattern/deviations: Decreased step length - right;Decreased step length - left;Decreased stride length Gait velocity: decreased   General Gait Details: slow, steady gait with frequent stand breaks to talk   Stairs             Wheelchair Mobility    Modified Rankin (Stroke Patients Only)       Balance Overall balance assessment: Needs assistance Sitting-balance support: Feet supported;No upper extremity supported Sitting balance-Leahy Scale: Good     Standing balance support: Bilateral upper extremity supported;During functional activity Standing balance-Leahy Scale: Fair                              Cognition Arousal/Alertness: Awake/alert Behavior During Therapy: WFL for tasks assessed/performed Overall Cognitive Status: Within Functional Limits for tasks assessed                                        Exercises      General  Comments        Pertinent Vitals/Pain Pain Assessment: No/denies pain    Home Living                      Prior Function            PT Goals (current goals can now be found in the care plan section) Acute Rehab PT Goals Patient Stated Goal: return home PT Goal Formulation: With patient Time For Goal Achievement: 03/19/18 Potential to Achieve Goals: Good Progress towards PT goals: Progressing toward goals    Frequency    Min  3X/week      PT Plan      Co-evaluation              AM-PAC PT "6 Clicks" Daily Activity  Outcome Measure  Difficulty turning over in bed (including adjusting bedclothes, sheets and blankets)?: None Difficulty moving from lying on back to sitting on the side of the bed? : A Little Difficulty sitting down on and standing up from a chair with arms (e.g., wheelchair, bedside commode, etc,.)?: A Little Help needed moving to and from a bed to chair (including a wheelchair)?: A Little Help needed walking in hospital room?: A Little Help needed climbing 3-5 steps with a railing? : A Little 6 Click Score: 19    End of Session Equipment Utilized During Treatment: Gait belt Activity Tolerance: Patient tolerated treatment well(Patient limited by O2 desaturation on exertion) Patient left: in chair;with call bell/phone within reach;with family/visitor present Nurse Communication: Mobility status PT Visit Diagnosis: Other abnormalities of gait and mobility (R26.89);Unsteadiness on feet (R26.81);Muscle weakness (generalized) (M62.81)     Time: 1696-7893 PT Time Calculation (min) (ACUTE ONLY): 32 min  Charges:  $Gait Training: 23-37 mins                    G Codes:       Lamondre Wesche D. Hartnett-Rands, MS, PT Per [place] [phone] 03/20/2018, 1:22 PM

## 2018-03-20 NOTE — NC FL2 (Signed)
[place] LEVEL OF CARE SCREENING TOOL     IDENTIFICATION  Patient Name: Nancy Medina Birthdate: [birth year] Sex: female Admission Date (Current Location): 03/18/2018  [facility] and [place] Number:  [employer] and Address:  [place] [address], [place]      Provider Number: [id number]  Attending Physician Name and Address:  Orson Eva, MD  Relative Name and Phone Number:  Mickie Kay (daughter) [phone]    Current Level of Care: Hospital Recommended Level of Care: [place] Prior Approval Number:    Date Approved/Denied:   PASRR Number:    Discharge Plan: Other (Comment)    Current Diagnoses: Patient Active Problem List   Diagnosis Date Noted  . Acute bronchitis due to Rhinovirus 03/20/2018  . Diabetic hyperosmolar non-ketotic state ([place]) 03/18/2018  . Acute respiratory failure with hypoxia ([place]) 03/18/2018  . CKD (chronic kidney disease) stage 4, GFR 15-29 ml/min (HCC) 03/18/2018  . AKI (acute kidney injury) ([place]) 02/09/2018  . Acute lower UTI 02/09/2018  . Nausea 02/09/2018  . Constipation 02/09/2018  . Diarrhea 11/25/2011  . Lymphocytic colitis 06/15/2011  . Esophageal dysphagia 06/15/2011  . UNSPECIFIED ANEMIA 04/27/2009  . DIASTOLIC DYSFUNCTION 61/60/7371  . ACHILLES TENDINITIS 10/27/2008  . PERIPHERAL EDEMA 10/14/2008  . ALLERGIC RHINITIS, SEASONAL 03/26/2008  . RENAL DISEASE, CHRONIC, STAGE III 03/26/2008  . DEGENERATIVE DISC DISEASE 01/15/2008  . Type 2 diabetes mellitus with hyperlipidemia ([place]) 12/13/2007  . HYPERLIPIDEMIA 10/11/2006  . OBESITY NOS 10/11/2006  . ANXIETY 10/11/2006  . DEPRESSION 10/11/2006  . GLAUCOMA NOS 10/11/2006  . BENIGN POSITIONAL VERTIGO 10/11/2006  . Essential hypertension 10/11/2006  . GERD 10/11/2006  . OVERACTIVE BLADDER 10/11/2006  . OSTEOPOROSIS 10/11/2006  . BREAST CANCER, HX OF 10/11/2006  . CEREBROVASCULAR ACCIDENT, HX OF 10/11/2006     Orientation RESPIRATION BLADDER Height & Weight     Self, Situation, Place  Normal Continent Weight: 185 lb 13.6 oz (84.3 kg) Height:  5\' 2"  (157.5 cm)  BEHAVIORAL SYMPTOMS/MOOD NEUROLOGICAL BOWEL NUTRITION STATUS      Continent    AMBULATORY STATUS COMMUNICATION OF NEEDS Skin   Independent Verbally Normal                       Personal Care Assistance Level of Assistance  Bathing, Feeding, Dressing Bathing Assistance: Limited assistance Feeding assistance: Independent Dressing Assistance: Limited assistance     Functional Limitations Info  Sight, Hearing, Speech Sight Info: Adequate Hearing Info: Adequate Speech Info: Adequate    SPECIAL CARE FACTORS FREQUENCY        PT Frequency: Home Health PT 3 times week              Contractures Contractures Info: Not present    Additional Factors Info  Code Status, Allergies, Psychotropic Code Status Info: DNR Allergies Info: Metformin Psychotropic Info: klonopin         Current Medications (03/20/2018):  This is the current hospital active medication list Current Facility-Administered Medications  Medication Dose Route Frequency Provider Last Rate Last Dose  . aspirin chewable tablet 81 mg  81 mg Oral Daily Tat, Shanon Brow, MD   81 mg at 03/20/18 0845  . budesonide (PULMICORT) nebulizer solution 0.5 mg  0.5 mg Nebulization BID Tat, Shanon Brow, MD   0.5 mg at 03/20/18 0807  . cholecalciferol (VITAMIN D) tablet 5,000 Units  5,000 Units Oral Daily Tat, David, MD   5,000 Units at 03/20/18 0844  .  clonazePAM (KLONOPIN) tablet 1 mg  1 mg Oral QHS Tat, Shanon Brow, MD   1 mg at 03/19/18 2100  . ezetimibe (ZETIA) tablet 10 mg  10 mg Oral Benay Pike, MD   10 mg at 03/19/18 2100  . furosemide (LASIX) tablet 20 mg  20 mg Oral Daily Tat, David, MD   20 mg at 03/20/18 0846  . heparin injection 5,000 Units  5,000 Units Subcutaneous Franco Collet, MD   5,000 Units at 03/20/18 1455  . insulin aspart (novoLOG) injection 0-5 Units  0-5  Units Subcutaneous Benay Pike, MD   3 Units at 03/19/18 2232  . insulin aspart (novoLOG) injection 0-9 Units  0-9 Units Subcutaneous TID WC Orson Eva, MD   7 Units at 03/20/18 1235  . ipratropium-albuterol (DUONEB) 0.5-2.5 (3) MG/3ML nebulizer solution 3 mL  3 mL Nebulization TID Tat, Shanon Brow, MD   3 mL at 03/20/18 1419  . omega-3 acid ethyl esters (LOVAZA) capsule 2 g  2 g Oral Daily Tat, David, MD   2 g at 03/20/18 0845  . pantoprazole (PROTONIX) EC tablet 80 mg  80 mg Oral Daily Tat, Shanon Brow, MD   80 mg at 03/20/18 0846  . predniSONE (DELTASONE) tablet 60 mg  60 mg Oral Q breakfast Tat, Shanon Brow, MD   60 mg at 03/20/18 0846  . timolol (TIMOPTIC) 0.5 % ophthalmic solution 1 drop  1 drop Both Eyes BID Tat, David, MD   1 drop at 03/20/18 1236  . vitamin B-12 (CYANOCOBALAMIN) tablet 1,000 mcg  1,000 mcg Oral Daily Tat, David, MD   1,000 mcg at 03/20/18 1235     Discharge Medications: Medication List    STOP taking these medications   amLODipine 10 MG tablet Commonly known as:  NORVASC   azithromycin 500 MG tablet Commonly known as:  ZITHROMAX   diltiazem 120 MG tablet Commonly known as:  CARDIZEM     TAKE these medications   acetaminophen 500 MG tablet Commonly known as:  TYLENOL Take 500 mg by mouth every 6 (six) hours as needed for mild pain.   aspirin 81 MG tablet Take 81 mg by mouth daily.   clonazePAM 1 MG tablet Commonly known as:  KLONOPIN Take 1 mg by mouth 3 (three) times daily as needed for anxiety. Take 1 tablet by mouth at bedtime for sleep.   ezetimibe 10 MG tablet Commonly known as:  ZETIA Take 1 tablet by mouth daily.   fish oil-omega-3 fatty acids 1000 MG capsule Take 1 g by mouth daily.   furosemide 20 MG tablet Commonly known as:  LASIX Take 1 tablet by mouth daily.   latanoprost 0.005 % ophthalmic solution Commonly known as:  XALATAN Place 1 drop into both eyes daily.   loperamide 2 MG tablet Commonly known as:  IMODIUM A-D Take 2 mg  by mouth 4 (four) times daily as needed for diarrhea or loose stools.   meclizine 25 MG tablet Commonly known as:  ANTIVERT Take 25 mg by mouth 4 (four) times daily as needed for dizziness.   omeprazole 40 MG capsule Commonly known as:  PRILOSEC Take 1 capsule by mouth daily.   predniSONE 10 MG tablet Commonly known as:  DELTASONE Take 5 tablets (50 mg total) by mouth daily with breakfast. And decrease by one tablet daily Start taking on:  03/21/2018   PROAIR HFA 108 (90 Base) MCG/ACT inhaler Generic drug:  albuterol Take 1-2 puffs by mouth every 6 (six) hours as needed.  sitaGLIPtin 25 MG tablet Commonly known as:  JANUVIA Take 1 tablet (25 mg total) by mouth daily.   timolol 0.5 % ophthalmic solution Commonly known as:  TIMOPTIC Place 1 drop into both eyes 2 (two) times daily.   vitamin B-12 1000 MCG tablet Commonly known as:  CYANOCOBALAMIN Take 1 tablet (1,000 mcg total) by mouth daily. What changed:    medication strength  how much to take   Vitamin D 2000 units Caps Take 1 capsule by mouth daily. What changed:  Another medication with the same name was removed. Continue taking this medication, and follow the directions you see here.   VITAMIN D-1000 MAX ST 1000 units tablet Generic drug:  Cholecalciferol Take 5,000 Units by mouth daily. What changed:  Another medication with the same name was removed. Continue taking this medication, and follow the directions you see here.         Relevant Imaging Results:  Relevant Lab Results:   Additional Information    Elliet Goodnow, Clydene Pugh, LCSW

## 2018-03-20 NOTE — Clinical Social Work Note (Signed)
Facility aware of discharge and clinicals faxed. LCSW signing off. Per RN, daughter to provide transport to facility.    LCSW signing off.    Kiyan Burmester, Clydene Pugh, LCSW

## 2018-03-22 DIAGNOSIS — J206 Acute bronchitis due to rhinovirus: Secondary | ICD-10-CM | POA: Diagnosis not present

## 2018-03-22 DIAGNOSIS — Z7984 Long term (current) use of oral hypoglycemic drugs: Secondary | ICD-10-CM | POA: Diagnosis not present

## 2018-03-22 DIAGNOSIS — Z853 Personal history of malignant neoplasm of breast: Secondary | ICD-10-CM | POA: Diagnosis not present

## 2018-03-22 DIAGNOSIS — I1310 Hypertensive heart and chronic kidney disease without heart failure, with stage 1 through stage 4 chronic kidney disease, or unspecified chronic kidney disease: Secondary | ICD-10-CM | POA: Diagnosis not present

## 2018-03-22 DIAGNOSIS — Z8673 Personal history of transient ischemic attack (TIA), and cerebral infarction without residual deficits: Secondary | ICD-10-CM | POA: Diagnosis not present

## 2018-03-22 DIAGNOSIS — M810 Age-related osteoporosis without current pathological fracture: Secondary | ICD-10-CM | POA: Diagnosis not present

## 2018-03-22 DIAGNOSIS — H409 Unspecified glaucoma: Secondary | ICD-10-CM | POA: Diagnosis not present

## 2018-03-22 DIAGNOSIS — Z6830 Body mass index (BMI) 30.0-30.9, adult: Secondary | ICD-10-CM | POA: Diagnosis not present

## 2018-03-22 DIAGNOSIS — I5030 Unspecified diastolic (congestive) heart failure: Secondary | ICD-10-CM | POA: Diagnosis not present

## 2018-03-22 DIAGNOSIS — Z7982 Long term (current) use of aspirin: Secondary | ICD-10-CM | POA: Diagnosis not present

## 2018-03-22 DIAGNOSIS — N184 Chronic kidney disease, stage 4 (severe): Secondary | ICD-10-CM | POA: Diagnosis not present

## 2018-03-22 DIAGNOSIS — E1122 Type 2 diabetes mellitus with diabetic chronic kidney disease: Secondary | ICD-10-CM | POA: Diagnosis not present

## 2018-03-22 DIAGNOSIS — E669 Obesity, unspecified: Secondary | ICD-10-CM | POA: Diagnosis not present

## 2018-03-22 DIAGNOSIS — S0083XD Contusion of other part of head, subsequent encounter: Secondary | ICD-10-CM | POA: Diagnosis not present

## 2018-03-22 DIAGNOSIS — E1165 Type 2 diabetes mellitus with hyperglycemia: Secondary | ICD-10-CM | POA: Diagnosis not present

## 2018-03-22 DIAGNOSIS — Z9181 History of falling: Secondary | ICD-10-CM | POA: Diagnosis not present

## 2018-03-22 DIAGNOSIS — W19XXXD Unspecified fall, subsequent encounter: Secondary | ICD-10-CM | POA: Diagnosis not present

## 2018-03-22 DIAGNOSIS — Z87440 Personal history of urinary (tract) infections: Secondary | ICD-10-CM | POA: Diagnosis not present

## 2018-03-27 DIAGNOSIS — R5383 Other fatigue: Secondary | ICD-10-CM | POA: Diagnosis not present

## 2018-03-27 DIAGNOSIS — E1165 Type 2 diabetes mellitus with hyperglycemia: Secondary | ICD-10-CM | POA: Diagnosis not present

## 2018-03-27 DIAGNOSIS — J208 Acute bronchitis due to other specified organisms: Secondary | ICD-10-CM | POA: Diagnosis not present

## 2018-03-27 DIAGNOSIS — W19XXXD Unspecified fall, subsequent encounter: Secondary | ICD-10-CM | POA: Diagnosis not present

## 2018-03-28 DIAGNOSIS — E118 Type 2 diabetes mellitus with unspecified complications: Secondary | ICD-10-CM | POA: Diagnosis not present

## 2018-03-28 DIAGNOSIS — E6609 Other obesity due to excess calories: Secondary | ICD-10-CM | POA: Diagnosis not present

## 2018-03-28 DIAGNOSIS — E119 Type 2 diabetes mellitus without complications: Secondary | ICD-10-CM | POA: Diagnosis not present

## 2018-03-28 DIAGNOSIS — E559 Vitamin D deficiency, unspecified: Secondary | ICD-10-CM | POA: Diagnosis not present

## 2018-03-29 DIAGNOSIS — M810 Age-related osteoporosis without current pathological fracture: Secondary | ICD-10-CM | POA: Diagnosis not present

## 2018-03-29 DIAGNOSIS — Z6830 Body mass index (BMI) 30.0-30.9, adult: Secondary | ICD-10-CM | POA: Diagnosis not present

## 2018-03-29 DIAGNOSIS — E669 Obesity, unspecified: Secondary | ICD-10-CM | POA: Diagnosis not present

## 2018-03-29 DIAGNOSIS — E1169 Type 2 diabetes mellitus with other specified complication: Secondary | ICD-10-CM | POA: Diagnosis not present

## 2018-03-29 DIAGNOSIS — Z87440 Personal history of urinary (tract) infections: Secondary | ICD-10-CM | POA: Diagnosis not present

## 2018-03-29 DIAGNOSIS — E1122 Type 2 diabetes mellitus with diabetic chronic kidney disease: Secondary | ICD-10-CM | POA: Diagnosis not present

## 2018-03-29 DIAGNOSIS — I5030 Unspecified diastolic (congestive) heart failure: Secondary | ICD-10-CM | POA: Diagnosis not present

## 2018-03-29 DIAGNOSIS — Z853 Personal history of malignant neoplasm of breast: Secondary | ICD-10-CM | POA: Diagnosis not present

## 2018-03-29 DIAGNOSIS — M79675 Pain in left toe(s): Secondary | ICD-10-CM | POA: Diagnosis not present

## 2018-03-29 DIAGNOSIS — B351 Tinea unguium: Secondary | ICD-10-CM | POA: Diagnosis not present

## 2018-03-29 DIAGNOSIS — Z9181 History of falling: Secondary | ICD-10-CM | POA: Diagnosis not present

## 2018-03-29 DIAGNOSIS — Z7982 Long term (current) use of aspirin: Secondary | ICD-10-CM | POA: Diagnosis not present

## 2018-03-29 DIAGNOSIS — Z8673 Personal history of transient ischemic attack (TIA), and cerebral infarction without residual deficits: Secondary | ICD-10-CM | POA: Diagnosis not present

## 2018-03-29 DIAGNOSIS — I1310 Hypertensive heart and chronic kidney disease without heart failure, with stage 1 through stage 4 chronic kidney disease, or unspecified chronic kidney disease: Secondary | ICD-10-CM | POA: Diagnosis not present

## 2018-03-29 DIAGNOSIS — H409 Unspecified glaucoma: Secondary | ICD-10-CM | POA: Diagnosis not present

## 2018-03-29 DIAGNOSIS — E785 Hyperlipidemia, unspecified: Secondary | ICD-10-CM | POA: Diagnosis not present

## 2018-03-29 DIAGNOSIS — N183 Chronic kidney disease, stage 3 (moderate): Secondary | ICD-10-CM | POA: Diagnosis not present

## 2018-04-03 DIAGNOSIS — F419 Anxiety disorder, unspecified: Secondary | ICD-10-CM | POA: Diagnosis not present

## 2018-04-03 DIAGNOSIS — E1165 Type 2 diabetes mellitus with hyperglycemia: Secondary | ICD-10-CM | POA: Diagnosis not present

## 2018-04-03 DIAGNOSIS — N183 Chronic kidney disease, stage 3 (moderate): Secondary | ICD-10-CM | POA: Diagnosis not present

## 2018-04-03 DIAGNOSIS — W19XXXD Unspecified fall, subsequent encounter: Secondary | ICD-10-CM | POA: Diagnosis not present

## 2018-04-24 DIAGNOSIS — Z9181 History of falling: Secondary | ICD-10-CM | POA: Diagnosis not present

## 2018-04-24 DIAGNOSIS — I5030 Unspecified diastolic (congestive) heart failure: Secondary | ICD-10-CM | POA: Diagnosis not present

## 2018-04-24 DIAGNOSIS — I1310 Hypertensive heart and chronic kidney disease without heart failure, with stage 1 through stage 4 chronic kidney disease, or unspecified chronic kidney disease: Secondary | ICD-10-CM | POA: Diagnosis not present

## 2018-04-24 DIAGNOSIS — Z7982 Long term (current) use of aspirin: Secondary | ICD-10-CM | POA: Diagnosis not present

## 2018-04-24 DIAGNOSIS — N184 Chronic kidney disease, stage 4 (severe): Secondary | ICD-10-CM | POA: Diagnosis not present

## 2018-04-24 DIAGNOSIS — Z87440 Personal history of urinary (tract) infections: Secondary | ICD-10-CM | POA: Diagnosis not present

## 2018-04-24 DIAGNOSIS — Z7984 Long term (current) use of oral hypoglycemic drugs: Secondary | ICD-10-CM | POA: Diagnosis not present

## 2018-04-24 DIAGNOSIS — M810 Age-related osteoporosis without current pathological fracture: Secondary | ICD-10-CM | POA: Diagnosis not present

## 2018-04-24 DIAGNOSIS — E669 Obesity, unspecified: Secondary | ICD-10-CM | POA: Diagnosis not present

## 2018-04-24 DIAGNOSIS — S0083XD Contusion of other part of head, subsequent encounter: Secondary | ICD-10-CM | POA: Diagnosis not present

## 2018-04-24 DIAGNOSIS — E1165 Type 2 diabetes mellitus with hyperglycemia: Secondary | ICD-10-CM | POA: Diagnosis not present

## 2018-04-24 DIAGNOSIS — H409 Unspecified glaucoma: Secondary | ICD-10-CM | POA: Diagnosis not present

## 2018-04-24 DIAGNOSIS — Z6830 Body mass index (BMI) 30.0-30.9, adult: Secondary | ICD-10-CM | POA: Diagnosis not present

## 2018-04-24 DIAGNOSIS — W19XXXD Unspecified fall, subsequent encounter: Secondary | ICD-10-CM | POA: Diagnosis not present

## 2018-04-24 DIAGNOSIS — E1122 Type 2 diabetes mellitus with diabetic chronic kidney disease: Secondary | ICD-10-CM | POA: Diagnosis not present

## 2018-04-24 DIAGNOSIS — Z853 Personal history of malignant neoplasm of breast: Secondary | ICD-10-CM | POA: Diagnosis not present

## 2018-04-24 DIAGNOSIS — J206 Acute bronchitis due to rhinovirus: Secondary | ICD-10-CM | POA: Diagnosis not present

## 2018-04-24 DIAGNOSIS — Z8673 Personal history of transient ischemic attack (TIA), and cerebral infarction without residual deficits: Secondary | ICD-10-CM | POA: Diagnosis not present

## 2018-05-08 DIAGNOSIS — E1165 Type 2 diabetes mellitus with hyperglycemia: Secondary | ICD-10-CM | POA: Diagnosis not present

## 2018-05-08 DIAGNOSIS — R0602 Shortness of breath: Secondary | ICD-10-CM | POA: Diagnosis not present

## 2018-05-09 ENCOUNTER — Emergency Department (HOSPITAL_COMMUNITY)
Admission: EM | Admit: 2018-05-09 | Discharge: 2018-05-09 | Disposition: A | Discharge status: Home / Self Care | Payer: PPO | Attending: Emergency Medicine | Admitting: Emergency Medicine

## 2018-05-09 ENCOUNTER — Other Ambulatory Visit: Payer: Self-pay

## 2018-05-09 ENCOUNTER — Emergency Department (HOSPITAL_COMMUNITY): Payer: PPO

## 2018-05-09 ENCOUNTER — Encounter (HOSPITAL_COMMUNITY): Payer: Self-pay | Admitting: Emergency Medicine

## 2018-05-09 DIAGNOSIS — Z7984 Long term (current) use of oral hypoglycemic drugs: Secondary | ICD-10-CM | POA: Insufficient documentation

## 2018-05-09 DIAGNOSIS — S0990XA Unspecified injury of head, initial encounter: Secondary | ICD-10-CM | POA: Diagnosis not present

## 2018-05-09 DIAGNOSIS — S0083XA Contusion of other part of head, initial encounter: Secondary | ICD-10-CM | POA: Diagnosis not present

## 2018-05-09 DIAGNOSIS — Z79899 Other long term (current) drug therapy: Secondary | ICD-10-CM | POA: Diagnosis not present

## 2018-05-09 DIAGNOSIS — Y939 Activity, unspecified: Secondary | ICD-10-CM | POA: Insufficient documentation

## 2018-05-09 DIAGNOSIS — Z87891 Personal history of nicotine dependence: Secondary | ICD-10-CM | POA: Insufficient documentation

## 2018-05-09 DIAGNOSIS — I129 Hypertensive chronic kidney disease with stage 1 through stage 4 chronic kidney disease, or unspecified chronic kidney disease: Secondary | ICD-10-CM | POA: Diagnosis not present

## 2018-05-09 DIAGNOSIS — N184 Chronic kidney disease, stage 4 (severe): Secondary | ICD-10-CM | POA: Insufficient documentation

## 2018-05-09 DIAGNOSIS — Y999 Unspecified external cause status: Secondary | ICD-10-CM | POA: Insufficient documentation

## 2018-05-09 DIAGNOSIS — Y929 Unspecified place or not applicable: Secondary | ICD-10-CM | POA: Diagnosis not present

## 2018-05-09 DIAGNOSIS — E119 Type 2 diabetes mellitus without complications: Secondary | ICD-10-CM | POA: Insufficient documentation

## 2018-05-09 DIAGNOSIS — Z7982 Long term (current) use of aspirin: Secondary | ICD-10-CM | POA: Insufficient documentation

## 2018-05-09 DIAGNOSIS — W19XXXA Unspecified fall, initial encounter: Secondary | ICD-10-CM | POA: Insufficient documentation

## 2018-05-09 DIAGNOSIS — S199XXA Unspecified injury of neck, initial encounter: Secondary | ICD-10-CM | POA: Diagnosis not present

## 2018-05-09 NOTE — Discharge Instructions (Addendum)
Follow up with your md if any problems °

## 2018-05-09 NOTE — ED Triage Notes (Addendum)
Pt from [facility].  Pt fell last night in the bathroom, unable to call for help and crawl out of bathroom.  Hit head on bathroom sink,  Pt states "my head just doesn't feel right" pt states she doesn't know why she fell

## 2018-05-09 NOTE — ED Provider Notes (Signed)
[facility] EMERGENCY DEPARTMENT Provider Note   CSN: [id number] Arrival date & time: 05/09/18  0915     History   Chief Complaint Chief Complaint  Patient presents with  . Fall    HPI Nancy Medina is a 84 y.o. female.  Patient states that she fell last night hit her head but no loss of consciousness.  Patient complains of minimal discomfort to the right temporal area  The history is provided by the patient. No language interpreter was used.  Fall  This is a new problem. The current episode started 12 to 24 hours ago. The problem occurs rarely. The problem has been resolved. Pertinent negatives include no chest pain, no abdominal pain and no headaches. Nothing aggravates the symptoms. Nothing relieves the symptoms. She has tried nothing for the symptoms.    Past Medical History:  Diagnosis Date  . Achilles tendinitis   . Allergic rhinitis   . Anemia of chronic disease    at least back to 2010  . Anxiety   . Benign paroxysmal positional vertigo   . Cataract   . Cerebrovascular accident ([place])   . Chronic kidney disease (CKD), stage III (moderate) (HCC)   . Depression   . DJD (degenerative joint disease)   . DM (diabetes mellitus) ([place])    type II  . GERD (gastroesophageal reflux disease)   . HTN (hypertension)   . HX: breast cancer   . Hyperlipidemia   . Hypertonicity of bladder   . Knee pain, left   . Leg pain   . Lymphocytic colitis 2008   treated with Lialda short-term  . Obesity   . Osteoporosis, unspecified   . Renal disease    stage III  . Unspecified fall   . Unspecified glaucoma(365.9)     Patient Active Problem List   Diagnosis Date Noted  . Acute bronchitis due to Rhinovirus 03/20/2018  . Diabetic hyperosmolar non-ketotic state ([place]) 03/18/2018  . Acute respiratory failure with hypoxia ([place]) 03/18/2018  . CKD (chronic kidney disease) stage 4, GFR 15-29 ml/min (HCC) 03/18/2018  . AKI (acute kidney injury) ([place]) 02/09/2018  . Acute lower UTI  02/09/2018  . Nausea 02/09/2018  . Constipation 02/09/2018  . Diarrhea 11/25/2011  . Lymphocytic colitis 06/15/2011  . Esophageal dysphagia 06/15/2011  . UNSPECIFIED ANEMIA 04/27/2009  . DIASTOLIC DYSFUNCTION 92/42/6834  . ACHILLES TENDINITIS 10/27/2008  . PERIPHERAL EDEMA 10/14/2008  . ALLERGIC RHINITIS, SEASONAL 03/26/2008  . RENAL DISEASE, CHRONIC, STAGE III 03/26/2008  . DEGENERATIVE DISC DISEASE 01/15/2008  . Type 2 diabetes mellitus with hyperlipidemia ([place]) 12/13/2007  . HYPERLIPIDEMIA 10/11/2006  . OBESITY NOS 10/11/2006  . ANXIETY 10/11/2006  . DEPRESSION 10/11/2006  . GLAUCOMA NOS 10/11/2006  . BENIGN POSITIONAL VERTIGO 10/11/2006  . Essential hypertension 10/11/2006  . GERD 10/11/2006  . OVERACTIVE BLADDER 10/11/2006  . OSTEOPOROSIS 10/11/2006  . BREAST CANCER, HX OF 10/11/2006  . CEREBROVASCULAR ACCIDENT, HX OF 10/11/2006    Past Surgical History:  Procedure Laterality Date  . Bladder tack  2000  . BREAST LUMPECTOMY  2004   right  . cataracts    . CHOLECYSTECTOMY  1960  . COLONOSCOPY  07/2007   lymphocytic colitis  . ESOPHAGOGASTRODUODENOSCOPY  06/24/2011   Procedure: ESOPHAGOGASTRODUODENOSCOPY (EGD);  Surgeon: Dorothyann Peng, MD;  Location: [facility] ENDO SUITE;  Service: Endoscopy;  Laterality: N/A;.  Peptic stricture, mild erosive reflux esophagitis, hiatal  . FLEXIBLE SIGMOIDOSCOPY  11/25/2011   MODERATE DIVERTICULOSIS/INTERNAL HEMORRHOIDS  . S/P Hysterectomy  2000  benign reasons  . SAVORY DILATION  06/24/2011   Procedure: SAVORY DILATION;  Surgeon: Dorothyann Peng, MD;  Location: [facility] ENDO SUITE;  Service: Endoscopy;  Laterality: N/A;     OB History   None      Home Medications    Prior to Admission medications   Medication Sig Start Date End Date Taking? Authorizing Provider  aspirin 81 MG tablet Take 81 mg by mouth daily.     Yes [provider]  Cholecalciferol (VITAMIN D) 2000 units CAPS Take 1 capsule by mouth daily.   Yes [provider]  Cholecalciferol (VITAMIN D-1000 MAX ST) 1000 UNITS tablet Take 5,000 Units by mouth daily.    Yes [provider]  clonazePAM (KLONOPIN) 1 MG tablet Take 1 mg by mouth 3 (three) times daily as needed for anxiety. Take 1 tablet by mouth at bedtime for sleep.   Yes [provider]  ezetimibe (ZETIA) 10 MG tablet Take 1 tablet by mouth daily. 01/24/18  Yes [provider]  fish oil-omega-3 fatty acids 1000 MG capsule Take 1 g by mouth daily.    Yes [provider]  furosemide (LASIX) 20 MG tablet Take 1 tablet by mouth daily. 03/07/18  Yes [provider]  glimepiride (AMARYL) 1 MG tablet Take 1 tablet by mouth daily. 04/24/18  Yes [provider]  latanoprost (XALATAN) 0.005 % ophthalmic solution Place 1 drop into both eyes daily.  06/02/11  Yes [provider]  omeprazole (PRILOSEC) 40 MG capsule Take 1 capsule by mouth daily. 01/23/18  Yes [provider]  sitaGLIPtin (JANUVIA) 25 MG tablet Take 1 tablet (25 mg total) by mouth daily. 03/20/18  Yes Tat, Shanon Brow, MD  timolol (TIMOPTIC) 0.5 % ophthalmic solution Place 1 drop into both eyes 2 (two) times daily.  01/17/18  Yes [provider]  vitamin B-12 (CYANOCOBALAMIN) 1000 MCG tablet Take 1 tablet (1,000 mcg total) by mouth daily. 03/20/18  Yes Tat, Shanon Brow, MD  acetaminophen (TYLENOL) 500 MG tablet Take 500 mg by mouth every 6 (six) hours as needed for mild pain.    [provider]  loperamide (IMODIUM A-D) 2 MG tablet Take 2 mg by mouth 4 (four) times daily as needed for diarrhea or loose stools.    [provider]  meclizine (ANTIVERT) 25 MG tablet Take 25 mg by mouth 4 (four) times daily as needed for dizziness.    [provider]  predniSONE (DELTASONE) 10 MG tablet Take 5 tablets (50 mg total) by mouth daily with breakfast. And decrease by one tablet daily 03/21/18   Tat, Shanon Brow, MD  PROAIR HFA 108 [phone] Base) MCG/ACT inhaler Take 1-2 puffs  by mouth every 6 (six) hours as needed. 03/17/18   [provider]    Family History Family History  Problem Relation Age of Onset  . Colon cancer Neg Hx           . Liver disease Neg Hx           . GI problems Neg Hx             Social History Social History   Tobacco Use  . Smoking status: Former Smoker    Packs/day: 1.00    Types: Cigarettes  . Smokeless tobacco: Never Used  . Tobacco comment: 30 yrs ago  Substance Use Topics  . Alcohol use: No  . Drug use: No     Allergies   Metformin   Review of Systems  Review of Systems  Constitutional: Negative for appetite change and fatigue.  HENT: Negative for congestion, ear discharge and sinus pressure.        Headache  Eyes: Negative for discharge.  Respiratory: Negative for cough.   Cardiovascular: Negative for chest pain.  Gastrointestinal: Negative for abdominal pain and diarrhea.  Genitourinary: Negative for frequency and hematuria.  Musculoskeletal: Negative for back pain.  Skin: Negative for rash.  Neurological: Negative for seizures and headaches.  Psychiatric/Behavioral: Negative for hallucinations.     Physical Exam Updated Vital Signs BP (S) (!) 207/91 (BP Location: Left Arm) Comment: repeat x 2  Pulse 62   Temp 97.6 F (36.4 C) (Oral)   Resp 16   Ht 5\' 2"  (1.575 m)   Wt 73 kg (161 lb)   SpO2 97%   BMI 29.45 kg/m   Physical Exam  Constitutional: She is oriented to person, place, and time. She appears well-developed.  HENT:  Head: Normocephalic.  Minimal tenderness to right temple area, neck nontender  Eyes: Conjunctivae and EOM are normal. No scleral icterus.  Neck: Neck supple. No thyromegaly present.  Cardiovascular: Normal rate and regular rhythm. Exam reveals no gallop and no friction rub.  No murmur heard. Pulmonary/Chest: No stridor. She has no wheezes. She has no rales. She exhibits no tenderness.  Abdominal: She exhibits no distension. There is no tenderness. There is no  rebound.  Musculoskeletal: Normal range of motion. She exhibits no edema.  Lymphadenopathy:    She has no cervical adenopathy.  Neurological: She is oriented to person, place, and time. She exhibits normal muscle tone. Coordination normal.  Skin: No rash noted. No erythema.  Psychiatric: She has a normal mood and affect. Her behavior is normal.     ED Treatments / Results  Labs (all labs ordered are listed, but only abnormal results are displayed) Labs Reviewed - No data to display  EKG None  Radiology Ct Head Wo Contrast  Result Date: 05/09/2018 CLINICAL DATA:  [place] last night in bathroom striking head on sink, was unable to call for help, uncertain as to why she fell, per patient "my head does not feel RIGHT" EXAM: CT HEAD WITHOUT CONTRAST CT CERVICAL SPINE WITHOUT CONTRAST TECHNIQUE: Multidetector CT imaging of the head and cervical spine was performed following the standard protocol without intravenous contrast. Multiplanar CT image reconstructions of the cervical spine were also generated. COMPARISON:  03/18/2018 FINDINGS: CT HEAD FINDINGS Brain: Generalized atrophy. Minimally prominent ventricular system stable. No midline shift or mass effect. Extensive small vessel chronic ischemic changes of deep cerebral white matter. Old LEFT occipital infarct with encephalomalacia. Small old infarcts in BILATERAL cerebellar hemispheres. No intracranial hemorrhage, mass lesion or evidence acute infarction. No extra-axial fluid collections. Vascular: Mild atherosclerotic calcifications of internal carotid arteries at skull base. Skull: Demineralized but intact Sinuses/Orbits: Visualized paranasal sinuses and mastoid air cells clear Other: N/A CT CERVICAL SPINE FINDINGS Alignment: Normal Skull base and vertebrae: Diffuse osseous demineralization. Visualized skull base intact. Vertebral body heights maintained. Multilevel disc space narrowing and minimal endplate spur formation. Multilevel facet  degenerative changes greater on RIGHT. No fracture, subluxation, or bone destruction. Soft tissues and spinal canal: Prevertebral soft tissues normal thickness. Soft tissues otherwise unremarkable. Atherosclerotic calcifications noted at the carotid arteries and cranial aspect of aortic arch. Disc levels:  No additional abnormalities Upper chest: Lung apices clear Other: N/A IMPRESSION: Atrophy with small vessel chronic ischemic changes of deep cerebral white matter. Old LEFT occipital and small BILATERAL cerebellar infarcts. No  acute intracranial abnormalities. Degenerative disc and facet disease changes of the cervical spine. No acute cervical spine abnormalities. Aortic Atherosclerosis (ICD10-I70.0). Electronically Signed   By: Lavonia Dana M.D.   On: 05/09/2018 10:11   Ct Cervical Spine Wo Contrast  Result Date: 05/09/2018 CLINICAL DATA:  [place] last night in bathroom striking head on sink, was unable to call for help, uncertain as to why she fell, per patient "my head does not feel RIGHT" EXAM: CT HEAD WITHOUT CONTRAST CT CERVICAL SPINE WITHOUT CONTRAST TECHNIQUE: Multidetector CT imaging of the head and cervical spine was performed following the standard protocol without intravenous contrast. Multiplanar CT image reconstructions of the cervical spine were also generated. COMPARISON:  03/18/2018 FINDINGS: CT HEAD FINDINGS Brain: Generalized atrophy. Minimally prominent ventricular system stable. No midline shift or mass effect. Extensive small vessel chronic ischemic changes of deep cerebral white matter. Old LEFT occipital infarct with encephalomalacia. Small old infarcts in BILATERAL cerebellar hemispheres. No intracranial hemorrhage, mass lesion or evidence acute infarction. No extra-axial fluid collections. Vascular: Mild atherosclerotic calcifications of internal carotid arteries at skull base. Skull: Demineralized but intact Sinuses/Orbits: Visualized paranasal sinuses and mastoid air cells clear Other:  N/A CT CERVICAL SPINE FINDINGS Alignment: Normal Skull base and vertebrae: Diffuse osseous demineralization. Visualized skull base intact. Vertebral body heights maintained. Multilevel disc space narrowing and minimal endplate spur formation. Multilevel facet degenerative changes greater on RIGHT. No fracture, subluxation, or bone destruction. Soft tissues and spinal canal: Prevertebral soft tissues normal thickness. Soft tissues otherwise unremarkable. Atherosclerotic calcifications noted at the carotid arteries and cranial aspect of aortic arch. Disc levels:  No additional abnormalities Upper chest: Lung apices clear Other: N/A IMPRESSION: Atrophy with small vessel chronic ischemic changes of deep cerebral white matter. Old LEFT occipital and small BILATERAL cerebellar infarcts. No acute intracranial abnormalities. Degenerative disc and facet disease changes of the cervical spine. No acute cervical spine abnormalities. Aortic Atherosclerosis (ICD10-I70.0). Electronically Signed   By: Lavonia Dana M.D.   On: 05/09/2018 10:11    Procedures Procedures (including critical care time)  Medications Ordered in ED Medications - No data to display   Initial Impression / Assessment and Plan / ED Course  I have reviewed the triage vital signs and the nursing notes.  Pertinent labs & imaging results that were available during my care of the patient were reviewed by me and considered in my medical decision making (see chart for details).     She with a fall with contusion to forehead.  CT scan negative.  She will follow-up with her PCP  Final Clinical Impressions(s) / ED Diagnoses   Final diagnoses:  Fall, initial encounter    ED Discharge Orders    None       Milton Ferguson, MD 05/09/18 1028

## 2018-05-09 NOTE — ED Notes (Signed)
Pt returned from ct

## 2018-05-14 ENCOUNTER — Other Ambulatory Visit (HOSPITAL_COMMUNITY): Payer: Self-pay | Admitting: Nurse Practitioner

## 2018-05-14 ENCOUNTER — Ambulatory Visit (HOSPITAL_COMMUNITY)
Admission: RE | Admit: 2018-05-14 | Discharge: 2018-05-14 | Disposition: A | Discharge status: Home / Self Care | Payer: PPO | Source: Ambulatory Visit | Attending: Nurse Practitioner | Admitting: Nurse Practitioner

## 2018-05-14 DIAGNOSIS — E559 Vitamin D deficiency, unspecified: Secondary | ICD-10-CM | POA: Diagnosis not present

## 2018-05-14 DIAGNOSIS — I700 Atherosclerosis of aorta: Secondary | ICD-10-CM | POA: Insufficient documentation

## 2018-05-14 DIAGNOSIS — R0602 Shortness of breath: Secondary | ICD-10-CM

## 2018-05-14 DIAGNOSIS — R7989 Other specified abnormal findings of blood chemistry: Secondary | ICD-10-CM | POA: Diagnosis not present

## 2018-05-14 DIAGNOSIS — E118 Type 2 diabetes mellitus with unspecified complications: Secondary | ICD-10-CM | POA: Diagnosis not present

## 2018-05-14 DIAGNOSIS — E119 Type 2 diabetes mellitus without complications: Secondary | ICD-10-CM | POA: Diagnosis not present

## 2018-05-14 DIAGNOSIS — R918 Other nonspecific abnormal finding of lung field: Secondary | ICD-10-CM | POA: Insufficient documentation

## 2018-05-16 ENCOUNTER — Encounter (HOSPITAL_COMMUNITY): Payer: Self-pay

## 2018-05-16 ENCOUNTER — Other Ambulatory Visit (HOSPITAL_COMMUNITY): Payer: Self-pay | Admitting: Family Medicine

## 2018-05-16 ENCOUNTER — Encounter (HOSPITAL_COMMUNITY)
Admission: RE | Admit: 2018-05-16 | Discharge: 2018-05-16 | Disposition: A | Discharge status: Home / Self Care | Payer: PPO | Source: Ambulatory Visit | Attending: Family Medicine | Admitting: Family Medicine

## 2018-05-16 ENCOUNTER — Ambulatory Visit (HOSPITAL_COMMUNITY)
Admission: RE | Admit: 2018-05-16 | Discharge: 2018-05-16 | Disposition: A | Discharge status: Home / Self Care | Payer: PPO | Source: Ambulatory Visit | Attending: Family Medicine | Admitting: Family Medicine

## 2018-05-16 DIAGNOSIS — R0602 Shortness of breath: Secondary | ICD-10-CM

## 2018-05-16 DIAGNOSIS — R0600 Dyspnea, unspecified: Secondary | ICD-10-CM | POA: Diagnosis not present

## 2018-05-16 DIAGNOSIS — I2699 Other pulmonary embolism without acute cor pulmonale: Secondary | ICD-10-CM | POA: Insufficient documentation

## 2018-05-16 DIAGNOSIS — J90 Pleural effusion, not elsewhere classified: Secondary | ICD-10-CM | POA: Diagnosis not present

## 2018-05-16 LAB — POCT I-STAT CREATININE: Creatinine, Ser: 1.6 mg/dL — ABNORMAL HIGH (ref 0.44–1.00)

## 2018-05-16 MED ORDER — TECHNETIUM TO 99M ALBUMIN AGGREGATED
4.0000 | Freq: Once | INTRAVENOUS | Status: AC | PRN
  Administered 2018-05-16: 4.4 via INTRAVENOUS

## 2018-05-16 MED ORDER — IOPAMIDOL (ISOVUE-370) INJECTION 76%: 100.0000 mL | Freq: Once | INTRAVENOUS | Status: DC | PRN

## 2018-05-16 MED ORDER — TECHNETIUM TC 99M DIETHYLENETRIAME-PENTAACETIC ACID
30.0000 | Freq: Once | INTRAVENOUS | Status: AC | PRN
  Administered 2018-05-16: 32 via RESPIRATORY_TRACT

## 2018-05-18 ENCOUNTER — Other Ambulatory Visit (HOSPITAL_COMMUNITY): Payer: Self-pay | Admitting: Family Medicine

## 2018-05-18 ENCOUNTER — Other Ambulatory Visit (HOSPITAL_COMMUNITY)
Admission: RE | Admit: 2018-05-18 | Discharge: 2018-05-18 | Disposition: A | Discharge status: Home / Self Care | Payer: PPO | Source: Ambulatory Visit | Attending: Family Medicine | Admitting: Family Medicine

## 2018-05-18 ENCOUNTER — Ambulatory Visit (HOSPITAL_COMMUNITY)
Admission: RE | Admit: 2018-05-18 | Discharge: 2018-05-18 | Disposition: A | Discharge status: Home / Self Care | Payer: PPO | Source: Ambulatory Visit | Attending: Family Medicine | Admitting: Family Medicine

## 2018-05-18 DIAGNOSIS — M79605 Pain in left leg: Principal | ICD-10-CM

## 2018-05-18 DIAGNOSIS — N390 Urinary tract infection, site not specified: Secondary | ICD-10-CM | POA: Diagnosis not present

## 2018-05-18 DIAGNOSIS — M79604 Pain in right leg: Secondary | ICD-10-CM | POA: Insufficient documentation

## 2018-05-18 DIAGNOSIS — I10 Essential (primary) hypertension: Secondary | ICD-10-CM | POA: Diagnosis not present

## 2018-05-18 DIAGNOSIS — Z9181 History of falling: Secondary | ICD-10-CM | POA: Diagnosis not present

## 2018-05-18 DIAGNOSIS — R791 Abnormal coagulation profile: Secondary | ICD-10-CM | POA: Diagnosis not present

## 2018-05-18 DIAGNOSIS — R600 Localized edema: Secondary | ICD-10-CM | POA: Diagnosis not present

## 2018-05-18 DIAGNOSIS — R5381 Other malaise: Secondary | ICD-10-CM | POA: Diagnosis not present

## 2018-05-18 DIAGNOSIS — R0602 Shortness of breath: Secondary | ICD-10-CM | POA: Diagnosis present

## 2018-05-18 LAB — CBC
HEMATOCRIT: 32.1 % — AB (ref 36.0–46.0)
HEMOGLOBIN: 10.1 g/dL — AB (ref 12.0–15.0)
MCH: 28.4 pg (ref 26.0–34.0)
MCHC: 31.5 g/dL (ref 30.0–36.0)
MCV: 90.2 fL (ref 78.0–100.0)
Platelets: 264 10*3/uL (ref 150–400)
RBC: 3.56 MIL/uL — ABNORMAL LOW (ref 3.87–5.11)
RDW: 13.3 % (ref 11.5–15.5)
WBC: 7.3 10*3/uL (ref 4.0–10.5)

## 2018-05-18 LAB — TSH: TSH: 2.311 u[IU]/mL (ref 0.350–4.500)

## 2018-05-18 LAB — BRAIN NATRIURETIC PEPTIDE: B Natriuretic Peptide: 329 pg/mL — ABNORMAL HIGH (ref 0.0–100.0)

## 2018-05-18 LAB — D-DIMER, QUANTITATIVE: D-Dimer, Quant: 3.71 ug/mL-FEU — ABNORMAL HIGH (ref 0.00–0.50)

## 2018-05-21 DIAGNOSIS — Z9181 History of falling: Secondary | ICD-10-CM | POA: Diagnosis not present

## 2018-05-21 DIAGNOSIS — R4189 Other symptoms and signs involving cognitive functions and awareness: Secondary | ICD-10-CM | POA: Diagnosis not present

## 2018-05-21 DIAGNOSIS — R791 Abnormal coagulation profile: Secondary | ICD-10-CM | POA: Diagnosis not present

## 2018-05-21 DIAGNOSIS — R531 Weakness: Secondary | ICD-10-CM | POA: Diagnosis not present

## 2018-05-22 ENCOUNTER — Other Ambulatory Visit (HOSPITAL_COMMUNITY): Payer: Self-pay | Admitting: Family Medicine

## 2018-05-22 DIAGNOSIS — R262 Difficulty in walking, not elsewhere classified: Secondary | ICD-10-CM | POA: Diagnosis not present

## 2018-05-22 DIAGNOSIS — Z7984 Long term (current) use of oral hypoglycemic drugs: Secondary | ICD-10-CM | POA: Diagnosis not present

## 2018-05-22 DIAGNOSIS — Z7982 Long term (current) use of aspirin: Secondary | ICD-10-CM | POA: Diagnosis not present

## 2018-05-22 DIAGNOSIS — H409 Unspecified glaucoma: Secondary | ICD-10-CM | POA: Diagnosis not present

## 2018-05-22 DIAGNOSIS — M810 Age-related osteoporosis without current pathological fracture: Secondary | ICD-10-CM | POA: Diagnosis not present

## 2018-05-22 DIAGNOSIS — Z87440 Personal history of urinary (tract) infections: Secondary | ICD-10-CM | POA: Diagnosis not present

## 2018-05-22 DIAGNOSIS — M6281 Muscle weakness (generalized): Secondary | ICD-10-CM | POA: Diagnosis not present

## 2018-05-22 DIAGNOSIS — Z853 Personal history of malignant neoplasm of breast: Secondary | ICD-10-CM | POA: Diagnosis not present

## 2018-05-22 DIAGNOSIS — R296 Repeated falls: Secondary | ICD-10-CM | POA: Diagnosis not present

## 2018-05-22 DIAGNOSIS — Z9181 History of falling: Secondary | ICD-10-CM | POA: Diagnosis not present

## 2018-05-22 DIAGNOSIS — R4189 Other symptoms and signs involving cognitive functions and awareness: Secondary | ICD-10-CM

## 2018-05-22 DIAGNOSIS — N183 Chronic kidney disease, stage 3 (moderate): Secondary | ICD-10-CM | POA: Diagnosis not present

## 2018-05-22 DIAGNOSIS — I69398 Other sequelae of cerebral infarction: Secondary | ICD-10-CM | POA: Diagnosis not present

## 2018-05-22 DIAGNOSIS — E1122 Type 2 diabetes mellitus with diabetic chronic kidney disease: Secondary | ICD-10-CM | POA: Diagnosis not present

## 2018-05-22 DIAGNOSIS — I5032 Chronic diastolic (congestive) heart failure: Secondary | ICD-10-CM | POA: Diagnosis not present

## 2018-05-22 DIAGNOSIS — M5136 Other intervertebral disc degeneration, lumbar region: Secondary | ICD-10-CM | POA: Diagnosis not present

## 2018-05-22 DIAGNOSIS — I1310 Hypertensive heart and chronic kidney disease without heart failure, with stage 1 through stage 4 chronic kidney disease, or unspecified chronic kidney disease: Secondary | ICD-10-CM | POA: Diagnosis not present

## 2018-05-29 ENCOUNTER — Ambulatory Visit (HOSPITAL_COMMUNITY)
Admission: RE | Admit: 2018-05-29 | Discharge: 2018-05-29 | Disposition: A | Discharge status: Home / Self Care | Payer: PPO | Source: Ambulatory Visit | Attending: Family Medicine | Admitting: Family Medicine

## 2018-05-29 DIAGNOSIS — R4189 Other symptoms and signs involving cognitive functions and awareness: Secondary | ICD-10-CM | POA: Insufficient documentation

## 2018-05-29 DIAGNOSIS — I6782 Cerebral ischemia: Secondary | ICD-10-CM | POA: Diagnosis not present

## 2018-05-29 DIAGNOSIS — I63412 Cerebral infarction due to embolism of left middle cerebral artery: Secondary | ICD-10-CM | POA: Diagnosis not present

## 2018-05-29 DIAGNOSIS — G319 Degenerative disease of nervous system, unspecified: Secondary | ICD-10-CM | POA: Diagnosis not present

## 2018-06-01 DIAGNOSIS — K5900 Constipation, unspecified: Secondary | ICD-10-CM | POA: Diagnosis not present

## 2018-06-01 DIAGNOSIS — R4181 Age-related cognitive decline: Secondary | ICD-10-CM | POA: Diagnosis not present

## 2018-06-14 DIAGNOSIS — M79675 Pain in left toe(s): Secondary | ICD-10-CM | POA: Diagnosis not present

## 2018-06-14 DIAGNOSIS — B351 Tinea unguium: Secondary | ICD-10-CM | POA: Diagnosis not present

## 2018-06-14 DIAGNOSIS — M79674 Pain in right toe(s): Secondary | ICD-10-CM | POA: Diagnosis not present

## 2018-06-21 DIAGNOSIS — R262 Difficulty in walking, not elsewhere classified: Secondary | ICD-10-CM | POA: Diagnosis not present

## 2018-06-21 DIAGNOSIS — R296 Repeated falls: Secondary | ICD-10-CM | POA: Diagnosis not present

## 2018-06-21 DIAGNOSIS — Z853 Personal history of malignant neoplasm of breast: Secondary | ICD-10-CM | POA: Diagnosis not present

## 2018-06-21 DIAGNOSIS — I1310 Hypertensive heart and chronic kidney disease without heart failure, with stage 1 through stage 4 chronic kidney disease, or unspecified chronic kidney disease: Secondary | ICD-10-CM | POA: Diagnosis not present

## 2018-06-21 DIAGNOSIS — E1122 Type 2 diabetes mellitus with diabetic chronic kidney disease: Secondary | ICD-10-CM | POA: Diagnosis not present

## 2018-06-21 DIAGNOSIS — Z9181 History of falling: Secondary | ICD-10-CM | POA: Diagnosis not present

## 2018-06-21 DIAGNOSIS — Z7982 Long term (current) use of aspirin: Secondary | ICD-10-CM | POA: Diagnosis not present

## 2018-06-21 DIAGNOSIS — Z87440 Personal history of urinary (tract) infections: Secondary | ICD-10-CM | POA: Diagnosis not present

## 2018-06-21 DIAGNOSIS — I69398 Other sequelae of cerebral infarction: Secondary | ICD-10-CM | POA: Diagnosis not present

## 2018-06-21 DIAGNOSIS — N183 Chronic kidney disease, stage 3 (moderate): Secondary | ICD-10-CM | POA: Diagnosis not present

## 2018-06-21 DIAGNOSIS — Z7984 Long term (current) use of oral hypoglycemic drugs: Secondary | ICD-10-CM | POA: Diagnosis not present

## 2018-06-21 DIAGNOSIS — M810 Age-related osteoporosis without current pathological fracture: Secondary | ICD-10-CM | POA: Diagnosis not present

## 2018-06-21 DIAGNOSIS — M5136 Other intervertebral disc degeneration, lumbar region: Secondary | ICD-10-CM | POA: Diagnosis not present

## 2018-06-21 DIAGNOSIS — M6281 Muscle weakness (generalized): Secondary | ICD-10-CM | POA: Diagnosis not present

## 2018-06-21 DIAGNOSIS — I5032 Chronic diastolic (congestive) heart failure: Secondary | ICD-10-CM | POA: Diagnosis not present

## 2018-06-21 DIAGNOSIS — H409 Unspecified glaucoma: Secondary | ICD-10-CM | POA: Diagnosis not present

## 2018-06-26 ENCOUNTER — Other Ambulatory Visit: Payer: Self-pay

## 2018-06-26 ENCOUNTER — Emergency Department (HOSPITAL_COMMUNITY): Payer: PPO

## 2018-06-26 ENCOUNTER — Emergency Department (HOSPITAL_COMMUNITY)
Admission: EM | Admit: 2018-06-26 | Discharge: 2018-06-26 | Disposition: A | Discharge status: Home / Self Care | Payer: PPO | Attending: Emergency Medicine | Admitting: Emergency Medicine

## 2018-06-26 ENCOUNTER — Encounter (HOSPITAL_COMMUNITY): Payer: Self-pay | Admitting: Emergency Medicine

## 2018-06-26 DIAGNOSIS — Z9049 Acquired absence of other specified parts of digestive tract: Secondary | ICD-10-CM | POA: Diagnosis not present

## 2018-06-26 DIAGNOSIS — Z7984 Long term (current) use of oral hypoglycemic drugs: Secondary | ICD-10-CM | POA: Insufficient documentation

## 2018-06-26 DIAGNOSIS — F329 Major depressive disorder, single episode, unspecified: Secondary | ICD-10-CM | POA: Diagnosis not present

## 2018-06-26 DIAGNOSIS — E1122 Type 2 diabetes mellitus with diabetic chronic kidney disease: Secondary | ICD-10-CM | POA: Insufficient documentation

## 2018-06-26 DIAGNOSIS — N184 Chronic kidney disease, stage 4 (severe): Secondary | ICD-10-CM | POA: Diagnosis not present

## 2018-06-26 DIAGNOSIS — Z853 Personal history of malignant neoplasm of breast: Secondary | ICD-10-CM | POA: Diagnosis not present

## 2018-06-26 DIAGNOSIS — R410 Disorientation, unspecified: Secondary | ICD-10-CM

## 2018-06-26 DIAGNOSIS — Z87891 Personal history of nicotine dependence: Secondary | ICD-10-CM | POA: Diagnosis not present

## 2018-06-26 DIAGNOSIS — Z8673 Personal history of transient ischemic attack (TIA), and cerebral infarction without residual deficits: Secondary | ICD-10-CM | POA: Insufficient documentation

## 2018-06-26 DIAGNOSIS — R29810 Facial weakness: Secondary | ICD-10-CM | POA: Diagnosis not present

## 2018-06-26 DIAGNOSIS — I10 Essential (primary) hypertension: Secondary | ICD-10-CM

## 2018-06-26 DIAGNOSIS — I129 Hypertensive chronic kidney disease with stage 1 through stage 4 chronic kidney disease, or unspecified chronic kidney disease: Secondary | ICD-10-CM | POA: Insufficient documentation

## 2018-06-26 DIAGNOSIS — F419 Anxiety disorder, unspecified: Secondary | ICD-10-CM | POA: Insufficient documentation

## 2018-06-26 DIAGNOSIS — J9809 Other diseases of bronchus, not elsewhere classified: Secondary | ICD-10-CM | POA: Diagnosis not present

## 2018-06-26 DIAGNOSIS — G4751 Confusional arousals: Secondary | ICD-10-CM | POA: Diagnosis present

## 2018-06-26 LAB — COMPREHENSIVE METABOLIC PANEL
ALT: 8 U/L (ref 0–44)
ANION GAP: 8 (ref 5–15)
AST: 14 U/L — AB (ref 15–41)
Albumin: 3.6 g/dL (ref 3.5–5.0)
Alkaline Phosphatase: 60 U/L (ref 38–126)
BUN: 28 mg/dL — AB (ref 8–23)
CHLORIDE: 103 mmol/L (ref 98–111)
CO2: 27 mmol/L (ref 22–32)
Calcium: 9 mg/dL (ref 8.9–10.3)
Creatinine, Ser: 1.61 mg/dL — ABNORMAL HIGH (ref 0.44–1.00)
GFR calc Af Amer: 31 mL/min — ABNORMAL LOW (ref >60)
GFR calc non Af Amer: 27 mL/min — ABNORMAL LOW (ref >60)
Glucose, Bld: 149 mg/dL — ABNORMAL HIGH (ref 70–99)
POTASSIUM: 3.1 mmol/L — AB (ref 3.5–5.1)
Sodium: 138 mmol/L (ref 135–145)
TOTAL PROTEIN: 6.9 g/dL (ref 6.5–8.1)
Total Bilirubin: 0.6 mg/dL (ref 0.3–1.2)

## 2018-06-26 LAB — CBC WITH DIFFERENTIAL/PLATELET
Basophils Absolute: 0 10*3/uL (ref 0.0–0.1)
Basophils Relative: 0 %
Eosinophils Absolute: 0.3 10*3/uL (ref 0.0–0.7)
Eosinophils Relative: 5 %
HEMATOCRIT: 34.1 % — AB (ref 36.0–46.0)
HEMOGLOBIN: 10.6 g/dL — AB (ref 12.0–15.0)
LYMPHS ABS: 1.9 10*3/uL (ref 0.7–4.0)
LYMPHS PCT: 30 %
MCH: 27.7 pg (ref 26.0–34.0)
MCHC: 31.1 g/dL (ref 30.0–36.0)
MCV: 89 fL (ref 78.0–100.0)
Monocytes Absolute: 0.6 10*3/uL (ref 0.1–1.0)
Monocytes Relative: 10 %
NEUTROS ABS: 3.6 10*3/uL (ref 1.7–7.7)
NEUTROS PCT: 55 %
PLATELETS: 237 10*3/uL (ref 150–400)
RBC: 3.83 MIL/uL — AB (ref 3.87–5.11)
RDW: 14.3 % (ref 11.5–15.5)
WBC: 6.4 10*3/uL (ref 4.0–10.5)

## 2018-06-26 LAB — URINALYSIS, ROUTINE W REFLEX MICROSCOPIC
BILIRUBIN URINE: NEGATIVE
Glucose, UA: NEGATIVE mg/dL
KETONES UR: NEGATIVE mg/dL
LEUKOCYTES UA: NEGATIVE
Nitrite: NEGATIVE
PH: 5 (ref 5.0–8.0)
PROTEIN: 100 mg/dL — AB
Specific Gravity, Urine: 1.016 (ref 1.005–1.030)

## 2018-06-26 MED ORDER — POTASSIUM CHLORIDE CRYS ER 20 MEQ PO TBCR
40.0000 meq | EXTENDED_RELEASE_TABLET | Freq: Once | ORAL | Status: AC
  Administered 2018-06-26: 40 meq via ORAL
  Filled 2018-06-26: qty 2

## 2018-06-26 MED ORDER — LACTATED RINGERS IV BOLUS
1000.0000 mL | Freq: Once | INTRAVENOUS | Status: AC
  Administered 2018-06-26: 1000 mL via INTRAVENOUS

## 2018-06-26 NOTE — ED Notes (Signed)
[employer] and spoke with Crystal who states "she's been confused all morning and unable to walk good and she's been leaning to the left." States patient woke with these symptoms and last known well was 2100 last night.

## 2018-06-26 NOTE — ED Notes (Signed)
Pt ambulated around room, pt ambulated slowly but steady. States she's not short of breath but that her right leg bothers her while walking.

## 2018-06-26 NOTE — ED Triage Notes (Addendum)
Patient from [facility] with complaint of confusion since this morning. Daughter states "she's bearing to the left when she walks." Patient states she does not know why she's here. She is alert and oriented at triage. No facial droop, equal strength bilaterally. Patient has no complaints at triage.

## 2018-06-26 NOTE — ED Provider Notes (Signed)
Emergency Department Provider Note   I have reviewed the triage vital signs and the nursing notes.   HISTORY  Chief Complaint Altered Mental Status   HPI Nancy Medina is a 84 y.o. female who presents from her facility secondary to confusion and being off balance when she walks.  Patient states she feels fine and is standing strong.  Her daughter is here with her, Pam, who also states that she does not see the patient is any different than normal as far as the confusion standpoint.  She does have some memory issues but this seems to be at her baseline per daughter.  What is abnormal is that the patient seems to have difficulty walking and keeps falling to the left.  She had MRI done about a month ago that showed no evidence of acute stroke.  She does not remember this new episode of difficulty walking started but she thinks that she was normal yesterday.  No urinary symptoms.  She has constipation and she has not had a bowel movement since yesterday.  No diarrhea or vomiting or nausea.  No headaches or vision changes.  No fevers or recent illnesses. No other associated or modifying symptoms.    Past Medical History:  Diagnosis Date  . Achilles tendinitis   . Allergic rhinitis   . Anemia of chronic disease    at least back to 2010  . Anxiety   . Benign paroxysmal positional vertigo   . Cataract   . Cerebrovascular accident ([place])   . Chronic kidney disease (CKD), stage III (moderate) (HCC)   . Depression   . DJD (degenerative joint disease)   . DM (diabetes mellitus) ([place])    type II  . GERD (gastroesophageal reflux disease)   . HTN (hypertension)   . HX: breast cancer   . Hyperlipidemia   . Hypertonicity of bladder   . Knee pain, left   . Leg pain   . Lymphocytic colitis 2008   treated with Lialda short-term  . Obesity   . Osteoporosis, unspecified   . Renal disease    stage III  . Unspecified fall   . Unspecified glaucoma(365.9)     Patient Active Problem List     Diagnosis Date Noted  . Acute bronchitis due to Rhinovirus 03/20/2018  . Diabetic hyperosmolar non-ketotic state ([place]) 03/18/2018  . Acute respiratory failure with hypoxia ([place]) 03/18/2018  . CKD (chronic kidney disease) stage 4, GFR 15-29 ml/min (HCC) 03/18/2018  . AKI (acute kidney injury) ([place]) 02/09/2018  . Acute lower UTI 02/09/2018  . Nausea 02/09/2018  . Constipation 02/09/2018  . Diarrhea 11/25/2011  . Lymphocytic colitis 06/15/2011  . Esophageal dysphagia 06/15/2011  . UNSPECIFIED ANEMIA 04/27/2009  . DIASTOLIC DYSFUNCTION 14/43/1540  . ACHILLES TENDINITIS 10/27/2008  . PERIPHERAL EDEMA 10/14/2008  . ALLERGIC RHINITIS, SEASONAL 03/26/2008  . RENAL DISEASE, CHRONIC, STAGE III 03/26/2008  . DEGENERATIVE DISC DISEASE 01/15/2008  . Type 2 diabetes mellitus with hyperlipidemia ([place]) 12/13/2007  . HYPERLIPIDEMIA 10/11/2006  . OBESITY NOS 10/11/2006  . ANXIETY 10/11/2006  . DEPRESSION 10/11/2006  . GLAUCOMA NOS 10/11/2006  . BENIGN POSITIONAL VERTIGO 10/11/2006  . Essential hypertension 10/11/2006  . GERD 10/11/2006  . OVERACTIVE BLADDER 10/11/2006  . OSTEOPOROSIS 10/11/2006  . BREAST CANCER, HX OF 10/11/2006  . CEREBROVASCULAR ACCIDENT, HX OF 10/11/2006    Past Surgical History:  Procedure Laterality Date  . Bladder tack  2000  . BREAST LUMPECTOMY  2004   right  . cataracts    .  CHOLECYSTECTOMY  1960  . COLONOSCOPY  07/2007   lymphocytic colitis  . ESOPHAGOGASTRODUODENOSCOPY  06/24/2011   Procedure: ESOPHAGOGASTRODUODENOSCOPY (EGD);  Surgeon: Dorothyann Peng, MD;  [facility];  Service: Endoscopy;  Laterality: N/A;.  Peptic stricture, mild erosive reflux esophagitis, hiatal  . FLEXIBLE SIGMOIDOSCOPY  11/25/2011   MODERATE DIVERTICULOSIS/INTERNAL HEMORRHOIDS  . S/P Hysterectomy  2000   benign reasons  . SAVORY DILATION  06/24/2011   Procedure: SAVORY DILATION;  Surgeon: Dorothyann Peng, MD;  Location: [facility];  Service: Endoscopy;  Laterality: N/A;     Current Outpatient Rx  . Order #: [id number] Class: Historical Med  . Order #: [id number] Class: Historical Med  . Order #: [id number] Class: Historical Med  . Order #: [id number] Class: Historical Med  . Order #: [id number] Class: Historical Med  . Order #: [id number] Class: Historical Med  . Order #: [id number] Class: Historical Med  . Order #: [id number] Class: Historical Med  . Order #: [id number] Class: Historical Med  . Order #: [id number] Class: Historical Med  . Order #: [id number] Class: Historical Med  . Order #: [id number] Class: Historical Med  . Order #: [id number] Class: Historical Med  . Order #: [id number] Class: Historical Med  . Order #: [id number] Class: Print  . Order #: [id number] Class: Historical Med  . Order #: [id number] Class: Print    Allergies Metformin  Family History  Problem Relation Age of Onset  . Colon cancer Neg Hx           . Liver disease Neg Hx           . GI problems Neg Hx             Social History Social History   Tobacco Use  . Smoking status: Former Smoker    Packs/day: 1.00    Types: Cigarettes  . Smokeless tobacco: Never Used  . Tobacco comment: 30 yrs ago  Substance Use Topics  . Alcohol use: No  . Drug use: No    Review of Systems  All other systems negative except as documented in the HPI. All pertinent positives and negatives as reviewed in the HPI. ____________________________________________   PHYSICAL EXAM:  VITAL SIGNS: ED Triage Vitals  Enc Vitals Group     BP 06/26/18 1319 (!) 217/79     Pulse Rate 06/26/18 1319 72     Resp 06/26/18 1319 16     Temp 06/26/18 1319 97.8 F (36.6 C)     Temp Source 06/26/18 1319 Oral     SpO2 06/26/18 1319 94 %     Weight 06/26/18 1319 160 lb (72.6 kg)     Height 06/26/18 1319 5\' 4"  (1.626 m)     Head Circumference --      Peak Flow --      Pain Score 06/26/18 1323 0     Pain Loc --      Pain Edu? --      Excl. in [place]? --     Constitutional: Alert and oriented. Well appearing and in no acute  distress. Eyes: Conjunctivae are normal. PERRL. EOMI. Head: Atraumatic. Nose: No congestion/rhinnorhea. Mouth/Throat: Mucous membranes are moist.  Oropharynx non-erythematous. Neck: No stridor.  No meningeal signs.   Cardiovascular: Normal rate, regular rhythm. Good peripheral circulation. Grossly normal heart sounds.   Respiratory: Normal respiratory effort.  No retractions. Lungs CTAB. Gastrointestinal: Soft and nontender. No distention.  Musculoskeletal: No lower extremity tenderness nor edema. No gross deformities of extremities. Neurologic:  Normal speech and language. No gross focal neurologic deficits are appreciated.  No altered mental status, able to give full seemingly accurate history.  Face is symmetric, EOM's intact, pupils equal and reactive, vision intact, tongue and uvula midline without deviation. Upper and Lower extremity motor 5/5, intact pain perception in distal extremities, 2+ reflexes in biceps, patella and achilles tendons. Able to perform finger to nose normal with both hands. Walks without assistance or evident ataxia.  Skin:  Skin is warm, dry and intact. No rash noted.  ____________________________________________   LABS (all labs ordered are listed, but only abnormal results are displayed)  Labs Reviewed  COMPREHENSIVE METABOLIC PANEL - Abnormal; Notable for the following components:      Result Value   Potassium 3.1 (*)    Glucose, Bld 149 (*)    BUN 28 (*)    Creatinine, Ser 1.61 (*)    AST 14 (*)    GFR calc non Af Amer 27 (*)    GFR calc Af Amer 31 (*)    All other components within normal limits  CBC WITH DIFFERENTIAL/PLATELET - Abnormal; Notable for the following components:   RBC 3.83 (*)    Hemoglobin 10.6 (*)    HCT 34.1 (*)    All other components within normal limits  URINALYSIS, ROUTINE W REFLEX MICROSCOPIC - Abnormal; Notable for the following components:   APPearance HAZY (*)    Hgb urine dipstick SMALL (*)    Protein, ur 100 (*)     Bacteria, UA RARE (*)    All other components within normal limits   ____________________________________________  EKG   EKG Interpretation  Date/Time:  Tuesday June 26 2018 15:48:29 EDT Ventricular Rate:  66 PR Interval:    QRS Duration: 80 QT Interval:  536 QTC Calculation: 562 R Axis:   -13 Text Interpretation:  Sinus rhythm Borderline prolonged PR interval Abnormal R-wave progression, early transition Left ventricular hypertrophy Borderline T abnormalities, lateral leads Prolonged QT interval No significant change since last tracing Confirmed by Merrily Pew [phone]) on 06/26/2018 4:32:40 PM       ____________________________________________  RADIOLOGY  Dg Chest 2 View  Result Date: 06/26/2018 CLINICAL DATA:  Dizziness and confusion. EXAM: CHEST - 2 VIEW COMPARISON:  05/16/2018 FINDINGS: The heart is enlarged but also accentuated by AP technique. The lungs are free of focal consolidations and pleural effusions. No pulmonary edema. There is mild perihilar peribronchial thickening. IMPRESSION: 1. Cardiomegaly. 2. Bronchitic changes. 3.  No focal acute pulmonary abnormality. Electronically Signed   By: Nolon Nations M.D.   On: 06/26/2018 16:57   Mr Brain Wo Contrast  Result Date: 06/26/2018 CLINICAL DATA:  Focal neuro deficit for greater than 6 hours. Stroke suspected. New onset confusion beginning today. Patient is walking to the left. EXAM: MRI HEAD WITHOUT CONTRAST TECHNIQUE: Multiplanar, multiecho pulse sequences of the brain and surrounding structures were obtained without intravenous contrast. COMPARISON:  MRI brain 05/29/2018 FINDINGS: Brain: Chronic posterior left parietal and occipital lobe encephalomalacia is again seen. Advanced atrophy and diffuse white matter disease bilaterally is similar to the prior exam. Dilated perivascular spaces are present throughout the basal ganglia. Remote lacunar infarcts of the basal ganglia bilaterally are stable. Remote lacunar infarcts  involving the cerebellum are stable. No acute infarct, hemorrhage, or mass lesion is present. The ventricles are proportionate to the degree of atrophy. No significant extra-axial fluid collection is present. Vascular: Flow is present in the major intracranial arteries. Skull and upper cervical spine: The craniocervical junction is within normal limits. The upper cervical spine is unremarkable. Marrow signal  is normal. Sinuses/Orbits: The paranasal sinuses and mastoid air cells are clear. Bilateral lens replacements are present. Globes and orbits are otherwise within limits. IMPRESSION: 1. Stable Vance atrophy and diffuse white matter disease. This likely reflects the sequela of chronic microvascular ischemia. 2. Stable chronic encephalomalacia involving the posterior inferior left parietal and occipital lobe. 3. Stable remote lacunar infarcts involving the basal ganglia and cerebellum bilaterally. Electronically Signed   By: San Morelle M.D.   On: 06/26/2018 16:46    ____________________________________________   PROCEDURES  Procedure(s) performed:   Procedures   ____________________________________________   INITIAL IMPRESSION / ASSESSMENT AND PLAN / ED COURSE  Dehydration? Stroke? No e/o infection currently. MRI and fluids. Will reevaluate.   Patient consistently alert and oriented here. At baseline per her and daughter. Did have high blood pressure but was recently taken off of medications. Will continue to monitor at facility and follow up with PCP for same.     Pertinent labs & imaging results that were available during my care of the patient were reviewed by me and considered in my medical decision making (see chart for details).  ____________________________________________  FINAL CLINICAL IMPRESSION(S) / ED DIAGNOSES  Final diagnoses:  Hypertension, unspecified type  Confusion     MEDICATIONS GIVEN DURING THIS VISIT:  Medications  lactated ringers bolus  1,000 mL (0 mLs Intravenous Stopped 06/26/18 1830)  potassium chloride SA (K-DUR,KLOR-CON) CR tablet 40 mEq (40 mEq Oral Given 06/26/18 1550)     NEW OUTPATIENT MEDICATIONS STARTED DURING THIS VISIT:  Discharge Medication List as of 06/26/2018  6:11 PM      Note:  This note was prepared with assistance of Dragon voice recognition software. Occasional wrong-word or sound-a-like substitutions may have occurred due to the inherent limitations of voice recognition software.   Merrily Pew, MD 06/26/18 [id number]

## 2018-06-27 ENCOUNTER — Other Ambulatory Visit (HOSPITAL_COMMUNITY)
Admission: RE | Admit: 2018-06-27 | Discharge: 2018-06-27 | Disposition: A | Discharge status: Home / Self Care | Payer: PPO | Source: Ambulatory Visit | Attending: Nurse Practitioner | Admitting: Nurse Practitioner

## 2018-06-27 DIAGNOSIS — Z853 Personal history of malignant neoplasm of breast: Secondary | ICD-10-CM | POA: Diagnosis not present

## 2018-06-27 DIAGNOSIS — Z9049 Acquired absence of other specified parts of digestive tract: Secondary | ICD-10-CM | POA: Diagnosis not present

## 2018-06-27 DIAGNOSIS — R410 Disorientation, unspecified: Secondary | ICD-10-CM | POA: Insufficient documentation

## 2018-06-27 DIAGNOSIS — Z7984 Long term (current) use of oral hypoglycemic drugs: Secondary | ICD-10-CM | POA: Insufficient documentation

## 2018-06-27 DIAGNOSIS — R4181 Age-related cognitive decline: Secondary | ICD-10-CM | POA: Diagnosis not present

## 2018-06-27 DIAGNOSIS — E1122 Type 2 diabetes mellitus with diabetic chronic kidney disease: Secondary | ICD-10-CM | POA: Insufficient documentation

## 2018-06-27 DIAGNOSIS — N184 Chronic kidney disease, stage 4 (severe): Secondary | ICD-10-CM | POA: Diagnosis not present

## 2018-06-27 DIAGNOSIS — I129 Hypertensive chronic kidney disease with stage 1 through stage 4 chronic kidney disease, or unspecified chronic kidney disease: Secondary | ICD-10-CM | POA: Insufficient documentation

## 2018-06-27 DIAGNOSIS — Z8673 Personal history of transient ischemic attack (TIA), and cerebral infarction without residual deficits: Secondary | ICD-10-CM | POA: Diagnosis not present

## 2018-06-27 DIAGNOSIS — F329 Major depressive disorder, single episode, unspecified: Secondary | ICD-10-CM | POA: Insufficient documentation

## 2018-06-27 DIAGNOSIS — E876 Hypokalemia: Secondary | ICD-10-CM | POA: Diagnosis not present

## 2018-06-27 DIAGNOSIS — Z87891 Personal history of nicotine dependence: Secondary | ICD-10-CM | POA: Insufficient documentation

## 2018-06-27 DIAGNOSIS — D649 Anemia, unspecified: Secondary | ICD-10-CM | POA: Diagnosis not present

## 2018-06-27 DIAGNOSIS — N390 Urinary tract infection, site not specified: Secondary | ICD-10-CM | POA: Diagnosis not present

## 2018-06-27 DIAGNOSIS — F419 Anxiety disorder, unspecified: Secondary | ICD-10-CM | POA: Insufficient documentation

## 2018-06-27 LAB — RETICULOCYTES
RBC.: 3.75 MIL/uL — ABNORMAL LOW (ref 3.87–5.11)
RETIC CT PCT: 1.8 % (ref 0.4–3.1)
Retic Count, Absolute: 67.5 10*3/uL (ref 19.0–186.0)

## 2018-06-27 LAB — FERRITIN: FERRITIN: 23 ng/mL (ref 11–307)

## 2018-06-27 LAB — IRON AND TIBC
Iron: 37 ug/dL (ref 28–170)
SATURATION RATIOS: 11 % (ref 10.4–31.8)
TIBC: 349 ug/dL (ref 250–450)
UIBC: 312 ug/dL

## 2018-06-27 LAB — BRAIN NATRIURETIC PEPTIDE: B NATRIURETIC PEPTIDE 5: 462 pg/mL — AB (ref 0.0–100.0)

## 2018-06-27 LAB — VITAMIN B12: Vitamin B-12: 2735 pg/mL — ABNORMAL HIGH (ref 180–914)

## 2018-06-27 LAB — FOLATE: Folate: 10.8 ng/mL (ref >5.9)

## 2018-06-27 LAB — D-DIMER, QUANTITATIVE: D-Dimer, Quant: 2.34 ug/mL-FEU — ABNORMAL HIGH (ref 0.00–0.50)

## 2018-06-28 ENCOUNTER — Other Ambulatory Visit: Payer: Self-pay

## 2018-06-28 ENCOUNTER — Emergency Department (HOSPITAL_COMMUNITY)
Admission: EM | Admit: 2018-06-28 | Discharge: 2018-06-28 | Disposition: A | Discharge status: Skilled Nursing Facility | Payer: PPO | Attending: Emergency Medicine | Admitting: Emergency Medicine

## 2018-06-28 ENCOUNTER — Encounter (HOSPITAL_COMMUNITY): Payer: Self-pay

## 2018-06-28 DIAGNOSIS — I5030 Unspecified diastolic (congestive) heart failure: Secondary | ICD-10-CM | POA: Diagnosis not present

## 2018-06-28 DIAGNOSIS — Z79899 Other long term (current) drug therapy: Secondary | ICD-10-CM | POA: Diagnosis not present

## 2018-06-28 DIAGNOSIS — F0390 Unspecified dementia without behavioral disturbance: Secondary | ICD-10-CM | POA: Insufficient documentation

## 2018-06-28 DIAGNOSIS — Z7982 Long term (current) use of aspirin: Secondary | ICD-10-CM | POA: Insufficient documentation

## 2018-06-28 DIAGNOSIS — N184 Chronic kidney disease, stage 4 (severe): Secondary | ICD-10-CM | POA: Diagnosis not present

## 2018-06-28 DIAGNOSIS — I10 Essential (primary) hypertension: Secondary | ICD-10-CM | POA: Diagnosis not present

## 2018-06-28 DIAGNOSIS — E1122 Type 2 diabetes mellitus with diabetic chronic kidney disease: Secondary | ICD-10-CM | POA: Diagnosis not present

## 2018-06-28 DIAGNOSIS — Z7984 Long term (current) use of oral hypoglycemic drugs: Secondary | ICD-10-CM | POA: Insufficient documentation

## 2018-06-28 DIAGNOSIS — R030 Elevated blood-pressure reading, without diagnosis of hypertension: Secondary | ICD-10-CM | POA: Diagnosis present

## 2018-06-28 DIAGNOSIS — Z853 Personal history of malignant neoplasm of breast: Secondary | ICD-10-CM | POA: Diagnosis not present

## 2018-06-28 DIAGNOSIS — I130 Hypertensive heart and chronic kidney disease with heart failure and stage 1 through stage 4 chronic kidney disease, or unspecified chronic kidney disease: Secondary | ICD-10-CM | POA: Diagnosis not present

## 2018-06-28 DIAGNOSIS — Z87891 Personal history of nicotine dependence: Secondary | ICD-10-CM | POA: Diagnosis not present

## 2018-06-28 NOTE — ED Triage Notes (Signed)
Pt brought over from [place] due hypertension and "not acting her usual". Pt was seen yesterday for same problems.EMS BP 184/88 and was instructed per Dr Sabra Heck to bring to ED. Denies pain. Documented in MAR at 0700 140/78. Pt oriented to person, and place

## 2018-06-28 NOTE — Discharge Instructions (Addendum)
Patient has a condition called isolated systolic hypertension.  This means the top number is high, but the bottom number is normal.  This is not unusual in elderly people.  Her primary care doctor can decide whether blood pressure medication is indicated or not.

## 2018-06-28 NOTE — ED Provider Notes (Signed)
[facility] EMERGENCY DEPARTMENT Provider Note   CSN: [id number] Arrival date & time: 06/28/18  1002     History   Chief Complaint Chief Complaint  Patient presents with  . Hypertension    HPI Nancy Medina is a 84 y.o. female.  Level 5 caveat for dementia.  Patient presents with her daughter with a concern about her blood pressure.  Apparently her blood pressure medications have been stopped by her primary care doctor.  Daughter reports normal behavior.  MRI scan on 06/26/2018 shows no acute findings.  No chest pain, dyspnea, fever, sweats, chills     Past Medical History:  Diagnosis Date  . Achilles tendinitis   . Allergic rhinitis   . Anemia of chronic disease    at least back to 2010  . Anxiety   . Benign paroxysmal positional vertigo   . Cataract   . Cerebrovascular accident ([place])   . Chronic kidney disease (CKD), stage III (moderate) (HCC)   . Depression   . DJD (degenerative joint disease)   . DM (diabetes mellitus) ([place])    type II  . GERD (gastroesophageal reflux disease)   . HTN (hypertension)   . HX: breast cancer   . Hyperlipidemia   . Hypertonicity of bladder   . Knee pain, left   . Leg pain   . Lymphocytic colitis 2008   treated with Lialda short-term  . Obesity   . Osteoporosis, unspecified   . Renal disease    stage III  . Unspecified fall   . Unspecified glaucoma(365.9)     Patient Active Problem List   Diagnosis Date Noted  . Acute bronchitis due to Rhinovirus 03/20/2018  . Diabetic hyperosmolar non-ketotic state ([place]) 03/18/2018  . Acute respiratory failure with hypoxia ([place]) 03/18/2018  . CKD (chronic kidney disease) stage 4, GFR 15-29 ml/min (HCC) 03/18/2018  . AKI (acute kidney injury) ([place]) 02/09/2018  . Acute lower UTI 02/09/2018  . Nausea 02/09/2018  . Constipation 02/09/2018  . Diarrhea 11/25/2011  . Lymphocytic colitis 06/15/2011  . Esophageal dysphagia 06/15/2011  . UNSPECIFIED ANEMIA 04/27/2009  . DIASTOLIC DYSFUNCTION  14/48/1856  . ACHILLES TENDINITIS 10/27/2008  . PERIPHERAL EDEMA 10/14/2008  . ALLERGIC RHINITIS, SEASONAL 03/26/2008  . RENAL DISEASE, CHRONIC, STAGE III 03/26/2008  . DEGENERATIVE DISC DISEASE 01/15/2008  . Type 2 diabetes mellitus with hyperlipidemia ([place]) 12/13/2007  . HYPERLIPIDEMIA 10/11/2006  . OBESITY NOS 10/11/2006  . ANXIETY 10/11/2006  . DEPRESSION 10/11/2006  . GLAUCOMA NOS 10/11/2006  . BENIGN POSITIONAL VERTIGO 10/11/2006  . Essential hypertension 10/11/2006  . GERD 10/11/2006  . OVERACTIVE BLADDER 10/11/2006  . OSTEOPOROSIS 10/11/2006  . BREAST CANCER, HX OF 10/11/2006  . CEREBROVASCULAR ACCIDENT, HX OF 10/11/2006    Past Surgical History:  Procedure Laterality Date  . Bladder tack  2000  . BREAST LUMPECTOMY  2004   right  . cataracts    . CHOLECYSTECTOMY  1960  . COLONOSCOPY  07/2007   lymphocytic colitis  . ESOPHAGOGASTRODUODENOSCOPY  06/24/2011   Procedure: ESOPHAGOGASTRODUODENOSCOPY (EGD);  Surgeon: Dorothyann Peng, MD;  [facility];  Service: Endoscopy;  Laterality: N/A;.  Peptic stricture, mild erosive reflux esophagitis, hiatal  . FLEXIBLE SIGMOIDOSCOPY  11/25/2011   MODERATE DIVERTICULOSIS/INTERNAL HEMORRHOIDS  . S/P Hysterectomy  2000   benign reasons  . SAVORY DILATION  06/24/2011   Procedure: SAVORY DILATION;  Surgeon: Dorothyann Peng, MD;  Location: [facility];  Service: Endoscopy;  Laterality: N/A;     OB History  None      Home Medications    Prior to Admission medications   Medication Sig Start Date End Date Taking? Authorizing Provider  acetaminophen (TYLENOL) 500 MG tablet Take 500 mg by mouth every 6 (six) hours as needed for mild pain.   Yes [provider]  aspirin 81 MG tablet Take 81 mg by mouth daily.     Yes [provider]  Cholecalciferol (VITAMIN D) 2000 units CAPS Take 1 capsule by mouth daily.   Yes [provider]  Cholecalciferol (VITAMIN D-1000 MAX ST) 1000 UNITS tablet Take 5,000  Units by mouth daily.    Yes [provider]  clonazePAM (KLONOPIN) 1 MG tablet Take 1 mg by mouth at bedtime. *May take one tablet every 6 hours as needed for anxiety up to three times daily   Yes [provider]  ezetimibe (ZETIA) 10 MG tablet Take 1 tablet by mouth daily. 01/24/18  Yes [provider]  fish oil-omega-3 fatty acids 1000 MG capsule Take 1 g by mouth daily.    Yes [provider]  furosemide (LASIX) 20 MG tablet Take 1 tablet by mouth daily. 03/07/18  Yes [provider]  glimepiride (AMARYL) 1 MG tablet Take 1 tablet by mouth daily. 04/24/18  Yes [provider]  latanoprost (XALATAN) 0.005 % ophthalmic solution Place 1 drop into both eyes daily.  06/02/11  Yes [provider]  loperamide (IMODIUM A-D) 2 MG tablet Take 2 mg by mouth 4 (four) times daily as needed for diarrhea or loose stools.   Yes [provider]  meclizine (ANTIVERT) 25 MG tablet Take 25 mg by mouth 4 (four) times daily as needed for dizziness.   Yes [provider]  omeprazole (PRILOSEC) 40 MG capsule Take 1 capsule by mouth daily.  01/23/18  Yes [provider]  PROAIR HFA 108 (90 Base) MCG/ACT inhaler Take 1-2 puffs by mouth every 6 (six) hours as needed. 03/17/18  Yes [provider]  sitaGLIPtin (JANUVIA) 25 MG tablet Take 1 tablet (25 mg total) by mouth daily. 03/20/18  Yes Tat, Shanon Brow, MD  timolol (TIMOPTIC) 0.5 % ophthalmic solution Place 1 drop into both eyes every 12 (twelve) hours.  01/17/18  Yes [provider]  vitamin B-12 (CYANOCOBALAMIN) 1000 MCG tablet Take 1 tablet (1,000 mcg total) by mouth daily. 03/20/18  Yes TatShanon Brow, MD    Family History Family History  Problem Relation Age of Onset  . Colon cancer Neg Hx           . Liver disease Neg Hx           . GI problems Neg Hx             Social History Social History   Tobacco Use  . Smoking status: Former Smoker    Packs/day: 1.00     Types: Cigarettes  . Smokeless tobacco: Never Used  . Tobacco comment: 30 yrs ago  Substance Use Topics  . Alcohol use: No  . Drug use: No     Allergies   Metformin   Review of Systems Review of Systems  Unable to perform ROS: Dementia     Physical Exam Updated Vital Signs BP (!) 189/84   Pulse 64   Temp 97.9 F (36.6 C) (Oral)   Resp 18   Wt 72.6 kg   SpO2 95%   BMI 27.47 kg/m   Physical Exam  Constitutional:  No acute distress  HENT:  Head:  Normocephalic and atraumatic.  Eyes: Conjunctivae are normal.  Neck: Neck supple.  Cardiovascular: Normal rate and regular rhythm.  Pulmonary/Chest: Effort normal and breath sounds normal.  Abdominal: Soft. Bowel sounds are normal.  Musculoskeletal: Normal range of motion.  Neurological: She is alert.  Alert and oriented x2: Knows name and place.   Moving all 4 extremities  Skin: Skin is warm and dry.  Psychiatric:  Flat affect  Nursing note and vitals reviewed.    ED Treatments / Results  Labs (all labs ordered are listed, but only abnormal results are displayed) Labs Reviewed - No data to display  EKG None  Radiology Dg Chest 2 View  Result Date: 06/26/2018 CLINICAL DATA:  Dizziness and confusion. EXAM: CHEST - 2 VIEW COMPARISON:  05/16/2018 FINDINGS: The heart is enlarged but also accentuated by AP technique. The lungs are free of focal consolidations and pleural effusions. No pulmonary edema. There is mild perihilar peribronchial thickening. IMPRESSION: 1. Cardiomegaly. 2. Bronchitic changes. 3.  No focal acute pulmonary abnormality. Electronically Signed   By: Nolon Nations M.D.   On: 06/26/2018 16:57   Mr Brain Wo Contrast  Result Date: 06/26/2018 CLINICAL DATA:  Focal neuro deficit for greater than 6 hours. Stroke suspected. New onset confusion beginning today. Patient is walking to the left. EXAM: MRI HEAD WITHOUT CONTRAST TECHNIQUE: Multiplanar, multiecho pulse sequences of the brain and surrounding  structures were obtained without intravenous contrast. COMPARISON:  MRI brain 05/29/2018 FINDINGS: Brain: Chronic posterior left parietal and occipital lobe encephalomalacia is again seen. Advanced atrophy and diffuse white matter disease bilaterally is similar to the prior exam. Dilated perivascular spaces are present throughout the basal ganglia. Remote lacunar infarcts of the basal ganglia bilaterally are stable. Remote lacunar infarcts involving the cerebellum are stable. No acute infarct, hemorrhage, or mass lesion is present. The ventricles are proportionate to the degree of atrophy. No significant extra-axial fluid collection is present. Vascular: Flow is present in the major intracranial arteries. Skull and upper cervical spine: The craniocervical junction is within normal limits. The upper cervical spine is unremarkable. Marrow signal is normal. Sinuses/Orbits: The paranasal sinuses and mastoid air cells are clear. Bilateral lens replacements are present. Globes and orbits are otherwise within limits. IMPRESSION: 1. Stable Vance atrophy and diffuse white matter disease. This likely reflects the sequela of chronic microvascular ischemia. 2. Stable chronic encephalomalacia involving the posterior inferior left parietal and occipital lobe. 3. Stable remote lacunar infarcts involving the basal ganglia and cerebellum bilaterally. Electronically Signed   By: San Morelle M.D.   On: 06/26/2018 16:46    Procedures Procedures (including critical care time)  Medications Ordered in ED Medications - No data to display   Initial Impression / Assessment and Plan / ED Course  I have reviewed the triage vital signs and the nursing notes.  Pertinent labs & imaging results that were available during my care of the patient were reviewed by me and considered in my medical decision making (see chart for details).     Daughter is concerned about her elevated blood pressure.  She reports normal behavior.   Patient has isolated systolic hypertension.  She is asymptomatic.  No change in therapy at this time.  She will follow-up with her primary care doctor.  Final Clinical Impressions(s) / ED Diagnoses   Final diagnoses:  Hypertension, isolated systolic    ED Discharge Orders    None       Nat Christen, MD 06/28/18 1325

## 2018-07-04 ENCOUNTER — Other Ambulatory Visit: Payer: Self-pay

## 2018-07-04 ENCOUNTER — Inpatient Hospital Stay (HOSPITAL_COMMUNITY)
Admission: EM | Admit: 2018-07-04 | Discharge: 2018-07-06 | DRG: 683 | Disposition: A | Discharge status: Home Health | Payer: PPO | Source: Skilled Nursing Facility | Attending: Internal Medicine | Admitting: Internal Medicine

## 2018-07-04 ENCOUNTER — Emergency Department (HOSPITAL_COMMUNITY): Payer: PPO

## 2018-07-04 ENCOUNTER — Encounter (HOSPITAL_COMMUNITY): Payer: Self-pay

## 2018-07-04 DIAGNOSIS — S41112A Laceration without foreign body of left upper arm, initial encounter: Secondary | ICD-10-CM | POA: Diagnosis not present

## 2018-07-04 DIAGNOSIS — Z66 Do not resuscitate: Secondary | ICD-10-CM | POA: Diagnosis present

## 2018-07-04 DIAGNOSIS — R0902 Hypoxemia: Secondary | ICD-10-CM | POA: Diagnosis not present

## 2018-07-04 DIAGNOSIS — W010XXA Fall on same level from slipping, tripping and stumbling without subsequent striking against object, initial encounter: Secondary | ICD-10-CM | POA: Diagnosis present

## 2018-07-04 DIAGNOSIS — Z8673 Personal history of transient ischemic attack (TIA), and cerebral infarction without residual deficits: Secondary | ICD-10-CM

## 2018-07-04 DIAGNOSIS — M1990 Unspecified osteoarthritis, unspecified site: Secondary | ICD-10-CM | POA: Diagnosis present

## 2018-07-04 DIAGNOSIS — S0190XA Unspecified open wound of unspecified part of head, initial encounter: Secondary | ICD-10-CM | POA: Diagnosis not present

## 2018-07-04 DIAGNOSIS — F0390 Unspecified dementia without behavioral disturbance: Secondary | ICD-10-CM | POA: Diagnosis present

## 2018-07-04 DIAGNOSIS — N179 Acute kidney failure, unspecified: Secondary | ICD-10-CM | POA: Diagnosis present

## 2018-07-04 DIAGNOSIS — Z90710 Acquired absence of both cervix and uterus: Secondary | ICD-10-CM | POA: Diagnosis not present

## 2018-07-04 DIAGNOSIS — D638 Anemia in other chronic diseases classified elsewhere: Secondary | ICD-10-CM | POA: Diagnosis present

## 2018-07-04 DIAGNOSIS — R55 Syncope and collapse: Secondary | ICD-10-CM

## 2018-07-04 DIAGNOSIS — Z9049 Acquired absence of other specified parts of digestive tract: Secondary | ICD-10-CM

## 2018-07-04 DIAGNOSIS — W19XXXA Unspecified fall, initial encounter: Secondary | ICD-10-CM | POA: Diagnosis not present

## 2018-07-04 DIAGNOSIS — I4581 Long QT syndrome: Secondary | ICD-10-CM | POA: Diagnosis present

## 2018-07-04 DIAGNOSIS — N3000 Acute cystitis without hematuria: Secondary | ICD-10-CM | POA: Diagnosis not present

## 2018-07-04 DIAGNOSIS — S0101XA Laceration without foreign body of scalp, initial encounter: Secondary | ICD-10-CM | POA: Diagnosis present

## 2018-07-04 DIAGNOSIS — N318 Other neuromuscular dysfunction of bladder: Secondary | ICD-10-CM | POA: Diagnosis present

## 2018-07-04 DIAGNOSIS — Z7984 Long term (current) use of oral hypoglycemic drugs: Secondary | ICD-10-CM | POA: Diagnosis not present

## 2018-07-04 DIAGNOSIS — F329 Major depressive disorder, single episode, unspecified: Secondary | ICD-10-CM | POA: Diagnosis present

## 2018-07-04 DIAGNOSIS — M79603 Pain in arm, unspecified: Secondary | ICD-10-CM | POA: Diagnosis not present

## 2018-07-04 DIAGNOSIS — S0990XA Unspecified injury of head, initial encounter: Secondary | ICD-10-CM | POA: Diagnosis not present

## 2018-07-04 DIAGNOSIS — S51812A Laceration without foreign body of left forearm, initial encounter: Secondary | ICD-10-CM | POA: Diagnosis not present

## 2018-07-04 DIAGNOSIS — E860 Dehydration: Secondary | ICD-10-CM | POA: Diagnosis present

## 2018-07-04 DIAGNOSIS — H409 Unspecified glaucoma: Secondary | ICD-10-CM | POA: Diagnosis present

## 2018-07-04 DIAGNOSIS — E785 Hyperlipidemia, unspecified: Secondary | ICD-10-CM | POA: Diagnosis present

## 2018-07-04 DIAGNOSIS — Z87891 Personal history of nicotine dependence: Secondary | ICD-10-CM

## 2018-07-04 DIAGNOSIS — S8992XA Unspecified injury of left lower leg, initial encounter: Secondary | ICD-10-CM | POA: Diagnosis present

## 2018-07-04 DIAGNOSIS — N170 Acute kidney failure with tubular necrosis: Secondary | ICD-10-CM | POA: Diagnosis not present

## 2018-07-04 DIAGNOSIS — I130 Hypertensive heart and chronic kidney disease with heart failure and stage 1 through stage 4 chronic kidney disease, or unspecified chronic kidney disease: Secondary | ICD-10-CM | POA: Diagnosis present

## 2018-07-04 DIAGNOSIS — F419 Anxiety disorder, unspecified: Secondary | ICD-10-CM | POA: Diagnosis present

## 2018-07-04 DIAGNOSIS — R9431 Abnormal electrocardiogram [ECG] [EKG]: Secondary | ICD-10-CM | POA: Diagnosis not present

## 2018-07-04 DIAGNOSIS — I10 Essential (primary) hypertension: Secondary | ICD-10-CM | POA: Diagnosis present

## 2018-07-04 DIAGNOSIS — N184 Chronic kidney disease, stage 4 (severe): Secondary | ICD-10-CM | POA: Diagnosis present

## 2018-07-04 DIAGNOSIS — Z7982 Long term (current) use of aspirin: Secondary | ICD-10-CM | POA: Diagnosis not present

## 2018-07-04 DIAGNOSIS — S8002XA Contusion of left knee, initial encounter: Secondary | ICD-10-CM | POA: Diagnosis not present

## 2018-07-04 DIAGNOSIS — N183 Chronic kidney disease, stage 3 unspecified: Secondary | ICD-10-CM | POA: Diagnosis present

## 2018-07-04 DIAGNOSIS — Y9301 Activity, walking, marching and hiking: Secondary | ICD-10-CM | POA: Diagnosis present

## 2018-07-04 DIAGNOSIS — I5032 Chronic diastolic (congestive) heart failure: Secondary | ICD-10-CM | POA: Diagnosis present

## 2018-07-04 DIAGNOSIS — M810 Age-related osteoporosis without current pathological fracture: Secondary | ICD-10-CM | POA: Diagnosis present

## 2018-07-04 DIAGNOSIS — I129 Hypertensive chronic kidney disease with stage 1 through stage 4 chronic kidney disease, or unspecified chronic kidney disease: Secondary | ICD-10-CM | POA: Diagnosis not present

## 2018-07-04 DIAGNOSIS — Z6827 Body mass index (BMI) 27.0-27.9, adult: Secondary | ICD-10-CM

## 2018-07-04 DIAGNOSIS — E1165 Type 2 diabetes mellitus with hyperglycemia: Secondary | ICD-10-CM | POA: Diagnosis present

## 2018-07-04 DIAGNOSIS — E669 Obesity, unspecified: Secondary | ICD-10-CM | POA: Diagnosis present

## 2018-07-04 DIAGNOSIS — Y998 Other external cause status: Secondary | ICD-10-CM | POA: Diagnosis not present

## 2018-07-04 DIAGNOSIS — E1122 Type 2 diabetes mellitus with diabetic chronic kidney disease: Secondary | ICD-10-CM | POA: Diagnosis present

## 2018-07-04 DIAGNOSIS — Y92129 Unspecified place in nursing home as the place of occurrence of the external cause: Secondary | ICD-10-CM | POA: Diagnosis not present

## 2018-07-04 DIAGNOSIS — Z9849 Cataract extraction status, unspecified eye: Secondary | ICD-10-CM

## 2018-07-04 DIAGNOSIS — S0101XD Laceration without foreign body of scalp, subsequent encounter: Secondary | ICD-10-CM | POA: Diagnosis not present

## 2018-07-04 DIAGNOSIS — K219 Gastro-esophageal reflux disease without esophagitis: Secondary | ICD-10-CM | POA: Diagnosis present

## 2018-07-04 DIAGNOSIS — Z888 Allergy status to other drugs, medicaments and biological substances status: Secondary | ICD-10-CM | POA: Diagnosis not present

## 2018-07-04 DIAGNOSIS — M25569 Pain in unspecified knee: Secondary | ICD-10-CM | POA: Diagnosis not present

## 2018-07-04 DIAGNOSIS — Z79899 Other long term (current) drug therapy: Secondary | ICD-10-CM | POA: Diagnosis not present

## 2018-07-04 DIAGNOSIS — F32A Depression, unspecified: Secondary | ICD-10-CM | POA: Diagnosis present

## 2018-07-04 DIAGNOSIS — Z853 Personal history of malignant neoplasm of breast: Secondary | ICD-10-CM | POA: Diagnosis not present

## 2018-07-04 DIAGNOSIS — M25562 Pain in left knee: Secondary | ICD-10-CM | POA: Diagnosis not present

## 2018-07-04 LAB — BASIC METABOLIC PANEL
Anion gap: 9 (ref 5–15)
BUN: 31 mg/dL — ABNORMAL HIGH (ref 8–23)
CO2: 26 mmol/L (ref 22–32)
CREATININE: 2.29 mg/dL — AB (ref 0.44–1.00)
Calcium: 8.7 mg/dL — ABNORMAL LOW (ref 8.9–10.3)
Chloride: 106 mmol/L (ref 98–111)
GFR calc non Af Amer: 18 mL/min — ABNORMAL LOW (ref >60)
GFR, EST AFRICAN AMERICAN: 20 mL/min — AB (ref >60)
GLUCOSE: 122 mg/dL — AB (ref 70–99)
Potassium: 3.4 mmol/L — ABNORMAL LOW (ref 3.5–5.1)
Sodium: 141 mmol/L (ref 135–145)

## 2018-07-04 LAB — CBC
HEMATOCRIT: 30.5 % — AB (ref 36.0–46.0)
Hemoglobin: 9.6 g/dL — ABNORMAL LOW (ref 12.0–15.0)
MCH: 27.8 pg (ref 26.0–34.0)
MCHC: 31.5 g/dL (ref 30.0–36.0)
MCV: 88.4 fL (ref 78.0–100.0)
Platelets: 189 10*3/uL (ref 150–400)
RBC: 3.45 MIL/uL — ABNORMAL LOW (ref 3.87–5.11)
RDW: 14.7 % (ref 11.5–15.5)
WBC: 6.4 10*3/uL (ref 4.0–10.5)

## 2018-07-04 LAB — GLUCOSE, CAPILLARY: GLUCOSE-CAPILLARY: 122 mg/dL — AB (ref 70–99)

## 2018-07-04 LAB — MRSA PCR SCREENING: MRSA by PCR: NEGATIVE

## 2018-07-04 MED ORDER — SODIUM CHLORIDE 0.9 % IV SOLN
INTRAVENOUS | Status: DC
Start: 2018-07-04 — End: 2018-07-06
  Administered 2018-07-04 – 2018-07-05 (×3): via INTRAVENOUS

## 2018-07-04 MED ORDER — HYDRALAZINE HCL 20 MG/ML IJ SOLN
10.0000 mg | Freq: Once | INTRAMUSCULAR | Status: AC
  Administered 2018-07-05: 10 mg via INTRAVENOUS
  Filled 2018-07-04: qty 1

## 2018-07-04 MED ORDER — SODIUM CHLORIDE 0.9 % IV SOLN
1000.0000 mL | INTRAVENOUS | Status: DC
  Administered 2018-07-04: 1000 mL via INTRAVENOUS

## 2018-07-04 MED ORDER — SODIUM CHLORIDE 0.9 % IV BOLUS (SEPSIS)
500.0000 mL | Freq: Once | INTRAVENOUS | Status: AC
  Administered 2018-07-04: 500 mL via INTRAVENOUS

## 2018-07-04 MED ORDER — SODIUM CHLORIDE 0.9% FLUSH: 3.0000 mL | Freq: Two times a day (BID) | INTRAVENOUS | Status: DC

## 2018-07-04 NOTE — H&P (Signed)
History and Physical    Nancy Medina QIW:[id number] DOB: September 15, 1925 DOA: 07/04/2018  PCP: Lemmie Evens, MD  Patient coming from: Nursing home  Chief Complaint: Fall  HPI: Nancy Medina is a 84 y.o. female with medical history significant of chronic kidney disease, stroke, benign positional vertigo, obesity, comes in after suffering from a fall while walking with a walker with her daughter today at the nursing home.  Daughter reports she just lost her balance and fell daughter tried to grab her but she fell hit her head has a laceration to her head.  She has not been sick no nausea vomiting diarrhea.  She has not had any complaints of anything.  Patient is being referred for admission for possible presyncope with acute kidney injury.  All history obtained from chart and from daughter as patient cannot provide accurate history  Review of Systems: As per HPI otherwise 10 point review of systems negative her daughter  Past Medical History:  Diagnosis Date  . Achilles tendinitis   . Allergic rhinitis   . Anemia of chronic disease    at least back to 2010  . Anxiety   . Benign paroxysmal positional vertigo   . Cataract   . Cerebrovascular accident ([place])   . Chronic kidney disease (CKD), stage III (moderate) (HCC)   . Depression   . DJD (degenerative joint disease)   . DM (diabetes mellitus) ([place])    type II  . GERD (gastroesophageal reflux disease)   . HTN (hypertension)   . HX: breast cancer   . Hyperlipidemia   . Hypertonicity of bladder   . Knee pain, left   . Leg pain   . Lymphocytic colitis 2008   treated with Lialda short-term  . Obesity   . Osteoporosis, unspecified   . Renal disease    stage III  . Unspecified fall   . Unspecified glaucoma(365.9)     Past Surgical History:  Procedure Laterality Date  . Bladder tack  2000  . BREAST LUMPECTOMY  2004   right  . cataracts    . CHOLECYSTECTOMY  1960  . COLONOSCOPY  07/2007   lymphocytic colitis  .  ESOPHAGOGASTRODUODENOSCOPY  06/24/2011   Procedure: ESOPHAGOGASTRODUODENOSCOPY (EGD);  Surgeon: Nancy Peng, MD;  Location: [facility];  Service: Endoscopy;  Laterality: N/A;.  Peptic stricture, mild erosive reflux esophagitis, hiatal  . FLEXIBLE SIGMOIDOSCOPY  11/25/2011   MODERATE DIVERTICULOSIS/INTERNAL HEMORRHOIDS  . S/P Hysterectomy  2000   benign reasons  . SAVORY DILATION  06/24/2011   Procedure: SAVORY DILATION;  Surgeon: Nancy Peng, MD;  Location: [facility];  Service: Endoscopy;  Laterality: N/A;     reports that she has quit smoking. Her smoking use included cigarettes. She smoked 1.00 pack per day. She has never used smokeless tobacco. She reports that she does not drink alcohol or use drugs.  Allergies  Allergen Reactions  . Metformin     REACTION: Felt poor    Family History  Problem Relation Age of Onset  . Colon cancer Neg Hx           . Liver disease Neg Hx           . GI problems Neg Hx             Prior to Admission medications   Medication Sig Start Date End Date Taking? Authorizing Provider  acetaminophen (TYLENOL) 500 MG tablet Take 500 mg by mouth every 6 (six) hours as needed for  mild pain.   Yes [provider]  aspirin 81 MG tablet Take 81 mg by mouth daily.     Yes [provider]  Cholecalciferol (VITAMIN D) 2000 units CAPS Take 1 capsule by mouth daily.   Yes [provider]  Cholecalciferol (VITAMIN D-1000 MAX ST) 1000 UNITS tablet Take 5,000 Units by mouth daily.    Yes [provider]  clonazePAM (KLONOPIN) 1 MG tablet Take 1 mg by mouth at bedtime. *May take one tablet every 6 hours as needed for anxiety up to three times daily   Yes [provider]  docusate sodium (COLACE) 100 MG capsule Take 100 mg by mouth 2 (two) times daily as needed for mild constipation.   Yes [provider]  ezetimibe (ZETIA) 10 MG tablet Take 1 tablet by mouth daily. 01/24/18  Yes [provider]  fish  oil-omega-3 fatty acids 1000 MG capsule Take 1 g by mouth daily.    Yes [provider]  furosemide (LASIX) 20 MG tablet Take 1 tablet by mouth daily. 03/07/18  Yes [provider]  glimepiride (AMARYL) 1 MG tablet Take 1 tablet by mouth daily. 04/24/18  Yes [provider]  latanoprost (XALATAN) 0.005 % ophthalmic solution Place 1 drop into both eyes daily.  06/02/11  Yes [provider]  loperamide (IMODIUM A-D) 2 MG tablet Take 2 mg by mouth 4 (four) times daily as needed for diarrhea or loose stools.   Yes [provider]  meclizine (ANTIVERT) 25 MG tablet Take 25 mg by mouth 4 (four) times daily as needed for dizziness.   Yes [provider]  omeprazole (PRILOSEC) 40 MG capsule Take 1 capsule by mouth daily.  01/23/18  Yes [provider]  PROAIR HFA 108 (90 Base) MCG/ACT inhaler Take 1-2 puffs by mouth every 6 (six) hours as needed. 03/17/18  Yes [provider]  sitaGLIPtin (JANUVIA) 25 MG tablet Take 1 tablet (25 mg total) by mouth daily. 03/20/18  Yes Medina, Nancy Brow, MD  sulfamethoxazole-trimethoprim (BACTRIM DS,SEPTRA DS) 800-160 MG tablet Take 1 tablet by mouth 2 (two) times daily. 10 day course starting on 07/02/18   Yes [provider]  timolol (TIMOPTIC) 0.5 % ophthalmic solution Place 1 drop into both eyes every 12 (twelve) hours.  01/17/18  Yes [provider]  vitamin B-12 (CYANOCOBALAMIN) 1000 MCG tablet Take 1 tablet (1,000 mcg total) by mouth daily. 03/20/18  Nancy Furlong, MD    Physical Exam: Vitals:   07/04/18 1539 07/04/18 1600 07/04/18 1845  BP: (!) 196/82 (!) 193/68   Pulse: 71 70 65  Resp: 16  18  Temp: 98.7 F (37.1 C)    TempSrc: Oral    SpO2: 95% 93% 96%      Constitutional: NAD, calm, comfortable Vitals:   07/04/18 1539 07/04/18 1600 07/04/18 1845  BP: (!) 196/82 (!) 193/68   Pulse: 71 70 65  Resp: 16  18  Temp: 98.7 F (37.1 C)    TempSrc: Oral    SpO2: 95% 93% 96%    Eyes: PERRL, lids and conjunctivae normal ENMT: Mucous membranes are moist. Posterior pharynx clear of any exudate or lesions.Normal dentition.  Neck: normal, supple, no masses, no thyromegaly Respiratory: clear to auscultation bilaterally, no wheezing, no crackles. Normal respiratory effort. No accessory muscle use.  Cardiovascular: Regular rate and rhythm, no murmurs / rubs / gallops. No extremity edema. 2+ pedal pulses. No carotid bruits.  Abdomen: no tenderness, no masses palpated. No hepatosplenomegaly.  Bowel sounds positive.  Musculoskeletal: no clubbing / cyanosis. No joint deformity upper and lower extremities. Good ROM, no contractures. Normal muscle tone.  Skin: no rashes, lesions, ulcers. No induration Neurologic: CN 2-12 grossly intact. Sensation intact, DTR normal. Strength 5/5 in all 4.  Psychiatric: Impaired judgment and insight. Alert and oriented x 1. Normal mood.    Labs on Admission: I have personally reviewed following labs and imaging studies  CBC: Recent Labs  Lab 07/04/18 1624  WBC 6.4  HGB 9.6*  HCT 30.5*  MCV 88.4  PLT 169   Basic Metabolic Panel: Recent Labs  Lab 07/04/18 1624  NA 141  K 3.4*  CL 106  CO2 26  GLUCOSE 122*  BUN 31*  CREATININE 2.29*  CALCIUM 8.7*   GFR: Estimated Creatinine Clearance: 16 mL/min (A) (by C-G formula based on SCr of 2.29 mg/dL (H)). Liver Function Tests: No results for input(s): AST, ALT, ALKPHOS, BILITOT, PROT, ALBUMIN in the last 168 hours. No results for input(s): LIPASE, AMYLASE in the last 168 hours. No results for input(s): AMMONIA in the last 168 hours. Coagulation Profile: No results for input(s): INR, PROTIME in the last 168 hours. Cardiac Enzymes: No results for input(s): CKTOTAL, CKMB, CKMBINDEX, TROPONINI in the last 168 hours. BNP (last 3 results) No results for input(s): PROBNP in the last 8760 hours. HbA1C: No results for input(s): HGBA1C in the last 72 hours. CBG: No results for input(s):  GLUCAP in the last 168 hours. Lipid Profile: No results for input(s): CHOL, HDL, LDLCALC, TRIG, CHOLHDL, LDLDIRECT in the last 72 hours. Thyroid Function Tests: No results for input(s): TSH, T4TOTAL, FREET4, T3FREE, THYROIDAB in the last 72 hours. Anemia Panel: No results for input(s): VITAMINB12, FOLATE, FERRITIN, TIBC, IRON, RETICCTPCT in the last 72 hours. Urine analysis:    Component Value Date/Time   COLORURINE YELLOW 06/26/2018 1329   APPEARANCEUR HAZY (A) 06/26/2018 1329   LABSPEC 1.016 06/26/2018 1329   PHURINE 5.0 06/26/2018 1329   GLUCOSEU NEGATIVE 06/26/2018 1329   HGBUR SMALL (A) 06/26/2018 1329   HGBUR negative 06/04/2008 0811   BILIRUBINUR NEGATIVE 06/26/2018 1329   KETONESUR NEGATIVE 06/26/2018 1329   PROTEINUR 100 (A) 06/26/2018 1329   UROBILINOGEN 0.2 06/04/2008 0811   NITRITE NEGATIVE 06/26/2018 1329   LEUKOCYTESUR NEGATIVE 06/26/2018 1329   Sepsis Labs: !!!!!!!!!!!!!!!!!!!!!!!!!!!!!!!!!!!!!!!!!!!! @LABRCNTIP (procalcitonin:4,lacticidven:4) )No results found for this or any previous visit (from the past 240 hour(s)).   Radiological Exams on Admission: Ct Head Wo Contrast  Result Date: 07/04/2018 CLINICAL DATA:  Fall. Hematoma and laceration to back of head. Breast cancer and hypertension. EXAM: CT HEAD WITHOUT CONTRAST TECHNIQUE: Contiguous axial images were obtained from the base of the skull through the vertex without intravenous contrast. COMPARISON:  06/26/2018 MRI.  05/09/2018 CT. FINDINGS: Brain: Advanced cerebral atrophy. Persistent marked low density in the periventricular white matter likely related to small vessel disease. Encephalomalacia involving the left parietal and occipital regions, similar. There is also remote infarct or infarcts involving the left cerebellar hemisphere. No hemorrhage, hydrocephalus, intra-axial, or extra-axial fluid collection. Vascular: Intracranial atherosclerosis. Skull: Left parietal scalp soft tissue swelling and a staple.  Hyperostosis frontalis interna. No skull fractures. Sinuses/Orbits: Surgical changes about the right globe. Clear paranasal sinuses and mastoid air cells. Other: None. IMPRESSION: 1. Left-sided scalp soft tissue swelling, without acute intracranial abnormality. 2.  Cerebral atrophy and small vessel ischemic change. 3. Remote infarcts, including within the left parietal/occipital lobe. Electronically Signed   By: Abigail Miyamoto M.D.   On: 07/04/2018  17:31    EKG: Independently reviewed.  Normal sinus rhythm no acute changes Old chart reviewed Case discussed with Dr. Tomi Bamberger in the ED  Assessment/Plan 85 year old female with mechanical fall and acute kidney injury Principal Problem:   AKI (acute kidney injury) (HCC)-Baseline creatinine is around 1.6 currently 2.2.  Gentle IV fluids overnight.  Observe overnight on telemetry.  Active Problems:   Fall-sounds mechanical.  Check orthostatics.  Obtain physical therapy evaluation.   Scalp laceration-has staple to scalp   Essential hypertension-resume home meds   DIASTOLIC DYSFUNCTION-noted   CKD (chronic kidney disease) stage 4, GFR 15-29 ml/min (HCC)-as above   DVT prophylaxis: SCDs Code Status: DNR Family Communication: Daughter Disposition Plan: Tomorrow Consults called: None Admission status: Observation   DAVID,RACHAL A MD [facility]  If 7PM-7AM, please contact night-coverage [email or web address] Password [email or web address]  07/04/2018, 7:08 PM

## 2018-07-04 NOTE — ED Triage Notes (Addendum)
Pt brought in from [place] due to fall. Pt lost balance while using her walker and fell backwards. Noted to have hematoma and laceration to back of head. Denies Loss of consciousness. Pt has cervical collar in place. Alert and verbally responsive

## 2018-07-04 NOTE — ED Provider Notes (Signed)
[facility] EMERGENCY DEPARTMENT Provider Note   CSN: [id number] Arrival date & time: 07/04/18  1532     History   Chief Complaint Chief Complaint  Patient presents with  . Fall  . Laceration    HPI Nancy Medina is a 84 y.o. female.  HPI Patient was brought into the emergency room for evaluation after fall.  Patient was walking with her walker when she lost her balance and fell backwards according to the EMS report.  Patient herself not recall the details of what happened.  When asked her what brought her into the emergency room she told me it was a long story.  When I mentioned to her that I was told that she had a fall when walking today and hit her head she told me that sounds about right.  Patient denies any trouble with headache.  She denies any neck pain.  No numbness or weakness.  No chest pain or shortness of breath.  No vomiting or diarrhea. Past Medical History:  Diagnosis Date  . Achilles tendinitis   . Allergic rhinitis   . Anemia of chronic disease    at least back to 2010  . Anxiety   . Benign paroxysmal positional vertigo   . Cataract   . Cerebrovascular accident ([place])   . Chronic kidney disease (CKD), stage III (moderate) (HCC)   . Depression   . DJD (degenerative joint disease)   . DM (diabetes mellitus) ([place])    type II  . GERD (gastroesophageal reflux disease)   . HTN (hypertension)   . HX: breast cancer   . Hyperlipidemia   . Hypertonicity of bladder   . Knee pain, left   . Leg pain   . Lymphocytic colitis 2008   treated with Lialda short-term  . Obesity   . Osteoporosis, unspecified   . Renal disease    stage III  . Unspecified fall   . Unspecified glaucoma(365.9)     Patient Active Problem List   Diagnosis Date Noted  . Acute bronchitis due to Rhinovirus 03/20/2018  . Diabetic hyperosmolar non-ketotic state ([place]) 03/18/2018  . Acute respiratory failure with hypoxia ([place]) 03/18/2018  . CKD (chronic kidney disease) stage 4, GFR 15-29  ml/min (HCC) 03/18/2018  . AKI (acute kidney injury) ([place]) 02/09/2018  . Acute lower UTI 02/09/2018  . Nausea 02/09/2018  . Constipation 02/09/2018  . Diarrhea 11/25/2011  . Lymphocytic colitis 06/15/2011  . Esophageal dysphagia 06/15/2011  . UNSPECIFIED ANEMIA 04/27/2009  . DIASTOLIC DYSFUNCTION 46/65/9935  . ACHILLES TENDINITIS 10/27/2008  . PERIPHERAL EDEMA 10/14/2008  . ALLERGIC RHINITIS, SEASONAL 03/26/2008  . RENAL DISEASE, CHRONIC, STAGE III 03/26/2008  . DEGENERATIVE DISC DISEASE 01/15/2008  . Type 2 diabetes mellitus with hyperlipidemia ([place]) 12/13/2007  . HYPERLIPIDEMIA 10/11/2006  . OBESITY NOS 10/11/2006  . ANXIETY 10/11/2006  . DEPRESSION 10/11/2006  . GLAUCOMA NOS 10/11/2006  . BENIGN POSITIONAL VERTIGO 10/11/2006  . Essential hypertension 10/11/2006  . GERD 10/11/2006  . OVERACTIVE BLADDER 10/11/2006  . OSTEOPOROSIS 10/11/2006  . BREAST CANCER, HX OF 10/11/2006  . CEREBROVASCULAR ACCIDENT, HX OF 10/11/2006    Past Surgical History:  Procedure Laterality Date  . Bladder tack  2000  . BREAST LUMPECTOMY  2004   right  . cataracts    . CHOLECYSTECTOMY  1960  . COLONOSCOPY  07/2007   lymphocytic colitis  . ESOPHAGOGASTRODUODENOSCOPY  06/24/2011   Procedure: ESOPHAGOGASTRODUODENOSCOPY (EGD);  Surgeon: Dorothyann Peng, MD;  Location: [facility] ENDO SU[facility];  Service: Endoscopy;  Laterality: N/A;.  Peptic stricture, mild erosive reflux esophagitis, hiatal  . FLEXIBLE SIGMOIDOSCOPY  11/25/2011   MODERATE DIVERTICULOSIS/INTERNAL HEMORRHOIDS  . S/P Hysterectomy  2000   benign reasons  . SAVORY DILATION  06/24/2011   Procedure: SAVORY DILATION;  Surgeon: Dorothyann Peng, MD;  Location: [facility] ENDO SUITE;  Service: Endoscopy;  Laterality: N/A;     OB History   None      Home Medications    Prior to Admission medications   Medication Sig Start Date End Date Taking? Authorizing Provider  acetaminophen (TYLENOL) 500 MG tablet Take 500 mg by mouth every 6 (six) hours as needed for  mild pain.   Yes [provider]  aspirin 81 MG tablet Take 81 mg by mouth daily.     Yes [provider]  Cholecalciferol (VITAMIN D) 2000 units CAPS Take 1 capsule by mouth daily.   Yes [provider]  Cholecalciferol (VITAMIN D-1000 MAX ST) 1000 UNITS tablet Take 5,000 Units by mouth daily.    Yes [provider]  clonazePAM (KLONOPIN) 1 MG tablet Take 1 mg by mouth at bedtime. *May take one tablet every 6 hours as needed for anxiety up to three times daily   Yes [provider]  docusate sodium (COLACE) 100 MG capsule Take 100 mg by mouth 2 (two) times daily as needed for mild constipation.   Yes [provider]  ezetimibe (ZETIA) 10 MG tablet Take 1 tablet by mouth daily. 01/24/18  Yes [provider]  fish oil-omega-3 fatty acids 1000 MG capsule Take 1 g by mouth daily.    Yes [provider]  furosemide (LASIX) 20 MG tablet Take 1 tablet by mouth daily. 03/07/18  Yes [provider]  glimepiride (AMARYL) 1 MG tablet Take 1 tablet by mouth daily. 04/24/18  Yes [provider]  latanoprost (XALATAN) 0.005 % ophthalmic solution Place 1 drop into both eyes daily.  06/02/11  Yes [provider]  loperamide (IMODIUM A-D) 2 MG tablet Take 2 mg by mouth 4 (four) times daily as needed for diarrhea or loose stools.   Yes [provider]  meclizine (ANTIVERT) 25 MG tablet Take 25 mg by mouth 4 (four) times daily as needed for dizziness.   Yes [provider]  omeprazole (PRILOSEC) 40 MG capsule Take 1 capsule by mouth daily.  01/23/18  Yes [provider]  PROAIR HFA 108 (90 Base) MCG/ACT inhaler Take 1-2 puffs by mouth every 6 (six) hours as needed. 03/17/18  Yes [provider]  sitaGLIPtin (JANUVIA) 25 MG tablet Take 1 tablet (25 mg total) by mouth daily. 03/20/18  Yes Tat, Shanon Brow, MD  sulfamethoxazole-trimethoprim (BACTRIM DS,SEPTRA DS) 800-160 MG tablet Take 1 tablet by mouth  2 (two) times daily. 10 day course starting on 07/02/18   Yes [provider]  timolol (TIMOPTIC) 0.5 % ophthalmic solution Place 1 drop into both eyes every 12 (twelve) hours.  01/17/18  Yes [provider]  vitamin B-12 (CYANOCOBALAMIN) 1000 MCG tablet Take 1 tablet (1,000 mcg total) by mouth daily. 03/20/18  Yes TatShanon Brow, MD    Family History Family History  Problem Relation Age of Onset  . Colon cancer Neg Hx           . Liver disease Neg Hx           . GI problems Neg Hx             Social History Social History  Tobacco Use  . Smoking status: Former Smoker    Packs/day: 1.00    Types: Cigarettes  . Smokeless tobacco: Never Used  . Tobacco comment: 30 yrs ago  Substance Use Topics  . Alcohol use: No  . Drug use: No     Allergies   Metformin   Review of Systems Review of Systems  All other systems reviewed and are negative.    Physical Exam Updated Vital Signs BP (!) 193/68   Pulse 65 Comment: BP 169/97  Temp 98.7 F (37.1 C) (Oral)   Resp 18 Comment: BP 169/97  SpO2 96% Comment: BP 169/97  Physical Exam  Constitutional: She appears well-developed and well-nourished. No distress.  HENT:  Head: Normocephalic.  Right Ear: External ear normal.  Left Ear: External ear normal.  Hematoma with some blood matted in her hair on the left occipital region  Eyes: Conjunctivae are normal. Right eye exhibits no discharge. Left eye exhibits no discharge. No scleral icterus.  Neck: Neck supple. No tracheal deviation present.  No spinal tenderness to palpation  Cardiovascular: Normal rate, regular rhythm and intact distal pulses.  Pulmonary/Chest: Effort normal and breath sounds normal. No stridor. No respiratory distress. She has no wheezes. She has no rales.  Abdominal: Soft. Bowel sounds are normal. She exhibits no distension. There is no tenderness. There is no rebound and no guarding.  Musculoskeletal: She exhibits no edema or tenderness.    Patient is able to move all extremities without pain or discomfort  Neurological: She is alert. She has normal strength. No cranial nerve deficit (no facial droop, extraocular movements intact, no slurred speech) or sensory deficit. She exhibits normal muscle tone. She displays no seizure activity. Coordination normal.  Equal strength in her upper and lower extremities  Skin: Skin is warm and dry. No rash noted.  Psychiatric: She has a normal mood and affect.  Nursing note and vitals reviewed.    ED Treatments / Results  Labs (all labs ordered are listed, but only abnormal results are displayed) Labs Reviewed  CBC - Abnormal; Notable for the following components:      Result Value   RBC 3.45 (*)    Hemoglobin 9.6 (*)    HCT 30.5 (*)    All other components within normal limits  BASIC METABOLIC PANEL - Abnormal; Notable for the following components:   Potassium 3.4 (*)    Glucose, Bld 122 (*)    BUN 31 (*)    Creatinine, Ser 2.29 (*)    Calcium 8.7 (*)    GFR calc non Af Amer 18 (*)    GFR calc Af Amer 20 (*)    All other components within normal limits    EKG EKG Interpretation  Date/Time:  Wednesday July 04 2018 16:13:29 EDT Ventricular Rate:  69 PR Interval:    QRS Duration: 84 QT Interval:  539 QTC Calculation: 578 R Axis:   5 Text Interpretation:  Sinus rhythm Atrial premature complex Short PR interval Abnormal R-wave progression, early transition Borderline T abnormalities, lateral leads Prolonged QT interval Baseline wander in lead(s) II III aVF No significant change since last tracing Confirmed by Dorie Rank [phone]) on 07/04/2018 6:55:12 PM   Radiology Ct Head Wo Contrast  Result Date: 07/04/2018 CLINICAL DATA:  Fall. Hematoma and laceration to back of head. Breast cancer and hypertension. EXAM: CT HEAD WITHOUT CONTRAST TECHNIQUE: Contiguous axial images were obtained from the base of the skull through the vertex without intravenous contrast. COMPARISON:   06/26/2018  MRI.  05/09/2018 CT. FINDINGS: Brain: Advanced cerebral atrophy. Persistent marked low density in the periventricular white matter likely related to small vessel disease. Encephalomalacia involving the left parietal and occipital regions, similar. There is also remote infarct or infarcts involving the left cerebellar hemisphere. No hemorrhage, hydrocephalus, intra-axial, or extra-axial fluid collection. Vascular: Intracranial atherosclerosis. Skull: Left parietal scalp soft tissue swelling and a staple. Hyperostosis frontalis interna. No skull fractures. Sinuses/Orbits: Surgical changes about the right globe. Clear paranasal sinuses and mastoid air cells. Other: None. IMPRESSION: 1. Left-sided scalp soft tissue swelling, without acute intracranial abnormality. 2.  Cerebral atrophy and small vessel ischemic change. 3. Remote infarcts, including within the left parietal/occipital lobe. Electronically Signed   By: Abigail Miyamoto M.D.   On: 07/04/2018 17:31    Procedures .Marland KitchenLaceration Repair Date/Time: 07/04/2018 6:55 PM Performed by: Dorie Rank, MD Authorized by: Dorie Rank, MD   Consent:    Consent obtained:  Verbal   Consent given by:  Patient   Risks discussed:  Infection, need for additional repair, pain, poor cosmetic result and poor wound healing   Alternatives discussed:  No treatment and delayed treatment Universal protocol:    Procedure explained and questions answered to patient or proxy's satisfaction: yes     Relevant documents present and verified: yes     Test results available and properly labeled: yes     Imaging studies available: yes     Required blood products, implants, devices, and special equipment available: yes     Site/side marked: yes     Immediately prior to procedure, a time out was called: yes     Patient identity confirmed:  Verbally with patient Anesthesia (see MAR for exact dosages):    Anesthesia method:  Local infiltration Laceration details:     Location:  Scalp   Scalp location:  Occipital   Length (cm):  1 Repair type:    Repair type:  Simple Pre-procedure details:    Preparation:  Patient was prepped and draped in usual sterile fashion Exploration:    Contaminated: no   Treatment:    Amount of cleaning:  Standard   Irrigation solution:  Sterile saline   Irrigation method:  Syringe Skin repair:    Repair method:  Staples   Number of staples:  1 Approximation:    Approximation:  Close Post-procedure details:    Dressing:  Open (no dressing)   Patient tolerance of procedure:  Tolerated well, no immediate complications   (including critical care time)  Medications Ordered in ED Medications  sodium chloride 0.9 % bolus 500 mL (0 mLs Intravenous Stopped 07/04/18 1853)    Followed by  0.9 %  sodium chloride infusion (1,000 mLs Intravenous New Bag/Given 07/04/18 1822)     Initial Impression / Assessment and Plan / ED Course  I have reviewed the triage vital signs and the nursing notes.  Pertinent labs & imaging results that were available during my care of the patient were reviewed by me and considered in my medical decision making (see chart for details).  Clinical Course as of Jul 04 1856  Wed Jul 04, 2018  1803 Anemia is stable  CBC(!) [JK]  1804 Cr increased from prior   [JK]  1806 Labs notable for AKI.  Pt is on a diuretic.  Will give IV fluids   [JK]  1857 cT scan negative for acute injury   [JK]    Clinical Course User Index [JK] Dorie Rank, MD    Patient presented to  the emergency room for evaluation after near syncopal episode.  Patient fell back and struck the back of her head sustaining a laceration.  Patient's laboratory tests are notable for an elevated creatinine compared to just 8 days ago.  She has not had any vomiting or diarrhea.  No clear etiology for this acute kidney injury.  Considering this and her near syncopal episode I think is reasonable to bring her in for cardiac monitoring.  Final  Clinical Impressions(s) / ED Diagnoses   Final diagnoses:  AKI (acute kidney injury) ([place])  Near syncope  Laceration of scalp, initial encounter    ED Discharge Orders    None       Dorie Rank, MD 07/04/18 1857

## 2018-07-04 NOTE — ED Notes (Signed)
Area to back of scalp cleaned with NS. One stapled placed by Dr Tomi Bamberger

## 2018-07-05 DIAGNOSIS — Z9849 Cataract extraction status, unspecified eye: Secondary | ICD-10-CM | POA: Diagnosis not present

## 2018-07-05 DIAGNOSIS — F0390 Unspecified dementia without behavioral disturbance: Secondary | ICD-10-CM | POA: Diagnosis present

## 2018-07-05 DIAGNOSIS — N184 Chronic kidney disease, stage 4 (severe): Secondary | ICD-10-CM | POA: Diagnosis present

## 2018-07-05 DIAGNOSIS — I130 Hypertensive heart and chronic kidney disease with heart failure and stage 1 through stage 4 chronic kidney disease, or unspecified chronic kidney disease: Secondary | ICD-10-CM | POA: Diagnosis present

## 2018-07-05 DIAGNOSIS — F329 Major depressive disorder, single episode, unspecified: Secondary | ICD-10-CM | POA: Diagnosis present

## 2018-07-05 DIAGNOSIS — N170 Acute kidney failure with tubular necrosis: Secondary | ICD-10-CM | POA: Diagnosis not present

## 2018-07-05 DIAGNOSIS — Z66 Do not resuscitate: Secondary | ICD-10-CM | POA: Diagnosis present

## 2018-07-05 DIAGNOSIS — N183 Chronic kidney disease, stage 3 (moderate): Secondary | ICD-10-CM

## 2018-07-05 DIAGNOSIS — Y998 Other external cause status: Secondary | ICD-10-CM | POA: Diagnosis not present

## 2018-07-05 DIAGNOSIS — W010XXA Fall on same level from slipping, tripping and stumbling without subsequent striking against object, initial encounter: Secondary | ICD-10-CM | POA: Diagnosis present

## 2018-07-05 DIAGNOSIS — I10 Essential (primary) hypertension: Secondary | ICD-10-CM

## 2018-07-05 DIAGNOSIS — R9431 Abnormal electrocardiogram [ECG] [EKG]: Secondary | ICD-10-CM

## 2018-07-05 DIAGNOSIS — Z8673 Personal history of transient ischemic attack (TIA), and cerebral infarction without residual deficits: Secondary | ICD-10-CM | POA: Diagnosis not present

## 2018-07-05 DIAGNOSIS — Z87891 Personal history of nicotine dependence: Secondary | ICD-10-CM | POA: Diagnosis not present

## 2018-07-05 DIAGNOSIS — S0101XD Laceration without foreign body of scalp, subsequent encounter: Secondary | ICD-10-CM

## 2018-07-05 DIAGNOSIS — I5032 Chronic diastolic (congestive) heart failure: Secondary | ICD-10-CM | POA: Diagnosis present

## 2018-07-05 DIAGNOSIS — S41112A Laceration without foreign body of left upper arm, initial encounter: Secondary | ICD-10-CM | POA: Diagnosis not present

## 2018-07-05 DIAGNOSIS — K219 Gastro-esophageal reflux disease without esophagitis: Secondary | ICD-10-CM | POA: Diagnosis present

## 2018-07-05 DIAGNOSIS — E860 Dehydration: Secondary | ICD-10-CM | POA: Diagnosis present

## 2018-07-05 DIAGNOSIS — D638 Anemia in other chronic diseases classified elsewhere: Secondary | ICD-10-CM | POA: Diagnosis present

## 2018-07-05 DIAGNOSIS — Z853 Personal history of malignant neoplasm of breast: Secondary | ICD-10-CM | POA: Diagnosis not present

## 2018-07-05 DIAGNOSIS — Y9301 Activity, walking, marching and hiking: Secondary | ICD-10-CM | POA: Diagnosis present

## 2018-07-05 DIAGNOSIS — Z888 Allergy status to other drugs, medicaments and biological substances status: Secondary | ICD-10-CM | POA: Diagnosis not present

## 2018-07-05 DIAGNOSIS — S8002XA Contusion of left knee, initial encounter: Secondary | ICD-10-CM | POA: Diagnosis not present

## 2018-07-05 DIAGNOSIS — Z90710 Acquired absence of both cervix and uterus: Secondary | ICD-10-CM | POA: Diagnosis not present

## 2018-07-05 DIAGNOSIS — S0101XA Laceration without foreign body of scalp, initial encounter: Secondary | ICD-10-CM | POA: Diagnosis present

## 2018-07-05 DIAGNOSIS — I4581 Long QT syndrome: Secondary | ICD-10-CM | POA: Diagnosis present

## 2018-07-05 DIAGNOSIS — I129 Hypertensive chronic kidney disease with stage 1 through stage 4 chronic kidney disease, or unspecified chronic kidney disease: Secondary | ICD-10-CM | POA: Diagnosis not present

## 2018-07-05 DIAGNOSIS — Y92129 Unspecified place in nursing home as the place of occurrence of the external cause: Secondary | ICD-10-CM | POA: Diagnosis not present

## 2018-07-05 DIAGNOSIS — Z79899 Other long term (current) drug therapy: Secondary | ICD-10-CM | POA: Diagnosis not present

## 2018-07-05 DIAGNOSIS — M810 Age-related osteoporosis without current pathological fracture: Secondary | ICD-10-CM | POA: Diagnosis present

## 2018-07-05 DIAGNOSIS — Z7984 Long term (current) use of oral hypoglycemic drugs: Secondary | ICD-10-CM | POA: Diagnosis not present

## 2018-07-05 DIAGNOSIS — Z7982 Long term (current) use of aspirin: Secondary | ICD-10-CM | POA: Diagnosis not present

## 2018-07-05 DIAGNOSIS — M1990 Unspecified osteoarthritis, unspecified site: Secondary | ICD-10-CM | POA: Diagnosis present

## 2018-07-05 DIAGNOSIS — R55 Syncope and collapse: Secondary | ICD-10-CM | POA: Diagnosis not present

## 2018-07-05 DIAGNOSIS — N179 Acute kidney failure, unspecified: Secondary | ICD-10-CM | POA: Diagnosis present

## 2018-07-05 DIAGNOSIS — E1122 Type 2 diabetes mellitus with diabetic chronic kidney disease: Secondary | ICD-10-CM | POA: Diagnosis present

## 2018-07-05 DIAGNOSIS — S8992XA Unspecified injury of left lower leg, initial encounter: Secondary | ICD-10-CM | POA: Diagnosis present

## 2018-07-05 DIAGNOSIS — Z9049 Acquired absence of other specified parts of digestive tract: Secondary | ICD-10-CM | POA: Diagnosis not present

## 2018-07-05 LAB — CBC
HEMATOCRIT: 31.4 % — AB (ref 36.0–46.0)
Hemoglobin: 9.9 g/dL — ABNORMAL LOW (ref 12.0–15.0)
MCH: 27.9 pg (ref 26.0–34.0)
MCHC: 31.5 g/dL (ref 30.0–36.0)
MCV: 88.5 fL (ref 78.0–100.0)
Platelets: 198 10*3/uL (ref 150–400)
RBC: 3.55 MIL/uL — ABNORMAL LOW (ref 3.87–5.11)
RDW: 14.5 % (ref 11.5–15.5)
WBC: 6.3 10*3/uL (ref 4.0–10.5)

## 2018-07-05 LAB — BASIC METABOLIC PANEL
Anion gap: 8 (ref 5–15)
BUN: 25 mg/dL — AB (ref 8–23)
CHLORIDE: 108 mmol/L (ref 98–111)
CO2: 23 mmol/L (ref 22–32)
Calcium: 8.3 mg/dL — ABNORMAL LOW (ref 8.9–10.3)
Creatinine, Ser: 1.71 mg/dL — ABNORMAL HIGH (ref 0.44–1.00)
GFR calc Af Amer: 29 mL/min — ABNORMAL LOW (ref >60)
GFR calc non Af Amer: 25 mL/min — ABNORMAL LOW (ref >60)
GLUCOSE: 177 mg/dL — AB (ref 70–99)
POTASSIUM: 3.5 mmol/L (ref 3.5–5.1)
Sodium: 139 mmol/L (ref 135–145)

## 2018-07-05 LAB — MAGNESIUM: Magnesium: 1.8 mg/dL (ref 1.7–2.4)

## 2018-07-05 LAB — GLUCOSE, CAPILLARY: Glucose-Capillary: 103 mg/dL — ABNORMAL HIGH (ref 70–99)

## 2018-07-05 MED ORDER — ASPIRIN 81 MG PO CHEW
81.0000 mg | CHEWABLE_TABLET | Freq: Every day | ORAL | Status: DC
Start: 2018-07-05 — End: 2018-07-06
  Administered 2018-07-05 – 2018-07-06 (×2): 81 mg via ORAL
  Filled 2018-07-05 (×2): qty 1

## 2018-07-05 MED ORDER — POTASSIUM CHLORIDE CRYS ER 20 MEQ PO TBCR
40.0000 meq | EXTENDED_RELEASE_TABLET | Freq: Once | ORAL | Status: AC
  Administered 2018-07-05: 40 meq via ORAL
  Filled 2018-07-05: qty 2

## 2018-07-05 MED ORDER — HYDRALAZINE HCL 20 MG/ML IJ SOLN: 5.0000 mg | Freq: Once | INTRAMUSCULAR | Status: DC

## 2018-07-05 MED ORDER — ACETAMINOPHEN 325 MG PO TABS
650.0000 mg | ORAL_TABLET | Freq: Four times a day (QID) | ORAL | Status: DC | PRN
  Administered 2018-07-05: 650 mg via ORAL
  Filled 2018-07-05: qty 2

## 2018-07-05 MED ORDER — AMLODIPINE BESYLATE 5 MG PO TABS
5.0000 mg | ORAL_TABLET | Freq: Every day | ORAL | Status: DC
  Administered 2018-07-05 – 2018-07-06 (×2): 5 mg via ORAL
  Filled 2018-07-05 (×2): qty 1

## 2018-07-05 MED ORDER — HYDRALAZINE HCL 20 MG/ML IJ SOLN
10.0000 mg | Freq: Four times a day (QID) | INTRAMUSCULAR | Status: DC | PRN
  Administered 2018-07-05: 10 mg via INTRAVENOUS
  Filled 2018-07-05: qty 1

## 2018-07-05 MED ORDER — EZETIMIBE 10 MG PO TABS
10.0000 mg | ORAL_TABLET | Freq: Every day | ORAL | Status: DC
  Administered 2018-07-05 – 2018-07-06 (×2): 10 mg via ORAL
  Filled 2018-07-05 (×2): qty 1

## 2018-07-05 MED ORDER — VITAMIN B-12 1000 MCG PO TABS
1000.0000 ug | ORAL_TABLET | Freq: Every day | ORAL | Status: DC
  Administered 2018-07-05 – 2018-07-06 (×2): 1000 ug via ORAL
  Filled 2018-07-05 (×2): qty 1

## 2018-07-05 MED ORDER — HYDRALAZINE HCL 20 MG/ML IJ SOLN
10.0000 mg | Freq: Once | INTRAMUSCULAR | Status: AC
  Administered 2018-07-05: 10 mg via INTRAVENOUS
  Filled 2018-07-05: qty 1

## 2018-07-05 NOTE — Progress Notes (Signed)
Patient refused blood work this morning. Explain importance, patient declined.

## 2018-07-05 NOTE — Progress Notes (Signed)
Patient pulled out IV states "I don't want it". Patient noted to have more confusion during nighttime hours. Patient drinking well at this time. Notified on call

## 2018-07-05 NOTE — Progress Notes (Signed)
PROGRESS NOTE                                                                                                                                                                                                             Patient Demographics:    Nancy Medina, is a 84 y.o. female, DOB - [birth year], ESP:[id number]  Admit date - 07/04/2018   Admitting Physician Phillips Grout, MD  Outpatient Primary MD for the patient is Lemmie Evens, MD  LOS - 0  Outpatient Specialists: NONE   Chief Complaint  Patient presents with  . Fall  . Laceration       Brief Narrative  84 year old female with history of chronic kidney disease stage II-III (baseline creatinine around 1.5), prior history of stroke, positional vertigo, presented from assisted living with fall.  Daughter reports that she was walking with a walker when she lost her balance and fell on the floor hitting her head and sustained a laceration.  Patient denies any weakness, dizziness or headache but reports feeling weak.  Per daughter she has not had a good appetite in the past few weeks and has been depressed and somewhat confused after death in the family few months ago.  The ED he had elevated blood pressure.  Head CT was negative for acute findings.  Blood work showed acute kidney injury.  Admitted for further management.   Subjective:   Patient reports feeling somewhat better today.  Denies any headache or dizziness.   Assessment  & Plan :    Principal Problem:   AKI (acute kidney injury) on chronic kidney disease stage III ([place]) Likely due to dehydration.  Denies use of NSAIDs.  Patient also on Lasix and recently started on Bactrim for unclear reason at the facility.  Baseline creatinine around 1.5.  Getting IV hydration with slow improvement this a.m. lab.  Hold Lasix, discontinue Bactrim.  Avoid other nephrotoxic agents.   Active Problems: Fall with scalp  laceration Suspect mechanical with generalized weakness and dehydration. Continue IV fluids.  Seen by PT who recommends home health PT.  Will return to ALF possibly tomorrow once hydrated adequately.  Uncontrolled hypertension On Lasix which is held.  Placed on low-dose amlodipine and PRN hydralazine.  Chronic diastolic CHF Euvolemic.  Continue Zetia and aspirin.  Mild dementia with underlying depression As per daughter at  bedside patient has been noted to be more confused in the past few months.  Also patient's son-in-law committed suicide 4 months back after which patient has been increasingly withdrawn and appears depressed.  I discussed on starting her on low-dose antidepressant but total would like to discuss with her family first.  Prolonged QT interval (578) Replenished low potassium.  Check magnesium level.  Diabetes mellitus type 2, uncontrolled Last A1c of 7.8.  Holding home medications and monitor on sliding scale coverage.     Code Status :  DNR  Family Communication  : Daughter at bedside  Disposition Plan  : Return to ALF possibly tomorrow if hydrated well  Barriers For Discharge : Active symptoms  Consults  : None  Procedures  : CT head  DVT Prophylaxis  : Heparin  Lab Results  Component Value Date   PLT 198 07/05/2018    Antibiotics  :   Anti-infectives (From admission, onward)   None        Objective:   Vitals:   07/05/18 0522 07/05/18 1012 07/05/18 1021 07/05/18 1024  BP: (!) 188/72 (!) 178/70 (!) 214/76 (!) 194/83  Pulse: 77 72 75 83  Resp: 18     Temp: 98.2 F (36.8 C)     TempSrc: Oral     SpO2: 98%     Weight:      Height:        Wt Readings from Last 3 Encounters:  07/05/18 73.8 kg  06/28/18 72.6 kg  06/26/18 72.6 kg     Intake/Output Summary (Last 24 hours) at 07/05/2018 1502 Last data filed at 07/05/2018 1100 Gross per 24 hour  Intake 1038.33 ml  Output 1002 ml  Net 36.33 ml     Physical Exam  Gen: not in distress  flat affect HEENT: no pallor, moist mucosa, supple neck, posterior scalp laceration Chest: clear b/l, no added sounds CVS: N S1&S2, no murmurs, rubs or gallop GI: soft, NT, ND, BS+ Musculoskeletal: warm, no edema     Data Review:    CBC Recent Labs  Lab 07/04/18 1624 07/05/18 1207  WBC 6.4 6.3  HGB 9.6* 9.9*  HCT 30.5* 31.4*  PLT 189 198  MCV 88.4 88.5  MCH 27.8 27.9  MCHC 31.5 31.5  RDW 14.7 14.5    Chemistries  Recent Labs  Lab 07/04/18 1624 07/05/18 1207  NA 141 139  K 3.4* 3.5  CL 106 108  CO2 26 23  GLUCOSE 122* 177*  BUN 31* 25*  CREATININE 2.29* 1.71*  CALCIUM 8.7* 8.3*   ------------------------------------------------------------------------------------------------------------------ No results for input(s): CHOL, HDL, LDLCALC, TRIG, CHOLHDL, LDLDIRECT in the last 72 hours.  Lab Results  Component Value Date   HGBA1C 7.6 (H) 03/18/2018   ------------------------------------------------------------------------------------------------------------------ No results for input(s): TSH, T4TOTAL, T3FREE, THYROIDAB in the last 72 hours.  Invalid input(s): FREET3 ------------------------------------------------------------------------------------------------------------------ No results for input(s): VITAMINB12, FOLATE, FERRITIN, TIBC, IRON, RETICCTPCT in the last 72 hours.  Coagulation profile No results for input(s): INR, PROTIME in the last 168 hours.  No results for input(s): DDIMER in the last 72 hours.  Cardiac Enzymes No results for input(s): CKMB, TROPONINI, MYOGLOBIN in the last 168 hours.  Invalid input(s): CK ------------------------------------------------------------------------------------------------------------------    Component Value Date/Time   BNP 462.0 (H) 06/27/2018 1543    Inpatient Medications  Scheduled Meds: . amLODipine  5 mg Oral Daily  . sodium chloride flush  3 mL Intravenous Q12H   Continuous Infusions: . sodium  chloride 100 mL/hr at 07/05/18  2683   PRN Meds:.hydrALAZINE  Micro Results Recent Results (from the past 240 hour(s))  MRSA PCR Screening     Status: None   Collection Time: 07/04/18 10:00 PM  Result Value Ref Range Status   MRSA by PCR NEGATIVE NEGATIVE Final    Comment:        The GeneXpert MRSA Assay (FDA approved for NASAL specimens only), is one component of a comprehensive MRSA colonization surveillance program. It is not intended to diagnose MRSA infection nor to guide or monitor treatment for MRSA infections. Performed at [facility], [address], [place], [place] [address]     Radiology Reports Dg Chest 2 View  Result Date: 06/26/2018 CLINICAL DATA:  Dizziness and confusion. EXAM: CHEST - 2 VIEW COMPARISON:  05/16/2018 FINDINGS: The heart is enlarged but also accentuated by AP technique. The lungs are free of focal consolidations and pleural effusions. No pulmonary edema. There is mild perihilar peribronchial thickening. IMPRESSION: 1. Cardiomegaly. 2. Bronchitic changes. 3.  No focal acute pulmonary abnormality. Electronically Signed   By: Nolon Nations M.D.   On: 06/26/2018 16:57   Ct Head Wo Contrast  Result Date: 07/04/2018 CLINICAL DATA:  Fall. Hematoma and laceration to back of head. Breast cancer and hypertension. EXAM: CT HEAD WITHOUT CONTRAST TECHNIQUE: Contiguous axial images were obtained from the base of the skull through the vertex without intravenous contrast. COMPARISON:  06/26/2018 MRI.  05/09/2018 CT. FINDINGS: Brain: Advanced cerebral atrophy. Persistent marked low density in the periventricular white matter likely related to small vessel disease. Encephalomalacia involving the left parietal and occipital regions, similar. There is also remote infarct or infarcts involving the left cerebellar hemisphere. No hemorrhage, hydrocephalus, intra-axial, or extra-axial fluid collection. Vascular: Intracranial atherosclerosis. Skull: Left parietal scalp soft  tissue swelling and a staple. Hyperostosis frontalis interna. No skull fractures. Sinuses/Orbits: Surgical changes about the right globe. Clear paranasal sinuses and mastoid air cells. Other: None. IMPRESSION: 1. Left-sided scalp soft tissue swelling, without acute intracranial abnormality. 2.  Cerebral atrophy and small vessel ischemic change. 3. Remote infarcts, including within the left parietal/occipital lobe. Electronically Signed   By: Abigail Miyamoto M.D.   On: 07/04/2018 17:31   Mr Brain Wo Contrast  Result Date: 06/26/2018 CLINICAL DATA:  Focal neuro deficit for greater than 6 hours. Stroke suspected. New onset confusion beginning today. Patient is walking to the left. EXAM: MRI HEAD WITHOUT CONTRAST TECHNIQUE: Multiplanar, multiecho pulse sequences of the brain and surrounding structures were obtained without intravenous contrast. COMPARISON:  MRI brain 05/29/2018 FINDINGS: Brain: Chronic posterior left parietal and occipital lobe encephalomalacia is again seen. Advanced atrophy and diffuse white matter disease bilaterally is similar to the prior exam. Dilated perivascular spaces are present throughout the basal ganglia. Remote lacunar infarcts of the basal ganglia bilaterally are stable. Remote lacunar infarcts involving the cerebellum are stable. No acute infarct, hemorrhage, or mass lesion is present. The ventricles are proportionate to the degree of atrophy. No significant extra-axial fluid collection is present. Vascular: Flow is present in the major intracranial arteries. Skull and upper cervical spine: The craniocervical junction is within normal limits. The upper cervical spine is unremarkable. Marrow signal is normal. Sinuses/Orbits: The paranasal sinuses and mastoid air cells are clear. Bilateral lens replacements are present. Globes and orbits are otherwise within limits. IMPRESSION: 1. Stable Vance atrophy and diffuse white matter disease. This likely reflects the sequela of chronic  microvascular ischemia. 2. Stable chronic encephalomalacia involving the posterior inferior left parietal and occipital lobe. 3. Stable  remote lacunar infarcts involving the basal ganglia and cerebellum bilaterally. Electronically Signed   By: San Morelle M.D.   On: 06/26/2018 16:46    Time Spent in minutes  25   Janelie Goltz M.D on 07/05/2018 at 3:02 PM  Between 7am to 7pm - Pager - [phone]  After 7pm go to [email or web address] - password [email or web address] [place]  [facility] -  Office  [phone]

## 2018-07-05 NOTE — Plan of Care (Signed)
  Problem: Acute Rehab PT Goals(only PT should resolve) Goal: Pt Will Go Supine/Side To Sit Outcome: Progressing Flowsheets (Taken 07/05/2018 1226) Pt will go Supine/Side to Sit: with supervision Goal: Patient Will Transfer Sit To/From Stand Outcome: Progressing Flowsheets (Taken 07/05/2018 1226) Patient will transfer sit to/from stand: with supervision Goal: Pt Will Transfer Bed To Chair/Chair To Bed Outcome: Progressing Flowsheets (Taken 07/05/2018 1226) Pt will Transfer Bed to Chair/Chair to Bed: with supervision Goal: Pt Will Ambulate Outcome: Progressing Flowsheets (Taken 07/05/2018 1226) Pt will Ambulate: 50 feet; with min guard assist; with rolling walker   12:27 PM, 07/05/18 Lonell Grandchild, MPT Physical Therapist with [facility] [phone] office [phone] mobile phone

## 2018-07-05 NOTE — Care Management Note (Addendum)
Case Management Note  Patient Details  Name: Nancy Medina MRN: [id number] Date of Birth: 27-Aug-1925  Subjective/Objective:   From [place]. AKI.    Daughter at bedside.               Action/Plan: Recommended for home health PT. Patient and daughter agreeable. Has had [facility] in the past. Would like them again. Will discuss adding [place] RN also, patient has had 3 admission in six month. Will notify Theophilus Bones, who will obtain orders when available via Epic. Addendum: 07/06/2018: will add OT to [facility] orders. Patient already receiving at [facility].  Expected Discharge Date:  07/05/18               Expected Discharge Plan:  Assisted Living / Rest Home  In-House Referral:  Clinical Social Work  Discharge planning Services  CM Consult  Post Acute Care Choice:  Home Health Choice offered to:  Patient, Adult Children  DME Arranged:    DME Agency:     HH Arranged:  PT, RN [place] Agency:  [place]  Status of Service:  Completed, signed off  If discussed at [place] of Stay Meetings, dates discussed:    Additional Comments:  Coni Homesley, [name], RN 07/05/2018, 1:25 PM

## 2018-07-05 NOTE — Care Management Obs Status (Signed)
[place] NOTIFICATION   Patient Details  Name: Nancy Medina MRN: [id number] Date of Birth: [birth year]   Medicare Observation Status Notification Given:  Yes    [name], [name], RN 07/05/2018, 10:42 AM

## 2018-07-05 NOTE — Progress Notes (Signed)
Patient refused blood pressure to be rechecked. Explained to patient that blood pressure is high and need to be rechecked after medication receive for effectiveness. Patient refused and became agitated. Will attempt in morning.

## 2018-07-05 NOTE — Progress Notes (Signed)
Spoke with Sharyn Lull, nursing staff at [facility] and gave an update on the patient. Informed her that if patient remains well hydrated she could possibly return back to the facility tomorrow.

## 2018-07-05 NOTE — Evaluation (Signed)
Physical Therapy Evaluation Patient Details Name: Nancy Medina MRN: [id number] DOB: 15-Mar-1925 Today's Date: 07/05/2018   History of Present Illness  Nancy Medina is a 84 y.o. female with medical history significant of chronic kidney disease, stroke, benign positional vertigo, obesity, comes in after suffering from a fall while walking with a walker with her daughter today at the nursing home.  Daughter reports she just lost her balance and fell daughter tried to grab her but she fell hit her head has a laceration to her head.  She has not been sick no nausea vomiting diarrhea.  She has not had any complaints of anything.  Patient is being referred for admission for possible presyncope with acute kidney injury.  All history obtained from chart and from daughter as patient cannot provide accurate history    Clinical Impression  Patient requires extra time to follow directions/anwser questions, cooperative with therapy, demonstrates slightly labored movement for sitting up, transfers and walking with RW, and mostly limited secondary to c/o fatigue and headache.  Patient noted to have BP > 200/100 when initially sitting up at bedside - RN aware and to medicate appropriately.  Patient tolerated sitting up at bedside to eat breakfast with her daughter present in room after therapy - RN notified.  Patient will benefit from continued physical therapy in hospital and recommended venue below to increase strength, balance, endurance for safe ADLs and gait.    Follow Up Recommendations Home health PT;Supervision for mobility/OOB    Equipment Recommendations  None recommended by PT    Recommendations for Other Services       Precautions / Restrictions Precautions Precautions: Fall Restrictions Weight Bearing Restrictions: No      Mobility  Bed Mobility Overal bed mobility: Needs Assistance Bed Mobility: Supine to Sit     Supine to sit: Min guard     General bed mobility comments:  slightly labored movement   Transfers Overall transfer level: Needs assistance Equipment used: Rolling walker (2 wheeled) Transfers: Sit to/from [facility] Sit to Stand: Min guard Stand pivot transfers: Min guard       General transfer comment: slow labored movement  Ambulation/Gait Ambulation/Gait assistance: Min assist Gait Distance (Feet): 25 Feet Assistive device: Rolling walker (2 wheeled) Gait Pattern/deviations: Decreased step length - right;Decreased step length - left;Decreased stride length Gait velocity: slow   General Gait Details: slow labored cadence with frequent stopping due to distraction when talking, no loss of balance, limited secondary to c/o fatigue  Stairs            Wheelchair Mobility    Modified Rankin (Stroke Patients Only)       Balance Overall balance assessment: Needs assistance Sitting-balance support: Feet supported;No upper extremity supported Sitting balance-Leahy Scale: Good     Standing balance support: Bilateral upper extremity supported;During functional activity Standing balance-Leahy Scale: Fair                               Pertinent Vitals/Pain Pain Assessment: Faces Faces Pain Scale: Hurts whole lot Pain Location: headache Pain Descriptors / Indicators: Aching Pain Intervention(s): Limited activity within patient's tolerance;Monitored during session;Patient requesting pain meds-RN notified    Home Living Family/patient expects to be discharged to:: Assisted living               Home Equipment: Walker - 4 wheels;Bedside commode;Shower seat      Prior Function Level of Independence: Needs assistance  Gait / Transfers Assistance Needed: Ambulates with Rollator, sometimes with walking stick household distances  ADL's / Homemaking Assistance Needed: assisted by ALF staff        Hand Dominance        Extremity/Trunk Assessment   Upper Extremity Assessment Upper  Extremity Assessment: Generalized weakness    Lower Extremity Assessment Lower Extremity Assessment: Generalized weakness    Cervical / Trunk Assessment Cervical / Trunk Assessment: Normal  Communication   Communication: No difficulties  Cognition Arousal/Alertness: Awake/alert Behavior During Therapy: WFL for tasks assessed/performed Overall Cognitive Status: Within Functional Limits for tasks assessed                                        General Comments      Exercises     Assessment/Plan    PT Assessment Patient needs continued PT services  PT Problem List Decreased strength;Decreased activity tolerance;Decreased balance;Decreased mobility       PT Treatment Interventions Gait training;Stair training;Functional mobility training;Therapeutic activities;Therapeutic exercise;Patient/family education    PT Goals (Current goals can be found in the Care Plan section)  Acute Rehab PT Goals Patient Stated Goal: return to ALF PT Goal Formulation: With patient/family Time For Goal Achievement: 07/12/18 Potential to Achieve Goals: Good    Frequency Min 3X/week   Barriers to discharge        Co-evaluation               AM-PAC PT "6 Clicks" Daily Activity  Outcome Measure Difficulty turning over in bed (including adjusting bedclothes, sheets and blankets)?: None Difficulty moving from lying on back to sitting on the side of the bed? : A Little Difficulty sitting down on and standing up from a chair with arms (e.g., wheelchair, bedside commode, etc,.)?: A Little Help needed moving to and from a bed to chair (including a wheelchair)?: A Little Help needed walking in hospital room?: A Little Help needed climbing 3-5 steps with a railing? : A Lot 6 Click Score: 18    End of Session   Activity Tolerance: Patient tolerated treatment well;Patient limited by fatigue Patient left: in bed;with call bell/phone within reach;with family/visitor  present(seated at bedside to eat breakfast) Nurse Communication: Mobility status;Other (comment)(RN notified that patient left sitting up at bedside with family member present in room) PT Visit Diagnosis: Unsteadiness on feet (R26.81);Other abnormalities of gait and mobility (R26.89);Muscle weakness (generalized) (M62.81)    Time: 7793-9030 PT Time Calculation (min) (ACUTE ONLY): 32 min   Charges:   PT Evaluation $PT Eval Moderate Complexity: 1 Mod PT Treatments $Therapeutic Activity: 23-37 mins        12:25 PM, 07/05/18 Lonell Grandchild, MPT Physical Therapist with [facility] [phone] office [phone] mobile phone

## 2018-07-06 ENCOUNTER — Encounter (HOSPITAL_COMMUNITY): Payer: Self-pay

## 2018-07-06 ENCOUNTER — Emergency Department (HOSPITAL_COMMUNITY)
Admission: EM | Admit: 2018-07-06 | Discharge: 2018-07-06 | Disposition: A | Discharge status: Home / Self Care | Payer: PPO | Attending: Emergency Medicine | Admitting: Emergency Medicine

## 2018-07-06 ENCOUNTER — Emergency Department (HOSPITAL_COMMUNITY): Payer: PPO

## 2018-07-06 DIAGNOSIS — R55 Syncope and collapse: Secondary | ICD-10-CM

## 2018-07-06 DIAGNOSIS — S8002XA Contusion of left knee, initial encounter: Secondary | ICD-10-CM | POA: Diagnosis not present

## 2018-07-06 DIAGNOSIS — Y9301 Activity, walking, marching and hiking: Secondary | ICD-10-CM | POA: Insufficient documentation

## 2018-07-06 DIAGNOSIS — Y998 Other external cause status: Secondary | ICD-10-CM | POA: Insufficient documentation

## 2018-07-06 DIAGNOSIS — N183 Chronic kidney disease, stage 3 unspecified: Secondary | ICD-10-CM | POA: Diagnosis present

## 2018-07-06 DIAGNOSIS — S41112A Laceration without foreign body of left upper arm, initial encounter: Secondary | ICD-10-CM

## 2018-07-06 DIAGNOSIS — E1122 Type 2 diabetes mellitus with diabetic chronic kidney disease: Secondary | ICD-10-CM | POA: Insufficient documentation

## 2018-07-06 DIAGNOSIS — I129 Hypertensive chronic kidney disease with stage 1 through stage 4 chronic kidney disease, or unspecified chronic kidney disease: Secondary | ICD-10-CM | POA: Diagnosis not present

## 2018-07-06 DIAGNOSIS — Z87891 Personal history of nicotine dependence: Secondary | ICD-10-CM | POA: Insufficient documentation

## 2018-07-06 DIAGNOSIS — I10 Essential (primary) hypertension: Secondary | ICD-10-CM | POA: Diagnosis present

## 2018-07-06 DIAGNOSIS — S51812A Laceration without foreign body of left forearm, initial encounter: Secondary | ICD-10-CM | POA: Diagnosis not present

## 2018-07-06 DIAGNOSIS — S8992XA Unspecified injury of left lower leg, initial encounter: Secondary | ICD-10-CM | POA: Diagnosis not present

## 2018-07-06 DIAGNOSIS — W010XXA Fall on same level from slipping, tripping and stumbling without subsequent striking against object, initial encounter: Secondary | ICD-10-CM | POA: Insufficient documentation

## 2018-07-06 DIAGNOSIS — Y92129 Unspecified place in nursing home as the place of occurrence of the external cause: Secondary | ICD-10-CM | POA: Insufficient documentation

## 2018-07-06 DIAGNOSIS — Z7982 Long term (current) use of aspirin: Secondary | ICD-10-CM | POA: Diagnosis not present

## 2018-07-06 DIAGNOSIS — Z7984 Long term (current) use of oral hypoglycemic drugs: Secondary | ICD-10-CM | POA: Insufficient documentation

## 2018-07-06 DIAGNOSIS — Z853 Personal history of malignant neoplasm of breast: Secondary | ICD-10-CM | POA: Diagnosis not present

## 2018-07-06 DIAGNOSIS — N179 Acute kidney failure, unspecified: Secondary | ICD-10-CM | POA: Diagnosis present

## 2018-07-06 DIAGNOSIS — S0101XA Laceration without foreign body of scalp, initial encounter: Secondary | ICD-10-CM

## 2018-07-06 DIAGNOSIS — W19XXXA Unspecified fall, initial encounter: Secondary | ICD-10-CM

## 2018-07-06 LAB — BASIC METABOLIC PANEL
ANION GAP: 9 (ref 5–15)
BUN: 19 mg/dL (ref 8–23)
CALCIUM: 8.6 mg/dL — AB (ref 8.9–10.3)
CO2: 22 mmol/L (ref 22–32)
Chloride: 107 mmol/L (ref 98–111)
Creatinine, Ser: 1.43 mg/dL — ABNORMAL HIGH (ref 0.44–1.00)
GFR calc Af Amer: 36 mL/min — ABNORMAL LOW (ref >60)
GFR, EST NON AFRICAN AMERICAN: 31 mL/min — AB (ref >60)
GLUCOSE: 164 mg/dL — AB (ref 70–99)
POTASSIUM: 3.7 mmol/L (ref 3.5–5.1)
Sodium: 138 mmol/L (ref 135–145)

## 2018-07-06 LAB — GLUCOSE, CAPILLARY
GLUCOSE-CAPILLARY: 171 mg/dL — AB (ref 70–99)
Glucose-Capillary: 163 mg/dL — ABNORMAL HIGH (ref 70–99)

## 2018-07-06 MED ORDER — AMLODIPINE BESYLATE 5 MG PO TABS
10.0000 mg | ORAL_TABLET | Freq: Every day | ORAL | Status: DC
Start: 2018-07-07 — End: 2018-07-06

## 2018-07-06 MED ORDER — BACITRACIN ZINC 500 UNIT/GM EX OINT
TOPICAL_OINTMENT | CUTANEOUS | Status: AC
  Administered 2018-07-06: 1
  Filled 2018-07-06: qty 1.8

## 2018-07-06 MED ORDER — AMLODIPINE BESYLATE 10 MG PO TABS: 10.0000 mg | ORAL_TABLET | Freq: Every day | ORAL | 0 refills | Status: DC

## 2018-07-06 NOTE — ED Notes (Signed)
Wound to L forearm cleans and dressed with telfa bacitracin and 4 in [place]  Reviewed wound care and sx of infection with daughter and gave extra 4x4s that were used to clean wounds to daughter to take with pt  Daughter will take pt to EXF

## 2018-07-06 NOTE — ED Provider Notes (Signed)
[facility] EMERGENCY DEPARTMENT Provider Note   CSN: [id number] Arrival date & time: 07/06/18  1756     History   Chief Complaint Chief Complaint  Patient presents with  . Fall    HPI Nancy Medina is a 84 y.o. female.  HPI Patient presents with fall.  Discharge from hospital today.  Was going to dinner at the assisted living and after dinner she fell when she lost her balance.  Pain to left forearm and left knee.  No loss conscious.  No headache.  No confusion.  She is not on anticoagulation. Past Medical History:  Diagnosis Date  . Achilles tendinitis   . Allergic rhinitis   . Anemia of chronic disease    at least back to 2010  . Anxiety   . Benign paroxysmal positional vertigo   . Cataract   . Cerebrovascular accident ([place])   . Chronic kidney disease (CKD), stage III (moderate) (HCC)   . Depression   . DJD (degenerative joint disease)   . DM (diabetes mellitus) ([place])    type II  . GERD (gastroesophageal reflux disease)   . HTN (hypertension)   . HX: breast cancer   . Hyperlipidemia   . Hypertonicity of bladder   . Knee pain, left   . Leg pain   . Lymphocytic colitis 2008   treated with Lialda short-term  . Obesity   . Osteoporosis, unspecified   . Renal disease    stage III  . Unspecified fall   . Unspecified glaucoma(365.9)     Patient Active Problem List   Diagnosis Date Noted  . Acute renal failure superimposed on stage 3 chronic kidney disease ([place]) 07/06/2018  . Uncontrolled hypertension 07/06/2018  . Fall 07/04/2018  . Scalp laceration 07/04/2018  . Acute bronchitis due to Rhinovirus 03/20/2018  . Diabetic hyperosmolar non-ketotic state ([place]) 03/18/2018  . Acute respiratory failure with hypoxia ([place]) 03/18/2018  . CKD (chronic kidney disease) stage 4, GFR 15-29 ml/min (HCC) 03/18/2018  . Acute lower UTI 02/09/2018  . Nausea 02/09/2018  . Constipation 02/09/2018  . Diarrhea 11/25/2011  . Lymphocytic colitis 06/15/2011  . Esophageal  dysphagia 06/15/2011  . UNSPECIFIED ANEMIA 04/27/2009  . ACHILLES TENDINITIS 10/27/2008  . PERIPHERAL EDEMA 10/14/2008  . ALLERGIC RHINITIS, SEASONAL 03/26/2008  . RENAL DISEASE, CHRONIC, STAGE III 03/26/2008  . DEGENERATIVE DISC DISEASE 01/15/2008  . Type 2 diabetes mellitus with hyperlipidemia ([place]) 12/13/2007  . HYPERLIPIDEMIA 10/11/2006  . OBESITY NOS 10/11/2006  . ANXIETY 10/11/2006  . Depression 10/11/2006  . GLAUCOMA NOS 10/11/2006  . BENIGN POSITIONAL VERTIGO 10/11/2006  . Essential hypertension 10/11/2006  . GERD 10/11/2006  . OVERACTIVE BLADDER 10/11/2006  . OSTEOPOROSIS 10/11/2006  . BREAST CANCER, HX OF 10/11/2006  . CEREBROVASCULAR ACCIDENT, HX OF 10/11/2006    Past Surgical History:  Procedure Laterality Date  . Bladder tack  2000  . BREAST LUMPECTOMY  2004   right  . cataracts    . CHOLECYSTECTOMY  1960  . COLONOSCOPY  07/2007   lymphocytic colitis  . ESOPHAGOGASTRODUODENOSCOPY  06/24/2011   Procedure: ESOPHAGOGASTRODUODENOSCOPY (EGD);  Surgeon: Dorothyann Peng, MD;  Location: [facility] ENDO SUITE;  Service: Endoscopy;  Laterality: N/A;.  Peptic stricture, mild erosive reflux esophagitis, hiatal  . FLEXIBLE SIGMOIDOSCOPY  11/25/2011   MODERATE DIVERTICULOSIS/INTERNAL HEMORRHOIDS  . S/P Hysterectomy  2000   benign reasons  . SAVORY DILATION  06/24/2011   Procedure: SAVORY DILATION;  Surgeon: Dorothyann Peng, MD;  Location: [facility] ENDO SUITE;  Service: Endoscopy;  Laterality: N/A;     OB History   None      Home Medications    Prior to Admission medications   Medication Sig Start Date End Date Taking? Authorizing Provider  acetaminophen (TYLENOL) 500 MG tablet Take 500 mg by mouth every 6 (six) hours as needed for mild pain.   Yes [provider]  aspirin 81 MG tablet Take 81 mg by mouth daily.     Yes [provider]  Cholecalciferol (VITAMIN D) 2000 units CAPS Take 1 capsule by mouth daily.   Yes [provider]  Cholecalciferol (VITAMIN  D-1000 MAX ST) 1000 UNITS tablet Take 5,000 Units by mouth daily.    Yes [provider]  clonazePAM (KLONOPIN) 1 MG tablet Take 1 mg by mouth at bedtime. *May take one tablet every 6 hours as needed for anxiety up to three times daily   Yes [provider]  docusate sodium (COLACE) 100 MG capsule Take 100 mg by mouth 2 (two) times daily as needed for mild constipation.   Yes [provider]  ezetimibe (ZETIA) 10 MG tablet Take 1 tablet by mouth daily. 01/24/18  Yes [provider]  fish oil-omega-3 fatty acids 1000 MG capsule Take 1 g by mouth daily.    Yes [provider]  furosemide (LASIX) 20 MG tablet Take 1 tablet by mouth daily. 03/07/18  Yes [provider]  glimepiride (AMARYL) 1 MG tablet Take 1 tablet by mouth daily. 04/24/18  Yes [provider]  latanoprost (XALATAN) 0.005 % ophthalmic solution Place 1 drop into both eyes daily.  06/02/11  Yes [provider]  loperamide (IMODIUM A-D) 2 MG tablet Take 2 mg by mouth 4 (four) times daily as needed for diarrhea or loose stools.   Yes [provider]  meclizine (ANTIVERT) 25 MG tablet Take 25 mg by mouth 4 (four) times daily as needed for dizziness.   Yes [provider]  omeprazole (PRILOSEC) 40 MG capsule Take 1 capsule by mouth daily.  01/23/18  Yes [provider]  PROAIR HFA 108 (90 Base) MCG/ACT inhaler Take 1-2 puffs by mouth every 6 (six) hours as needed. 03/17/18  Yes [provider]  sitaGLIPtin (JANUVIA) 25 MG tablet Take 1 tablet (25 mg total) by mouth daily. 03/20/18  Yes Tat, Shanon Brow, MD  sulfamethoxazole-trimethoprim (BACTRIM DS,SEPTRA DS) 800-160 MG tablet Take 1 tablet by mouth 2 (two) times daily.   Yes [provider]  timolol (TIMOPTIC) 0.5 % ophthalmic solution Place 1 drop into both eyes every 12 (twelve) hours.  01/17/18  Yes [provider]  vitamin B-12 (CYANOCOBALAMIN) 1000 MCG tablet Take 1 tablet (1,000  mcg total) by mouth daily. 03/20/18  Yes Tat, Shanon Brow, MD  amLODipine (NORVASC) 10 MG tablet Take 1 tablet (10 mg total) by mouth daily. Patient not taking: Reported on 07/06/2018 07/07/18   Louellen Molder, MD    Family History Family History  Problem Relation Age of Onset  . Colon cancer Neg Hx           . Liver disease Neg Hx           . GI problems Neg Hx             Social History Social History   Tobacco Use  . Smoking status: Former Smoker    Packs/day: 1.00    Types: Cigarettes  . Smokeless tobacco: Never Used  . Tobacco comment: 30 yrs ago  Substance Use Topics  .  Alcohol use: No  . Drug use: No     Allergies   Metformin   Review of Systems Review of Systems  Constitutional: Negative for appetite change.  HENT: Negative for congestion.   Respiratory: Negative for shortness of breath.   Cardiovascular: Negative for chest pain.  Gastrointestinal: Negative for abdominal pain.  Genitourinary: Negative for flank pain and hematuria.  Musculoskeletal: Negative for back pain.  Skin: Positive for wound.  Psychiatric/Behavioral: Negative for confusion.     Physical Exam Updated Vital Signs BP (!) 179/84 (BP Location: Left Arm)   Pulse 84   Temp 98.4 F (36.9 C) (Oral)   Resp 18   Wt 72.5 kg   SpO2 100%   BMI 27.44 kg/m   Physical Exam  Constitutional: She appears well-developed.  HENT:  Head: Atraumatic.  Eyes: EOM are normal.  Neck: Neck supple.  Cardiovascular: Normal rate.  Pulmonary/Chest: Effort normal.  Abdominal: There is no tenderness.  Musculoskeletal: She exhibits tenderness.  Iskin tear that is curvilinear to left knee.  Some pain with movement at the knee.  Knee is stable.  Also skin tear to medial aspect of left forearm.  No underlying laceration.  No underlying bony tenderness.  Neurovascular intact in hand.  No tenderness over wrist elbow or shoulder.  No tenderness other extremities or joints.  Skin: Skin is warm. Capillary refill  takes less than 2 seconds.  Psychiatric: She has a normal mood and affect.     ED Treatments / Results  Labs (all labs ordered are listed, but only abnormal results are displayed) Labs Reviewed - No data to display  EKG None  Radiology Dg Knee Complete 4 Views Left  Result Date: 07/06/2018 CLINICAL DATA:  Patient status post fall after being discharged from the hospital. Initial encounter. EXAM: LEFT KNEE - COMPLETE 4+ VIEW COMPARISON:  Left knee radiograph 01/10/2007 FINDINGS: Tricompartmental osteoarthritis with medial, lateral and patellofemoral compartment joint space narrowing and subchondral sclerosis. Small joint effusion. Normal anatomic alignment. No evidence for acute fracture or dislocation. IMPRESSION: No acute osseous abnormality. Marked tricompartmental osteoarthritis. Electronically Signed   By: Lovey Newcomer M.D.   On: 07/06/2018 19:40    Procedures Procedures (including critical care time)  Medications Ordered in ED Medications  bacitracin 500 UNIT/GM ointment (1 application  Given 5/68/61 1945)     Initial Impression / Assessment and Plan / ED Course  I have reviewed the triage vital signs and the nursing notes.  Pertinent labs & imaging results that were available during my care of the patient were reviewed by me and considered in my medical decision making (see chart for details).     Patient with mechanical fall.  X-ray of knee shows only arthritis.  No fracture seen.  No underlying bone tenderness.  Wounds cleaned and treated but does not appear to have anything to suture this point.  Will discharge back to assisted living.  Final Clinical Impressions(s) / ED Diagnoses   Final diagnoses:  Fall, initial encounter  Contusion of left knee, initial encounter  Skin tear of left upper extremity    ED Discharge Orders    None       Davonna Belling, MD 07/06/18 2016

## 2018-07-06 NOTE — ED Notes (Signed)
Noted to have approx 3 inch skintear to left lateral lower arm. Bleeding controlled. Also noted to have skin tear and bruise to left knee

## 2018-07-06 NOTE — ED Notes (Signed)
To Rad 

## 2018-07-06 NOTE — NC FL2 (Signed)
[place] LEVEL OF CARE SCREENING TOOL     IDENTIFICATION  Patient Name: Nancy Medina Birthdate: [birth year] Sex: female Admission Date (Current Location): 07/04/2018  [facility] and [place] Number:  [employer] and Address:  [place] [address] [place]      Provider Number: [id number]  Attending Physician Name and Address:  Louellen Molder, MD  Relative Name and Phone Number:       Current Level of Care: Hospital Recommended Level of Care: [place] Prior Approval Number:    Date Approved/Denied:   PASRR Number:    Discharge Plan: Other (Comment)(ALF)    Current Diagnoses: Patient Active Problem List   Diagnosis Date Noted  . Acute renal failure superimposed on stage 3 chronic kidney disease ([place]) 07/06/2018  . Uncontrolled hypertension 07/06/2018  . Fall 07/04/2018  . Scalp laceration 07/04/2018  . Acute bronchitis due to Rhinovirus 03/20/2018  . Diabetic hyperosmolar non-ketotic state ([place]) 03/18/2018  . Acute respiratory failure with hypoxia ([place]) 03/18/2018  . CKD (chronic kidney disease) stage 4, GFR 15-29 ml/min (HCC) 03/18/2018  . Acute lower UTI 02/09/2018  . Nausea 02/09/2018  . Constipation 02/09/2018  . Diarrhea 11/25/2011  . Lymphocytic colitis 06/15/2011  . Esophageal dysphagia 06/15/2011  . UNSPECIFIED ANEMIA 04/27/2009  . ACHILLES TENDINITIS 10/27/2008  . PERIPHERAL EDEMA 10/14/2008  . ALLERGIC RHINITIS, SEASONAL 03/26/2008  . RENAL DISEASE, CHRONIC, STAGE III 03/26/2008  . DEGENERATIVE DISC DISEASE 01/15/2008  . Type 2 diabetes mellitus with hyperlipidemia ([place]) 12/13/2007  . HYPERLIPIDEMIA 10/11/2006  . OBESITY NOS 10/11/2006  . ANXIETY 10/11/2006  . Depression 10/11/2006  . GLAUCOMA NOS 10/11/2006  . BENIGN POSITIONAL VERTIGO 10/11/2006  . Essential hypertension 10/11/2006  . GERD 10/11/2006  . OVERACTIVE BLADDER 10/11/2006  . OSTEOPOROSIS 10/11/2006  . BREAST  CANCER, HX OF 10/11/2006  . CEREBROVASCULAR ACCIDENT, HX OF 10/11/2006    Orientation RESPIRATION BLADDER Height & Weight     Self  Normal Continent Weight: 159 lb 13.3 oz (72.5 kg) Height:  5\' 4"  (162.6 cm)  BEHAVIORAL SYMPTOMS/MOOD NEUROLOGICAL BOWEL NUTRITION STATUS      Continent Diet(Carb modified/2 g sodium)  AMBULATORY STATUS COMMUNICATION OF NEEDS Skin   Limited Assist Verbally Other (Comment)(laceration head)                       Personal Care Assistance Level of Assistance  Bathing, Feeding, Dressing Bathing Assistance: Limited assistance Feeding assistance: Independent Dressing Assistance: Limited assistance     Functional Limitations Info  Sight, Hearing, Speech Sight Info: Adequate Hearing Info: Adequate Speech Info: Adequate    SPECIAL CARE FACTORS FREQUENCY  PT (By licensed PT)     PT Frequency: 3x/week              Contractures Contractures Info: Not present    Additional Factors Info  Code Status, Allergies, Psychotropic Code Status Info: DNR Allergies Info: Metformin Psychotropic Info: Klonopin         Current Medications (07/06/2018):  This is the current hospital active medication list Current Facility-Administered Medications  Medication Dose Route Frequency Provider Last Rate Last Dose  . 0.9 %  sodium chloride infusion   Intravenous Continuous Phillips Grout, MD   Stopped at 07/05/18 2355  . acetaminophen (TYLENOL) tablet 650 mg  650 mg Oral Q6H PRN Vertis Kelch, NP   650 mg at 07/05/18 2015  . [START ON 07/07/2018] amLODipine (NORVASC) tablet 10 mg  10 mg Oral Daily Dhungel, Nishant, MD      . aspirin chewable tablet 81 mg  81 mg Oral Daily Dhungel, Nishant, MD   81 mg at 07/06/18 0905  . ezetimibe (ZETIA) tablet 10 mg  10 mg Oral Daily Dhungel, Nishant, MD   10 mg at 07/06/18 0905  . hydrALAZINE (APRESOLINE) injection 10 mg  10 mg Intravenous Q6H PRN Dhungel, Nishant, MD   10 mg at 07/05/18 1928  . sodium chloride  flush (NS) 0.9 % injection 3 mL  3 mL Intravenous Q12H Derrill Kay A, MD      . vitamin B-12 (CYANOCOBALAMIN) tablet 1,000 mcg  1,000 mcg Oral Daily Dhungel, Nishant, MD   1,000 mcg at 07/06/18 0904     Discharge Medications: Medication List    STOP taking these medications   sulfamethoxazole-trimethoprim 800-160 MG tablet Commonly known as:  BACTRIM DS,SEPTRA DS     TAKE these medications   acetaminophen 500 MG tablet Commonly known as:  TYLENOL Take 500 mg by mouth every 6 (six) hours as needed for mild pain.   amLODipine 10 MG tablet Commonly known as:  NORVASC Take 1 tablet (10 mg total) by mouth daily. Start taking on:  07/07/2018   aspirin 81 MG tablet Take 81 mg by mouth daily.   clonazePAM 1 MG tablet Commonly known as:  KLONOPIN Take 1 mg by mouth at bedtime. *May take one tablet every 6 hours as needed for anxiety up to three times daily   docusate sodium 100 MG capsule Commonly known as:  COLACE Take 100 mg by mouth 2 (two) times daily as needed for mild constipation.   ezetimibe 10 MG tablet Commonly known as:  ZETIA Take 1 tablet by mouth daily.   fish oil-omega-3 fatty acids 1000 MG capsule Take 1 g by mouth daily.   furosemide 20 MG tablet Commonly known as:  LASIX Take 1 tablet by mouth daily.   glimepiride 1 MG tablet Commonly known as:  AMARYL Take 1 tablet by mouth daily.   latanoprost 0.005 % ophthalmic solution Commonly known as:  XALATAN Place 1 drop into both eyes daily.   loperamide 2 MG tablet Commonly known as:  IMODIUM A-D Take 2 mg by mouth 4 (four) times daily as needed for diarrhea or loose stools.   meclizine 25 MG tablet Commonly known as:  ANTIVERT Take 25 mg by mouth 4 (four) times daily as needed for dizziness.   omeprazole 40 MG capsule Commonly known as:  PRILOSEC Take 1 capsule by mouth daily.   PROAIR HFA 108 (90 Base) MCG/ACT inhaler Generic drug:  albuterol Take 1-2 puffs by mouth every 6  (six) hours as needed.   sitaGLIPtin 25 MG tablet Commonly known as:  JANUVIA Take 1 tablet (25 mg total) by mouth daily.   timolol 0.5 % ophthalmic solution Commonly known as:  TIMOPTIC Place 1 drop into both eyes every 12 (twelve) hours.   vitamin B-12 1000 MCG tablet Commonly known as:  CYANOCOBALAMIN Take 1 tablet (1,000 mcg total) by mouth daily.   Vitamin D 2000 units Caps Take 1 capsule by mouth daily.   VITAMIN D-1000 MAX ST 1000 units tablet Generic drug:  Cholecalciferol Take 5,000 Units by mouth daily    Relevant Imaging Results:  Relevant Lab Results:   Additional Information    Thom Ollinger, Clydene Pugh, LCSW

## 2018-07-06 NOTE — ED Triage Notes (Signed)
Pt discharged from hospital today and was returned to [facility] . Pt fell and received to left forearm and left knee. Pt reports losing balance and fell

## 2018-07-06 NOTE — Care Management (Signed)
[facility] requesting WC for patient. Order placed. Vaughan Basta of [facility] notified.

## 2018-07-06 NOTE — Discharge Summary (Addendum)
Physician Discharge Summary  Nancy Medina JGG:[id number] DOB: 1925/07/01 DOA: 07/04/2018  PCP: Nancy Evens, MD  Admit date: 07/04/2018 Discharge date: 07/06/2018  Admitted From: Assisted living Nancy Medina) Disposition: Return to assisted living with home health RN PT and OT  Recommendations for Outpatient Follow-up:  1. Follow up with PCP in 1-2 weeks.  Please check EKG to evaluate prolonged QT as outpatient. 2. Please check renal function in next 3-4 days.  Home Health: RN, PT and OT Equipment/Devices: None  Discharge Condition: Fair CODE STATUS: DNR Diet recommendation: Carb modified/2 g sodium    Discharge Diagnoses:  Principal Problem:   Acute renal failure superimposed on stage 3 chronic kidney disease (HCC)  Active Problems:   Depression   Essential hypertension   CKD (chronic kidney disease) stage 4, GFR 15-29 ml/min (HCC)   Fall   Scalp laceration   Uncontrolled hypertension  Brief narrative/HPI 84 year old female with history of chronic kidney disease stage II-III (baseline creatinine around 1.5), prior history of stroke, positional vertigo, presented from assisted living with fall.  Daughter reports that she was walking with a walker when she lost her balance and fell on the floor hitting her head and sustained a laceration.  Patient denies any weakness, dizziness or headache but reports feeling weak.  Per daughter she has not had a good appetite in the past few weeks and has been depressed and somewhat confused after death in the family few months ago.  The ED he had elevated blood pressure.  Head CT was negative for acute findings.  Blood work showed acute kidney injury.  Admitted for further management.  Principal Problem:   AKI (acute kidney injury) on chronic kidney disease stage III ([place]) Likely due to dehydration.  Denies use of NSAIDs.  Patient also on Lasix and recently started on Bactrim for unclear reason at the facility.  Baseline creatinine around  1.5.    Bactrim was discontinued and Lasix was held.  Now improved to baseline.   Active Problems: Fall with scalp laceration Suspect mechanical with generalized weakness and dehydration. Received IV fluids.  PT recommends home health.  Uncontrolled hypertension Added amlodipine and dose adjusted.  Resume Lasix upon discharge.  Chronic diastolic CHF Euvolemic.  Continue Zetia and aspirin.  Mild dementia with underlying depression As per daughter at bedside patient has been noted to be more confused in the past few months.  Also patient's son-in-law committed suicide 4 months back after which patient has been increasingly withdrawn and appears depressed.  I discussed on starting her on low-dose antidepressant but daughter declined and would like to discuss with her PCP.   Also given her prolonged QT would not add any antidepressant for now.   Prolonged QT interval (578) Replenished low potassium.    Magnesium normal.  Follow up EKG as outpatient.  Diabetes mellitus type 2, uncontrolled with hyperglycemia. Last A1c of 7.8.  Resume home meds and monitor on sliding scale coverage.     Family Communication  : Daughter at bedside  Disposition Plan  : Return to ALF  Consults  : None  Procedures  : CT head   Discharge Instructions   Allergies as of 07/06/2018      Reactions   Metformin    REACTION: Felt poor      Medication List    STOP taking these medications   sulfamethoxazole-trimethoprim 800-160 MG tablet Commonly known as:  BACTRIM DS,SEPTRA DS     TAKE these medications   acetaminophen 500 MG tablet Commonly  known as:  TYLENOL Take 500 mg by mouth every 6 (six) hours as needed for mild pain.   amLODipine 10 MG tablet Commonly known as:  NORVASC Take 1 tablet (10 mg total) by mouth daily. Start taking on:  07/07/2018   aspirin 81 MG tablet Take 81 mg by mouth daily.   clonazePAM 1 MG tablet Commonly known as:  KLONOPIN Take 1 mg by mouth at  bedtime. *May take one tablet every 6 hours as needed for anxiety up to three times daily   docusate sodium 100 MG capsule Commonly known as:  COLACE Take 100 mg by mouth 2 (two) times daily as needed for mild constipation.   ezetimibe 10 MG tablet Commonly known as:  ZETIA Take 1 tablet by mouth daily.   fish oil-omega-3 fatty acids 1000 MG capsule Take 1 g by mouth daily.   furosemide 20 MG tablet Commonly known as:  LASIX Take 1 tablet by mouth daily.   glimepiride 1 MG tablet Commonly known as:  AMARYL Take 1 tablet by mouth daily.   latanoprost 0.005 % ophthalmic solution Commonly known as:  XALATAN Place 1 drop into both eyes daily.   loperamide 2 MG tablet Commonly known as:  IMODIUM A-D Take 2 mg by mouth 4 (four) times daily as needed for diarrhea or loose stools.   meclizine 25 MG tablet Commonly known as:  ANTIVERT Take 25 mg by mouth 4 (four) times daily as needed for dizziness.   omeprazole 40 MG capsule Commonly known as:  PRILOSEC Take 1 capsule by mouth daily.   PROAIR HFA 108 (90 Base) MCG/ACT inhaler Generic drug:  albuterol Take 1-2 puffs by mouth every 6 (six) hours as needed.   sitaGLIPtin 25 MG tablet Commonly known as:  JANUVIA Take 1 tablet (25 mg total) by mouth daily.   timolol 0.5 % ophthalmic solution Commonly known as:  TIMOPTIC Place 1 drop into both eyes every 12 (twelve) hours.   vitamin B-12 1000 MCG tablet Commonly known as:  CYANOCOBALAMIN Take 1 tablet (1,000 mcg total) by mouth daily.   Vitamin D 2000 units Caps Take 1 capsule by mouth daily.   VITAMIN D-1000 MAX ST 1000 units tablet Generic drug:  Cholecalciferol Take 5,000 Units by mouth daily.      Follow-up Information    Nancy Evens, MD. Schedule an appointment as soon as possible for a visit in 1 week(s).   Specialty:  Family Medicine Contact information: [place] [address] [phone]          Allergies  Allergen  Reactions  . Metformin     REACTION: Felt poor      Procedures/Studies: Dg Chest 2 View  Result Date: 06/26/2018 CLINICAL DATA:  Dizziness and confusion. EXAM: CHEST - 2 VIEW COMPARISON:  05/16/2018 FINDINGS: The heart is enlarged but also accentuated by AP technique. The lungs are free of focal consolidations and pleural effusions. No pulmonary edema. There is mild perihilar peribronchial thickening. IMPRESSION: 1. Cardiomegaly. 2. Bronchitic changes. 3.  No focal acute pulmonary abnormality. Electronically Signed   By: Nolon Nations M.D.   On: 06/26/2018 16:57   Ct Head Wo Contrast  Result Date: 07/04/2018 CLINICAL DATA:  Fall. Hematoma and laceration to back of head. Breast cancer and hypertension. EXAM: CT HEAD WITHOUT CONTRAST TECHNIQUE: Contiguous axial images were obtained from the base of the skull through the vertex without intravenous contrast. COMPARISON:  06/26/2018 MRI.  05/09/2018 CT. FINDINGS: Brain: Advanced cerebral atrophy.  Persistent marked low density in the periventricular white matter likely related to small vessel disease. Encephalomalacia involving the left parietal and occipital regions, similar. There is also remote infarct or infarcts involving the left cerebellar hemisphere. No hemorrhage, hydrocephalus, intra-axial, or extra-axial fluid collection. Vascular: Intracranial atherosclerosis. Skull: Left parietal scalp soft tissue swelling and a staple. Hyperostosis frontalis interna. No skull fractures. Sinuses/Orbits: Surgical changes about the right globe. Clear paranasal sinuses and mastoid air cells. Other: None. IMPRESSION: 1. Left-sided scalp soft tissue swelling, without acute intracranial abnormality. 2.  Cerebral atrophy and small vessel ischemic change. 3. Remote infarcts, including within the left parietal/occipital lobe. Electronically Signed   By: Abigail Miyamoto M.D.   On: 07/04/2018 17:31   Mr Brain Wo Contrast  Result Date: 06/26/2018 CLINICAL DATA:  Focal  neuro deficit for greater than 6 hours. Stroke suspected. New onset confusion beginning today. Patient is walking to the left. EXAM: MRI HEAD WITHOUT CONTRAST TECHNIQUE: Multiplanar, multiecho pulse sequences of the brain and surrounding structures were obtained without intravenous contrast. COMPARISON:  MRI brain 05/29/2018 FINDINGS: Brain: Chronic posterior left parietal and occipital lobe encephalomalacia is again seen. Advanced atrophy and diffuse white matter disease bilaterally is similar to the prior exam. Dilated perivascular spaces are present throughout the basal ganglia. Remote lacunar infarcts of the basal ganglia bilaterally are stable. Remote lacunar infarcts involving the cerebellum are stable. No acute infarct, hemorrhage, or mass lesion is present. The ventricles are proportionate to the degree of atrophy. No significant extra-axial fluid collection is present. Vascular: Flow is present in the major intracranial arteries. Skull and upper cervical spine: The craniocervical junction is within normal limits. The upper cervical spine is unremarkable. Marrow signal is normal. Sinuses/Orbits: The paranasal sinuses and mastoid air cells are clear. Bilateral lens replacements are present. Globes and orbits are otherwise within limits. IMPRESSION: 1. Stable Vance atrophy and diffuse white matter disease. This likely reflects the sequela of chronic microvascular ischemia. 2. Stable chronic encephalomalacia involving the posterior inferior left parietal and occipital lobe. 3. Stable remote lacunar infarcts involving the basal ganglia and cerebellum bilaterally. Electronically Signed   By: San Morelle M.D.   On: 06/26/2018 16:46       Subjective: Denies any specific symptoms.  No overnight events.  Discharge Exam: Vitals:   07/06/18 0200 07/06/18 0605  BP: (!) 194/89 (!) 164/111  Pulse: 90 86  Resp: 18 18  Temp: 97.7 F (36.5 C) 98.4 F (36.9 C)  SpO2: 97% 97%   Vitals:   07/05/18  2018 07/06/18 0200 07/06/18 0500 07/06/18 0605  BP: (!) 173/68 (!) 194/89  (!) 164/111  Pulse: 87 90  86  Resp: 16 18  18   Temp: 98.7 F (37.1 C) 97.7 F (36.5 C)  98.4 F (36.9 C)  TempSrc: Oral Oral  Oral  SpO2: 96% 97%  97%  Weight:   72.5 kg   Height:        General: Elderly female not in distress HEENT: Moist mucosa, supple neck, posterior scalp laceration appears clean. Chest: Clear bilaterally CVS: Normal S1 and S2, no murmurs rubs or gallop GI: Soft, nondistended, nontender Musculoskeletal: Warm, no edema   The results of significant diagnostics from this hospitalization (including imaging, microbiology, ancillary and laboratory) are listed below for reference.     Microbiology: Recent Results (from the past 240 hour(s))  MRSA PCR Screening     Status: None   Collection Time: 07/04/18 10:00 PM  Result Value Ref Range Status   MRSA  by PCR NEGATIVE NEGATIVE Final    Comment:        The GeneXpert MRSA Assay (FDA approved for NASAL specimens only), is one component of a comprehensive MRSA colonization surveillance program. It is not intended to diagnose MRSA infection nor to guide or monitor treatment for MRSA infections. Performed at [facility], [address] [place], [place] [address]      Labs: BNP (last 3 results) Recent Labs    03/18/18 1037 05/18/18 1344 06/27/18 1543  BNP 608.0* 329.0* 160.1*   Basic Metabolic Panel: Recent Labs  Lab 07/04/18 1624 07/05/18 1207 07/05/18 1222 07/06/18 0833  NA 141 139  --  138  K 3.4* 3.5  --  3.7  CL 106 108  --  107  CO2 26 23  --  22  GLUCOSE 122* 177*  --  164*  BUN 31* 25*  --  19  CREATININE 2.29* 1.71*  --  1.43*  CALCIUM 8.7* 8.3*  --  8.6*  MG  --   --  1.8  --    Liver Function Tests: No results for input(s): AST, ALT, ALKPHOS, BILITOT, PROT, ALBUMIN in the last 168 hours. No results for input(s): LIPASE, AMYLASE in the last 168 hours. No results for input(s): AMMONIA in the last 168  hours. CBC: Recent Labs  Lab 07/04/18 1624 07/05/18 1207  WBC 6.4 6.3  HGB 9.6* 9.9*  HCT 30.5* 31.4*  MCV 88.4 88.5  PLT 189 198   Cardiac Enzymes: No results for input(s): CKTOTAL, CKMB, CKMBINDEX, TROPONINI in the last 168 hours. BNP: Invalid input(s): POCBNP CBG: Recent Labs  Lab 07/04/18 2049 07/05/18 0518 07/06/18 0615 07/06/18 0746  GLUCAP 122* 103* 171* 163*   D-Dimer No results for input(s): DDIMER in the last 72 hours. Hgb A1c No results for input(s): HGBA1C in the last 72 hours. Lipid Profile No results for input(s): CHOL, HDL, LDLCALC, TRIG, CHOLHDL, LDLDIRECT in the last 72 hours. Thyroid function studies No results for input(s): TSH, T4TOTAL, T3FREE, THYROIDAB in the last 72 hours.  Invalid input(s): FREET3 Anemia work up No results for input(s): VITAMINB12, FOLATE, FERRITIN, TIBC, IRON, RETICCTPCT in the last 72 hours. Urinalysis    Component Value Date/Time   COLORURINE YELLOW 06/26/2018 1329   APPEARANCEUR HAZY (A) 06/26/2018 1329   LABSPEC 1.016 06/26/2018 1329   PHURINE 5.0 06/26/2018 1329   GLUCOSEU NEGATIVE 06/26/2018 1329   HGBUR SMALL (A) 06/26/2018 1329   HGBUR negative 06/04/2008 0811   BILIRUBINUR NEGATIVE 06/26/2018 1329   KETONESUR NEGATIVE 06/26/2018 1329   PROTEINUR 100 (A) 06/26/2018 1329   UROBILINOGEN 0.2 06/04/2008 0811   NITRITE NEGATIVE 06/26/2018 1329   LEUKOCYTESUR NEGATIVE 06/26/2018 1329   Sepsis Labs Invalid input(s): PROCALCITONIN,  WBC,  LACTICIDVEN Microbiology Recent Results (from the past 240 hour(s))  MRSA PCR Screening     Status: None   Collection Time: 07/04/18 10:00 PM  Result Value Ref Range Status   MRSA by PCR NEGATIVE NEGATIVE Final    Comment:        The GeneXpert MRSA Assay (FDA approved for NASAL specimens only), is one component of a comprehensive MRSA colonization surveillance program. It is not intended to diagnose MRSA infection nor to guide or monitor treatment for MRSA  infections. Performed at [facility], [address] [place], [place] [address]      Time coordinating discharge: 35 minutes  SIGNED:   Louellen Molder, MD  [facility] 07/06/2018, 11:16 AM Pager   If 7PM-7AM,  please contact night-coverage [email or web address] Password [email or web address]

## 2018-07-06 NOTE — Clinical Social Work Note (Signed)
Clinical Social Work Assessment  Patient Details  Name: Nancy Medina MRN: [id number] Date of Birth: May 19, 1925  Date of referral:  07/06/18               Reason for consult:  Other (Comment Required)(From Robley Fries)                Permission sought to share information with:    Permission granted to share information::     Name::        Agency::  Message left for [facility] advising of discharge later today and that daughter will transport.   Relationship::     Contact Information:  Mickie Kay, daughter  Housing/Transportation Living arrangements for the past 2 months:  ([facility]) Source of Information:  Adult Children Patient Interpreter Needed:  None Criminal Activity/Legal Involvement Pertinent to Current Situation/Hospitalization:  No - Comment as needed Significant Relationships:  Adult Children Lives with:  Facility Resident Do you feel safe going back to the place where you live?  Yes Need for family participation in patient care:  Yes (Comment)  Care giving concerns:  None identified.   Social Worker assessment / plan:  Pt is a 84 year old female admitted from [place]. Plan is for return to the ALF at today.  Pt has lived at [facility] since September 2018. She has a supportive family. Pt ambulates independently with a cane. PT is recommending HH PT at dc. Daughter, Pam, advised that she will pick patient up today at discharge and transport back to facility.   Message left for [facility] advising of discharge later today and transport plan.   Employment status:  Retired Nurse, adult PT Recommendations:  Home with [place] / Referral to community resources:     Patient/Family's Response to care:  Family is agreeable to patient return to [facility].   Patient/Family's Understanding of and Emotional Response to Diagnosis, Current Treatment, and Prognosis:  Patient's family understand patient's  diagnosis, treatment and prognosis.   Emotional Assessment Appearance:  Appears stated age Attitude/Demeanor/Rapport:    Affect (typically observed):  Unable to Assess Orientation:  Oriented to Self Alcohol / Substance use:  Not Applicable Psych involvement (Current and /or in the community):  No (Comment)  Discharge Needs  Concerns to be addressed:  Other (Comment Required(return to [facility] ) Readmission within the last 30 days:  No Current discharge risk:  None Barriers to Discharge:  No Barriers Identified   Ihor Gully, LCSW 07/06/2018, 11:00 AM

## 2018-07-06 NOTE — ED Notes (Addendum)
Pt discharged from [facility] today  Reports lost her balance and fell  Now with 3 in tear to her L forearm and 1/2 C shaped flap lac to knee  Pt resides at [facility]

## 2018-07-09 ENCOUNTER — Other Ambulatory Visit: Payer: Self-pay

## 2018-07-09 NOTE — Patient Outreach (Signed)
[facility]) Care Management  07/09/2018  SULEMA BRAID January 21, 1925 [id number]  Patient is in an [place].  Will not make outreach attempt to complete discharge medication review.   Joetta Manners, PharmD Clinical Pharmacist [place] [phone]

## 2018-07-10 ENCOUNTER — Other Ambulatory Visit (HOSPITAL_COMMUNITY)
Admission: RE | Admit: 2018-07-10 | Discharge: 2018-07-10 | Disposition: A | Discharge status: Home / Self Care | Payer: PPO | Source: Other Acute Inpatient Hospital | Attending: Family Medicine | Admitting: Family Medicine

## 2018-07-10 DIAGNOSIS — I1310 Hypertensive heart and chronic kidney disease without heart failure, with stage 1 through stage 4 chronic kidney disease, or unspecified chronic kidney disease: Secondary | ICD-10-CM | POA: Insufficient documentation

## 2018-07-10 LAB — RENAL FUNCTION PANEL
ALBUMIN: 3.4 g/dL — AB (ref 3.5–5.0)
Anion gap: 8 (ref 5–15)
BUN: 31 mg/dL — AB (ref 8–23)
CALCIUM: 9.2 mg/dL (ref 8.9–10.3)
CO2: 27 mmol/L (ref 22–32)
Chloride: 104 mmol/L (ref 98–111)
Creatinine, Ser: 1.72 mg/dL — ABNORMAL HIGH (ref 0.44–1.00)
GFR, EST AFRICAN AMERICAN: 29 mL/min — AB (ref >60)
GFR, EST NON AFRICAN AMERICAN: 25 mL/min — AB (ref >60)
Glucose, Bld: 141 mg/dL — ABNORMAL HIGH (ref 70–99)
PHOSPHORUS: 4.4 mg/dL (ref 2.5–4.6)
Potassium: 4.1 mmol/L (ref 3.5–5.1)
Sodium: 139 mmol/L (ref 135–145)

## 2018-07-11 DIAGNOSIS — R531 Weakness: Secondary | ICD-10-CM | POA: Diagnosis not present

## 2018-07-11 DIAGNOSIS — S0191XA Laceration without foreign body of unspecified part of head, initial encounter: Secondary | ICD-10-CM | POA: Diagnosis not present

## 2018-07-11 DIAGNOSIS — Z9181 History of falling: Secondary | ICD-10-CM | POA: Diagnosis not present

## 2018-07-11 DIAGNOSIS — F0390 Unspecified dementia without behavioral disturbance: Secondary | ICD-10-CM | POA: Diagnosis not present

## 2018-07-16 DIAGNOSIS — R609 Edema, unspecified: Secondary | ICD-10-CM | POA: Diagnosis not present

## 2018-07-16 DIAGNOSIS — I509 Heart failure, unspecified: Secondary | ICD-10-CM | POA: Diagnosis not present

## 2018-07-16 DIAGNOSIS — R531 Weakness: Secondary | ICD-10-CM | POA: Diagnosis not present

## 2018-07-16 DIAGNOSIS — Z9181 History of falling: Secondary | ICD-10-CM | POA: Diagnosis not present

## 2018-07-24 DIAGNOSIS — M5136 Other intervertebral disc degeneration, lumbar region: Secondary | ICD-10-CM | POA: Diagnosis not present

## 2018-07-24 DIAGNOSIS — I69393 Ataxia following cerebral infarction: Secondary | ICD-10-CM | POA: Diagnosis not present

## 2018-07-24 DIAGNOSIS — Z7982 Long term (current) use of aspirin: Secondary | ICD-10-CM | POA: Diagnosis not present

## 2018-07-24 DIAGNOSIS — E1122 Type 2 diabetes mellitus with diabetic chronic kidney disease: Secondary | ICD-10-CM | POA: Diagnosis not present

## 2018-07-24 DIAGNOSIS — Z9181 History of falling: Secondary | ICD-10-CM | POA: Diagnosis not present

## 2018-07-24 DIAGNOSIS — Z7951 Long term (current) use of inhaled steroids: Secondary | ICD-10-CM | POA: Diagnosis not present

## 2018-07-24 DIAGNOSIS — I5032 Chronic diastolic (congestive) heart failure: Secondary | ICD-10-CM | POA: Diagnosis not present

## 2018-07-24 DIAGNOSIS — M810 Age-related osteoporosis without current pathological fracture: Secondary | ICD-10-CM | POA: Diagnosis not present

## 2018-07-24 DIAGNOSIS — S51812D Laceration without foreign body of left forearm, subsequent encounter: Secondary | ICD-10-CM | POA: Diagnosis not present

## 2018-07-24 DIAGNOSIS — M6281 Muscle weakness (generalized): Secondary | ICD-10-CM | POA: Diagnosis not present

## 2018-07-24 DIAGNOSIS — H409 Unspecified glaucoma: Secondary | ICD-10-CM | POA: Diagnosis not present

## 2018-07-24 DIAGNOSIS — I1310 Hypertensive heart and chronic kidney disease without heart failure, with stage 1 through stage 4 chronic kidney disease, or unspecified chronic kidney disease: Secondary | ICD-10-CM | POA: Diagnosis not present

## 2018-07-24 DIAGNOSIS — Z7984 Long term (current) use of oral hypoglycemic drugs: Secondary | ICD-10-CM | POA: Diagnosis not present

## 2018-07-24 DIAGNOSIS — Z853 Personal history of malignant neoplasm of breast: Secondary | ICD-10-CM | POA: Diagnosis not present

## 2018-07-24 DIAGNOSIS — I69398 Other sequelae of cerebral infarction: Secondary | ICD-10-CM | POA: Diagnosis not present

## 2018-07-24 DIAGNOSIS — Z87440 Personal history of urinary (tract) infections: Secondary | ICD-10-CM | POA: Diagnosis not present

## 2018-07-24 DIAGNOSIS — N184 Chronic kidney disease, stage 4 (severe): Secondary | ICD-10-CM | POA: Diagnosis not present

## 2018-07-24 DIAGNOSIS — W19XXXD Unspecified fall, subsequent encounter: Secondary | ICD-10-CM | POA: Diagnosis not present

## 2018-08-03 DIAGNOSIS — Z9181 History of falling: Secondary | ICD-10-CM | POA: Diagnosis not present

## 2018-08-03 DIAGNOSIS — S0191XD Laceration without foreign body of unspecified part of head, subsequent encounter: Secondary | ICD-10-CM | POA: Diagnosis not present

## 2018-08-03 DIAGNOSIS — F0390 Unspecified dementia without behavioral disturbance: Secondary | ICD-10-CM | POA: Diagnosis not present

## 2018-08-07 ENCOUNTER — Other Ambulatory Visit: Payer: Self-pay

## 2018-08-07 ENCOUNTER — Emergency Department (HOSPITAL_COMMUNITY): Payer: PPO

## 2018-08-07 ENCOUNTER — Emergency Department (HOSPITAL_COMMUNITY)
Admission: EM | Admit: 2018-08-07 | Discharge: 2018-08-08 | Disposition: A | Discharge status: Home / Self Care | Payer: PPO | Attending: Emergency Medicine | Admitting: Emergency Medicine

## 2018-08-07 ENCOUNTER — Encounter (HOSPITAL_COMMUNITY): Payer: Self-pay | Admitting: *Deleted

## 2018-08-07 DIAGNOSIS — M48061 Spinal stenosis, lumbar region without neurogenic claudication: Secondary | ICD-10-CM | POA: Diagnosis not present

## 2018-08-07 DIAGNOSIS — M545 Low back pain, unspecified: Secondary | ICD-10-CM

## 2018-08-07 DIAGNOSIS — Z853 Personal history of malignant neoplasm of breast: Secondary | ICD-10-CM | POA: Insufficient documentation

## 2018-08-07 DIAGNOSIS — Z7982 Long term (current) use of aspirin: Secondary | ICD-10-CM | POA: Diagnosis not present

## 2018-08-07 DIAGNOSIS — M79605 Pain in left leg: Secondary | ICD-10-CM | POA: Insufficient documentation

## 2018-08-07 DIAGNOSIS — E1122 Type 2 diabetes mellitus with diabetic chronic kidney disease: Secondary | ICD-10-CM | POA: Diagnosis not present

## 2018-08-07 DIAGNOSIS — Z87891 Personal history of nicotine dependence: Secondary | ICD-10-CM | POA: Insufficient documentation

## 2018-08-07 DIAGNOSIS — I129 Hypertensive chronic kidney disease with stage 1 through stage 4 chronic kidney disease, or unspecified chronic kidney disease: Secondary | ICD-10-CM | POA: Diagnosis not present

## 2018-08-07 DIAGNOSIS — Z9049 Acquired absence of other specified parts of digestive tract: Secondary | ICD-10-CM | POA: Insufficient documentation

## 2018-08-07 DIAGNOSIS — N183 Chronic kidney disease, stage 3 (moderate): Secondary | ICD-10-CM | POA: Diagnosis not present

## 2018-08-07 DIAGNOSIS — N200 Calculus of kidney: Secondary | ICD-10-CM | POA: Diagnosis not present

## 2018-08-07 LAB — CBC
HEMATOCRIT: 33 % — AB (ref 36.0–46.0)
Hemoglobin: 10.5 g/dL — ABNORMAL LOW (ref 12.0–15.0)
MCH: 27.6 pg (ref 26.0–34.0)
MCHC: 31.8 g/dL (ref 30.0–36.0)
MCV: 86.8 fL (ref 78.0–100.0)
PLATELETS: 261 10*3/uL (ref 150–400)
RBC: 3.8 MIL/uL — AB (ref 3.87–5.11)
RDW: 14.7 % (ref 11.5–15.5)
WBC: 6.5 10*3/uL (ref 4.0–10.5)

## 2018-08-07 LAB — BASIC METABOLIC PANEL
ANION GAP: 8 (ref 5–15)
BUN: 38 mg/dL — ABNORMAL HIGH (ref 8–23)
CO2: 27 mmol/L (ref 22–32)
Calcium: 8.9 mg/dL (ref 8.9–10.3)
Chloride: 104 mmol/L (ref 98–111)
Creatinine, Ser: 1.88 mg/dL — ABNORMAL HIGH (ref 0.44–1.00)
GFR calc Af Amer: 26 mL/min — ABNORMAL LOW (ref >60)
GFR, EST NON AFRICAN AMERICAN: 22 mL/min — AB (ref >60)
GLUCOSE: 199 mg/dL — AB (ref 70–99)
POTASSIUM: 3.7 mmol/L (ref 3.5–5.1)
Sodium: 139 mmol/L (ref 135–145)

## 2018-08-07 LAB — URINALYSIS, ROUTINE W REFLEX MICROSCOPIC
BILIRUBIN URINE: NEGATIVE
GLUCOSE, UA: NEGATIVE mg/dL
Hgb urine dipstick: NEGATIVE
KETONES UR: NEGATIVE mg/dL
LEUKOCYTES UA: NEGATIVE
Nitrite: NEGATIVE
PH: 5 (ref 5.0–8.0)
Protein, ur: 100 mg/dL — AB
Specific Gravity, Urine: 1.016 (ref 1.005–1.030)

## 2018-08-07 MED ORDER — MORPHINE SULFATE (PF) 4 MG/ML IV SOLN
2.0000 mg | Freq: Once | INTRAVENOUS | Status: AC
  Administered 2018-08-07: 2 mg via INTRAVENOUS
  Filled 2018-08-07: qty 1

## 2018-08-07 MED ORDER — METHOCARBAMOL 500 MG PO TABS: 500.0000 mg | ORAL_TABLET | Freq: Three times a day (TID) | ORAL | 0 refills | Status: DC | PRN

## 2018-08-07 NOTE — ED Triage Notes (Signed)
Pt c/o lower back pain and left hip pain that radiates down left leg that started 4 days ago, denies any injury,

## 2018-08-07 NOTE — ED Provider Notes (Signed)
Pt left at change of shift to get results of her UA.  Results for orders placed or performed during the hospital encounter of 08/07/18  Urinalysis, Routine w reflex microscopic  Result Value Ref Range   Color, Urine YELLOW YELLOW   APPearance CLEAR CLEAR   Specific Gravity, Urine 1.016 1.005 - 1.030   pH 5.0 5.0 - 8.0   Glucose, UA NEGATIVE NEGATIVE mg/dL   Hgb urine dipstick NEGATIVE NEGATIVE   Bilirubin Urine NEGATIVE NEGATIVE   Ketones, ur NEGATIVE NEGATIVE mg/dL   Protein, ur 100 (A) NEGATIVE mg/dL   Nitrite NEGATIVE NEGATIVE   Leukocytes, UA NEGATIVE NEGATIVE   RBC / HPF 0-5 0 - 5 RBC/hpf   WBC, UA 0-5 0 - 5 WBC/hpf   Bacteria, UA RARE (A) NONE SEEN   Squamous Epithelial / LPF 0-5 0 - 5   Mucus PRESENT     Pt was informed of her UA results which were normal and discharged.   When the triage nurse was trying to discharge patient the daughter and caregiver were expressing concern because they thought she was "wheezing".  Nurse observed she coughed and it seemed clear.  When I listen to patient her lungs are clear.  Rolland Porter, MD, Barbette Or, MD 08/07/18 [phone]

## 2018-08-07 NOTE — ED Provider Notes (Signed)
[facility] EMERGENCY DEPARTMENT Provider Note   CSN: [id number] Arrival date & time: 08/07/18  2025     History   Chief Complaint Chief Complaint  Patient presents with  . Back Pain    HPI Nancy Medina is a 84 y.o. female.  HPI  The patient is a 84 year old female, she is currently living at [facility], she has multiple medical problems including high blood pressure diabetes history of stroke, anemia of chronic disease and has renal disease stage III.  She presents to the hospital today with a complaint of left-sided leg pain and left-sided back pain, apparently this is been going on for several days, the family member and caregiver who escort the patient to the hospital report that she has recently started doing physical therapy because of some falls and this correlates with the start of that.  She has had no changes in bowel movements, urination, no fevers chills nausea or vomiting but has had some left lower quadrant abdominal pain as well as the left lower back buttock and left thigh pain.  She denies swelling, denies injuries, denies falls, denies numbness or weakness.  This does seem to get worse when she tries to use her leg, she does walk back and forth from the bathroom to the bedroom to the kitchen etc.  She has no prior history of back surgery.   Past Medical History:  Diagnosis Date  . Achilles tendinitis   . Allergic rhinitis   . Anemia of chronic disease    at least back to 2010  . Anxiety   . Benign paroxysmal positional vertigo   . Cataract   . Cerebrovascular accident ([place])   . Chronic kidney disease (CKD), stage III (moderate) (HCC)   . Depression   . DJD (degenerative joint disease)   . DM (diabetes mellitus) ([place])    type II  . GERD (gastroesophageal reflux disease)   . HTN (hypertension)   . HX: breast cancer   . Hyperlipidemia   . Hypertonicity of bladder   . Knee pain, left   . Leg pain   . Lymphocytic colitis 2008   treated  with L[place]da short-term  . Obesity   . Osteoporosis, unspecified   . Renal disease    stage III  . Unspecified fall   . Unspecified glaucoma(365.9)     Patient Active Problem List   Diagnosis Date Noted  . Acute renal failure superimposed on stage 3 chronic kidney disease ([place]) 07/06/2018  . Uncontrolled hypertension 07/06/2018  . Fall 07/04/2018  . Scalp laceration 07/04/2018  . Acute bronchitis due to Rhinovirus 03/20/2018  . Diabetic hyperosmolar non-ketotic state ([place]) 03/18/2018  . Acute respiratory failure with hypoxia ([place]) 03/18/2018  . CKD (chronic kidney disease) stage 4, GFR 15-29 ml/min (HCC) 03/18/2018  . Acute lower UTI 02/09/2018  . Nausea 02/09/2018  . Constipation 02/09/2018  . Diarrhea 11/25/2011  . Lymphocytic colitis 06/15/2011  . Esophageal dysphagia 06/15/2011  . UNSPECIFIED ANEMIA 04/27/2009  . ACHILLES TENDINITIS 10/27/2008  . PERIPHERAL EDEMA 10/14/2008  . ALLERGIC RHINITIS, SEASONAL 03/26/2008  . RENAL DISEASE, CHRONIC, STAGE III 03/26/2008  . DEGENERATIVE DISC DISEASE 01/15/2008  . Type 2 diabetes mellitus with hyperlipidemia ([place]) 12/13/2007  . HYPERLIPIDEMIA 10/11/2006  . OBESITY NOS 10/11/2006  . ANXIETY 10/11/2006  . Depression 10/11/2006  . GLAUCOMA NOS 10/11/2006  . BENIGN POSITIONAL VERTIGO 10/11/2006  . Essential hypertension 10/11/2006  . GERD 10/11/2006  . OVERACTIVE BLADDER 10/11/2006  . OSTEOPOROSIS 10/11/2006  .  BREAST CANCER, HX OF 10/11/2006  . CEREBROVASCULAR ACCIDENT, HX OF 10/11/2006    Past Surgical History:  Procedure Laterality Date  . Bladder tack  2000  . BREAST LUMPECTOMY  2004   right  . cataracts    . CHOLECYSTECTOMY  1960  . COLONOSCOPY  07/2007   lymphocytic colitis  . ESOPHAGOGASTRODUODENOSCOPY  06/24/2011   Procedure: ESOPHAGOGASTRODUODENOSCOPY (EGD);  Surgeon: Dorothyann Peng, MD;  Location: [facility];  Service: Endoscopy;  Laterality: N/A;.  Peptic stricture, mild erosive reflux esophagitis, hiatal  .  FLEXIBLE SIGMOIDOSCOPY  11/25/2011   MODERATE DIVERTICULOSIS/INTERNAL HEMORRHOIDS  . S/P Hysterectomy  2000   benign reasons  . SAVORY DILATION  06/24/2011   Procedure: SAVORY DILATION;  Surgeon: Dorothyann Peng, MD;  Location: [facility];  Service: Endoscopy;  Laterality: N/A;     OB History   None      Home Medications    Prior to Admission medications   Medication Sig Start Date End Date Taking? Authorizing Provider  acetaminophen (TYLENOL) 500 MG tablet Take 500 mg by mouth every 6 (six) hours as needed for mild pain.   Yes [provider]  amLODipine (NORVASC) 10 MG tablet Take 1 tablet (10 mg total) by mouth daily. 07/07/18  Yes Dhungel, Nishant, MD  aspirin 81 MG tablet Take 81 mg by mouth daily.     Yes [provider]  Cholecalciferol (VITAMIN D) 2000 units CAPS Take 1 capsule by mouth daily.   Yes [provider]  Cholecalciferol (VITAMIN D-1000 MAX ST) 1000 UNITS tablet Take 5,000 Units by mouth daily.    Yes [provider]  clonazePAM (KLONOPIN) 1 MG tablet Take 1 mg by mouth at bedtime. *May take one tablet every 6 hours as needed for anxiety up to three times daily   Yes [provider]  docusate sodium (COLACE) 100 MG capsule Take 100 mg by mouth 2 (two) times daily as needed for mild constipation.   Yes [provider]  ezetimibe (ZETIA) 10 MG tablet Take 1 tablet by mouth daily. 01/24/18  Yes [provider]  fish oil-omega-3 fatty acids 1000 MG capsule Take 1 g by mouth daily.    Yes [provider]  furosemide (LASIX) 20 MG tablet Take 1 tablet by mouth daily. 03/07/18  Yes [provider]  glimepiride (AMARYL) 1 MG tablet Take 1 tablet by mouth daily. 04/24/18  Yes [provider]  latanoprost (XALATAN) 0.005 % ophthalmic solution Place 1 drop into both eyes daily.  06/02/11  Yes [provider]  loperamide (IMODIUM A-D) 2 MG tablet Take 2 mg by mouth 4 (four) times daily as  needed for diarrhea or loose stools.   Yes [provider]  meclizine (ANTIVERT) 25 MG tablet Take 25 mg by mouth 4 (four) times daily as needed for dizziness.   Yes [provider]  omeprazole (PRILOSEC) 40 MG capsule Take 1 capsule by mouth daily.  01/23/18  Yes [provider]  PROAIR HFA 108 (90 Base) MCG/ACT inhaler Take 1-2 puffs by mouth every 6 (six) hours as needed. 03/17/18  Yes [provider]  sitaGLIPtin (JANUVIA) 25 MG tablet Take 1 tablet (25 mg total) by mouth daily. 03/20/18  Yes Tat, Shanon Brow, MD  timolol (TIMOPTIC) 0.5 % ophthalmic solution Place 1 drop into both eyes every 12 (twelve) hours.  01/17/18  Yes [provider]  vitamin B-12 (CYANOCOBALAMIN) 1000 MCG tablet Take 1 tablet (1,000 mcg total) by mouth daily.  03/20/18  Yes TatShanon Brow, MD    Family History Family History  Problem Relation Age of Onset  . Colon cancer Neg Hx           . Liver disease Neg Hx           . GI problems Neg Hx             Social History Social History   Tobacco Use  . Smoking status: Former Smoker    Packs/day: 1.00    Types: Cigarettes  . Smokeless tobacco: Never Used  . Tobacco comment: 30 yrs ago  Substance Use Topics  . Alcohol use: No  . Drug use: No     Allergies   Metformin   Review of Systems Review of Systems  Constitutional: Negative for chills and fever.  HENT: Negative for sore throat.   Eyes: Negative for visual disturbance.  Respiratory: Negative for cough and shortness of breath.   Cardiovascular: Negative for chest pain.  Gastrointestinal: Positive for abdominal pain. Negative for diarrhea, nausea and vomiting.  Genitourinary: Negative for dysuria and frequency.  Musculoskeletal: Positive for back pain. Negative for neck pain.  Skin: Negative for rash.  Neurological: Negative for weakness, numbness and headaches.  Hematological: Negative for adenopathy.  Psychiatric/Behavioral: Negative for behavioral problems.       Physical Exam Updated Vital Signs BP (!) 144/60   Pulse 69   Temp (!) 97.5 F (36.4 C) (Oral)   Resp 20   Ht 1.575 m (5\' 2" )   Wt 73 kg   SpO2 92%   BMI 29.45 kg/m   Physical Exam  Constitutional: She appears well-developed and well-nourished. No distress.  HENT:  Head: Normocephalic and atraumatic.  Mouth/Throat: Oropharynx is clear and moist. No oropharyngeal exudate.  Eyes: Pupils are equal, round, and reactive to light. Conjunctivae and EOM are normal. Right eye exhibits no discharge. Left eye exhibits no discharge. No scleral icterus.  Neck: Normal range of motion. Neck supple. No JVD present. No thyromegaly present.  Cardiovascular: Normal rate, regular rhythm and intact distal pulses. Exam reveals no gallop and no friction rub.  Murmur ( soft murmur) heard. Pulmonary/Chest: Effort normal and breath sounds normal. No respiratory distress. She has no wheezes. She has no rales.  Abdominal: Soft. Bowel sounds are normal. She exhibits no distension and no mass. There is tenderness (TTP in the LLQ - mild, no guarding).  Musculoskeletal: Normal range of motion. She exhibits no edema or tenderness.  LEs appear symmetrical - overweight - no pitting edema.  She has dec ROM of the L hip actively but with passive ROM she is able to Range without pain - when she tries to lower her leg from SLR it causes increased pain in the anterior proximal thigh - there is also mild ttp in the L flank and into the L buttock and posterior thigh.    Lymphadenopathy:    She has no cervical adenopathy.  Neurological: She is alert. Coordination normal.  Awake alert and following commands without difficulty.  Limited by pain when testing strength in legs, no facial droop and no arm weakness.  Skin: Skin is warm and dry. No rash noted. No erythema.  No rashes at all on the skin - no signs of zoster  Psychiatric: She has a normal mood and affect. Her behavior is normal.  Nursing note and vitals  reviewed.    ED Treatments / Results  Labs (all labs ordered are listed, but only abnormal results  are displayed) Labs Reviewed  CBC - Abnormal; Notable for the following components:      Result Value   RBC 3.80 (*)    Hemoglobin 10.5 (*)    HCT 33.0 (*)    All other components within normal limits  BASIC METABOLIC PANEL - Abnormal; Notable for the following components:   Glucose, Bld 199 (*)    BUN 38 (*)    Creatinine, Ser 1.88 (*)    GFR calc non Af Amer 22 (*)    GFR calc Af Amer 26 (*)    All other components within normal limits  URINALYSIS, ROUTINE W REFLEX MICROSCOPIC    EKG None  Radiology Ct Abdomen Pelvis Wo Contrast  Result Date: 08/07/2018 CLINICAL DATA:  Low back pain and LEFT hip pain for 4 days. No injury. History of cholecystectomy, breast cancer. EXAM: CT ABDOMEN AND PELVIS WITHOUT CONTRAST CT LUMBAR SPINE WITHOUT CONTRAST TECHNIQUE: Multidetector CT imaging of the abdomen and pelvis was performed following the standard protocol without IV contrast. Multiplanar reconstructed CT images of the lumbar spine submitted. COMPARISON:  None. FINDINGS: CT ABDOMEN AND PELVIS: LOWER CHEST: Lung bases are clear. The visualized heart size is normal. Mitral annular calcifications. No pericardial effusion. HEPATOBILIARY: Status post cholecystectomy. Negative non-contrast CT liver. Postprocedural enlarged common bile duct without CT findings of choledocholithiasis. PANCREAS: Nonacute. Atrophic, coarse calcifications pancreatic head. SPLEEN: Normal. ADRENALS/URINARY TRACT: Kidneys are orthotopic, Atrophic bilateral kidneys with focal LEFT upper pole scarring. Punctate nonobstructing bilateral nephrolithiasis and vascular calcifications. No hydronephrosis; limited assessment for renal masses by nonenhanced CT. The unopacified ureters are normal in course and caliber. Urinary bladder is partially distended, mild perivesicular fat stranding. Normal adrenal glands. STOMACH/BOWEL: Small  hiatal hernia. The stomach, small and large bowel are normal in course and caliber without inflammatory changes, sensitivity decreased by lack of enteric contrast. Severe sigmoid colonic diverticulosis. A few additional scattered colonic diverticula present. The appendix is not discretely identified, however there are no inflammatory changes in the right lower quadrant. VASCULAR/LYMPHATIC: Aortoiliac vessels are normal in course and caliber. Moderate to severe calcific atherosclerosis. No lymphadenopathy by CT size criteria. REPRODUCTIVE: Status post hysterectomy. OTHER: No intraperitoneal free fluid or free air. MUSCULOSKELETAL: Non-acute. Bone island RIGHT femoral head, LEFT iliac bone. CT LUMBAR SPINE: SEGMENTATION: For the purposes of this report the last well-formed intervertebral disc space is reported as L5-S1. ALIGNMENT: Straightened lumbar lordosis. No malalignment. VERTEBRAE: Vertebral bodies and posterior elements are intact. Severe disc height loss all lumbar levels with vacuum disc, endplate sclerosis and marginal spurring compatible with degenerative discs. Osteopenia. Mild symmetric sacroiliac osteoarthrosis. PARASPINAL AND OTHER SOFT TISSUES: See above. DISC LEVELS: L1-2: Small broad-based disc osteophyte complex. Mild canal stenosis. Moderate LEFT greater than RIGHT neural foraminal narrowing. L2-3: Moderate broad-based disc osteophyte complex, mild canal stenosis. Moderate RIGHT, mild-to-moderate LEFT neural foraminal narrowing. L3-4: Small broad-based disc osteophyte complex. Mild facet arthropathy. No canal stenosis. Mild-to-moderate bilateral neural foraminal narrowing. L4-5: Moderate broad-based disc osteophyte complex. No canal stenosis. Mild bilateral neural foraminal narrowing. L5-S1: No disc bulge. Minimal facet arthropathy without canal stenosis. Moderate RIGHT and moderate to severe LEFT neural foraminal narrowing. IMPRESSION: CT abdomen and pelvis: 1. Mild perivesicular fat stranding,  possible cystitis. 2. Punctate nonobstructing nephrolithiasis without obstructive uropathy. 3. Colonic diverticulosis without acute diverticulitis. CT lumbar spine: 1. No fracture or malalignment. 2. Severe degenerative change of the lumbar spine. 3. Mild canal stenosis L1-2 and L2-3. Neural foraminal narrowing all lumbar levels: Moderate to severe on the LEFT at  L5-S1. Aortic Atherosclerosis (ICD10-I70.0). Electronically Signed   By: Elon Alas M.D.   On: 08/07/2018 22:45   Ct L-spine No Charge  Result Date: 08/07/2018 CLINICAL DATA:  Low back pain and LEFT hip pain for 4 days. No injury. History of cholecystectomy, breast cancer. EXAM: CT ABDOMEN AND PELVIS WITHOUT CONTRAST CT LUMBAR SPINE WITHOUT CONTRAST TECHNIQUE: Multidetector CT imaging of the abdomen and pelvis was performed following the standard protocol without IV contrast. Multiplanar reconstructed CT images of the lumbar spine submitted. COMPARISON:  None. FINDINGS: CT ABDOMEN AND PELVIS: LOWER CHEST: Lung bases are clear. The visualized heart size is normal. Mitral annular calcifications. No pericardial effusion. HEPATOBILIARY: Status post cholecystectomy. Negative non-contrast CT liver. Postprocedural enlarged common bile duct without CT findings of choledocholithiasis. PANCREAS: Nonacute. Atrophic, coarse calcifications pancreatic head. SPLEEN: Normal. ADRENALS/URINARY TRACT: Kidneys are orthotopic, Atrophic bilateral kidneys with focal LEFT upper pole scarring. Punctate nonobstructing bilateral nephrolithiasis and vascular calcifications. No hydronephrosis; limited assessment for renal masses by nonenhanced CT. The unopacified ureters are normal in course and caliber. Urinary bladder is partially distended, mild perivesicular fat stranding. Normal adrenal glands. STOMACH/BOWEL: Small hiatal hernia. The stomach, small and large bowel are normal in course and caliber without inflammatory changes, sensitivity decreased by lack of enteric  contrast. Severe sigmoid colonic diverticulosis. A few additional scattered colonic diverticula present. The appendix is not discretely identified, however there are no inflammatory changes in the right lower quadrant. VASCULAR/LYMPHATIC: Aortoiliac vessels are normal in course and caliber. Moderate to severe calcific atherosclerosis. No lymphadenopathy by CT size criteria. REPRODUCTIVE: Status post hysterectomy. OTHER: No intraperitoneal free fluid or free air. MUSCULOSKELETAL: Non-acute. Bone island RIGHT femoral head, LEFT iliac bone. CT LUMBAR SPINE: SEGMENTATION: For the purposes of this report the last well-formed intervertebral disc space is reported as L5-S1. ALIGNMENT: Straightened lumbar lordosis. No malalignment. VERTEBRAE: Vertebral bodies and posterior elements are intact. Severe disc height loss all lumbar levels with vacuum disc, endplate sclerosis and marginal spurring compatible with degenerative discs. Osteopenia. Mild symmetric sacroiliac osteoarthrosis. PARASPINAL AND OTHER SOFT TISSUES: See above. DISC LEVELS: L1-2: Small broad-based disc osteophyte complex. Mild canal stenosis. Moderate LEFT greater than RIGHT neural foraminal narrowing. L2-3: Moderate broad-based disc osteophyte complex, mild canal stenosis. Moderate RIGHT, mild-to-moderate LEFT neural foraminal narrowing. L3-4: Small broad-based disc osteophyte complex. Mild facet arthropathy. No canal stenosis. Mild-to-moderate bilateral neural foraminal narrowing. L4-5: Moderate broad-based disc osteophyte complex. No canal stenosis. Mild bilateral neural foraminal narrowing. L5-S1: No disc bulge. Minimal facet arthropathy without canal stenosis. Moderate RIGHT and moderate to severe LEFT neural foraminal narrowing. IMPRESSION: CT abdomen and pelvis: 1. Mild perivesicular fat stranding, possible cystitis. 2. Punctate nonobstructing nephrolithiasis without obstructive uropathy. 3. Colonic diverticulosis without acute diverticulitis. CT  lumbar spine: 1. No fracture or malalignment. 2. Severe degenerative change of the lumbar spine. 3. Mild canal stenosis L1-2 and L2-3. Neural foraminal narrowing all lumbar levels: Moderate to severe on the LEFT at L5-S1. Aortic Atherosclerosis (ICD10-I70.0). Electronically Signed   By: Elon Alas M.D.   On: 08/07/2018 22:45    Procedures Procedures (including critical care time)  Medications Ordered in ED Medications  morphine 4 MG/ML injection 2 mg (2 mg Intravenous Given 08/07/18 2117)     Initial Impression / Assessment and Plan / ED Course  I have reviewed the triage vital signs and the nursing notes.  Pertinent labs & imaging results that were available during my care of the patient were reviewed by me and considered in my medical decision making (see  chart for details).     R/o diverticulitis, possible MSK given recent increase in forced activity with PT at [facility] - she has no neuro sx - no signs of DVT.  CT scan shows no acute findings in the spine, lots of arthritis, no acute intra-abdominal findings other than some possible inflammatory changes around the bladder, urine pending however there is no leukocytosis, no significant change in metabolic panel, last creatinine was 1.72, today it is 1.88 but it is been as high as 2.3 as recently as 1 month ago.  Change of shift - care signed out to Dr. Eliane Decree to follow up results of urine and disposition accordingly.  Final Clinical Impressions(s) / ED Diagnoses   Final diagnoses:  Acute left-sided low back pain without sciatica  Pain of left lower extremity    ED Discharge Orders    None       Noemi Chapel, MD 08/07/18 2303

## 2018-08-07 NOTE — Discharge Instructions (Addendum)
Use ice and heat for comfort. Take the robaxin for your sore muscle. Recheck if you get worse.

## 2018-08-10 DIAGNOSIS — R600 Localized edema: Secondary | ICD-10-CM | POA: Diagnosis not present

## 2018-08-10 IMAGING — DX DG CHEST 2V
2 series · 2 of 2 positions shown · non-contrast
Comparison: March 02, 2018

CLINICAL DATA: Bruising and swelling.  Small laceration.

EXAM:
CHEST - 2 VIEW

[chest lat]
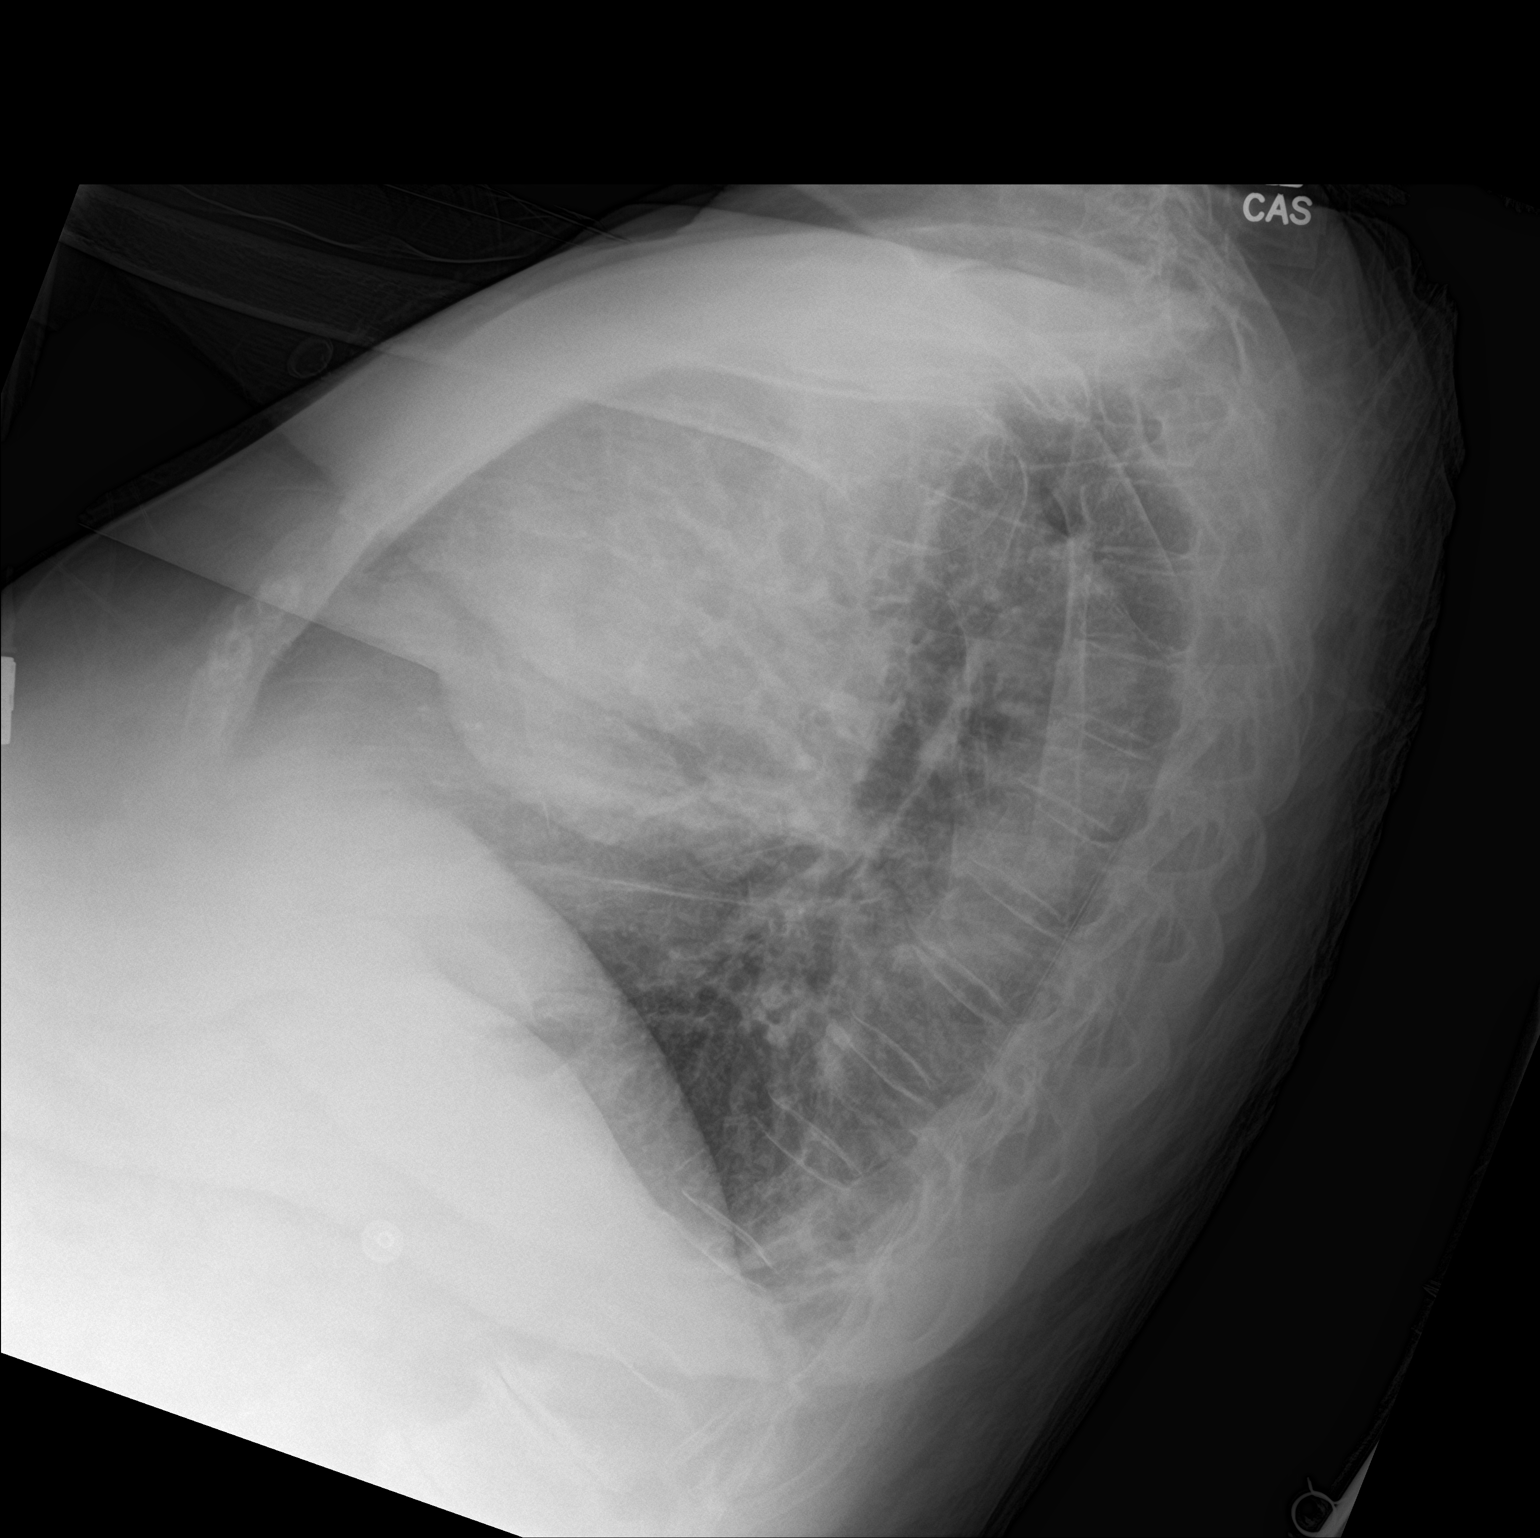

[chest ap]
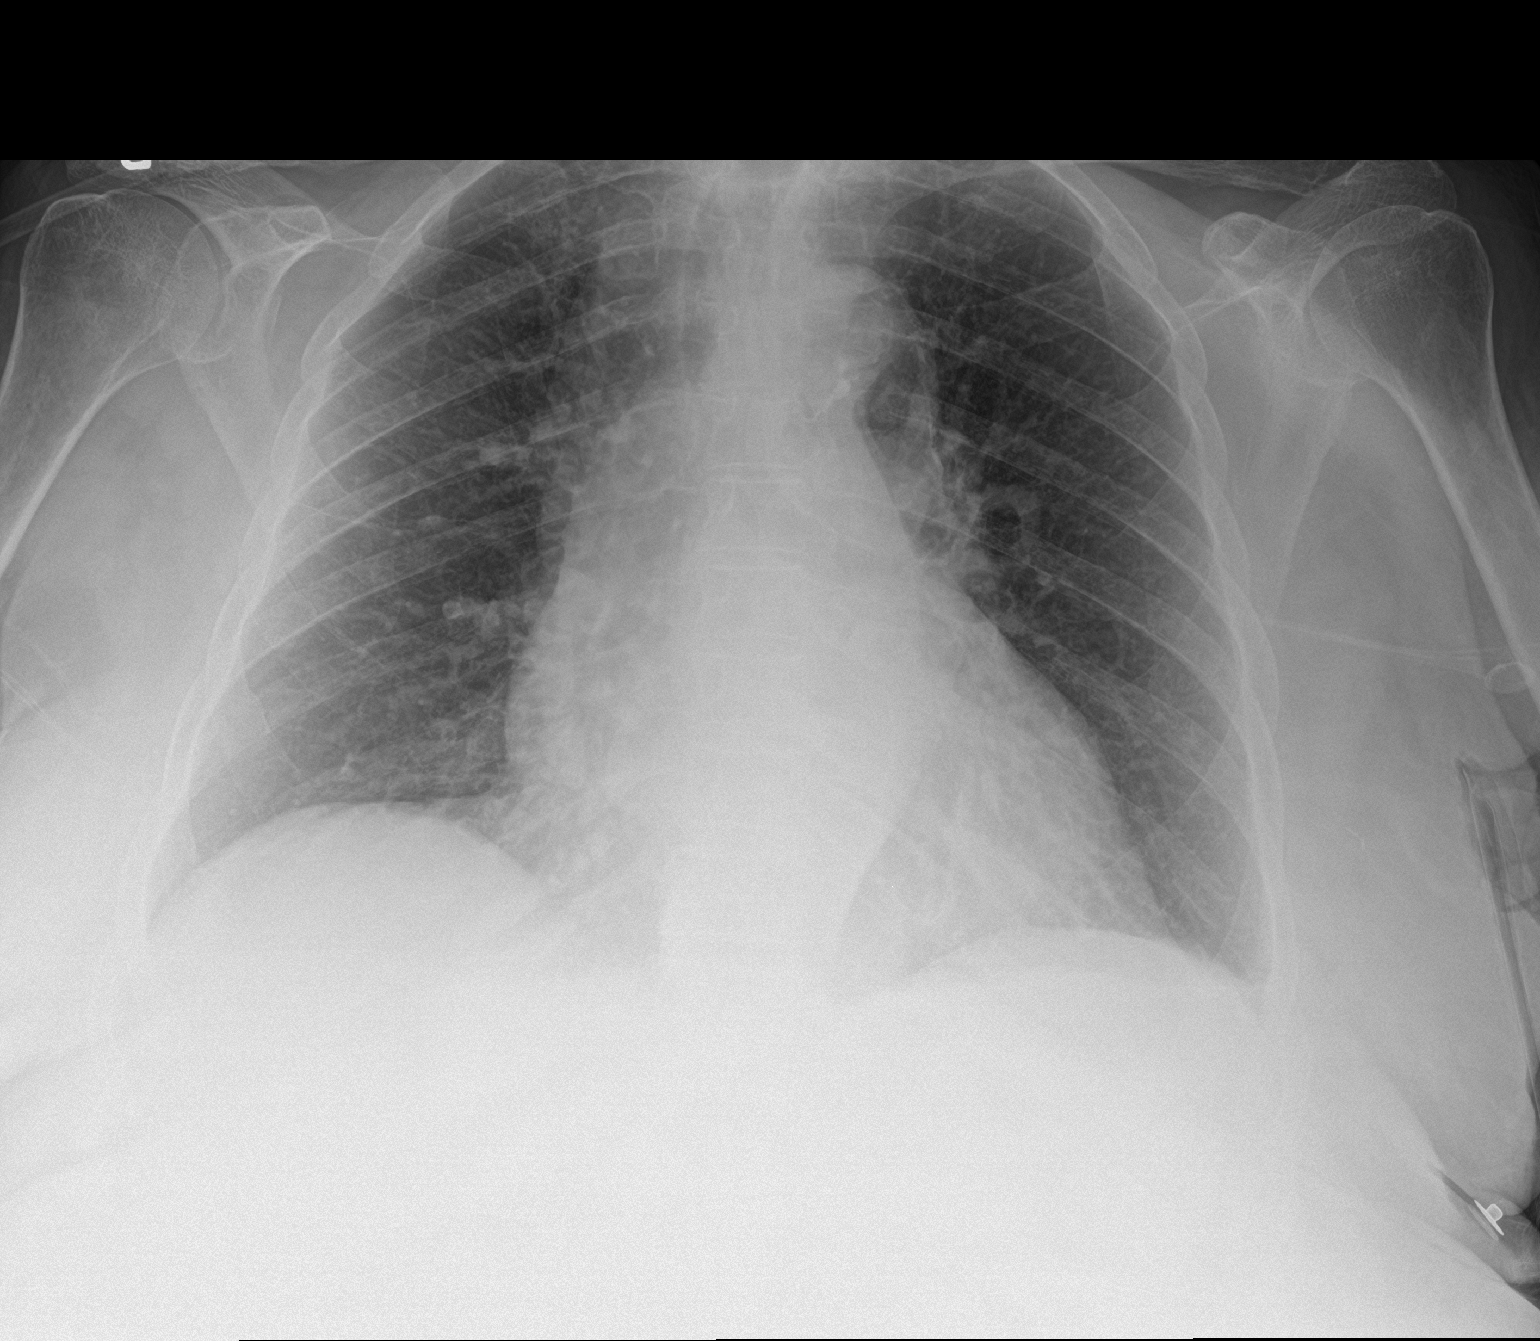

[2 of 2 positions shown; findings below may reference images not displayed]

FINDINGS: Mild cardiomegaly. The hila, mediastinum, lungs, and pleura are
otherwise unremarkable.
IMPRESSION: No active cardiopulmonary disease.

## 2018-08-10 IMAGING — CT CT CERVICAL SPINE W/O CM
4 of 7 series · 14 of 33 positions shown, 15 images · non-contrast
Comparison: January 02, 2011

CLINICAL DATA: Pain after fall.

EXAM:
CT HEAD WITHOUT CONTRAST
CT CERVICAL SPINE WITHOUT CONTRAST
TECHNIQUE: Multidetector CT imaging of the head and cervical spine was
performed following the standard protocol without intravenous
contrast. Multiplanar CT image reconstructions of the cervical spine
were also generated.

[Series 5: coronal soft tissue · coronal · 0.30mm/px · 3 of 72 slices shown]
[im 18/72  bone]
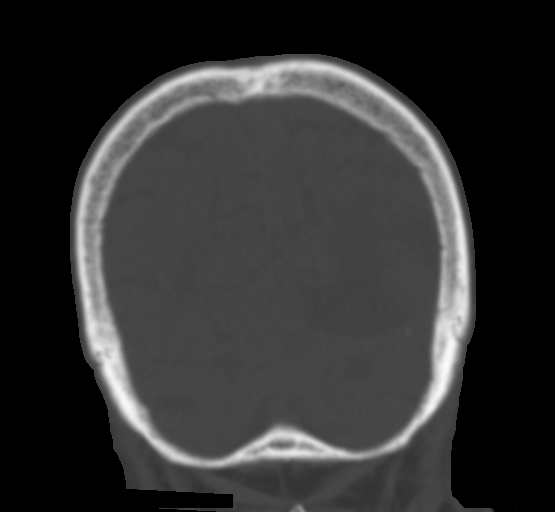
[im 36/72  bone]
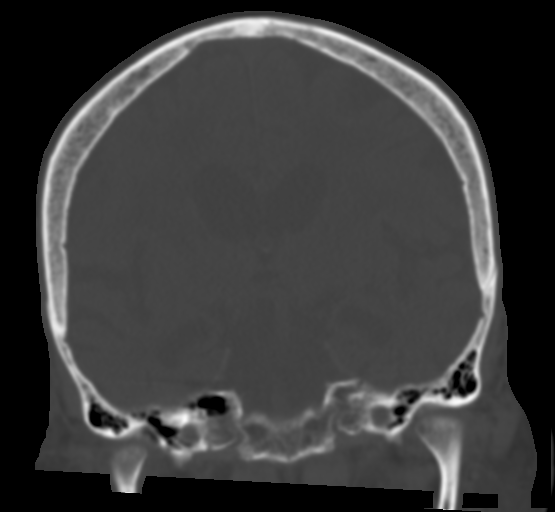
[im 54/72  bone]
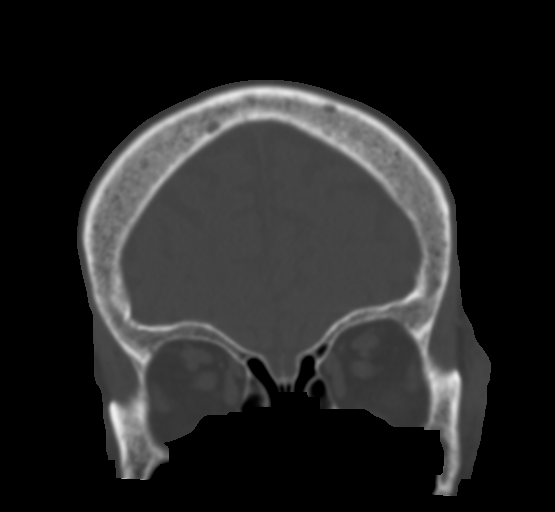

[Series 8: c spine soft · axial · 0.29mm/px · z∈[+1340,+1374]mm · 2 of 85 slices shown]
[im 17/85  soft-tissue]
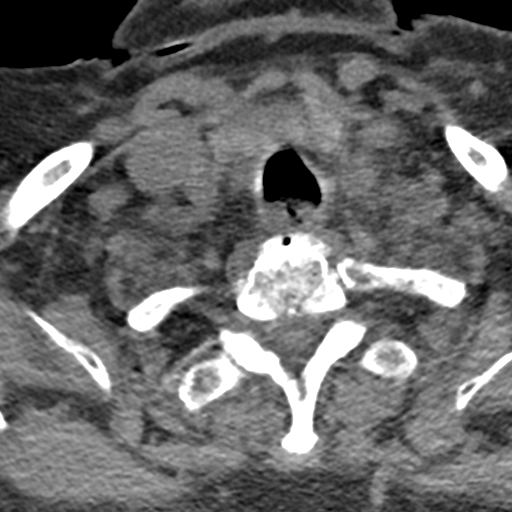
[im 34/85  soft-tissue]
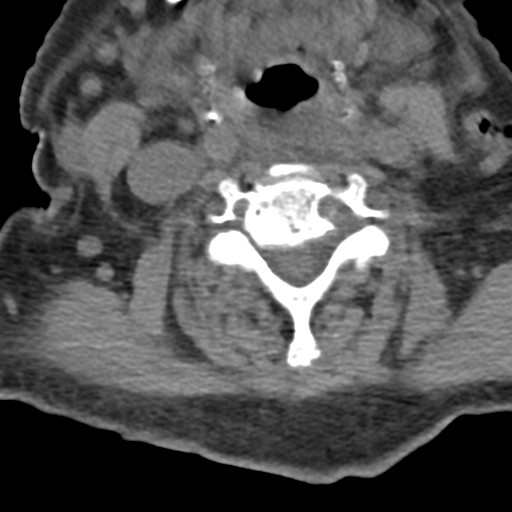

[Series 9: sagittal bone · sagittal · 0.24mm/px · 5 of 61 slices shown]
[im 11/61  bone]
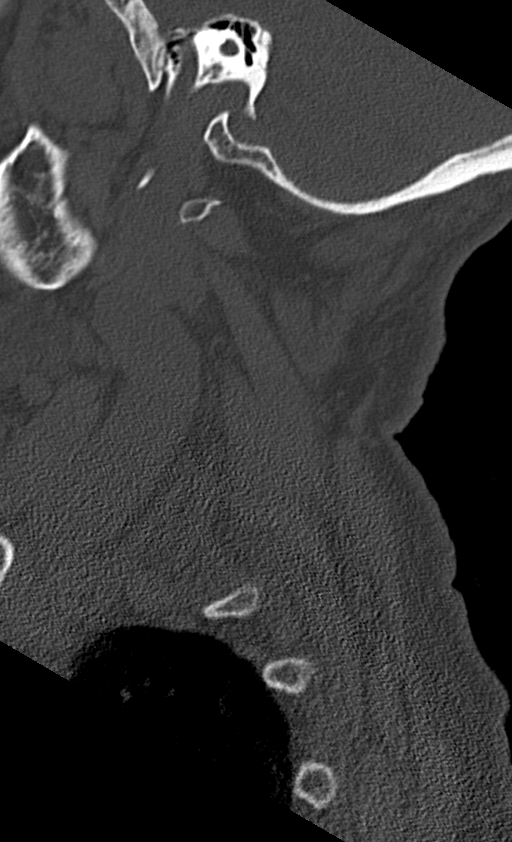
[im 21/61  bone]
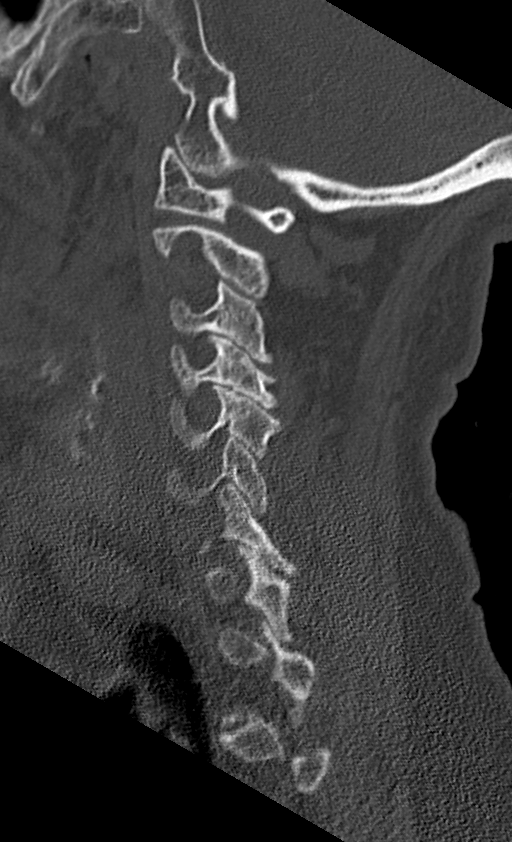
[im 31/61  bone]
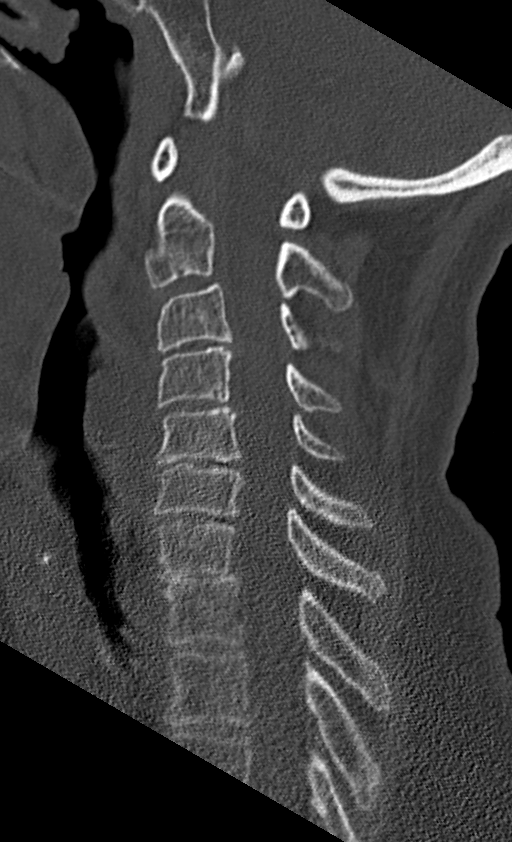
[im 41/61  bone]
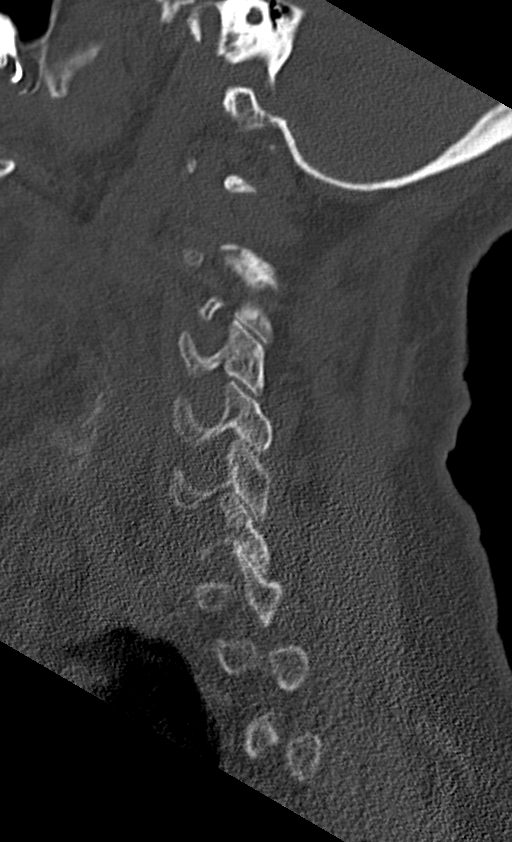
[im 51/61  bone]
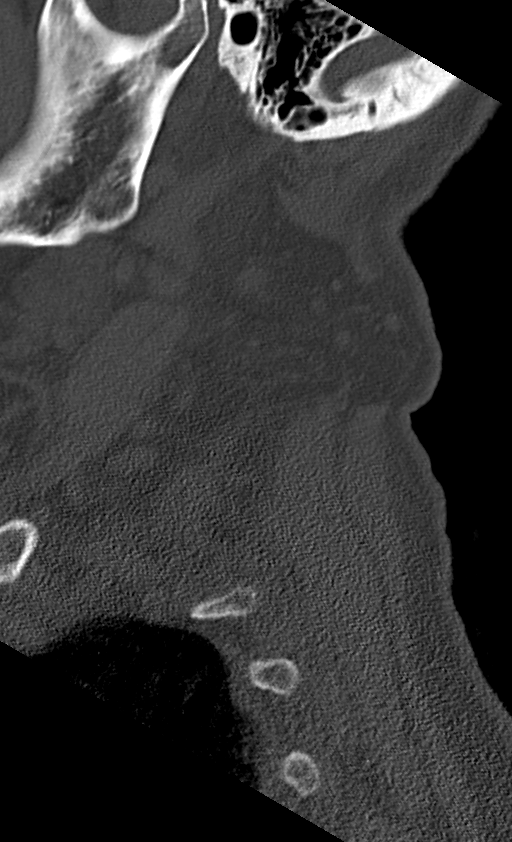

[Series 11: orthogonal bone · axial · 0.21mm/px · z∈[+1320,+1391]mm · 4 of 86 slices shown, 5 images]
[im 18/86  soft-tissue]
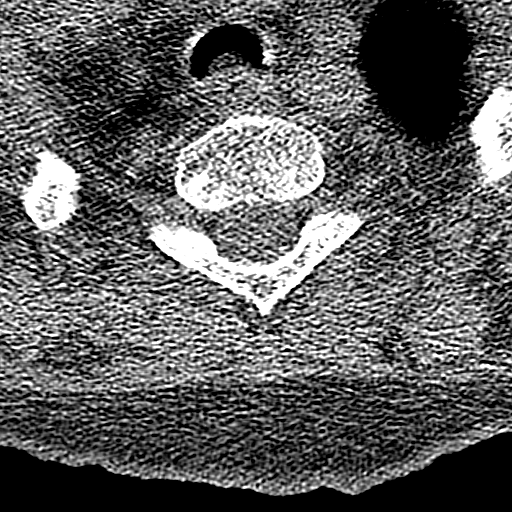
[im 18/86  bone]
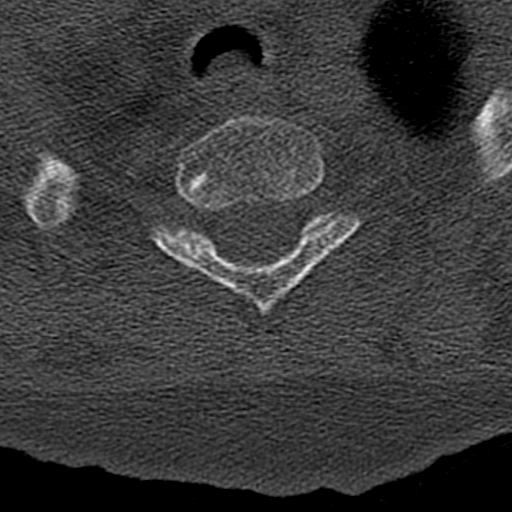
[im 35/86  bone]
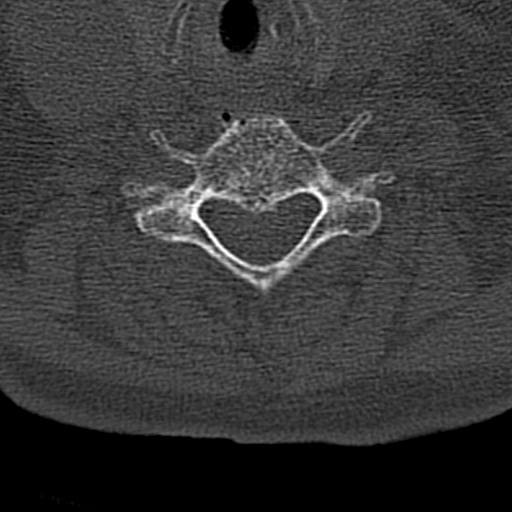
[im 52/86  bone]
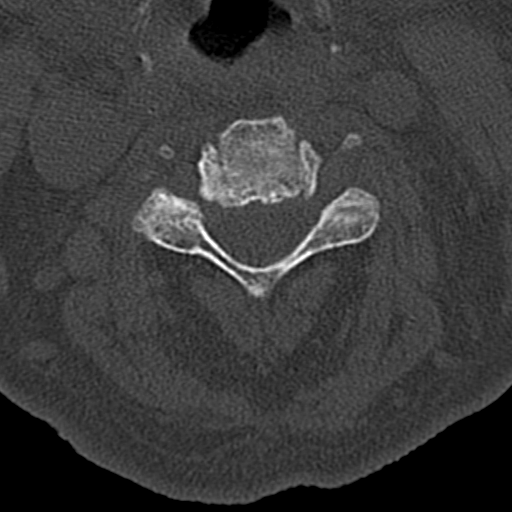
[im 69/86  bone]
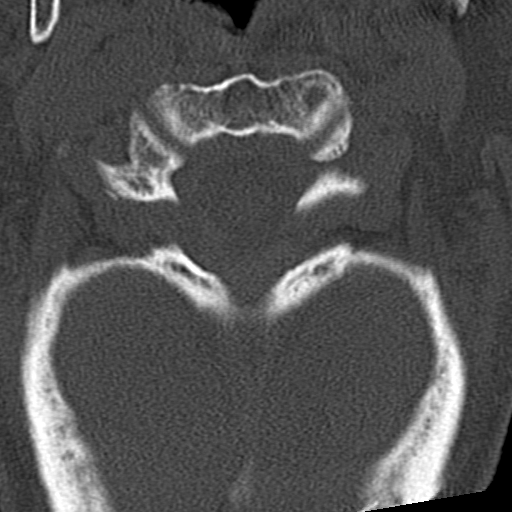

[14 of 33 positions shown; findings below may reference images not displayed]

FINDINGS: CT HEAD FINDINGS

Brain: No subdural, epidural, or subarachnoid hemorrhage. Ventricles
and sulci are stable. A left occipital/temporal infarct is stable.
Lacunar infarcts in the cerebellum are stable. The brainstem and
basal cisterns are normal. White matter changes persist. No midline
shift.

Vascular: Calcified atherosclerosis is seen in the intracranial
carotids.

Skull: Normal. Negative for fracture or focal lesion.

Sinuses/Orbits: No acute finding.

Other: Soft tissue swelling over the left forehead and periorbital
region. The underlying globe is intact. Extracranial soft tissues
are otherwise normal.

CT CERVICAL SPINE FINDINGS

Alignment: There is minimal anterolisthesis of C2 versus C3
measuring approximately 2 mm. No other malalignment. No fractures
are seen. The pre odontoid space is normal.

Skull base and vertebrae: No acute fracture. No primary bone lesion
or focal pathologic process.

Soft tissues and spinal canal: No prevertebral fluid or swelling. No
visible canal hematoma.

Disc levels: Multilevel degenerative disc disease with small
anterior and posterior osteophytes. Facet degenerative changes.

Upper chest: Negative.

Other: No other abnormalities.
IMPRESSION: 1. No acute intracranial abnormality. Chronic left
occipital/temporal infarct, white matter changes, and cerebellar
lacunar infarcts.
2. No fracture. No traumatic malalignment. Minimal anterolisthesis
of C2 versus C3 is likely due to facet degenerative changes at this
level. No fractures identified at this level.

## 2018-08-16 DIAGNOSIS — R531 Weakness: Secondary | ICD-10-CM | POA: Diagnosis not present

## 2018-08-16 DIAGNOSIS — R609 Edema, unspecified: Secondary | ICD-10-CM | POA: Diagnosis not present

## 2018-08-16 DIAGNOSIS — I509 Heart failure, unspecified: Secondary | ICD-10-CM | POA: Diagnosis not present

## 2018-08-16 DIAGNOSIS — Z9181 History of falling: Secondary | ICD-10-CM | POA: Diagnosis not present

## 2018-08-24 DIAGNOSIS — I10 Essential (primary) hypertension: Secondary | ICD-10-CM | POA: Diagnosis not present

## 2018-08-24 DIAGNOSIS — E559 Vitamin D deficiency, unspecified: Secondary | ICD-10-CM | POA: Diagnosis not present

## 2018-08-24 DIAGNOSIS — M5432 Sciatica, left side: Secondary | ICD-10-CM | POA: Diagnosis not present

## 2018-08-24 DIAGNOSIS — M170 Bilateral primary osteoarthritis of knee: Secondary | ICD-10-CM | POA: Diagnosis not present

## 2018-08-28 ENCOUNTER — Other Ambulatory Visit: Payer: Self-pay | Admitting: Family Medicine

## 2018-08-28 ENCOUNTER — Other Ambulatory Visit (HOSPITAL_COMMUNITY): Payer: Self-pay | Admitting: Family Medicine

## 2018-08-28 DIAGNOSIS — M5432 Sciatica, left side: Secondary | ICD-10-CM

## 2018-09-04 ENCOUNTER — Other Ambulatory Visit (HOSPITAL_COMMUNITY): Payer: Self-pay | Admitting: Family Medicine

## 2018-09-04 ENCOUNTER — Ambulatory Visit (HOSPITAL_COMMUNITY)
Admission: RE | Admit: 2018-09-04 | Discharge: 2018-09-04 | Disposition: A | Discharge status: Home / Self Care | Payer: PPO | Source: Ambulatory Visit | Attending: Family Medicine | Admitting: Family Medicine

## 2018-09-04 DIAGNOSIS — M25561 Pain in right knee: Secondary | ICD-10-CM

## 2018-09-04 DIAGNOSIS — M5432 Sciatica, left side: Secondary | ICD-10-CM

## 2018-09-04 DIAGNOSIS — M5126 Other intervertebral disc displacement, lumbar region: Secondary | ICD-10-CM | POA: Insufficient documentation

## 2018-09-04 DIAGNOSIS — M25461 Effusion, right knee: Secondary | ICD-10-CM | POA: Diagnosis not present

## 2018-09-04 DIAGNOSIS — M4807 Spinal stenosis, lumbosacral region: Secondary | ICD-10-CM | POA: Diagnosis not present

## 2018-09-04 DIAGNOSIS — M5136 Other intervertebral disc degeneration, lumbar region: Secondary | ICD-10-CM | POA: Diagnosis not present

## 2018-09-04 DIAGNOSIS — M48061 Spinal stenosis, lumbar region without neurogenic claudication: Secondary | ICD-10-CM | POA: Diagnosis not present

## 2018-09-04 DIAGNOSIS — M5117 Intervertebral disc disorders with radiculopathy, lumbosacral region: Secondary | ICD-10-CM | POA: Diagnosis not present

## 2018-09-04 DIAGNOSIS — M1712 Unilateral primary osteoarthritis, left knee: Secondary | ICD-10-CM | POA: Diagnosis not present

## 2018-09-04 DIAGNOSIS — I700 Atherosclerosis of aorta: Secondary | ICD-10-CM | POA: Insufficient documentation

## 2018-09-04 DIAGNOSIS — M25562 Pain in left knee: Secondary | ICD-10-CM

## 2018-09-06 DIAGNOSIS — B351 Tinea unguium: Secondary | ICD-10-CM | POA: Diagnosis not present

## 2018-09-06 DIAGNOSIS — M79675 Pain in left toe(s): Secondary | ICD-10-CM | POA: Diagnosis not present

## 2018-09-12 ENCOUNTER — Encounter: Payer: Self-pay | Admitting: *Deleted

## 2018-09-13 ENCOUNTER — Encounter: Payer: Self-pay | Admitting: Neurology

## 2018-09-13 ENCOUNTER — Ambulatory Visit (INDEPENDENT_AMBULATORY_CARE_PROVIDER_SITE_OTHER): Payer: PPO | Admitting: Neurology

## 2018-09-13 VITALS — Ht 62.0 in | Wt 164.0 lb

## 2018-09-13 DIAGNOSIS — G3184 Mild cognitive impairment, so stated: Secondary | ICD-10-CM

## 2018-09-13 DIAGNOSIS — R269 Unspecified abnormalities of gait and mobility: Secondary | ICD-10-CM

## 2018-09-13 NOTE — Progress Notes (Signed)
PATIENT: Nancy Medina DOB: 03-Mar-1925  Chief Complaint  Patient presents with  . Worsening mental status    MMSE 29/30 - 6 animals.  She is here with her daughter, Nancy Medina, to have her memory loss further evaluated.  Her daughter feels she has good and bad days.  The patient resides in [facility] in [place].  Marland Kitchen PCP    Lemmie Evens, MD     HISTORICAL  Nancy Medina is a 84 years old female, accompanied by her daughter Nancy Medina, seen in request by her primary care physician Dr. Lemmie Evens, for evaluation of intermittent confusion, initial evaluation was September 13, 2018.  I have reviewed and summarized the referring note from the referring physician.  She had a past medical history of hypertension, diabetes, she retired from Psychologist, educational job, enjoying playing cards, traveling the past, currently she lives in [place], ambulate with a walker, she reported good appetite, sleeps well, she had worsening unsteady gait, fell few times, now she has a Actuary with her 24/7 since September, she only has mild memory trouble, tends to repeat herself, but overall doing well,  In spring 2019, she suffered flu, during that period of time, she was noted to have increased confusion, has much improved, today's Mini-Mental status examination is 60 out of 30, she complains of a lot of family related stress recently.  I personally reviewed MRI lumbar in October 2019, multilevel degenerative disc disease, caudal disc extension into the right lateral recess at L4-5, with 10 mm sequestered disc fragment affecting the course of the descending right L5 nerve roots, moderate spinal stenosis at L2-3, MRI of brain in August 2019, stable generalized atrophy, supratentorium small vessel disease, chronic encephalomalacia involving the posterior inferior left parietal and occipital lobe, remote lacunar infarction involving bilateral basal ganglia and cerebellum.  There was no acute  abnormality.  Laboratory evaluations in 2019, CBC showed hemoglobin of 10.5, BMP showed creatinine of 1.43, GFR of 31, ferritin 23,  REVIEW OF SYSTEMS: Full 14 system review of systems performed and notable only for feeling cold, confusion, weakness All other review of systems were negative.  ALLERGIES: Allergies  Allergen Reactions  . Metformin     REACTION: Felt poor    HOME MEDICATIONS: Current Outpatient Medications  Medication Sig Dispense Refill  . acetaminophen (TYLENOL) 500 MG tablet Take 500 mg by mouth every 6 (six) hours as needed for mild pain.    Marland Kitchen amLODipine (NORVASC) 10 MG tablet Take 1 tablet (10 mg total) by mouth daily. 30 tablet 0  . aspirin 81 MG tablet Take 81 mg by mouth daily.      . Cholecalciferol (VITAMIN D) 2000 units CAPS Take 1 capsule by mouth daily.    . Cholecalciferol (VITAMIN D-1000 MAX ST) 1000 UNITS tablet Take 5,000 Units by mouth daily.     . clonazePAM (KLONOPIN) 1 MG tablet Take 1 mg by mouth at bedtime. *May take one tablet every 6 hours as needed for anxiety up to three times daily    . docusate sodium (COLACE) 100 MG capsule Take 100 mg by mouth 2 (two) times daily as needed for mild constipation.    Marland Kitchen ezetimibe (ZETIA) 10 MG tablet Take 1 tablet by mouth daily.    . fish oil-omega-3 fatty acids 1000 MG capsule Take 1 g by mouth daily.     . furosemide (LASIX) 20 MG tablet Take 1 tablet by mouth daily.    Marland Kitchen glimepiride (AMARYL) 1 MG tablet Take  1 tablet by mouth daily.    Marland Kitchen latanoprost (XALATAN) 0.005 % ophthalmic solution Place 1 drop into both eyes daily.     Marland Kitchen loperamide (IMODIUM A-D) 2 MG tablet Take 2 mg by mouth 4 (four) times daily as needed for diarrhea or loose stools.    . meclizine (ANTIVERT) 25 MG tablet Take 25 mg by mouth 4 (four) times daily as needed for dizziness.    . methocarbamol (ROBAXIN) 500 MG tablet Take 1 tablet (500 mg total) by mouth every 8 (eight) hours as needed (muscle soreness). 20 tablet 0  . omeprazole  (PRILOSEC) 40 MG capsule Take 1 capsule by mouth daily.     Marland Kitchen PROAIR HFA 108 (90 Base) MCG/ACT inhaler Take 1-2 puffs by mouth every 6 (six) hours as needed.    . sitaGLIPtin (JANUVIA) 25 MG tablet Take 1 tablet (25 mg total) by mouth daily. 30 tablet 1  . timolol (TIMOPTIC) 0.5 % ophthalmic solution Place 1 drop into both eyes every 12 (twelve) hours.     . vitamin B-12 (CYANOCOBALAMIN) 1000 MCG tablet Take 1 tablet (1,000 mcg total) by mouth daily. 30 tablet 0   No current facility-administered medications for this visit.     PAST MEDICAL HISTORY: Past Medical History:  Diagnosis Date  . Achilles tendinitis   . Allergic rhinitis   . Anemia of chronic disease    at least back to 2010  . Anxiety   . Benign paroxysmal positional vertigo   . Cataract   . Cerebrovascular accident ([place])   . Chronic kidney disease (CKD), stage III (moderate) (HCC)   . Depression   . DJD (degenerative joint disease)   . DM (diabetes mellitus) ([place])    type II  . GERD (gastroesophageal reflux disease)   . HTN (hypertension)   . HX: breast cancer   . Hyperlipidemia   . Hypertonicity of bladder   . Knee pain, left   . Leg pain   . Lymphocytic colitis 2008   treated with Lialda short-term  . Memory loss   . Obesity   . Osteoporosis, unspecified   . Renal disease    stage III  . Unspecified fall   . Unspecified glaucoma(365.9)     PAST SURGICAL HISTORY: Past Surgical History:  Procedure Laterality Date  . Bladder tack  2000  . BREAST LUMPECTOMY  2004   right  . cataracts    . CHOLECYSTECTOMY  1960  . COLONOSCOPY  07/2007   lymphocytic colitis  . ESOPHAGOGASTRODUODENOSCOPY  06/24/2011   Procedure: ESOPHAGOGASTRODUODENOSCOPY (EGD);  Surgeon: Dorothyann Peng, MD;  [facility];  Service: Endoscopy;  Laterality: N/A;.  Peptic stricture, mild erosive reflux esophagitis, hiatal  . FLEXIBLE SIGMOIDOSCOPY  11/25/2011   MODERATE DIVERTICULOSIS/INTERNAL HEMORRHOIDS  . S/P Hysterectomy  2000    benign reasons  . SAVORY DILATION  06/24/2011   Procedure: SAVORY DILATION;  Surgeon: Dorothyann Peng, MD;  Location: [facility];  Service: Endoscopy;  Laterality: N/A;    FAMILY HISTORY: Family History  Problem Relation Age of Onset  . Arthritis Mother        died at age 26  . Other Father        died at age 30 - horse accident  . Colon cancer Neg Hx           . Liver disease Neg Hx           . GI problems Neg Hx  SOCIAL HISTORY: Social History   Socioeconomic History  . Marital status: Widowed    Spouse name: Not on file  . Number of children: 2  . Years of education: 33  . Highest education level: High school graduate  Occupational History  . Occupation: retired    Fish farm manager: RETIRED    Comment: [employer]  . Financial resource strain: Not on file  . Food insecurity:    Worry: Not on file    Inability: Not on file  . Transportation needs:    Medical: Not on file    Non-medical: Not on file  Tobacco Use  . Smoking status: Former Smoker    Packs/day: 1.00    Types: Cigarettes  . Smokeless tobacco: Never Used  . Tobacco comment: 30 yrs ago  Substance and Sexual Activity  . Alcohol use: No  . Drug use: No  . Sexual activity: Not on file  Lifestyle  . Physical activity:    Days per week: Not on file    Minutes per session: Not on file  . Stress: Not on file  Relationships  . Social connections:    Talks on phone: Not on file    Gets together: Not on file    Attends religious service: Not on file    Active member of club or organization: Not on file    Attends meetings of clubs or organizations: Not on file    Relationship status: Not on file  . Intimate partner violence:    Fear of current or ex partner: Not on file    Emotionally abused: Not on file    Physically abused: Not on file    Forced sexual activity: Not on file  Other Topics Concern  . Not on file  Social History Narrative   Lives at [facility] in [place].   Right-handed.   2-3 cups caffeine per day.     PHYSICAL EXAM   Vitals:   09/13/18 0814  Weight: 164 lb (74.4 kg)  Height: 5\' 2"  (1.575 m)    Not recorded      Body mass index is 30 kg/m.  PHYSICAL EXAMNIATION:  Gen: NAD, conversant, well nourised, obese, well groomed                     Cardiovascular: Regular rate rhythm, no peripheral edema, warm, nontender. Eyes: Conjunctivae clear without exudates or hemorrhage Neck: Supple, no carotid bruits. Pulmonary: Clear to auscultation bilaterally   NEUROLOGICAL EXAM:  MENTAL STATUS: MMSE - Mini Mental State Exam 09/13/2018  Orientation to time 5  Orientation to Place 5  Registration 3  Attention/ Calculation 5  Recall 2  Language- name 2 objects 2  Language- repeat 1  Language- follow 3 step command 3  Language- read & follow direction 1  Write a sentence 1  Copy design 1  Total score 29  Animal naming 6   CRANIAL NERVES: CN II: Visual fields are full to confrontation.  Pupils are round equal and briskly reactive to light. CN III, IV, VI: extraocular movement are normal. No ptosis. CN V: Facial sensation is intact to pinprick in all 3 divisions bilaterally. Corneal responses are intact.  CN VII: Face is symmetric with normal eye closure and smile. CN VIII: Hearing is normal to rubbing fingers CN IX, X: Palate elevates symmetrically. Phonation is normal. CN XI: Head turning and shoulder shrug are intact CN XII: Tongue is midline with normal movements and  no atrophy.  MOTOR: There is no pronator drift of out-stretched arms. Muscle bulk and tone are normal. Muscle strength is normal.  REFLEXES: Reflexes are hypoactive and symmetric at the biceps, triceps, knees, and ankles. Plantar responses are flexor.  SENSORY: Intact to light touch, pinprick,  COORDINATION: Rapid alternating movements and fine finger movements are intact. There is no dysmetria on finger-to-nose and  heel-knee-shin.    GAIT/STANCE: She needs pushed up to get up from seated position, antalgic, unsteady Romberg is absent.   DIAGNOSTIC DATA (LABS, IMAGING, TESTING) - I reviewed patient records, labs, notes, testing and imaging myself where available.   ASSESSMENT AND PLAN  Nancy Medina is a 84 y.o. female   Mild cognitive impairment  Mini-Mental Status Examination 29 out of 30 today,  Evidence of previous stroke by MRI brain  She denies significant memory loss now, 8 happened in the setting of family stress, vascular component  Continue moderate exercise  Aspirin 81 mg daily  I have discussed with patient and her family, she does not want to try Aricept, Namenda at this point   Marcial Pacas, M.D. Ph.D.  [facility] [address] [place],  [address] Ph: [phone] Fax: [phone]  CC: Lemmie Evens, MD

## 2018-09-15 DIAGNOSIS — I509 Heart failure, unspecified: Secondary | ICD-10-CM | POA: Diagnosis not present

## 2018-09-15 DIAGNOSIS — R609 Edema, unspecified: Secondary | ICD-10-CM | POA: Diagnosis not present

## 2018-09-15 DIAGNOSIS — Z9181 History of falling: Secondary | ICD-10-CM | POA: Diagnosis not present

## 2018-09-15 DIAGNOSIS — R531 Weakness: Secondary | ICD-10-CM | POA: Diagnosis not present

## 2018-09-19 DIAGNOSIS — H5213 Myopia, bilateral: Secondary | ICD-10-CM | POA: Diagnosis not present

## 2018-09-19 DIAGNOSIS — H401134 Primary open-angle glaucoma, bilateral, indeterminate stage: Secondary | ICD-10-CM | POA: Diagnosis not present

## 2018-09-19 DIAGNOSIS — H43813 Vitreous degeneration, bilateral: Secondary | ICD-10-CM | POA: Diagnosis not present

## 2018-09-19 DIAGNOSIS — E113291 Type 2 diabetes mellitus with mild nonproliferative diabetic retinopathy without macular edema, right eye: Secondary | ICD-10-CM | POA: Diagnosis not present

## 2018-09-26 DIAGNOSIS — Z9181 History of falling: Secondary | ICD-10-CM | POA: Diagnosis not present

## 2018-09-26 DIAGNOSIS — H6123 Impacted cerumen, bilateral: Secondary | ICD-10-CM | POA: Diagnosis not present

## 2018-09-26 DIAGNOSIS — M545 Low back pain: Secondary | ICD-10-CM | POA: Diagnosis not present

## 2018-09-26 DIAGNOSIS — M48061 Spinal stenosis, lumbar region without neurogenic claudication: Secondary | ICD-10-CM | POA: Diagnosis not present

## 2018-10-10 DIAGNOSIS — Z008 Encounter for other general examination: Secondary | ICD-10-CM | POA: Diagnosis not present

## 2018-10-16 DIAGNOSIS — R531 Weakness: Secondary | ICD-10-CM | POA: Diagnosis not present

## 2018-10-16 DIAGNOSIS — I509 Heart failure, unspecified: Secondary | ICD-10-CM | POA: Diagnosis not present

## 2018-10-16 DIAGNOSIS — Z9181 History of falling: Secondary | ICD-10-CM | POA: Diagnosis not present

## 2018-10-16 DIAGNOSIS — R609 Edema, unspecified: Secondary | ICD-10-CM | POA: Diagnosis not present

## 2018-11-15 DIAGNOSIS — R609 Edema, unspecified: Secondary | ICD-10-CM | POA: Diagnosis not present

## 2018-11-15 DIAGNOSIS — R531 Weakness: Secondary | ICD-10-CM | POA: Diagnosis not present

## 2018-11-15 DIAGNOSIS — Z9181 History of falling: Secondary | ICD-10-CM | POA: Diagnosis not present

## 2018-11-15 DIAGNOSIS — I509 Heart failure, unspecified: Secondary | ICD-10-CM | POA: Diagnosis not present

## 2018-11-29 DIAGNOSIS — B351 Tinea unguium: Secondary | ICD-10-CM | POA: Diagnosis not present

## 2018-11-29 DIAGNOSIS — M79674 Pain in right toe(s): Secondary | ICD-10-CM | POA: Diagnosis not present

## 2018-12-16 DIAGNOSIS — I509 Heart failure, unspecified: Secondary | ICD-10-CM | POA: Diagnosis not present

## 2018-12-16 DIAGNOSIS — R609 Edema, unspecified: Secondary | ICD-10-CM | POA: Diagnosis not present

## 2018-12-16 DIAGNOSIS — Z9181 History of falling: Secondary | ICD-10-CM | POA: Diagnosis not present

## 2018-12-16 DIAGNOSIS — R531 Weakness: Secondary | ICD-10-CM | POA: Diagnosis not present

## 2018-12-26 DIAGNOSIS — M25562 Pain in left knee: Secondary | ICD-10-CM | POA: Diagnosis not present

## 2018-12-26 DIAGNOSIS — Z9181 History of falling: Secondary | ICD-10-CM | POA: Diagnosis not present

## 2018-12-26 DIAGNOSIS — G4700 Insomnia, unspecified: Secondary | ICD-10-CM | POA: Diagnosis not present

## 2018-12-26 DIAGNOSIS — M25561 Pain in right knee: Secondary | ICD-10-CM | POA: Diagnosis not present

## 2019-01-16 DIAGNOSIS — R531 Weakness: Secondary | ICD-10-CM | POA: Diagnosis not present

## 2019-01-16 DIAGNOSIS — Z9181 History of falling: Secondary | ICD-10-CM | POA: Diagnosis not present

## 2019-01-16 DIAGNOSIS — I509 Heart failure, unspecified: Secondary | ICD-10-CM | POA: Diagnosis not present

## 2019-01-16 DIAGNOSIS — R609 Edema, unspecified: Secondary | ICD-10-CM | POA: Diagnosis not present

## 2019-01-28 ENCOUNTER — Encounter (HOSPITAL_COMMUNITY): Payer: Self-pay | Admitting: Emergency Medicine

## 2019-01-28 ENCOUNTER — Emergency Department (HOSPITAL_COMMUNITY): Payer: PPO

## 2019-01-28 ENCOUNTER — Emergency Department (HOSPITAL_COMMUNITY)
Admission: EM | Admit: 2019-01-28 | Discharge: 2019-01-29 | Disposition: A | Discharge status: Home / Self Care | Payer: PPO | Attending: Emergency Medicine | Admitting: Emergency Medicine

## 2019-01-28 ENCOUNTER — Other Ambulatory Visit: Payer: Self-pay

## 2019-01-28 DIAGNOSIS — Z7982 Long term (current) use of aspirin: Secondary | ICD-10-CM | POA: Diagnosis not present

## 2019-01-28 DIAGNOSIS — Z79899 Other long term (current) drug therapy: Secondary | ICD-10-CM | POA: Diagnosis not present

## 2019-01-28 DIAGNOSIS — G8929 Other chronic pain: Secondary | ICD-10-CM | POA: Insufficient documentation

## 2019-01-28 DIAGNOSIS — N183 Chronic kidney disease, stage 3 (moderate): Secondary | ICD-10-CM | POA: Diagnosis not present

## 2019-01-28 DIAGNOSIS — M1990 Unspecified osteoarthritis, unspecified site: Secondary | ICD-10-CM | POA: Diagnosis not present

## 2019-01-28 DIAGNOSIS — E1122 Type 2 diabetes mellitus with diabetic chronic kidney disease: Secondary | ICD-10-CM | POA: Insufficient documentation

## 2019-01-28 DIAGNOSIS — M25461 Effusion, right knee: Secondary | ICD-10-CM | POA: Insufficient documentation

## 2019-01-28 DIAGNOSIS — Z853 Personal history of malignant neoplasm of breast: Secondary | ICD-10-CM | POA: Diagnosis not present

## 2019-01-28 DIAGNOSIS — M25562 Pain in left knee: Secondary | ICD-10-CM | POA: Diagnosis not present

## 2019-01-28 DIAGNOSIS — Z87891 Personal history of nicotine dependence: Secondary | ICD-10-CM | POA: Diagnosis not present

## 2019-01-28 DIAGNOSIS — M79605 Pain in left leg: Secondary | ICD-10-CM | POA: Diagnosis not present

## 2019-01-28 DIAGNOSIS — M25561 Pain in right knee: Secondary | ICD-10-CM | POA: Diagnosis not present

## 2019-01-28 DIAGNOSIS — Y92009 Unspecified place in unspecified non-institutional (private) residence as the place of occurrence of the external cause: Secondary | ICD-10-CM | POA: Diagnosis not present

## 2019-01-28 DIAGNOSIS — M79604 Pain in right leg: Secondary | ICD-10-CM | POA: Diagnosis present

## 2019-01-28 DIAGNOSIS — I129 Hypertensive chronic kidney disease with stage 1 through stage 4 chronic kidney disease, or unspecified chronic kidney disease: Secondary | ICD-10-CM | POA: Diagnosis not present

## 2019-01-28 LAB — CBC
HCT: 35 % — ABNORMAL LOW (ref 36.0–46.0)
HEMOGLOBIN: 10.6 g/dL — AB (ref 12.0–15.0)
MCH: 27.6 pg (ref 26.0–34.0)
MCHC: 30.3 g/dL (ref 30.0–36.0)
MCV: 91.1 fL (ref 80.0–100.0)
PLATELETS: 235 10*3/uL (ref 150–400)
RBC: 3.84 MIL/uL — AB (ref 3.87–5.11)
RDW: 13.9 % (ref 11.5–15.5)
WBC: 6.4 10*3/uL (ref 4.0–10.5)
nRBC: 0 % (ref 0.0–0.2)

## 2019-01-28 LAB — COMPREHENSIVE METABOLIC PANEL
ALBUMIN: 3.8 g/dL (ref 3.5–5.0)
ALT: 11 U/L (ref 0–44)
ANION GAP: 11 (ref 5–15)
AST: 15 U/L (ref 15–41)
Alkaline Phosphatase: 63 U/L (ref 38–126)
BILIRUBIN TOTAL: 0.1 mg/dL — AB (ref 0.3–1.2)
BUN: 40 mg/dL — AB (ref 8–23)
CALCIUM: 9 mg/dL (ref 8.9–10.3)
CO2: 24 mmol/L (ref 22–32)
Chloride: 104 mmol/L (ref 98–111)
Creatinine, Ser: 2.01 mg/dL — ABNORMAL HIGH (ref 0.44–1.00)
GFR calc Af Amer: 25 mL/min — ABNORMAL LOW (ref >60)
GFR calc non Af Amer: 21 mL/min — ABNORMAL LOW (ref >60)
Glucose, Bld: 107 mg/dL — ABNORMAL HIGH (ref 70–99)
POTASSIUM: 3.7 mmol/L (ref 3.5–5.1)
SODIUM: 139 mmol/L (ref 135–145)
TOTAL PROTEIN: 6.9 g/dL (ref 6.5–8.1)

## 2019-01-28 MED ORDER — LIDOCAINE HCL (PF) 1 % IJ SOLN
INTRAMUSCULAR | Status: AC
  Filled 2019-01-28: qty 6

## 2019-01-28 MED ORDER — HYDROCODONE-ACETAMINOPHEN 5-325 MG PO TABS: 1.0000 | ORAL_TABLET | Freq: Three times a day (TID) | ORAL | 0 refills | Status: DC | PRN

## 2019-01-28 MED ORDER — HYDROCODONE-ACETAMINOPHEN 5-325 MG PO TABS
1.0000 | ORAL_TABLET | Freq: Once | ORAL | Status: AC
  Administered 2019-01-28: 1 via ORAL
  Filled 2019-01-28: qty 1

## 2019-01-28 NOTE — ED Provider Notes (Signed)
[facility] EMERGENCY DEPARTMENT Provider Note   CSN: [id number] Arrival date & time: 01/28/19  1601    History   Chief Complaint Chief Complaint  Patient presents with  . Leg Pain    HPI Nancy Medina is a 85 y.o. female.     Pt presents to the ED complaining of bilateral leg pain x "months." Reports she fell either in August or April and had to crawl a significant way on her knees to yell for help at [facility]. She came to the ED at that time but family cannot tell me what was done for patient. She was discharged home and has been following up with PCP regarding pain since. Pt has an appointment scheduled with an orthopedist next week but came in today due to worsening pain x 2 days. She reports pain to both legs all over; does not illicit specific joints. Has been taking Tylenol for the pain without relief. No recent falls per family. Denies chest pain, shortness of breath, fever, chills, rash, or any other associated symptoms.      Past Medical History:  Diagnosis Date  . Achilles tendinitis   . Allergic rhinitis   . Anemia of chronic disease    at least back to 2010  . Anxiety   . Benign paroxysmal positional vertigo   . Cataract   . Cerebrovascular accident ([place])   . Chronic kidney disease (CKD), stage III (moderate) (HCC)   . Depression   . DJD (degenerative joint disease)   . DM (diabetes mellitus) ([place])    type II  . GERD (gastroesophageal reflux disease)   . HTN (hypertension)   . HX: breast cancer   . Hyperlipidemia   . Hypertonicity of bladder   . Knee pain, left   . Leg pain   . Lymphocytic colitis 2008   treated with Lialda short-term  . Memory loss   . Obesity   . Osteoporosis, unspecified   . Renal disease    stage III  . Unspecified fall   . Unspecified glaucoma(365.9)     Patient Active Problem List   Diagnosis Date Noted  . Gait abnormality 09/13/2018  . Mild cognitive impairment 09/13/2018  . Acute renal failure superimposed on  stage 3 chronic kidney disease ([place]) 07/06/2018  . Uncontrolled hypertension 07/06/2018  . Fall 07/04/2018  . Scalp laceration 07/04/2018  . Acute bronchitis due to Rhinovirus 03/20/2018  . Diabetic hyperosmolar non-ketotic state ([place]) 03/18/2018  . Acute respiratory failure with hypoxia ([place]) 03/18/2018  . CKD (chronic kidney disease) stage 4, GFR 15-29 ml/min (HCC) 03/18/2018  . Acute lower UTI 02/09/2018  . Nausea 02/09/2018  . Constipation 02/09/2018  . Diarrhea 11/25/2011  . Lymphocytic colitis 06/15/2011  . Esophageal dysphagia 06/15/2011  . UNSPECIFIED ANEMIA 04/27/2009  . ACHILLES TENDINITIS 10/27/2008  . PERIPHERAL EDEMA 10/14/2008  . ALLERGIC RHINITIS, SEASONAL 03/26/2008  . RENAL DISEASE, CHRONIC, STAGE III 03/26/2008  . DEGENERATIVE DISC DISEASE 01/15/2008  . Type 2 diabetes mellitus with hyperlipidemia ([place]) 12/13/2007  . HYPERLIPIDEMIA 10/11/2006  . OBESITY NOS 10/11/2006  . ANXIETY 10/11/2006  . Depression 10/11/2006  . GLAUCOMA NOS 10/11/2006  . BENIGN POSITIONAL VERTIGO 10/11/2006  . Essential hypertension 10/11/2006  . GERD 10/11/2006  . OVERACTIVE BLADDER 10/11/2006  . OSTEOPOROSIS 10/11/2006  . BREAST CANCER, HX OF 10/11/2006  . CEREBROVASCULAR ACCIDENT, HX OF 10/11/2006    Past Surgical History:  Procedure Laterality Date  . Bladder tack  2000  . BREAST LUMPECTOMY  2004  right  . cataracts    . CHOLECYSTECTOMY  1960  . COLONOSCOPY  07/2007   lymphocytic colitis  . ESOPHAGOGASTRODUODENOSCOPY  06/24/2011   Procedure: ESOPHAGOGASTRODUODENOSCOPY (EGD);  Surgeon: Dorothyann Peng, MD;  [facility];  Service: Endoscopy;  Laterality: N/A;.  Peptic stricture, mild erosive reflux esophagitis, hiatal  . FLEXIBLE SIGMOIDOSCOPY  11/25/2011   MODERATE DIVERTICULOSIS/INTERNAL HEMORRHOIDS  . S/P Hysterectomy  2000   benign reasons  . SAVORY DILATION  06/24/2011   Procedure: SAVORY DILATION;  Surgeon: Dorothyann Peng, MD;  [facility]: [facility];  Service:  Endoscopy;  Laterality: N/A;     OB History   No obstetric history on file.      Home Medications    Prior to Admission medications   Medication Sig Start Date End Date Taking? Authorizing Provider  acetaminophen (TYLENOL) 500 MG tablet Take 500 mg by mouth 3 (three) times daily.    Yes [provider]  amLODipine (NORVASC) 10 MG tablet Take 1 tablet (10 mg total) by mouth daily. Patient taking differently: Take 5 mg by mouth daily.  07/07/18  Yes Dhungel, Nishant, MD  aspirin 81 MG tablet Take 81 mg by mouth daily.     Yes [provider]  Cholecalciferol (VITAMIN D) 2000 units CAPS Take 1 capsule by mouth daily.   Yes [provider]  clonazePAM (KLONOPIN) 1 MG tablet Take 1 mg by mouth at bedtime. *May take one tablet every 6 hours as needed for anxiety up to three times daily   Yes [provider]  docusate sodium (COLACE) 100 MG capsule Take 100 mg by mouth 2 (two) times daily as needed for mild constipation.   Yes [provider]  ezetimibe (ZETIA) 10 MG tablet Take 1 tablet by mouth daily. 01/24/18  Yes [provider]  fish oil-omega-3 fatty acids 1000 MG capsule Take 1 g by mouth daily.    Yes [provider]  furosemide (LASIX) 20 MG tablet Take 1 tablet by mouth daily. 03/07/18  Yes [provider]  glimepiride (AMARYL) 1 MG tablet Take 1 tablet by mouth daily. 04/24/18  Yes [provider]  latanoprost (XALATAN) 0.005 % ophthalmic solution Place 1 drop into both eyes daily.  06/02/11  Yes [provider]  loperamide (IMODIUM A-D) 2 MG tablet Take 2 mg by mouth 4 (four) times daily as needed for diarrhea or loose stools.   Yes [provider]  meclizine (ANTIVERT) 25 MG tablet Take 25 mg by mouth 4 (four) times daily as needed for dizziness.   Yes [provider]  methocarbamol (ROBAXIN) 500 MG tablet Take 1 tablet (500 mg total) by mouth every 8 (eight) hours as needed (muscle  soreness). 08/07/18  Yes Rolland Porter, MD  omeprazole (PRILOSEC) 40 MG capsule Take 1 capsule by mouth daily.  01/23/18  Yes [provider]  PROAIR HFA 108 (90 Base) MCG/ACT inhaler Take 1-2 puffs by mouth every 6 (six) hours as needed. 03/17/18  Yes [provider]  sitaGLIPtin (JANUVIA) 25 MG tablet Take 1 tablet (25 mg total) by mouth daily. 03/20/18  Yes Tat, Shanon Brow, MD  timolol (TIMOPTIC) 0.5 % ophthalmic solution Place 1 drop into both eyes every 12 (twelve) hours.  01/17/18  Yes [provider]  vitamin B-12 (CYANOCOBALAMIN) 1000 MCG tablet Take 1 tablet (1,000 mcg total) by mouth daily. 03/20/18  Yes Tat, Shanon Brow, MD  HYDROcodone-acetaminophen (NORCO/VICODIN) 5-325 MG tablet Take 1 tablet by mouth every 8 (eight)  hours as needed for up to 4 doses for severe pain. 01/28/19   Eustaquio Maize, PA-C    Family History Family History  Problem Relation Age of Onset  . Arthritis Mother        died at age 10  . Other Father        died at age 3 - horse accident  . Colon cancer Neg Hx           . Liver disease Neg Hx           . GI problems Neg Hx             Social History Social History   Tobacco Use  . Smoking status: Former Smoker    Packs/day: 1.00    Types: Cigarettes  . Smokeless tobacco: Never Used  . Tobacco comment: 30 yrs ago  Substance Use Topics  . Alcohol use: No  . Drug use: No     Allergies   Metformin   Review of Systems Review of Systems  Constitutional: Negative for chills and fever.  Musculoskeletal: Positive for arthralgias and joint swelling. Negative for back pain.  Neurological: Negative for dizziness and light-headedness.     Physical Exam Updated Vital Signs BP (!) 184/68 (BP Location: Right Arm)   Pulse 70   Temp 98.4 F (36.9 C) (Oral)   Resp 18   Ht 5\' 2"  (1.575 m)   Wt 72.6 kg   SpO2 100%   BMI 29.26 kg/m   Physical Exam Vitals signs and nursing note reviewed.  Constitutional:      Appearance: She is not  ill-appearing.  HENT:     Head: Normocephalic and atraumatic.  Eyes:     Conjunctiva/sclera: Conjunctivae normal.  Neck:     Musculoskeletal: Neck supple.  Cardiovascular:     Rate and Rhythm: Normal rate and regular rhythm.     Pulses: Normal pulses.     Comments: 2+ DP pulses bilaterally Pulmonary:     Effort: Pulmonary effort is normal.     Breath sounds: Normal breath sounds.  Abdominal:     Palpations: Abdomen is soft.     Tenderness: There is no abdominal tenderness.  Musculoskeletal:        General: No swelling.     Comments: Mild tenderness to palpation of bilateral knees, R > L. ROM intact to left knee; difficulty assessing ROM of right knee due to pain. Effusion noted to right knee. No tenderness to all other joints including ankles, hips, wrists, elbows, and shoulders.   Skin:    General: Skin is warm and dry.  Neurological:     Mental Status: She is alert.      ED Treatments / Results  Labs (all labs ordered are listed, but only abnormal results are displayed) Labs Reviewed  COMPREHENSIVE METABOLIC PANEL - Abnormal; Notable for the following components:      Result Value   Glucose, Bld 107 (*)    BUN 40 (*)    Creatinine, Ser 2.01 (*)    Total Bilirubin 0.1 (*)    GFR calc non Af Amer 21 (*)    GFR calc Af Amer 25 (*)    All other components within normal limits  CBC - Abnormal; Notable for the following components:   RBC 3.84 (*)    Hemoglobin 10.6 (*)    HCT 35.0 (*)    All other components within normal limits  SYNOVIAL CELL COUNT + DIFF, W/ CRYSTALS - Abnormal;  Notable for the following components:   Color, Synovial RED (*)    Appearance-Synovial CLOUDY (*)    WBC, Synovial 481 (*)    Neutrophil, Synovial 67 (*)    Monocyte-Macrophage-Synovial Fluid 26 (*)    All other components within normal limits  GRAM STAIN  BODY FLUID CULTURE    EKG None  Radiology Dg Knee Complete 4 Views Right  Result Date: 01/28/2019 CLINICAL DATA:  Bilateral leg  pain worse in the right leg for 4 weeks. Knee pain. No recent injury. EXAM: RIGHT KNEE - COMPLETE 4+ VIEW COMPARISON:  09/04/2018 FINDINGS: Degenerative changes in the right knee with lateral greater than medial compartment narrowing and small osteophyte formation. No evidence of acute fracture or subluxation. No focal bone lesion or bone destruction. Bone cortex appears intact. Small right knee effusion. Scattered vascular calcifications. IMPRESSION: Mild degenerative changes in the right knee. Small effusion. No acute bony abnormalities. Electronically Signed   By: Lucienne Capers M.D.   On: 01/28/2019 19:14    Procedures .Joint Aspiration/Arthrocentesis Date/Time: 01/28/2019 10:48 PM Performed by: Noemi Chapel, MD Authorized by: Noemi Chapel, MD   Consent:    Consent obtained:  Written   Consent given by:  Patient   Risks discussed:  Bleeding, infection and pain Location:    Location:  Knee   Knee:  R knee Anesthesia (see MAR for exact dosages):    Anesthesia method:  Local infiltration   Local anesthetic:  Lidocaine 1% w/o epi Procedure details:    Preparation: Patient was prepped and draped in usual sterile fashion     Needle gauge:  18 G   Ultrasound guidance: no     Approach:  Lateral   Aspirate amount:  15 CCs   Aspirate characteristics:  Blood-tinged   Steroid injected: no     Specimen collected: yes   Post-procedure details:    Dressing:  Adhesive bandage   Patient tolerance of procedure:  Tolerated well, no immediate complications   (including critical care time)  Medications Ordered in ED Medications  lidocaine (PF) (XYLOCAINE) 1 % injection (has no administration in time range)  HYDROcodone-acetaminophen (NORCO/VICODIN) 5-325 MG per tablet 1 tablet (1 tablet Oral Given 01/28/19 1920)     Initial Impression / Assessment and Plan / ED Course  I have reviewed the triage vital signs and the nursing notes.  Pertinent labs & imaging results that were available during  my care of the patient were reviewed by me and considered in my medical decision making (see chart for details).    Presents with chronic bilateral leg pain, mostly in knees x "months" s/p ground level fall that occurred in August. Per chart review; pt was seen in the ED on 08/16 for ground level fall complaining of left knee pain and left forearm pain. Xrays were negative for fracture but shows arthritic changes and pt was discharged home to [facility]. She received PT at that time. Pt has been followed by PCP per family member in the room; taking Tylenol for pain. Has appointment scheduled with orthopedist next week regarding possible corticoid injections to knees. Family member brought pt in today due to worsening pain x 2 days. Will get x ray R knee considering it is significantly more tender than left and pt has not had imaging of knee in the past after the fall. 5-325 mg Vicodin ordered for patient as well. Will reevaluate after x ray.    9:09 PM X ray of right knee positive for arthritic changes and  small effusion. Suspect this is what is causing pain as left knee showed similar findings on x ray in august. Dr. Sabra Heck evaluated patient; would like to get joint aspiration to assess fluid. Pt to follow up with orthopedist next week as scheduled.   10:50 PM Joint aspiration completed by attending physician Dr. Sabra Heck; fluid sent off for cell count and gram stain  12:29 AM Cell count with 481 WBC and 67 neutrophils. Gram stain negative. Will send patient home with #4 Vicodin for pain. She has appointment with orthopedist next week for same to discuss corticoid injections; advised pt to keep this appointment. Return precautions discussed including fever, chills, worsening pain, redness or swelling to the knee joint.            Final Clinical Impressions(s) / ED Diagnoses   Final diagnoses:  Chronic pain of both knees  Arthritis  Effusion of right knee    ED Discharge Orders          Ordered    HYDROcodone-acetaminophen (NORCO/VICODIN) 5-325 MG tablet  Every 8 hours PRN     01/28/19 2111           Eustaquio Maize, PA-C 01/29/19 1093    Noemi Chapel, MD 01/30/19 1003

## 2019-01-28 NOTE — Discharge Instructions (Addendum)
Please keep appointment with your orthopedist next week as scheduled Take medication as needed for pain until you see them Follow up with your PCP as needed  Please return to the ED for any worsening pain, weakness, numbness, fever, chills, redness or swelling of right knee joint.

## 2019-01-28 NOTE — ED Triage Notes (Signed)
Bilateral leg pain x 3-4 weeks.  Worse on th RT leg.  Denies any injuries.  States her whole leg hurts not one particular area

## 2019-01-28 NOTE — ED Provider Notes (Signed)
Medical screening examination/treatment/procedure(s) were conducted as a shared visit with non-physician practitioner(s) and myself.  I personally evaluated the patient during the encounter.  Clinical Impression:   Final diagnoses:  Chronic pain of both knees  Arthritis      The patient is a 85 year old female who has had some progressive right leg pain especially around the knee.  She does report that she has had some bilateral leg pain but seems to be mostly on the right knee recently.  The patient denies having any fevers but on exam she does have what appears to be palpable joint effusion.  No fevers, otherwise unremarkable exam, she has difficulty moving her right knee secondary to pain but is able to flex to approximately 90 degrees, no joint arthropathy of the hip or the ankle the right side.  Left side seems to be unremarkable  .Joint Aspiration/Arthrocentesis Date/Time: 01/28/2019 10:46 PM Performed by: Noemi Chapel, MD Authorized by: Noemi Chapel, MD   Consent:    Consent obtained:  Written and verbal   Consent given by:  Patient   Risks discussed:  Bleeding, infection, pain, incomplete drainage and nerve damage   Alternatives discussed:  No treatment Location:    Location:  Knee   Knee:  R knee Anesthesia (see MAR for exact dosages):    Anesthesia method:  Local infiltration   Local anesthetic:  Lidocaine 1% w/o epi Procedure details:    Preparation: Patient was prepped and draped in usual sterile fashion     Needle gauge:  18 G   Ultrasound guidance: no     Approach:  Lateral   Aspirate amount:  15   Aspirate characteristics:  Blood-tinged   Steroid injected: no     Specimen collected: yes   Post-procedure details:    Dressing:  Sterile dressing   Patient tolerance of procedure:  Tolerated well, no immediate complications      Noemi Chapel, MD 01/30/19 1001

## 2019-01-29 LAB — GRAM STAIN: Gram Stain: NONE SEEN

## 2019-01-29 LAB — SYNOVIAL CELL COUNT + DIFF, W/ CRYSTALS
Crystals, Fluid: NONE SEEN
EOSINOPHILS-SYNOVIAL: 1 % (ref 0–1)
Lymphocytes-Synovial Fld: 6 % (ref 0–20)
Monocyte-Macrophage-Synovial Fluid: 26 % — ABNORMAL LOW (ref 50–90)
Neutrophil, Synovial: 67 % — ABNORMAL HIGH (ref 0–25)
WBC, Synovial: 481 /mm3 — ABNORMAL HIGH (ref 0–200)

## 2019-02-04 ENCOUNTER — Other Ambulatory Visit: Payer: Self-pay

## 2019-02-04 ENCOUNTER — Encounter: Payer: Self-pay | Admitting: Orthopedic Surgery

## 2019-02-04 ENCOUNTER — Ambulatory Visit: Payer: PPO | Admitting: Orthopedic Surgery

## 2019-02-04 VITALS — BP 160/82 | HR 77 | Ht 62.0 in | Wt 170.0 lb

## 2019-02-04 DIAGNOSIS — M170 Bilateral primary osteoarthritis of knee: Secondary | ICD-10-CM | POA: Diagnosis not present

## 2019-02-04 LAB — CULTURE, BODY FLUID W GRAM STAIN -BOTTLE: Culture: NO GROWTH

## 2019-02-04 NOTE — Patient Instructions (Signed)

## 2019-02-04 NOTE — Progress Notes (Signed)
NEW PATIENT OFFICE VISIT  Chief Complaint  Patient presents with  . Knee Pain    bilateral     85 year old female currently at assisted living presents with a one-year history of progressive onset of bilateral knee pain right worse than left currently on Tylenol 2 tablets twice a day and eventually placed on Norco 5 mg secondary to inadequate pain relief.  Patient is currently on a walker.  Patient reports no trauma.  She does note lateral knee pain on the right diffuse knee pain on the left wrist described as a dull ache with associated swelling and stiffness especially in the mornings   Review of Systems  Constitutional: Negative for fever.  Musculoskeletal: Positive for joint pain.  Neurological: Negative for tingling.  Psychiatric/Behavioral: Positive for memory loss.     Past Medical History:  Diagnosis Date  . Achilles tendinitis   . Allergic rhinitis   . Anemia of chronic disease    at least back to 2010  . Anxiety   . Benign paroxysmal positional vertigo   . Cataract   . Cerebrovascular accident ([place])   . Chronic kidney disease (CKD), stage III (moderate) (HCC)   . Depression   . DJD (degenerative joint disease)   . DM (diabetes mellitus) ([place])    type II  . GERD (gastroesophageal reflux disease)   . HTN (hypertension)   . HX: breast cancer   . Hyperlipidemia   . Hypertonicity of bladder   . Knee pain, left   . Leg pain   . Lymphocytic colitis 2008   treated with Lialda short-term  . Memory loss   . Obesity   . Osteoporosis, unspecified   . Renal disease    stage III  . Unspecified fall   . Unspecified glaucoma(365.9)     Past Surgical History:  Procedure Laterality Date  . Bladder tack  2000  . BREAST LUMPECTOMY  2004   right  . cataracts    . CHOLECYSTECTOMY  1960  . COLONOSCOPY  07/2007   lymphocytic colitis  . ESOPHAGOGASTRODUODENOSCOPY  06/24/2011   Procedure: ESOPHAGOGASTRODUODENOSCOPY (EGD);  Surgeon: Dorothyann Peng, MD;  [facility];  Service: Endoscopy;  Laterality: N/A;.  Peptic stricture, mild erosive reflux esophagitis, hiatal  . FLEXIBLE SIGMOIDOSCOPY  11/25/2011   MODERATE DIVERTICULOSIS/INTERNAL HEMORRHOIDS  . S/P Hysterectomy  2000   benign reasons  . SAVORY DILATION  06/24/2011   Procedure: SAVORY DILATION;  Surgeon: Dorothyann Peng, MD;  [facility];  Service: Endoscopy;  Laterality: N/A;    Family History  Problem Relation Age of Onset  . Arthritis Mother        died at age 69  . Other Father        died at age 66 - horse accident  . Colon cancer Neg Hx           . Liver disease Neg Hx           . GI problems Neg Hx            Social History   Tobacco Use  . Smoking status: Former Smoker    Packs/day: 1.00    Types: Cigarettes  . Smokeless tobacco: Never Used  . Tobacco comment: 30 yrs ago  Substance Use Topics  . Alcohol use: No  . Drug use: No    Allergies  Allergen Reactions  . Metformin     This allergy is not listed under records with [facility] facility.  Past records states that patient "felt poor" as a reaction to taking this medication    Current Meds  Medication Sig  . acetaminophen (TYLENOL) 500 MG tablet Take 500 mg by mouth 3 (three) times daily.   Marland Kitchen amLODipine (NORVASC) 10 MG tablet Take 1 tablet (10 mg total) by mouth daily. (Patient taking differently: Take 5 mg by mouth daily. )  . aspirin 81 MG tablet Take 81 mg by mouth daily.    . Cholecalciferol (VITAMIN D) 2000 units CAPS Take 1 capsule by mouth daily.  . clonazePAM (KLONOPIN) 1 MG tablet Take 1 mg by mouth at bedtime. *May take one tablet every 6 hours as needed for anxiety up to three times daily  . docusate sodium (COLACE) 100 MG capsule Take 100 mg by mouth 2 (two) times daily as needed for mild constipation.  Marland Kitchen ezetimibe (ZETIA) 10 MG tablet Take 1 tablet by mouth daily.  . fish oil-omega-3 fatty acids 1000 MG capsule Take 1 g by mouth daily.   . furosemide (LASIX) 20 MG tablet Take 1  tablet by mouth daily.  Marland Kitchen glimepiride (AMARYL) 1 MG tablet Take 1 tablet by mouth daily.  Marland Kitchen HYDROcodone-acetaminophen (NORCO/VICODIN) 5-325 MG tablet Take 1 tablet by mouth every 8 (eight) hours as needed for up to 4 doses for severe pain.  Marland Kitchen latanoprost (XALATAN) 0.005 % ophthalmic solution Place 1 drop into both eyes daily.   Marland Kitchen loperamide (IMODIUM A-D) 2 MG tablet Take 2 mg by mouth 4 (four) times daily as needed for diarrhea or loose stools.  . meclizine (ANTIVERT) 25 MG tablet Take 25 mg by mouth 4 (four) times daily as needed for dizziness.  . methocarbamol (ROBAXIN) 500 MG tablet Take 1 tablet (500 mg total) by mouth every 8 (eight) hours as needed (muscle soreness).  Marland Kitchen omeprazole (PRILOSEC) 40 MG capsule Take 1 capsule by mouth daily.   Marland Kitchen PROAIR HFA 108 (90 Base) MCG/ACT inhaler Take 1-2 puffs by mouth every 6 (six) hours as needed.  . sitaGLIPtin (JANUVIA) 25 MG tablet Take 1 tablet (25 mg total) by mouth daily.  . timolol (TIMOPTIC) 0.5 % ophthalmic solution Place 1 drop into both eyes every 12 (twelve) hours.   . vitamin B-12 (CYANOCOBALAMIN) 1000 MCG tablet Take 1 tablet (1,000 mcg total) by mouth daily.    BP (!) 160/82   Pulse 77   Ht 5\' 2"  (1.575 m)   Wt 170 lb (77.1 kg)   BMI 31.09 kg/m   Physical Exam Vitals signs and nursing note reviewed.  Constitutional:      Appearance: Normal appearance.  Musculoskeletal:     Right knee: She exhibits no effusion.     Left knee: She exhibits no effusion.  Neurological:     Mental Status: She is alert and oriented to person, place, and time.  Psychiatric:        Mood and Affect: Mood normal.     Right Knee Exam   Muscle Strength  The patient has normal right knee strength.  Tenderness  The patient is experiencing tenderness in the lateral joint line.  Range of Motion  Extension: normal  Flexion: 120   Tests  McMurray:  Medial - negative Lateral - negative Varus: negative Valgus: negative Drawer:  Anterior -  negative    Posterior - negative  Other  Erythema: absent Scars: absent Sensation: normal Pulse: present Swelling: none Effusion: no effusion present   Left Knee Exam   Muscle Strength  The patient has normal  left knee strength.  Tenderness  The patient is experiencing tenderness in the medial joint line.  Range of Motion  Extension: normal  Flexion: 120   Tests  McMurray:  Medial - negative Lateral - negative Varus: negative Valgus: negative Drawer:  Anterior - negative     Posterior - negative  Other  Erythema: absent Scars: absent Sensation: normal Pulse: present Swelling: none Effusion: no effusion present        MEDICAL DECISION SECTION  Xrays were done at X-rays were done at the hospital: Right knee is in valgus osteopenia 4 views were taken asymmetric narrowing of the lateral compartment mild subchondral sclerosis  X-ray taken January 28, 2019  X-ray on September 04, 2018 right knee similar findings with left knee showing osteoarthritis lateral compartment asymmetric narrowing osteopenia and subchondral sclerosis   Encounter Diagnosis  Name Primary?  . Primary osteoarthritis of both knees Yes    PLAN: (Rx., injectx, surgery, frx, mri/ct) 85 years old multiple medical problems currently assisted living not a good surgical candidate recommend bilateral injections continue arthritis treatment with Tylenol   Procedure note left knee injection verbal consent was obtained to inject left knee joint  Timeout was completed to confirm the site of injection  The medications used were 40 mg of Depo-Medrol and 1% lidocaine 3 cc  Anesthesia was provided by ethyl chloride and the skin was prepped with alcohol.  After cleaning the skin with alcohol a 20-gauge needle was used to inject the left knee joint. There were no complications. A sterile bandage was applied.   Procedure note right knee injection verbal consent was obtained to inject right knee  joint  Timeout was completed to confirm the site of injection  The medications used were 40 mg of Depo-Medrol and 1% lidocaine 3 cc  Anesthesia was provided by ethyl chloride and the skin was prepped with alcohol.  After cleaning the skin with alcohol a 20-gauge needle was used to inject the right knee joint. There were no complications. A sterile bandage was applied.  Follow/up 3 months as needed  No orders of the defined types were placed in this encounter.   Arther Abbott, MD  02/04/2019 10:40 AM

## 2019-02-14 DIAGNOSIS — R531 Weakness: Secondary | ICD-10-CM | POA: Diagnosis not present

## 2019-02-14 DIAGNOSIS — R609 Edema, unspecified: Secondary | ICD-10-CM | POA: Diagnosis not present

## 2019-02-14 DIAGNOSIS — I509 Heart failure, unspecified: Secondary | ICD-10-CM | POA: Diagnosis not present

## 2019-02-14 DIAGNOSIS — Z9181 History of falling: Secondary | ICD-10-CM | POA: Diagnosis not present

## 2019-02-28 DIAGNOSIS — M79675 Pain in left toe(s): Secondary | ICD-10-CM | POA: Diagnosis not present

## 2019-02-28 DIAGNOSIS — B351 Tinea unguium: Secondary | ICD-10-CM | POA: Diagnosis not present

## 2019-03-07 DIAGNOSIS — N390 Urinary tract infection, site not specified: Secondary | ICD-10-CM | POA: Diagnosis not present

## 2019-03-17 DIAGNOSIS — R609 Edema, unspecified: Secondary | ICD-10-CM | POA: Diagnosis not present

## 2019-03-17 DIAGNOSIS — Z9181 History of falling: Secondary | ICD-10-CM | POA: Diagnosis not present

## 2019-03-17 DIAGNOSIS — R531 Weakness: Secondary | ICD-10-CM | POA: Diagnosis not present

## 2019-03-17 DIAGNOSIS — I509 Heart failure, unspecified: Secondary | ICD-10-CM | POA: Diagnosis not present

## 2019-04-16 DIAGNOSIS — R531 Weakness: Secondary | ICD-10-CM | POA: Diagnosis not present

## 2019-04-16 DIAGNOSIS — I509 Heart failure, unspecified: Secondary | ICD-10-CM | POA: Diagnosis not present

## 2019-04-16 DIAGNOSIS — R609 Edema, unspecified: Secondary | ICD-10-CM | POA: Diagnosis not present

## 2019-04-16 DIAGNOSIS — Z9181 History of falling: Secondary | ICD-10-CM | POA: Diagnosis not present

## 2019-04-17 DIAGNOSIS — Z03818 Encounter for observation for suspected exposure to other biological agents ruled out: Secondary | ICD-10-CM | POA: Diagnosis not present

## 2019-05-17 DIAGNOSIS — Z9181 History of falling: Secondary | ICD-10-CM | POA: Diagnosis not present

## 2019-05-17 DIAGNOSIS — R609 Edema, unspecified: Secondary | ICD-10-CM | POA: Diagnosis not present

## 2019-05-17 DIAGNOSIS — R531 Weakness: Secondary | ICD-10-CM | POA: Diagnosis not present

## 2019-05-17 DIAGNOSIS — I509 Heart failure, unspecified: Secondary | ICD-10-CM | POA: Diagnosis not present

## 2019-06-16 DIAGNOSIS — I509 Heart failure, unspecified: Secondary | ICD-10-CM | POA: Diagnosis not present

## 2019-06-16 DIAGNOSIS — R609 Edema, unspecified: Secondary | ICD-10-CM | POA: Diagnosis not present

## 2019-06-16 DIAGNOSIS — R531 Weakness: Secondary | ICD-10-CM | POA: Diagnosis not present

## 2019-06-16 DIAGNOSIS — Z9181 History of falling: Secondary | ICD-10-CM | POA: Diagnosis not present

## 2019-06-17 ENCOUNTER — Telehealth: Payer: Self-pay | Admitting: Neurology

## 2019-06-17 NOTE — Telephone Encounter (Signed)
error 

## 2019-07-17 DIAGNOSIS — Z9181 History of falling: Secondary | ICD-10-CM | POA: Diagnosis not present

## 2019-07-17 DIAGNOSIS — I509 Heart failure, unspecified: Secondary | ICD-10-CM | POA: Diagnosis not present

## 2019-07-17 DIAGNOSIS — R531 Weakness: Secondary | ICD-10-CM | POA: Diagnosis not present

## 2019-07-17 DIAGNOSIS — R609 Edema, unspecified: Secondary | ICD-10-CM | POA: Diagnosis not present

## 2019-07-24 DIAGNOSIS — B351 Tinea unguium: Secondary | ICD-10-CM | POA: Diagnosis not present

## 2019-07-24 DIAGNOSIS — M79675 Pain in left toe(s): Secondary | ICD-10-CM | POA: Diagnosis not present

## 2019-08-28 DIAGNOSIS — Z03818 Encounter for observation for suspected exposure to other biological agents ruled out: Secondary | ICD-10-CM | POA: Diagnosis not present

## 2019-09-04 DIAGNOSIS — Z03818 Encounter for observation for suspected exposure to other biological agents ruled out: Secondary | ICD-10-CM | POA: Diagnosis not present

## 2019-09-20 DIAGNOSIS — Z03818 Encounter for observation for suspected exposure to other biological agents ruled out: Secondary | ICD-10-CM | POA: Diagnosis not present

## 2019-09-25 DIAGNOSIS — Z03818 Encounter for observation for suspected exposure to other biological agents ruled out: Secondary | ICD-10-CM | POA: Diagnosis not present

## 2019-10-01 DIAGNOSIS — M79675 Pain in left toe(s): Secondary | ICD-10-CM | POA: Diagnosis not present

## 2019-10-01 DIAGNOSIS — L600 Ingrowing nail: Secondary | ICD-10-CM | POA: Diagnosis not present

## 2019-10-01 DIAGNOSIS — M79674 Pain in right toe(s): Secondary | ICD-10-CM | POA: Diagnosis not present

## 2019-10-02 DIAGNOSIS — Z03818 Encounter for observation for suspected exposure to other biological agents ruled out: Secondary | ICD-10-CM | POA: Diagnosis not present

## 2019-10-31 DIAGNOSIS — Z03818 Encounter for observation for suspected exposure to other biological agents ruled out: Secondary | ICD-10-CM | POA: Diagnosis not present

## 2019-11-05 DIAGNOSIS — Z03818 Encounter for observation for suspected exposure to other biological agents ruled out: Secondary | ICD-10-CM | POA: Diagnosis not present

## 2019-11-07 DIAGNOSIS — Z20828 Contact with and (suspected) exposure to other viral communicable diseases: Secondary | ICD-10-CM | POA: Diagnosis not present

## 2019-11-12 DIAGNOSIS — Z03818 Encounter for observation for suspected exposure to other biological agents ruled out: Secondary | ICD-10-CM | POA: Diagnosis not present

## 2019-11-19 DIAGNOSIS — Z20828 Contact with and (suspected) exposure to other viral communicable diseases: Secondary | ICD-10-CM | POA: Diagnosis not present

## 2019-12-11 DIAGNOSIS — Z20828 Contact with and (suspected) exposure to other viral communicable diseases: Secondary | ICD-10-CM | POA: Diagnosis not present

## 2019-12-18 DIAGNOSIS — Z03818 Encounter for observation for suspected exposure to other biological agents ruled out: Secondary | ICD-10-CM | POA: Diagnosis not present

## 2019-12-25 DIAGNOSIS — Z03818 Encounter for observation for suspected exposure to other biological agents ruled out: Secondary | ICD-10-CM | POA: Diagnosis not present

## 2019-12-30 DIAGNOSIS — Z03818 Encounter for observation for suspected exposure to other biological agents ruled out: Secondary | ICD-10-CM | POA: Diagnosis not present

## 2020-01-01 DIAGNOSIS — Z03818 Encounter for observation for suspected exposure to other biological agents ruled out: Secondary | ICD-10-CM | POA: Diagnosis not present

## 2020-01-06 DIAGNOSIS — Z03818 Encounter for observation for suspected exposure to other biological agents ruled out: Secondary | ICD-10-CM | POA: Diagnosis not present

## 2020-01-08 DIAGNOSIS — Z03818 Encounter for observation for suspected exposure to other biological agents ruled out: Secondary | ICD-10-CM | POA: Diagnosis not present

## 2020-01-13 DIAGNOSIS — Z03818 Encounter for observation for suspected exposure to other biological agents ruled out: Secondary | ICD-10-CM | POA: Diagnosis not present

## 2020-01-15 DIAGNOSIS — M79674 Pain in right toe(s): Secondary | ICD-10-CM | POA: Diagnosis not present

## 2020-01-15 DIAGNOSIS — E119 Type 2 diabetes mellitus without complications: Secondary | ICD-10-CM | POA: Diagnosis not present

## 2020-01-15 DIAGNOSIS — H04123 Dry eye syndrome of bilateral lacrimal glands: Secondary | ICD-10-CM | POA: Diagnosis not present

## 2020-01-15 DIAGNOSIS — M79675 Pain in left toe(s): Secondary | ICD-10-CM | POA: Diagnosis not present

## 2020-01-15 DIAGNOSIS — L600 Ingrowing nail: Secondary | ICD-10-CM | POA: Diagnosis not present

## 2020-01-15 DIAGNOSIS — Z03818 Encounter for observation for suspected exposure to other biological agents ruled out: Secondary | ICD-10-CM | POA: Diagnosis not present

## 2020-01-15 DIAGNOSIS — H524 Presbyopia: Secondary | ICD-10-CM | POA: Diagnosis not present

## 2020-01-15 DIAGNOSIS — H401134 Primary open-angle glaucoma, bilateral, indeterminate stage: Secondary | ICD-10-CM | POA: Diagnosis not present

## 2020-03-24 DIAGNOSIS — M79675 Pain in left toe(s): Secondary | ICD-10-CM | POA: Diagnosis not present

## 2020-03-24 DIAGNOSIS — M79674 Pain in right toe(s): Secondary | ICD-10-CM | POA: Diagnosis not present

## 2020-03-24 DIAGNOSIS — B351 Tinea unguium: Secondary | ICD-10-CM | POA: Diagnosis not present

## 2020-04-17 DIAGNOSIS — N390 Urinary tract infection, site not specified: Secondary | ICD-10-CM | POA: Diagnosis not present

## 2020-04-19 ENCOUNTER — Other Ambulatory Visit (HOSPITAL_COMMUNITY)
Admission: RE | Admit: 2020-04-19 | Discharge: 2020-04-19 | Disposition: A | Discharge status: Home / Self Care | Payer: PPO | Source: Ambulatory Visit | Attending: Family Medicine | Admitting: Family Medicine

## 2020-04-19 DIAGNOSIS — N390 Urinary tract infection, site not specified: Secondary | ICD-10-CM | POA: Diagnosis not present

## 2020-04-24 LAB — URINE CULTURE: Culture: >=100000 — AB

## 2020-04-27 ENCOUNTER — Emergency Department (HOSPITAL_COMMUNITY): Payer: PPO

## 2020-04-27 ENCOUNTER — Inpatient Hospital Stay (HOSPITAL_COMMUNITY)
Admission: EM | Admit: 2020-04-27 | Discharge: 2020-04-30 | DRG: 481 | Disposition: A | Discharge status: Skilled Nursing Facility | Payer: PPO | Source: Skilled Nursing Facility | Attending: Internal Medicine | Admitting: Internal Medicine

## 2020-04-27 ENCOUNTER — Encounter (HOSPITAL_COMMUNITY): Payer: Self-pay

## 2020-04-27 ENCOUNTER — Other Ambulatory Visit: Payer: Self-pay

## 2020-04-27 DIAGNOSIS — Z9049 Acquired absence of other specified parts of digestive tract: Secondary | ICD-10-CM | POA: Diagnosis not present

## 2020-04-27 DIAGNOSIS — F0390 Unspecified dementia without behavioral disturbance: Secondary | ICD-10-CM | POA: Diagnosis present

## 2020-04-27 DIAGNOSIS — R410 Disorientation, unspecified: Secondary | ICD-10-CM | POA: Diagnosis not present

## 2020-04-27 DIAGNOSIS — I10 Essential (primary) hypertension: Secondary | ICD-10-CM | POA: Diagnosis not present

## 2020-04-27 DIAGNOSIS — H409 Unspecified glaucoma: Secondary | ICD-10-CM | POA: Diagnosis not present

## 2020-04-27 DIAGNOSIS — S72142A Displaced intertrochanteric fracture of left femur, initial encounter for closed fracture: Secondary | ICD-10-CM | POA: Diagnosis not present

## 2020-04-27 DIAGNOSIS — Z90710 Acquired absence of both cervix and uterus: Secondary | ICD-10-CM | POA: Diagnosis not present

## 2020-04-27 DIAGNOSIS — E1122 Type 2 diabetes mellitus with diabetic chronic kidney disease: Secondary | ICD-10-CM | POA: Diagnosis not present

## 2020-04-27 DIAGNOSIS — Y92129 Unspecified place in nursing home as the place of occurrence of the external cause: Secondary | ICD-10-CM

## 2020-04-27 DIAGNOSIS — W1830XA Fall on same level, unspecified, initial encounter: Secondary | ICD-10-CM | POA: Diagnosis present

## 2020-04-27 DIAGNOSIS — E785 Hyperlipidemia, unspecified: Secondary | ICD-10-CM

## 2020-04-27 DIAGNOSIS — F411 Generalized anxiety disorder: Secondary | ICD-10-CM | POA: Diagnosis not present

## 2020-04-27 DIAGNOSIS — Z8673 Personal history of transient ischemic attack (TIA), and cerebral infarction without residual deficits: Secondary | ICD-10-CM

## 2020-04-27 DIAGNOSIS — Z79899 Other long term (current) drug therapy: Secondary | ICD-10-CM | POA: Diagnosis not present

## 2020-04-27 DIAGNOSIS — E1169 Type 2 diabetes mellitus with other specified complication: Secondary | ICD-10-CM | POA: Diagnosis present

## 2020-04-27 DIAGNOSIS — N184 Chronic kidney disease, stage 4 (severe): Secondary | ICD-10-CM | POA: Diagnosis not present

## 2020-04-27 DIAGNOSIS — Z803 Family history of malignant neoplasm of breast: Secondary | ICD-10-CM | POA: Diagnosis not present

## 2020-04-27 DIAGNOSIS — R262 Difficulty in walking, not elsewhere classified: Secondary | ICD-10-CM | POA: Diagnosis not present

## 2020-04-27 DIAGNOSIS — Z741 Need for assistance with personal care: Secondary | ICD-10-CM | POA: Diagnosis not present

## 2020-04-27 DIAGNOSIS — K219 Gastro-esophageal reflux disease without esophagitis: Secondary | ICD-10-CM | POA: Diagnosis present

## 2020-04-27 DIAGNOSIS — S0990XA Unspecified injury of head, initial encounter: Secondary | ICD-10-CM | POA: Diagnosis not present

## 2020-04-27 DIAGNOSIS — Z7984 Long term (current) use of oral hypoglycemic drugs: Secondary | ICD-10-CM

## 2020-04-27 DIAGNOSIS — Z853 Personal history of malignant neoplasm of breast: Secondary | ICD-10-CM | POA: Diagnosis not present

## 2020-04-27 DIAGNOSIS — M25552 Pain in left hip: Secondary | ICD-10-CM | POA: Diagnosis not present

## 2020-04-27 DIAGNOSIS — E669 Obesity, unspecified: Secondary | ICD-10-CM | POA: Diagnosis not present

## 2020-04-27 DIAGNOSIS — S72142D Displaced intertrochanteric fracture of left femur, subsequent encounter for closed fracture with routine healing: Secondary | ICD-10-CM | POA: Diagnosis present

## 2020-04-27 DIAGNOSIS — Z743 Need for continuous supervision: Secondary | ICD-10-CM | POA: Diagnosis not present

## 2020-04-27 DIAGNOSIS — Z951 Presence of aortocoronary bypass graft: Secondary | ICD-10-CM | POA: Diagnosis not present

## 2020-04-27 DIAGNOSIS — R52 Pain, unspecified: Secondary | ICD-10-CM | POA: Diagnosis not present

## 2020-04-27 DIAGNOSIS — M810 Age-related osteoporosis without current pathological fracture: Secondary | ICD-10-CM | POA: Diagnosis not present

## 2020-04-27 DIAGNOSIS — Z9889 Other specified postprocedural states: Secondary | ICD-10-CM | POA: Diagnosis not present

## 2020-04-27 DIAGNOSIS — R41841 Cognitive communication deficit: Secondary | ICD-10-CM | POA: Diagnosis not present

## 2020-04-27 DIAGNOSIS — D62 Acute posthemorrhagic anemia: Secondary | ICD-10-CM | POA: Diagnosis not present

## 2020-04-27 DIAGNOSIS — S72002A Fracture of unspecified part of neck of left femur, initial encounter for closed fracture: Secondary | ICD-10-CM

## 2020-04-27 DIAGNOSIS — Z03818 Encounter for observation for suspected exposure to other biological agents ruled out: Secondary | ICD-10-CM | POA: Diagnosis not present

## 2020-04-27 DIAGNOSIS — W19XXXA Unspecified fall, initial encounter: Secondary | ICD-10-CM | POA: Diagnosis not present

## 2020-04-27 DIAGNOSIS — G3184 Mild cognitive impairment, so stated: Secondary | ICD-10-CM | POA: Diagnosis present

## 2020-04-27 DIAGNOSIS — M1990 Unspecified osteoarthritis, unspecified site: Secondary | ICD-10-CM | POA: Diagnosis not present

## 2020-04-27 DIAGNOSIS — N3281 Overactive bladder: Secondary | ICD-10-CM | POA: Diagnosis not present

## 2020-04-27 DIAGNOSIS — Z6831 Body mass index (BMI) 31.0-31.9, adult: Secondary | ICD-10-CM | POA: Diagnosis not present

## 2020-04-27 DIAGNOSIS — S72002D Fracture of unspecified part of neck of left femur, subsequent encounter for closed fracture with routine healing: Secondary | ICD-10-CM | POA: Diagnosis not present

## 2020-04-27 DIAGNOSIS — R296 Repeated falls: Secondary | ICD-10-CM | POA: Diagnosis not present

## 2020-04-27 DIAGNOSIS — Z87891 Personal history of nicotine dependence: Secondary | ICD-10-CM | POA: Diagnosis not present

## 2020-04-27 DIAGNOSIS — S51012A Laceration without foreign body of left elbow, initial encounter: Secondary | ICD-10-CM | POA: Diagnosis not present

## 2020-04-27 DIAGNOSIS — Z7982 Long term (current) use of aspirin: Secondary | ICD-10-CM

## 2020-04-27 DIAGNOSIS — F17200 Nicotine dependence, unspecified, uncomplicated: Secondary | ICD-10-CM | POA: Diagnosis not present

## 2020-04-27 DIAGNOSIS — Z4789 Encounter for other orthopedic aftercare: Secondary | ICD-10-CM | POA: Diagnosis not present

## 2020-04-27 DIAGNOSIS — Z9181 History of falling: Secondary | ICD-10-CM | POA: Diagnosis not present

## 2020-04-27 DIAGNOSIS — R279 Unspecified lack of coordination: Secondary | ICD-10-CM | POA: Diagnosis not present

## 2020-04-27 DIAGNOSIS — M6281 Muscle weakness (generalized): Secondary | ICD-10-CM | POA: Diagnosis not present

## 2020-04-27 DIAGNOSIS — Z8781 Personal history of (healed) traumatic fracture: Secondary | ICD-10-CM | POA: Diagnosis not present

## 2020-04-27 DIAGNOSIS — R4182 Altered mental status, unspecified: Secondary | ICD-10-CM | POA: Diagnosis not present

## 2020-04-27 DIAGNOSIS — D649 Anemia, unspecified: Secondary | ICD-10-CM | POA: Diagnosis not present

## 2020-04-27 DIAGNOSIS — I129 Hypertensive chronic kidney disease with stage 1 through stage 4 chronic kidney disease, or unspecified chronic kidney disease: Secondary | ICD-10-CM | POA: Diagnosis not present

## 2020-04-27 DIAGNOSIS — D638 Anemia in other chronic diseases classified elsewhere: Secondary | ICD-10-CM | POA: Diagnosis present

## 2020-04-27 DIAGNOSIS — Z419 Encounter for procedure for purposes other than remedying health state, unspecified: Secondary | ICD-10-CM

## 2020-04-27 DIAGNOSIS — Z20822 Contact with and (suspected) exposure to covid-19: Secondary | ICD-10-CM | POA: Diagnosis present

## 2020-04-27 DIAGNOSIS — I1310 Hypertensive heart and chronic kidney disease without heart failure, with stage 1 through stage 4 chronic kidney disease, or unspecified chronic kidney disease: Secondary | ICD-10-CM | POA: Diagnosis not present

## 2020-04-27 LAB — SARS CORONAVIRUS 2 BY RT PCR (HOSPITAL ORDER, PERFORMED IN ~~LOC~~ HOSPITAL LAB): SARS Coronavirus 2: NEGATIVE

## 2020-04-27 LAB — CBC WITH DIFFERENTIAL/PLATELET
Abs Immature Granulocytes: 0.03 10*3/uL (ref 0.00–0.07)
Basophils Absolute: 0 10*3/uL (ref 0.0–0.1)
Basophils Relative: 0 %
Eosinophils Absolute: 0.3 10*3/uL (ref 0.0–0.5)
Eosinophils Relative: 4 %
HCT: 34.2 % — ABNORMAL LOW (ref 36.0–46.0)
Hemoglobin: 10.6 g/dL — ABNORMAL LOW (ref 12.0–15.0)
Immature Granulocytes: 0 %
Lymphocytes Relative: 16 %
Lymphs Abs: 1.2 10*3/uL (ref 0.7–4.0)
MCH: 28.2 pg (ref 26.0–34.0)
MCHC: 31 g/dL (ref 30.0–36.0)
MCV: 91 fL (ref 80.0–100.0)
Monocytes Absolute: 0.6 10*3/uL (ref 0.1–1.0)
Monocytes Relative: 8 %
Neutro Abs: 5 10*3/uL (ref 1.7–7.7)
Neutrophils Relative %: 72 %
Platelets: 221 10*3/uL (ref 150–400)
RBC: 3.76 MIL/uL — ABNORMAL LOW (ref 3.87–5.11)
RDW: 14.4 % (ref 11.5–15.5)
WBC: 7.1 10*3/uL (ref 4.0–10.5)
nRBC: 0 % (ref 0.0–0.2)

## 2020-04-27 LAB — COMPREHENSIVE METABOLIC PANEL
ALT: 11 U/L (ref 0–44)
AST: 16 U/L (ref 15–41)
Albumin: 3.8 g/dL (ref 3.5–5.0)
Alkaline Phosphatase: 67 U/L (ref 38–126)
Anion gap: 12 (ref 5–15)
BUN: 39 mg/dL — ABNORMAL HIGH (ref 8–23)
CO2: 23 mmol/L (ref 22–32)
Calcium: 8.9 mg/dL (ref 8.9–10.3)
Chloride: 103 mmol/L (ref 98–111)
Creatinine, Ser: 2.27 mg/dL — ABNORMAL HIGH (ref 0.44–1.00)
GFR calc Af Amer: 21 mL/min — ABNORMAL LOW (ref >60)
GFR calc non Af Amer: 18 mL/min — ABNORMAL LOW (ref >60)
Glucose, Bld: 89 mg/dL (ref 70–99)
Potassium: 4.3 mmol/L (ref 3.5–5.1)
Sodium: 138 mmol/L (ref 135–145)
Total Bilirubin: 0.3 mg/dL (ref 0.3–1.2)
Total Protein: 7 g/dL (ref 6.5–8.1)

## 2020-04-27 LAB — HEMOGLOBIN A1C
Hgb A1c MFr Bld: 7.1 % — ABNORMAL HIGH (ref 4.8–5.6)
Mean Plasma Glucose: 157.07 mg/dL

## 2020-04-27 LAB — CBG MONITORING, ED: Glucose-Capillary: 190 mg/dL — ABNORMAL HIGH (ref 70–99)

## 2020-04-27 MED ORDER — VITAMIN B-12 1000 MCG PO TABS
1000.0000 ug | ORAL_TABLET | Freq: Every day | ORAL | Status: DC
  Administered 2020-04-28 – 2020-04-30 (×3): 1000 ug via ORAL
  Filled 2020-04-27 (×3): qty 1

## 2020-04-27 MED ORDER — MORPHINE SULFATE (PF) 2 MG/ML IV SOLN
0.5000 mg | INTRAVENOUS | Status: DC | PRN
  Administered 2020-04-27 – 2020-04-28 (×3): 0.5 mg via INTRAVENOUS
  Filled 2020-04-27 (×3): qty 1

## 2020-04-27 MED ORDER — TIMOLOL MALEATE 0.5 % OP SOLN
1.0000 [drp] | Freq: Two times a day (BID) | OPHTHALMIC | Status: DC
  Administered 2020-04-27 – 2020-04-30 (×5): 1 [drp] via OPHTHALMIC
  Filled 2020-04-27: qty 5

## 2020-04-27 MED ORDER — ASPIRIN EC 81 MG PO TBEC
81.0000 mg | DELAYED_RELEASE_TABLET | Freq: Every day | ORAL | Status: DC
  Administered 2020-04-28: 81 mg via ORAL
  Filled 2020-04-27 (×2): qty 1

## 2020-04-27 MED ORDER — HEPARIN SODIUM (PORCINE) 5000 UNIT/ML IJ SOLN
5000.0000 [IU] | Freq: Once | INTRAMUSCULAR | Status: AC
  Administered 2020-04-27: 5000 [IU] via SUBCUTANEOUS
  Filled 2020-04-27: qty 1

## 2020-04-27 MED ORDER — CEFAZOLIN SODIUM-DEXTROSE 2-4 GM/100ML-% IV SOLN
2.0000 g | INTRAVENOUS | Status: AC
  Administered 2020-04-28: 2 g via INTRAVENOUS
  Filled 2020-04-27 (×2): qty 100

## 2020-04-27 MED ORDER — DOCUSATE SODIUM 100 MG PO CAPS: 100.0000 mg | ORAL_CAPSULE | Freq: Two times a day (BID) | ORAL | Status: DC | PRN

## 2020-04-27 MED ORDER — ONDANSETRON HCL 4 MG/2ML IJ SOLN
4.0000 mg | Freq: Once | INTRAMUSCULAR | Status: AC
  Administered 2020-04-27: 4 mg via INTRAVENOUS
  Filled 2020-04-27: qty 2

## 2020-04-27 MED ORDER — INSULIN ASPART 100 UNIT/ML ~~LOC~~ SOLN
0.0000 [IU] | Freq: Three times a day (TID) | SUBCUTANEOUS | Status: DC
  Administered 2020-04-28: 1 [IU] via SUBCUTANEOUS
  Administered 2020-04-29 (×2): 7 [IU] via SUBCUTANEOUS
  Administered 2020-04-29: 3 [IU] via SUBCUTANEOUS
  Administered 2020-04-30: 2 [IU] via SUBCUTANEOUS

## 2020-04-27 MED ORDER — BISACODYL 10 MG RE SUPP: 10.0000 mg | Freq: Every day | RECTAL | Status: DC | PRN

## 2020-04-27 MED ORDER — ZOLPIDEM TARTRATE 5 MG PO TABS
5.0000 mg | ORAL_TABLET | Freq: Every evening | ORAL | Status: DC | PRN
  Administered 2020-04-29: 5 mg via ORAL
  Filled 2020-04-27: qty 1

## 2020-04-27 MED ORDER — INSULIN ASPART 100 UNIT/ML ~~LOC~~ SOLN
0.0000 [IU] | Freq: Every day | SUBCUTANEOUS | Status: DC
  Administered 2020-04-29: 2 [IU] via SUBCUTANEOUS

## 2020-04-27 MED ORDER — VITAMIN D3 25 MCG (1000 UNIT) PO TABS
2000.0000 [IU] | ORAL_TABLET | Freq: Every day | ORAL | Status: DC
  Administered 2020-04-27 – 2020-04-30 (×4): 2000 [IU] via ORAL
  Filled 2020-04-27 (×7): qty 2

## 2020-04-27 MED ORDER — CHLORHEXIDINE GLUCONATE 4 % EX LIQD
60.0000 mL | Freq: Once | CUTANEOUS | Status: AC
  Administered 2020-04-28: 4 via TOPICAL
  Filled 2020-04-27: qty 60

## 2020-04-27 MED ORDER — SODIUM CHLORIDE 0.45 % IV SOLN: INTRAVENOUS | Status: AC

## 2020-04-27 MED ORDER — METHOCARBAMOL 1000 MG/10ML IJ SOLN
500.0000 mg | Freq: Four times a day (QID) | INTRAVENOUS | Status: DC | PRN
  Filled 2020-04-27: qty 5

## 2020-04-27 MED ORDER — OMEGA-3 FATTY ACIDS 1000 MG PO CAPS: 1.0000 g | ORAL_CAPSULE | Freq: Every day | ORAL | Status: DC

## 2020-04-27 MED ORDER — SENNOSIDES-DOCUSATE SODIUM 8.6-50 MG PO TABS: 1.0000 | ORAL_TABLET | Freq: Every evening | ORAL | Status: DC | PRN

## 2020-04-27 MED ORDER — HYDROCODONE-ACETAMINOPHEN 5-325 MG PO TABS
1.0000 | ORAL_TABLET | Freq: Four times a day (QID) | ORAL | Status: DC | PRN
  Administered 2020-04-27: 1 via ORAL
  Filled 2020-04-27: qty 1

## 2020-04-27 MED ORDER — LOPERAMIDE HCL 2 MG PO CAPS: 2.0000 mg | ORAL_CAPSULE | Freq: Four times a day (QID) | ORAL | Status: DC | PRN

## 2020-04-27 MED ORDER — OMEGA-3-ACID ETHYL ESTERS 1 G PO CAPS
1.0000 g | ORAL_CAPSULE | Freq: Every day | ORAL | Status: DC
  Administered 2020-04-28 – 2020-04-30 (×3): 1 g via ORAL
  Filled 2020-04-27 (×3): qty 1

## 2020-04-27 MED ORDER — ALBUTEROL SULFATE HFA 108 (90 BASE) MCG/ACT IN AERS
1.0000 | INHALATION_SPRAY | Freq: Four times a day (QID) | RESPIRATORY_TRACT | Status: DC | PRN
  Administered 2020-04-27: 2 via RESPIRATORY_TRACT
  Filled 2020-04-27: qty 6.7

## 2020-04-27 MED ORDER — EZETIMIBE 10 MG PO TABS
10.0000 mg | ORAL_TABLET | Freq: Every day | ORAL | Status: DC
  Administered 2020-04-28 – 2020-04-30 (×3): 10 mg via ORAL
  Filled 2020-04-27 (×3): qty 1

## 2020-04-27 MED ORDER — POVIDONE-IODINE 10 % EX SWAB
2.0000 "application " | Freq: Once | CUTANEOUS | Status: AC
  Administered 2020-04-28: 2 via TOPICAL

## 2020-04-27 MED ORDER — SODIUM CHLORIDE 0.9 % IV SOLN: INTRAVENOUS | Status: DC

## 2020-04-27 MED ORDER — METHOCARBAMOL 500 MG PO TABS
500.0000 mg | ORAL_TABLET | Freq: Four times a day (QID) | ORAL | Status: DC | PRN
  Administered 2020-04-27: 500 mg via ORAL
  Filled 2020-04-27: qty 1

## 2020-04-27 MED ORDER — PANTOPRAZOLE SODIUM 40 MG PO TBEC
40.0000 mg | DELAYED_RELEASE_TABLET | Freq: Every day | ORAL | Status: DC
  Administered 2020-04-28 – 2020-04-30 (×3): 40 mg via ORAL
  Filled 2020-04-27 (×3): qty 1

## 2020-04-27 MED ORDER — LATANOPROST 0.005 % OP SOLN
1.0000 [drp] | Freq: Every day | OPHTHALMIC | Status: DC
  Administered 2020-04-27 – 2020-04-30 (×4): 1 [drp] via OPHTHALMIC
  Filled 2020-04-27: qty 2.5

## 2020-04-27 MED ORDER — HYDROMORPHONE HCL 1 MG/ML IJ SOLN
0.5000 mg | Freq: Once | INTRAMUSCULAR | Status: AC
  Administered 2020-04-27: 0.5 mg via INTRAVENOUS
  Filled 2020-04-27: qty 1

## 2020-04-27 MED ORDER — AMLODIPINE BESYLATE 5 MG PO TABS
5.0000 mg | ORAL_TABLET | Freq: Every day | ORAL | Status: DC
  Administered 2020-04-28 – 2020-04-30 (×3): 5 mg via ORAL
  Filled 2020-04-27 (×4): qty 1

## 2020-04-27 NOTE — Progress Notes (Signed)
Called from [facility] ER, patient with left IT fx.   Plan for medical optimization, npo after midnight, transfer to [place], surgery tomorrow with intramedullary nail fixation. Full consult to follow.    Johnny Bridge, MD

## 2020-04-27 NOTE — H&P (Deleted)
[facility] History & Physical  Rob Doctor, hospital MD  Nancy Medina 04/27/2020  Chief Complaint: L hip pain after fall HPI: The patient is a 86 y.o. year-old w/ hx of prior strokes, CKD III, breast cancer and DM2, memory loss fell at the nursing home today and came to ED w/ L hip pain. Xrays show left inter-trochanteric fracture.  Ortho contacted and would like pt admitted to [facility] for surgery tomorrow (full consult to follow). Asked to see for admission.   Pt seen in ED on stretcher in the hall.  States she has been "unusually health" most her life. Had GB surgery. Has diabetes. Lives at SNF and was going to exercise class this am when she fell.  Baseline mobility if walking w/ her walker.  No prior fractures she can remember.  Her daughter has POA.  She is okay w/ having the surgery to fix her hip.    Denies SOB, cough, fevers, CP, n/v/d or abd pain, no voiding issues.   ROS  denies CP  no joint pain   no HA  no blurry vision  no rash  no diarrhea  no nausea/ vomiting  no dysuria  no difficulty voiding  no change in urine color    Past Medical History  Past Medical History:  Diagnosis Date  . Allergy   . Arthritis    neck  . Cataract    bilateral - MD monitoring cataracts  . CHF (congestive heart failure) ([place])   . Chronic kidney disease, stage I    DR OTTELIN  HX UTIS  . Cirrhosis ([place])   . Cramp of limb   . Diabetes mellitus   . Dysphagia, unspecified(787.20)   . Dysuria   . Epistaxis   . GERD (gastroesophageal reflux disease)   . Heart murmur    NO CARDIOLOGIST  DX FOR YEARS ASYMPTOMATIC  . Lumbago   . Neoplasm of uncertain behavior of skin   . Nonspecific elevation of levels of transaminase or lactic acid dehydrogenase (LDH)   . Osteoarthrosis, unspecified whether generalized or localized, unspecified site   . Other and unspecified hyperlipidemia    diet controlled  . Pain in joint, shoulder region   . Paresthesias 10/14/2015  .  Postablative ovarian failure   . Trochanteric bursitis of left hip 06/28/2016  . Type 2 diabetes mellitus without complication ([place])   . Unspecified essential hypertension    no meds   Past Surgical History  Past Surgical History:  Procedure Laterality Date  . BREAST BIOPSY    . CARDIAC CATHETERIZATION N/A 08/10/2016   Procedure: Left Heart Cath and Coronary Angiography;  Surgeon: Belva Crome, MD;  Location: [place] CV LAB;  Service: Cardiovascular;  Laterality: N/A;  . COLONOSCOPY  2012   Dr Lajoyce Corners.   . COLONOSCOPY WITH PROPOFOL N/A 01/19/2017   Procedure: COLONOSCOPY WITH PROPOFOL;  Surgeon: Milus Banister, MD;  Location: [facility];  Service: Endoscopy;  Laterality: N/A;  . CORONARY ARTERY BYPASS GRAFT N/A 08/11/2016   Procedure: CORONARY ARTERY BYPASS GRAFTING (CABG) x 3 USING RIGHT LEG GREATER SAPHENOUS VEIN GRAFT;  Surgeon: Melrose Nakayama, MD;  Location: [place];  Service: Open Heart Surgery;  Laterality: N/A;  . ENDOVEIN HARVEST OF GREATER SAPHENOUS VEIN Right 08/11/2016   Procedure: ENDOVEIN HARVEST OF GREATER SAPHENOUS VEIN;  Surgeon: Melrose Nakayama, MD;  Location: [place];  Service: Open Heart Surgery;  Laterality: Right;  . ESOPHAGEAL BANDING  10/10/2019   Procedure: ESOPHAGEAL  BANDING;  Surgeon: Milus Banister, MD;  Location: [facility];  Service: Endoscopy;;  . ESOPHAGEAL BANDING  10/20/2019   Procedure: ESOPHAGEAL BANDING;  Surgeon: Juanita Craver, MD;  Location: [facility] ENDOSCOPY;  Service: Endoscopy;;  . ESOPHAGOGASTRODUODENOSCOPY N/A 10/20/2019   Procedure: ESOPHAGOGASTRODUODENOSCOPY (EGD);  Surgeon: Juanita Craver, MD;  Location: [facility] ENDOSCOPY;  Service: Endoscopy;  Laterality: N/A;  . ESOPHAGOGASTRODUODENOSCOPY (EGD) WITH PROPOFOL N/A 01/19/2017   Procedure: ESOPHAGOGASTRODUODENOSCOPY (EGD) WITH PROPOFOL;  Surgeon: Milus Banister, MD;  [facility];  Service: Endoscopy;  Laterality: N/A;  . ESOPHAGOGASTRODUODENOSCOPY (EGD) WITH PROPOFOL N/A 10/10/2019    Procedure: ESOPHAGOGASTRODUODENOSCOPY (EGD) WITH PROPOFOL;  Surgeon: Milus Banister, MD;  [facility];  Service: Endoscopy;  Laterality: N/A;  . HEMOSTASIS CLIP PLACEMENT  10/20/2019   Procedure: HEMOSTASIS CLIP PLACEMENT;  Surgeon: Juanita Craver, MD;  Location: [place] ENDOSCOPY;  Service: Endoscopy;;  . IR ANGIOGRAM SELECTIVE EACH ADDITIONAL VESSEL  10/21/2019  . IR EMBO ART  VEN HEMORR LYMPH EXTRAV  INC GUIDE ROADMAPPING  10/21/2019  . IR PARACENTESIS  10/21/2019  . IR TIPS  10/21/2019  . MAXIMUM ACCESS (MAS)POSTERIOR LUMBAR INTERBODY FUSION (PLIF) 1 LEVEL Left 12/23/2015   Procedure: FOR MAXIMUM ACCESS (MAS) POSTERIOR LUMBAR INTERBODY FUSION (PLIF) LUMBAR THREE-FOUR EXTRAFORAMINAL MICRODISCECTOMY LUMBAR FIVE-SACRAL ONE LEFT;  Surgeon: Eustace Moore, MD;  Location: [place] NEURO ORS;  Service: Neurosurgery;  Laterality: Left;  . RADIOLOGY WITH ANESTHESIA N/A 10/21/2019   Procedure: RADIOLOGY WITH ANESTHESIA;  Surgeon: Radiologist, Medication, MD;  Location: [place];  Service: Radiology;  Laterality: N/A;  . SCLEROTHERAPY  10/20/2019   Procedure: SCLEROTHERAPY;  Surgeon: Juanita Craver, MD;  Location: [facility] ENDOSCOPY;  Service: Endoscopy;;  . TEE WITHOUT CARDIOVERSION N/A 08/11/2016   Procedure: TRANSESOPHAGEAL ECHOCARDIOGRAM (TEE);  Surgeon: Melrose Nakayama, MD;  Location: [place];  Service: Open Heart Surgery;  Laterality: N/A;  . TUBAL LIGATION  1982   Dr Connye Burkitt  . UPPER GASTROINTESTINAL ENDOSCOPY    . VAGINAL HYSTERECTOMY  1997   Dr Rande Lawman   Family History  Family History  Problem Relation Age of Onset  . Lung cancer Father   . Arthritis Sister   . Arthritis Brother   . Heart disease Maternal Grandmother   . Heart disease Maternal Grandfather   . Heart disease Paternal Grandmother   . Heart disease Paternal Grandfather   . Breast cancer Mother   . Liver cancer Brother   . Breast cancer Maternal Aunt   . Breast cancer Paternal Aunt   . Colon cancer Neg Hx   . Esophageal cancer Neg  Hx   . Rectal cancer Neg Hx   . Stomach cancer Neg Hx    Social History  reports that she has never smoked. She has never used smokeless tobacco. She reports that she does not drink alcohol or use drugs. Allergies  Allergies  Allergen Reactions  . Kiwi Extract Anaphylaxis  . Tdap [Tetanus-Diphth-Acell Pertussis] Swelling and Other (See Comments)    Swelling at injection site, gets very hot  . Statins     RHABDOMYOLYSIS  . Latex Itching, Dermatitis and Rash  . Tramadol Nausea And Vomiting   Home medications Prior to Admission medications   Medication Sig Start Date End Date Taking? Authorizing Provider  acetaminophen (TYLENOL) 500 MG tablet Take 500 mg by mouth at bedtime.     [provider]  Aromatic Inhalants (VICKS VAPOR IN) Vicks Vapor Rub apply small amount to outside of nose to help breathing    [provider]  BD PEN NEEDLE NANO U/F 32G X 4 MM MISC USE THREE TIMES DAILY AS DIRECTED 06/10/19   Reed, Tiffany L, DO  Biotin 10000 MCG TABS Take 10,000 mcg by mouth every morning.    [provider]  bisacodyl (DULCOLAX) 10 MG suppository Place 1 suppository (10 mg total) rectally daily as needed for moderate constipation. 10/25/19   Aline August, MD  calcium carbonate (OS-CAL) 600 MG TABS Take 600 mg by mouth 2 (two) times daily with a meal.      [provider]  Cholecalciferol (VITAMIN D) 50 MCG (2000 UT) CAPS Take 2,000 Units by mouth daily.     [provider]  Cyanocobalamin (VITAMIN B 12 PO) Take 1,000 mcg by mouth daily.      [provider]  ezetimibe (ZETIA) 10 MG tablet TAKE 1 TABLET(10 MG) BY MOUTH DAILY 07/10/19   Reed, Tiffany L, DO  furosemide (LASIX) 40 MG tablet Take 40 mg by mouth daily.     [provider]  glucose blood test strip One Touch Ultra II strips. Use to test blood sugar three times daily. Dx: E11.65 12/14/17   Reed, Tiffany L, DO  insulin detemir (LEVEMIR) 100 UNIT/ML injection Inject 0.2 mLs  (20 Units total) into the skin at bedtime. 10/26/19   Aline August, MD  Insulin Syringe-Needle U-100 (INSULIN SYRINGE 1CC/31GX5/16") 31G X 5/16" 1 ML MISC USE AS DIRECTED DAILY WITH LEVEMIR 02/08/19   Reed, Tiffany L, DO  JARDIANCE 25 MG TABS tablet Take 25 mg by mouth daily. 11/12/19   [provider]  lactulose (CHRONULAC) 10 GM/15ML solution Take 20 g by mouth 3 (three) times daily. 11/16/19   [provider]  loratadine (CLARITIN) 10 MG tablet Take 10 mg by mouth daily as needed for allergies.    [provider]  MAGNESIUM PO Take 500 mg by mouth 2 (two) times daily in the am and at bedtime..    [provider]  Multiple Vitamins-Minerals (MULTIVITAMIN WITH MINERALS) tablet Take 1 tablet by mouth daily.      [provider]  NOVOLOG FLEXPEN 100 UNIT/ML FlexPen Inject 12 units under the skin every morning, 8 units at lunch and 12 units at supper 09/03/19   Reed, Tiffany L, DO  ondansetron (ZOFRAN) 4 MG tablet Take 1 tablet (4 mg total) by mouth every 6 (six) hours as needed for nausea. 10/25/19   Aline August, MD  pantoprazole (PROTONIX) 40 MG tablet Take 1 tablet (40 mg total) by mouth 2 (two) times daily. 10/25/19   Aline August, MD  Polyethyl Glycol-Propyl Glycol (SYSTANE OP) Place 1 drop into both eyes 2 (two) times daily.    [provider]  Probiotic Product (PROBIOTIC DAILY PO) Take 1 capsule by mouth daily. Digestive Advantage Probiotic    [provider]  spironolactone (ALDACTONE) 50 MG tablet Take 1 tablet (50 mg total) by mouth 2 (two) times daily. 10/28/19   Gayland Curry, DO   Liver Function Tests Recent Labs  Lab 11/25/19 1436 11/27/19 1042  AST 58* 62*  ALT 41* 45*  ALKPHOS  --  127*  BILITOT 3.5* 3.4*  PROT 7.8 7.2  ALBUMIN  --  3.0*   Recent Labs  Lab 11/27/19 1042  LIPASE 29   CBC Recent Labs  Lab 11/25/19 1436 11/27/19 1042 11/27/19 1056  WBC 10.4 7.7  --   NEUTROABS 8,299* 5.9  --   HGB  16.1* 14.9 15.0  HCT 47.2* 43.4 44.0  MCV 92.5 94.6  --   PLT 151 116*  --    Basic Metabolic Panel Recent Labs  Lab 11/25/19 1436 11/27/19 1042 11/27/19 1056  NA 133* 131* 133*  K 4.5 4.6 4.6  CL 96* 97*  --   CO2 24 23  --   GLUCOSE 269* 138*  --   BUN 19 20  --   CREATININE 1.05* 1.13*  --   CALCIUM 11.1* 10.0  --    Iron/TIBC/Ferritin/ %Sat    Component Value Date/Time   IRON 29 11/19/2016 1422   TIBC 427 11/19/2016 1422   FERRITIN 13 11/19/2016 1422   IRONPCTSAT 7 (L) 11/19/2016 1422    Vitals:   11/27/19 1018 11/27/19 1030 11/27/19 1045 11/27/19 1215  BP: (!) 123/51 (!) 101/46  (!) 125/54  Pulse: 89 83    Resp: '16 15  20  ' Temp: 97.8 F (36.6 C)  97.9 F (36.6 C)   TempSrc: Oral  Rectal   SpO2: 99% 98%      Exam Gen alert, pleasant elderly WF not in distress No rash, cyanosis or gangrene Sclera anicteric, throat clear  No jvd or bruits Chest clear bilat to bases no rales, wheezing or bronchial BS RRR no MRG Abd soft ntnd no mass or ascites +bs GU defer MS LLE is rotated out, no wounds or ulcers Ext no LE edema Neuro is alert, nonfocal, mild gen'd weakness  doesn't know year, knows is in hospital, thought was in [place] but is in [place] meds:  - norvasc 5/ lasix 20/ zetia 10 qd/ asa 81 mg  - sitagliptin 25 qd/ glimepiride 30m qd  - clonazepam 115mhs and 36m50mh tid prn  - prilosec 40  - prn robaxin/ antivert/ proair/ norco  - prn's/ vitamins/ supplements/ eyedrops    L hip xray 6/7 -- IMPRESSION: Displaced and angulated intertrochanteric fracture LEFT femur   CXR - no acute disease   CT head - IMPRESSION: Stable atrophy with periventricular small vessel disease. Prior focal infarct in the left occipital lobe as well as lacunar type infarcts in each posterior cerebellar hemisphere. No acute infarct evident. No mass or hemorrhage.  There are foci of arterial vascular calcification. There is opacification and mucosal thickening in several  ethmoid air cells.    B/L creat - 2019  > 1.60- 2.41, egfr 16- 27 ml/min                     - march 2020 >  2.01, egfr 21                     - today  2.27    Assessment/ Plan: 1. Fall/ L hip fracture - in lady w/ CKD IV, DM and mild HTN , no hx cardiac diseases. Ambulatory at baseline w/ a walker. Per ortho plan is for admit to [facility] after [facility], and surgery tomorrow. Have d/w pt's POA her dtr who consents in general to the current plan. She is on her way to the hospital here in [place]. DVT proph - will give 1 dose SQ hep 5000 now, then post-op DVT proph to be determined by orthopedic service 3. CKD IV - b/l creat 2.01 in march 2020, 2.2 here today.  Stable, vol okay, K+ wnl 4. DM2 - on oral agents at home, plan SSI for now qid 5. HTN/ vol  - cont norvasc, hold lasix for now,  no vol excess on exam today 6. HL - home meds 7. Memory loss - per hx 8. H/o CVA's - head CT confirms prior CVA's      Kelly Splinter, MD   [facility] 11/27/2019, 1:10 PM

## 2020-04-27 NOTE — ED Triage Notes (Signed)
[place] brought pt from [place]. Pt fell as she was attempting to sit down at the table to eat. Pt landed on her sacrum. Left hip shortness and rotated out per EMS. Left elbow S/t. BS= 120. EMS vitals : 180/80, 70, 97% RA, 20, 97.4

## 2020-04-27 NOTE — Progress Notes (Signed)
Pt has arrived via [place] from [facility] ED. Pt has a L hip fx with obvious shortening and external rotation. Rating pain 10/10 with any  Movement. Will drift off to sleep and awakens easily. Oriented to the room & call bell explained & in reach. Purewick initiated.  Unanswered phone calls made to pts daughter Pam.

## 2020-04-27 NOTE — ED Provider Notes (Signed)
[facility] EMERGENCY DEPARTMENT Provider Note   CSN: [id number] Arrival date & time: 04/27/20  1044     History Chief Complaint  Patient presents with  . Fall    Nancy Medina is a 86 y.o. female.  Patient status post fall with complaint of left hip pain also hit her head.  Also has a skin tear to her left elbow area.  Brought in by EMS from [place].  Past medical history is significant for type 2 diabetes chronic kidney disease stage III prior strokes.  And a history of breast cancer.  History of chronic anemia.  And a history of some memory loss.  Patient also believes that she did hit her head but no loss of consciousness.        Past Medical History:  Diagnosis Date  . Achilles tendinitis   . Allergic rhinitis   . Anemia of chronic disease    at least back to 2010  . Anxiety   . Benign paroxysmal positional vertigo   . Cataract   . Cerebrovascular accident ([place])   . Chronic kidney disease (CKD), stage III (moderate)   . Depression   . DJD (degenerative joint disease)   . DM (diabetes mellitus) ([place])    type II  . GERD (gastroesophageal reflux disease)   . HTN (hypertension)   . HX: breast cancer   . Hyperlipidemia   . Hypertonicity of bladder   . Knee pain, left   . Leg pain   . Lymphocytic colitis 2008   treated with Lialda short-term  . Memory loss   . Obesity   . Osteoporosis, unspecified   . Renal disease    stage III  . Unspecified fall   . Unspecified glaucoma(365.9)     Patient Active Problem List   Diagnosis Date Noted  . Gait abnormality 09/13/2018  . Mild cognitive impairment 09/13/2018  . Acute renal failure superimposed on stage 3 chronic kidney disease ([place]) 07/06/2018  . Uncontrolled hypertension 07/06/2018  . Fall 07/04/2018  . Scalp laceration 07/04/2018  . Acute bronchitis due to Rhinovirus 03/20/2018  . Diabetic hyperosmolar non-ketotic state ([place]) 03/18/2018  . Acute respiratory failure with hypoxia ([place])  03/18/2018  . CKD (chronic kidney disease) stage 4, GFR 15-29 ml/min (HCC) 03/18/2018  . Acute lower UTI 02/09/2018  . Nausea 02/09/2018  . Constipation 02/09/2018  . Diarrhea 11/25/2011  . Lymphocytic colitis 06/15/2011  . Esophageal dysphagia 06/15/2011  . UNSPECIFIED ANEMIA 04/27/2009  . ACHILLES TENDINITIS 10/27/2008  . PERIPHERAL EDEMA 10/14/2008  . ALLERGIC RHINITIS, SEASONAL 03/26/2008  . RENAL DISEASE, CHRONIC, STAGE III 03/26/2008  . DEGENERATIVE DISC DISEASE 01/15/2008  . Type 2 diabetes mellitus with hyperlipidemia ([place]) 12/13/2007  . HYPERLIPIDEMIA 10/11/2006  . OBESITY NOS 10/11/2006  . ANXIETY 10/11/2006  . Depression 10/11/2006  . GLAUCOMA NOS 10/11/2006  . BENIGN POSITIONAL VERTIGO 10/11/2006  . Essential hypertension 10/11/2006  . GERD 10/11/2006  . OVERACTIVE BLADDER 10/11/2006  . OSTEOPOROSIS 10/11/2006  . BREAST CANCER, HX OF 10/11/2006  . CEREBROVASCULAR ACCIDENT, HX OF 10/11/2006    Past Surgical History:  Procedure Laterality Date  . Bladder tack  2000  . BREAST LUMPECTOMY  2004   right  . cataracts    . CHOLECYSTECTOMY  1960  . COLONOSCOPY  07/2007   lymphocytic colitis  . ESOPHAGOGASTRODUODENOSCOPY  06/24/2011   Procedure: ESOPHAGOGASTRODUODENOSCOPY (EGD);  Surgeon: Dorothyann Peng, MD;  Location[facility];  Service: Endoscopy;  Laterality: N/A;.  Peptic stricture, mild  erosive reflux esophagitis, hiatal  . FLEXIBLE SIGMOIDOSCOPY  11/25/2011   MODERATE DIVERTICULOSIS/INTERNAL HEMORRHOIDS  . S/P Hysterectomy  2000   benign reasons  . SAVORY DILATION  06/24/2011   Procedure: SAVORY DILATION;  Surgeon: Dorothyann Peng, MD;  Location: [facility] ENDO SUITE;  Service: Endoscopy;  Laterality: N/A;     OB History   No obstetric history on file.     Family History  Problem Relation Age of Onset  . Arthritis Mother        died at age 46  . Other Father        died at age 62 - horse accident  . Colon cancer Neg Hx           . Liver disease Neg Hx            . GI problems Neg Hx             Social History   Tobacco Use  . Smoking status: Former Smoker    Packs/day: 1.00    Types: Cigarettes  . Smokeless tobacco: Never Used  . Tobacco comment: 30 yrs ago  Substance Use Topics  . Alcohol use: No  . Drug use: No    Home Medications Prior to Admission medications   Medication Sig Start Date End Date Taking? Authorizing Provider  acetaminophen (TYLENOL) 500 MG tablet Take 500 mg by mouth 3 (three) times daily.     [provider]  amLODipine (NORVASC) 10 MG tablet Take 1 tablet (10 mg total) by mouth daily. Patient taking differently: Take 5 mg by mouth daily.  07/07/18   Dhungel, Flonnie Overman, MD  aspirin 81 MG tablet Take 81 mg by mouth daily.      [provider]  Cholecalciferol (VITAMIN D) 2000 units CAPS Take 1 capsule by mouth daily.    [provider]  clonazePAM (KLONOPIN) 1 MG tablet Take 1 mg by mouth at bedtime. *May take one tablet every 6 hours as needed for anxiety up to three times daily    [provider]  docusate sodium (COLACE) 100 MG capsule Take 100 mg by mouth 2 (two) times daily as needed for mild constipation.    [provider]  ezetimibe (ZETIA) 10 MG tablet Take 1 tablet by mouth daily. 01/24/18   [provider]  fish oil-omega-3 fatty acids 1000 MG capsule Take 1 g by mouth daily.     [provider]  furosemide (LASIX) 20 MG tablet Take 1 tablet by mouth daily. 03/07/18   [provider]  glimepiride (AMARYL) 1 MG tablet Take 1 tablet by mouth daily. 04/24/18   [provider]  HYDROcodone-acetaminophen (NORCO/VICODIN) 5-325 MG tablet Take 1 tablet by mouth every 8 (eight) hours as needed for up to 4 doses for severe pain. 01/28/19   Venter, Margaux, PA-C  latanoprost (XALATAN) 0.005 % ophthalmic solution Place 1 drop into both eyes daily.  06/02/11   [provider]  loperamide (IMODIUM A-D) 2 MG tablet Take 2 mg by mouth 4 (four)  times daily as needed for diarrhea or loose stools.    [provider]  meclizine (ANTIVERT) 25 MG tablet Take 25 mg by mouth 4 (four) times daily as needed for dizziness.    [provider]  methocarbamol (ROBAXIN) 500 MG tablet Take 1 tablet (500 mg total) by mouth every 8 (eight) hours as needed (muscle soreness). 08/07/18   Rolland Porter, MD  omeprazole (PRILOSEC) 40 MG capsule Take 1  capsule by mouth daily.  01/23/18   [provider]  PROAIR HFA 108 (90 Base) MCG/ACT inhaler Take 1-2 puffs by mouth every 6 (six) hours as needed. 03/17/18   [provider]  sitaGLIPtin (JANUVIA) 25 MG tablet Take 1 tablet (25 mg total) by mouth daily. 03/20/18   Orson Eva, MD  timolol (TIMOPTIC) 0.5 % ophthalmic solution Place 1 drop into both eyes every 12 (twelve) hours.  01/17/18   [provider]  vitamin B-12 (CYANOCOBALAMIN) 1000 MCG tablet Take 1 tablet (1,000 mcg total) by mouth daily. 03/20/18   Orson Eva, MD    Allergies    Metformin  Review of Systems   Review of Systems  Unable to perform ROS: Dementia    Physical Exam Updated Vital Signs BP (!) 162/67 (BP Location: Left Arm)   Pulse 63   Temp 98.1 F (36.7 C) (Oral)   Ht 1.575 m (5\' 2" )   Wt 77.6 kg   SpO2 97%   BMI 31.28 kg/m   Physical Exam Vitals and nursing note reviewed.  Constitutional:      General: She is not in acute distress.    Appearance: Normal appearance. She is well-developed.  HENT:     Head: Normocephalic and atraumatic.  Eyes:     Extraocular Movements: Extraocular movements intact.     Conjunctiva/sclera: Conjunctivae normal.     Pupils: Pupils are equal, round, and reactive to light.  Cardiovascular:     Rate and Rhythm: Normal rate and regular rhythm.     Heart sounds: No murmur.  Pulmonary:     Effort: Pulmonary effort is normal. No respiratory distress.     Breath sounds: Normal breath sounds.  Abdominal:     Palpations: Abdomen is soft.     Tenderness:  There is no abdominal tenderness.  Musculoskeletal:        General: Tenderness and deformity present.     Cervical back: Normal range of motion and neck supple.     Comments: Patient's left leg is shortened tenderness to palpation up around the hip area.  It is also rotated.  But good dorsalis pedis pulse 2+.  Good movement of the toes.  Sensation seems to be grossly intact.  Same for the right lower extremity.  The left elbow area has about a 2 to 3 cm skin tear.  No active bleeding.  Skin:    General: Skin is warm and dry.     Capillary Refill: Capillary refill takes less than 2 seconds.  Neurological:     General: No focal deficit present.     Mental Status: She is alert. Mental status is at baseline.     ED Results / Procedures / Treatments   Labs (all labs ordered are listed, but only abnormal results are displayed) Labs Reviewed  COMPREHENSIVE METABOLIC PANEL - Abnormal; Notable for the following components:      Result Value   BUN 39 (*)    Creatinine, Ser 2.27 (*)    GFR calc non Af Amer 18 (*)    GFR calc Af Amer 21 (*)    All other components within normal limits  CBC WITH DIFFERENTIAL/PLATELET - Abnormal; Notable for the following components:   RBC 3.76 (*)    Hemoglobin 10.6 (*)    HCT 34.2 (*)    All other components within normal limits  SARS CORONAVIRUS 2 BY RT PCR (HOSPITAL ORDER, [place] [facility])    EKG None  Radiology  DG Chest 1 View  Result Date: 04/27/2020 CLINICAL DATA:  LEFT hip pain post fall, diabetes mellitus, former smoker EXAM: CHEST  1 VIEW COMPARISON:  06/26/2018 FINDINGS: Normal heart size and pulmonary vascularity. Atherosclerotic calcification and mild tortuosity of thoracic aorta. Lungs clear. No pulmonary infiltrate, pleural effusion or pneumothorax. Bones demineralized. IMPRESSION: No acute abnormalities. Aortic Atherosclerosis (ICD10-I70.0). Electronically Signed   By: Lavonia Dana M.D.   On: 04/27/2020 14:36   CT Head  Wo Contrast  Result Date: 04/27/2020 CLINICAL DATA:  Recent trauma.  Prior infarct EXAM: CT HEAD WITHOUT CONTRAST TECHNIQUE: Contiguous axial images were obtained from the base of the skull through the vertex without intravenous contrast. COMPARISON:  July 04, 2018 FINDINGS: Brain: Diffuse atrophy remains stable. There is no intracranial mass, hemorrhage, extra-axial fluid collection, or midline shift. There is evidence of a prior infarct in the left occipital lobe, stable. There are prior small infarcts in the posterior aspect of each cerebellum, stable. There is small vessel disease throughout the centra semiovale bilaterally, stable. No acute infarct is demonstrable on this study. Vascular: No hyperdense vessel evident. There is calcification in each carotid siphon region as well as in the basilar artery region. Skull: Bony calvarium appears intact. Sinuses/Orbits: There is mucosal thickening and opacification in several ethmoid air cells. Other visualized paranasal sinuses are clear. Orbits appear symmetric bilaterally. Other: Visualized mastoid air cells are clear. IMPRESSION: Stable atrophy with periventricular small vessel disease. Prior focal infarct in the left occipital lobe as well as lacunar type infarcts in each posterior cerebellar hemisphere. No acute infarct evident. No mass or hemorrhage. There are foci of arterial vascular calcification. There is opacification and mucosal thickening in several ethmoid air cells. Electronically Signed   By: Lowella Grip III M.D.   On: 04/27/2020 14:52   DG Hip Unilat W or Wo Pelvis 2-3 Views Left  Result Date: 04/27/2020 CLINICAL DATA:  LEFT hip pain post fall EXAM: DG HIP (WITH OR WITHOUT PELVIS) 2-3V LEFT COMPARISON:  None FINDINGS: Osseous demineralization. Hip and SI joint spaces preserved. Displaced intertrochanteric fracture LEFT femur with varus angulation. No dislocation. Pelvis appears intact. Degenerative disc disease changes at visualized lower  lumbar spine. IMPRESSION: Displaced and angulated intertrochanteric fracture LEFT femur. Electronically Signed   By: Lavonia Dana M.D.   On: 04/27/2020 14:37    Procedures Procedures (including critical care time)  Medications Ordered in ED Medications  0.9 %  sodium chloride infusion ( Intravenous New Bag/Given 04/27/20 1440)  ondansetron (ZOFRAN) injection 4 mg (4 mg Intravenous Given 04/27/20 1438)  HYDROmorphone (DILAUDID) injection 0.5 mg (0.5 mg Intravenous Given 04/27/20 1438)    ED Course  I have reviewed the triage vital signs and the nursing notes.  Pertinent labs & imaging results that were available during my care of the patient were reviewed by me and considered in my medical decision making (see chart for details).    MDM Rules/Calculators/A&P                     Patient with a fall at nursing facility.  X-ray showed left intertrochanter hip fracture.  It is closed.  Contacted Dr. Mardelle Matte on-call for orthopedics.  Also CT to her head because she said she hit her head.  No evidence of any acute injury there is evidence of old stroke she is got a history of old strokes.  Patient's labs also are consistent with chronic renal disease.  Pretty much baseline.  We will do Covid  testing.  Dr. Mardelle Matte orthopedics wants patient admitted to [facility].  Will contact hospitalist service here to do the admission.  He is planning on doing the surgery tomorrow.  Final Clinical Impression(s) / ED Diagnoses Final diagnoses:  Fall, initial encounter  Closed fracture of left hip, initial encounter [facility])    Rx / DC Orders ED Discharge Orders    None       Fredia Sorrow, MD 04/27/20 1510

## 2020-04-28 ENCOUNTER — Inpatient Hospital Stay (HOSPITAL_COMMUNITY): Payer: PPO

## 2020-04-28 ENCOUNTER — Encounter (HOSPITAL_COMMUNITY)
Admission: EM | Disposition: A | Discharge status: Skilled Nursing Facility | Payer: Self-pay | Source: Skilled Nursing Facility | Attending: Internal Medicine

## 2020-04-28 ENCOUNTER — Encounter (HOSPITAL_COMMUNITY): Payer: Self-pay | Admitting: Nephrology

## 2020-04-28 ENCOUNTER — Inpatient Hospital Stay (HOSPITAL_COMMUNITY): Payer: PPO | Admitting: Anesthesiology

## 2020-04-28 DIAGNOSIS — S72002A Fracture of unspecified part of neck of left femur, initial encounter for closed fracture: Secondary | ICD-10-CM

## 2020-04-28 DIAGNOSIS — E1169 Type 2 diabetes mellitus with other specified complication: Secondary | ICD-10-CM

## 2020-04-28 DIAGNOSIS — I10 Essential (primary) hypertension: Secondary | ICD-10-CM

## 2020-04-28 DIAGNOSIS — W19XXXA Unspecified fall, initial encounter: Secondary | ICD-10-CM

## 2020-04-28 DIAGNOSIS — S72142A Displaced intertrochanteric fracture of left femur, initial encounter for closed fracture: Secondary | ICD-10-CM | POA: Diagnosis not present

## 2020-04-28 DIAGNOSIS — E785 Hyperlipidemia, unspecified: Secondary | ICD-10-CM

## 2020-04-28 DIAGNOSIS — G3184 Mild cognitive impairment, so stated: Secondary | ICD-10-CM

## 2020-04-28 DIAGNOSIS — N184 Chronic kidney disease, stage 4 (severe): Secondary | ICD-10-CM

## 2020-04-28 HISTORY — PX: FEMUR IM NAIL: SHX1597

## 2020-04-28 LAB — BASIC METABOLIC PANEL
Anion gap: 11 (ref 5–15)
BUN: 39 mg/dL — ABNORMAL HIGH (ref 8–23)
CO2: 20 mmol/L — ABNORMAL LOW (ref 22–32)
Calcium: 8.7 mg/dL — ABNORMAL LOW (ref 8.9–10.3)
Chloride: 106 mmol/L (ref 98–111)
Creatinine, Ser: 2.09 mg/dL — ABNORMAL HIGH (ref 0.44–1.00)
GFR calc Af Amer: 23 mL/min — ABNORMAL LOW (ref >60)
GFR calc non Af Amer: 20 mL/min — ABNORMAL LOW (ref >60)
Glucose, Bld: 186 mg/dL — ABNORMAL HIGH (ref 70–99)
Potassium: 4.8 mmol/L (ref 3.5–5.1)
Sodium: 137 mmol/L (ref 135–145)

## 2020-04-28 LAB — CBC
HCT: 31.8 % — ABNORMAL LOW (ref 36.0–46.0)
Hemoglobin: 9.9 g/dL — ABNORMAL LOW (ref 12.0–15.0)
MCH: 28.6 pg (ref 26.0–34.0)
MCHC: 31.1 g/dL (ref 30.0–36.0)
MCV: 91.9 fL (ref 80.0–100.0)
Platelets: 174 10*3/uL (ref 150–400)
RBC: 3.46 MIL/uL — ABNORMAL LOW (ref 3.87–5.11)
RDW: 14.2 % (ref 11.5–15.5)
WBC: 16 10*3/uL — ABNORMAL HIGH (ref 4.0–10.5)
nRBC: 0 % (ref 0.0–0.2)

## 2020-04-28 LAB — GLUCOSE, CAPILLARY
Glucose-Capillary: 140 mg/dL — ABNORMAL HIGH (ref 70–99)
Glucose-Capillary: 107 mg/dL — ABNORMAL HIGH (ref 70–99)
Glucose-Capillary: 110 mg/dL — ABNORMAL HIGH (ref 70–99)
Glucose-Capillary: 124 mg/dL — ABNORMAL HIGH (ref 70–99)
Glucose-Capillary: 127 mg/dL — ABNORMAL HIGH (ref 70–99)
Glucose-Capillary: 188 mg/dL — ABNORMAL HIGH (ref 70–99)
Glucose-Capillary: 192 mg/dL — ABNORMAL HIGH (ref 70–99)

## 2020-04-28 LAB — CREATININE, SERUM
Creatinine, Ser: 2.09 mg/dL — ABNORMAL HIGH (ref 0.44–1.00)
GFR calc Af Amer: 23 mL/min — ABNORMAL LOW (ref >60)
GFR calc non Af Amer: 20 mL/min — ABNORMAL LOW (ref >60)

## 2020-04-28 LAB — SURGICAL PCR SCREEN
MRSA, PCR: NEGATIVE
Staphylococcus aureus: NEGATIVE

## 2020-04-28 SURGERY — INSERTION, INTRAMEDULLARY ROD, FEMUR
Anesthesia: General | Site: Hip | Laterality: Left

## 2020-04-28 MED ORDER — LACTATED RINGERS IV SOLN: INTRAVENOUS | Status: DC

## 2020-04-28 MED ORDER — DOCUSATE SODIUM 100 MG PO CAPS
100.0000 mg | ORAL_CAPSULE | Freq: Two times a day (BID) | ORAL | Status: DC
  Administered 2020-04-29 – 2020-04-30 (×3): 100 mg via ORAL
  Filled 2020-04-28 (×4): qty 1

## 2020-04-28 MED ORDER — TRAMADOL HCL 50 MG PO TABS
50.0000 mg | ORAL_TABLET | Freq: Two times a day (BID) | ORAL | Status: DC | PRN
  Administered 2020-04-29: 50 mg via ORAL
  Filled 2020-04-28: qty 1

## 2020-04-28 MED ORDER — FENTANYL CITRATE (PF) 100 MCG/2ML IJ SOLN
INTRAMUSCULAR | Status: AC
  Filled 2020-04-28: qty 2

## 2020-04-28 MED ORDER — ENOXAPARIN SODIUM 30 MG/0.3ML ~~LOC~~ SOLN
30.0000 mg | SUBCUTANEOUS | Status: DC
  Administered 2020-04-29 – 2020-04-30 (×2): 30 mg via SUBCUTANEOUS
  Filled 2020-04-28 (×2): qty 0.3

## 2020-04-28 MED ORDER — ONDANSETRON HCL 4 MG/2ML IJ SOLN: 4.0000 mg | Freq: Four times a day (QID) | INTRAMUSCULAR | Status: DC | PRN

## 2020-04-28 MED ORDER — ONDANSETRON HCL 4 MG/2ML IJ SOLN: 4.0000 mg | Freq: Once | INTRAMUSCULAR | Status: DC | PRN

## 2020-04-28 MED ORDER — ALUM & MAG HYDROXIDE-SIMETH 200-200-20 MG/5ML PO SUSP: 30.0000 mL | ORAL | Status: DC | PRN

## 2020-04-28 MED ORDER — POLYETHYLENE GLYCOL 3350 17 G PO PACK: 17.0000 g | PACK | Freq: Every day | ORAL | Status: DC | PRN

## 2020-04-28 MED ORDER — CHLORHEXIDINE GLUCONATE 0.12 % MT SOLN: 15.0000 mL | OROMUCOSAL | Status: DC

## 2020-04-28 MED ORDER — 0.9 % SODIUM CHLORIDE (POUR BTL) OPTIME
TOPICAL | Status: DC | PRN
  Administered 2020-04-28: 1000 mL

## 2020-04-28 MED ORDER — CEFAZOLIN SODIUM-DEXTROSE 2-4 GM/100ML-% IV SOLN
2.0000 g | Freq: Four times a day (QID) | INTRAVENOUS | Status: AC
  Administered 2020-04-28 – 2020-04-29 (×2): 2 g via INTRAVENOUS
  Filled 2020-04-28 (×2): qty 100

## 2020-04-28 MED ORDER — ACETAMINOPHEN 500 MG PO TABS
500.0000 mg | ORAL_TABLET | Freq: Four times a day (QID) | ORAL | Status: AC
  Administered 2020-04-29 (×3): 500 mg via ORAL
  Filled 2020-04-28 (×4): qty 1

## 2020-04-28 MED ORDER — PHENYLEPHRINE HCL-NACL 10-0.9 MG/250ML-% IV SOLN
INTRAVENOUS | Status: DC | PRN
  Administered 2020-04-28: 40 ug/min via INTRAVENOUS

## 2020-04-28 MED ORDER — MENTHOL 3 MG MT LOZG: 1.0000 | LOZENGE | OROMUCOSAL | Status: DC | PRN

## 2020-04-28 MED ORDER — ACETAMINOPHEN 500 MG PO TABS
1000.0000 mg | ORAL_TABLET | Freq: Once | ORAL | Status: AC
  Administered 2020-04-28: 1000 mg via ORAL
  Filled 2020-04-28: qty 2

## 2020-04-28 MED ORDER — SENNA 8.6 MG PO TABS
1.0000 | ORAL_TABLET | Freq: Two times a day (BID) | ORAL | Status: DC
  Administered 2020-04-29 – 2020-04-30 (×3): 8.6 mg via ORAL
  Filled 2020-04-28 (×4): qty 1

## 2020-04-28 MED ORDER — ACETAMINOPHEN 325 MG PO TABS: 325.0000 mg | ORAL_TABLET | Freq: Four times a day (QID) | ORAL | Status: DC | PRN

## 2020-04-28 MED ORDER — PROPOFOL 10 MG/ML IV BOLUS
INTRAVENOUS | Status: AC
  Filled 2020-04-28: qty 20

## 2020-04-28 MED ORDER — OXYCODONE HCL 5 MG/5ML PO SOLN: 5.0000 mg | Freq: Once | ORAL | Status: DC | PRN

## 2020-04-28 MED ORDER — FERROUS SULFATE 325 (65 FE) MG PO TABS
325.0000 mg | ORAL_TABLET | Freq: Three times a day (TID) | ORAL | Status: DC
  Administered 2020-04-29 – 2020-04-30 (×4): 325 mg via ORAL
  Filled 2020-04-28 (×4): qty 1

## 2020-04-28 MED ORDER — PHENYLEPHRINE 40 MCG/ML (10ML) SYRINGE FOR IV PUSH (FOR BLOOD PRESSURE SUPPORT)
PREFILLED_SYRINGE | INTRAVENOUS | Status: DC | PRN
  Administered 2020-04-28: 80 ug via INTRAVENOUS

## 2020-04-28 MED ORDER — MUPIROCIN 2 % EX OINT
1.0000 "application " | TOPICAL_OINTMENT | Freq: Two times a day (BID) | CUTANEOUS | Status: DC
  Administered 2020-04-29 – 2020-04-30 (×2): 1 via NASAL
  Filled 2020-04-28 (×2): qty 22

## 2020-04-28 MED ORDER — FENTANYL CITRATE (PF) 100 MCG/2ML IJ SOLN
25.0000 ug | INTRAMUSCULAR | Status: DC | PRN
  Administered 2020-04-28 (×2): 25 ug via INTRAVENOUS

## 2020-04-28 MED ORDER — ONDANSETRON HCL 4 MG PO TABS: 4.0000 mg | ORAL_TABLET | Freq: Four times a day (QID) | ORAL | Status: DC | PRN

## 2020-04-28 MED ORDER — PHENOL 1.4 % MT LIQD
1.0000 | OROMUCOSAL | Status: DC | PRN
  Filled 2020-04-28: qty 177

## 2020-04-28 MED ORDER — DEXAMETHASONE SODIUM PHOSPHATE 10 MG/ML IJ SOLN
INTRAMUSCULAR | Status: DC | PRN
  Administered 2020-04-28: 6 mg via INTRAVENOUS

## 2020-04-28 MED ORDER — ENSURE ENLIVE PO LIQD
237.0000 mL | Freq: Two times a day (BID) | ORAL | Status: DC
  Administered 2020-04-29 – 2020-04-30 (×2): 237 mL via ORAL

## 2020-04-28 MED ORDER — ONDANSETRON HCL 4 MG/2ML IJ SOLN
INTRAMUSCULAR | Status: DC | PRN
  Administered 2020-04-28: 4 mg via INTRAVENOUS

## 2020-04-28 MED ORDER — PROPOFOL 10 MG/ML IV BOLUS
INTRAVENOUS | Status: DC | PRN
  Administered 2020-04-28: 70 mg via INTRAVENOUS

## 2020-04-28 MED ORDER — CYCLOSPORINE 0.05 % OP EMUL
1.0000 [drp] | Freq: Two times a day (BID) | OPHTHALMIC | Status: DC
  Administered 2020-04-29 – 2020-04-30 (×3): 1 [drp] via OPHTHALMIC
  Filled 2020-04-28 (×4): qty 1

## 2020-04-28 MED ORDER — MAGNESIUM CITRATE PO SOLN: 1.0000 | Freq: Once | ORAL | Status: DC | PRN

## 2020-04-28 MED ORDER — POTASSIUM CHLORIDE IN NACL 20-0.45 MEQ/L-% IV SOLN
INTRAVENOUS | Status: DC
  Filled 2020-04-28 (×2): qty 1000

## 2020-04-28 MED ORDER — FENTANYL CITRATE (PF) 100 MCG/2ML IJ SOLN
INTRAMUSCULAR | Status: DC | PRN
  Administered 2020-04-28 (×2): 25 ug via INTRAVENOUS

## 2020-04-28 MED ORDER — OXYCODONE HCL 5 MG PO TABS: 5.0000 mg | ORAL_TABLET | Freq: Once | ORAL | Status: DC | PRN

## 2020-04-28 SURGICAL SUPPLY — 43 items
APL SKNCLS STERI-STRIP NONHPOA (GAUZE/BANDAGES/DRESSINGS) ×1
BAG SPEC THK2 15X12 ZIP CLS (MISCELLANEOUS) ×1
BAG ZIPLOCK 12X15 (MISCELLANEOUS) ×3 IMPLANT
BENZOIN TINCTURE PRP APPL 2/3 (GAUZE/BANDAGES/DRESSINGS) ×3 IMPLANT
BIT DRILL CANN LG 4.3MM (BIT) IMPLANT
BLADE HEX COATED 2.75 (ELECTRODE) ×3 IMPLANT
BNDG COHESIVE 4X5 TAN STRL (GAUZE/BANDAGES/DRESSINGS) ×3 IMPLANT
BNDG GAUZE ELAST 4 BULKY (GAUZE/BANDAGES/DRESSINGS) ×3 IMPLANT
CLOSURE WOUND 1/2 X4 (GAUZE/BANDAGES/DRESSINGS) ×1
COVER PERINEAL POST (MISCELLANEOUS) ×3 IMPLANT
COVER SURGICAL LIGHT HANDLE (MISCELLANEOUS) ×3 IMPLANT
DRAPE STERI IOBAN 125X83 (DRAPES) ×3 IMPLANT
DRILL BIT CANN LG 4.3MM (BIT) ×3
DRSG MEPILEX BORDER 4X4 (GAUZE/BANDAGES/DRESSINGS) ×2 IMPLANT
DRSG MEPILEX BORDER 4X8 (GAUZE/BANDAGES/DRESSINGS) ×2 IMPLANT
DURAPREP 26ML APPLICATOR (WOUND CARE) ×3 IMPLANT
ELECT REM PT RETURN 15FT ADLT (MISCELLANEOUS) ×3 IMPLANT
GAUZE SPONGE 4X4 12PLY STRL (GAUZE/BANDAGES/DRESSINGS) ×3 IMPLANT
GAUZE XEROFORM 5X9 LF (GAUZE/BANDAGES/DRESSINGS) ×3 IMPLANT
GLOVE BIOGEL PI IND STRL 8 (GLOVE) ×1 IMPLANT
GLOVE BIOGEL PI INDICATOR 8 (GLOVE) ×4
GLOVE ECLIPSE 7.5 STRL STRAW (GLOVE) ×3 IMPLANT
GLOVE INDICATOR 8.0 STRL GRN (GLOVE) ×5 IMPLANT
GLOVE ORTHO TXT STRL SZ7.5 (GLOVE) ×6 IMPLANT
GLOVE SURG ORTHO 8.0 STRL STRW (GLOVE) ×3 IMPLANT
GOWN STRL REUS W/ TWL XL LVL3 (GOWN DISPOSABLE) IMPLANT
GOWN STRL REUS W/TWL LRG LVL3 (GOWN DISPOSABLE) ×5 IMPLANT
GOWN STRL REUS W/TWL XL LVL3 (GOWN DISPOSABLE) ×3
GUIDEPIN 3.2X17.5 THRD DISP (PIN) ×2 IMPLANT
KIT BASIN (CUSTOM PROCEDURE TRAY) ×3 IMPLANT
KIT TURNOVER KIT A (KITS) ×2 IMPLANT
NAIL HIP FRACT 130D 11X180 (Screw) ×2 IMPLANT
PACK GENERAL/GYN (CUSTOM PROCEDURE TRAY) ×3 IMPLANT
PADDING CAST COTTON 6X4 STRL (CAST SUPPLIES) ×3 IMPLANT
PROTECTOR NERVE ULNAR (MISCELLANEOUS) ×9 IMPLANT
SCREW BONE CORTICAL 5.0X38 (Screw) ×2 IMPLANT
SCREW LAG HIP NAIL 10.5X95 (Screw) ×2 IMPLANT
STRIP CLOSURE SKIN 1/2X4 (GAUZE/BANDAGES/DRESSINGS) ×2 IMPLANT
SUT MNCRL AB 4-0 PS2 18 (SUTURE) ×3 IMPLANT
SUT VIC AB 0 CT1 27 (SUTURE) ×3
SUT VIC AB 0 CT1 27XBRD ANTBC (SUTURE) IMPLANT
SUT VIC AB 3-0 SH 8-18 (SUTURE) ×3 IMPLANT
TOWEL OR 17X26 10 PK STRL BLUE (TOWEL DISPOSABLE) ×6 IMPLANT

## 2020-04-28 NOTE — Discharge Instructions (Signed)

## 2020-04-28 NOTE — Anesthesia Postprocedure Evaluation (Signed)
Anesthesia Post Note  Patient: ANABIA WEATHERWAX  Procedure(s) Performed: INTRAMEDULLARY (IM) NAIL FEMORAL (Left Hip)     Patient location during evaluation: PACU Anesthesia Type: General Level of consciousness: awake and alert Pain management: pain level controlled Vital Signs Assessment: post-procedure vital signs reviewed and stable Respiratory status: spontaneous breathing, nonlabored ventilation, respiratory function stable and patient connected to nasal cannula oxygen Cardiovascular status: blood pressure returned to baseline and stable Postop Assessment: no apparent nausea or vomiting Anesthetic complications: no    Last Vitals:  Vitals:   04/28/20 2020 04/28/20 2036  BP: 123/64 (!) 153/67  Pulse: 70 69  Resp: 10 12  Temp:    SpO2: 95% 99%    Last Pain:  Vitals:   04/28/20 2036  TempSrc:   PainSc: 0-No pain                 Audry Pili

## 2020-04-28 NOTE — Progress Notes (Signed)
Pt off the floor with daughter and two OR staff members, via hospital bed. Alert, no acute distress noted.

## 2020-04-28 NOTE — Anesthesia Preprocedure Evaluation (Addendum)
Anesthesia Evaluation  Patient identified by MRN, date of birth, ID band Patient confused    Reviewed: Allergy & Precautions, NPO status , Patient's Chart, lab work & pertinent test results  History of Anesthesia Complications Negative for: history of anesthetic complications  Airway Mallampati: III  TM Distance: >3 FB Neck ROM: Full    Dental  (+) Dental Advisory Given, Poor Dentition, Chipped   Pulmonary former smoker,    Pulmonary exam normal        Cardiovascular hypertension, Pt. on medications + Valvular Problems/Murmurs AS  Rhythm:Regular Rate:Normal + Systolic murmurs  '19 TTE - Mild LVH. EF 60% to 65%. Grade 2 diastolic dysfunction. Mild-moderate AS, mild AI. Mean gradient (S): 11 mm Hg. Peak gradient (S): 23 mm Hg. VTI ratio of LVOT to aortic valve: 0.34. Valve area (VTI): 1.07 cm^2. The ascending aorta was mildly ectatic. Mild MR. Trivial TR. PA peak pressure: 32 mm Hg.     Neuro/Psych PSYCHIATRIC DISORDERS Anxiety Depression  Cognitive impairment  BPPV  CVA    GI/Hepatic Neg liver ROS, GERD  Medicated and Controlled,  Endo/Other  diabetes, Type 2, Oral Hypoglycemic Agents Obesity   Renal/GU CRFRenal disease     Musculoskeletal  (+) Arthritis ,   Abdominal   Peds  Hematology  (+) anemia ,   Anesthesia Other Findings Covid neg 6/7  Reproductive/Obstetrics                           Anesthesia Physical Anesthesia Plan  ASA: III  Anesthesia Plan: General   Post-op Pain Management:    Induction: Intravenous  PONV Risk Score and Plan: 3 and Treatment may vary due to age or medical condition, Ondansetron and Propofol infusion  Airway Management Planned: LMA  Additional Equipment: None  Intra-op Plan:   Post-operative Plan: Extubation in OR  Informed Consent: I have reviewed the patients History and Physical, chart, labs and discussed the procedure including the  risks, benefits and alternatives for the proposed anesthesia with the patient or authorized representative who has indicated his/her understanding and acceptance.     Dental advisory given  Plan Discussed with: CRNA and Anesthesiologist  Anesthesia Plan Comments:        Anesthesia Quick Evaluation

## 2020-04-28 NOTE — Progress Notes (Signed)
2 rings & 1 watch removed from pt and in a specimen bottle to give to pts dtr Pam

## 2020-04-28 NOTE — Consult Note (Signed)
ORTHOPAEDIC CONSULTATION  REQUESTING PHYSICIAN: Flora Lipps, MD  Chief Complaint: left hip pain  HPI: Nancy Medina is a 86 y.o. female who complains of  Left hip pain after a fall from the nursing home.  Pain worse with movement, better with rest and pain medications.  She has multiple medical problems.  [facility] from [facility].  Patient walks with a walker at baseline.    Past Medical History:  Diagnosis Date  . Achilles tendinitis   . Allergic rhinitis   . Anemia of chronic disease    at least back to 2010  . Anxiety   . Benign paroxysmal positional vertigo   . Cataract   . Cerebrovascular accident ([place])   . Chronic kidney disease (CKD), stage III (moderate)   . Depression   . DJD (degenerative joint disease)   . DM (diabetes mellitus) ([place])    type II  . GERD (gastroesophageal reflux disease)   . HTN (hypertension)   . HX: breast cancer   . Hyperlipidemia   . Hypertonicity of bladder   . Knee pain, left   . Leg pain   . Lymphocytic colitis 2008   treated with Lialda short-term  . Memory loss   . Obesity   . Osteoporosis, unspecified   . Renal disease    stage III  . Unspecified fall   . Unspecified glaucoma(365.9)    Past Surgical History:  Procedure Laterality Date  . Bladder tack  2000  . BREAST LUMPECTOMY  2004   right  . cataracts    . CHOLECYSTECTOMY  1960  . COLONOSCOPY  07/2007   lymphocytic colitis  . ESOPHAGOGASTRODUODENOSCOPY  06/24/2011   Procedure: ESOPHAGOGASTRODUODENOSCOPY (EGD);  Surgeon: Dorothyann Peng, MD;  [facility];  Service: Endoscopy;  Laterality: N/A;.  Peptic stricture, mild erosive reflux esophagitis, hiatal  . FLEXIBLE SIGMOIDOSCOPY  11/25/2011   MODERATE DIVERTICULOSIS/INTERNAL HEMORRHOIDS  . S/P Hysterectomy  2000   benign reasons  . SAVORY DILATION  06/24/2011   Procedure: SAVORY DILATION;  Surgeon: Dorothyann Peng, MD;  [facility];  Service: Endoscopy;  Laterality: N/A;   Social History    Socioeconomic History  . Marital status: Widowed    Spouse name: Not on file  . Number of children: 2  . Years of education: 52  . Highest education level: High school graduate  Occupational History  . Occupation: retired    Fish farm manager: RETIRED    Comment: [employer]  Tobacco Use  . Smoking status: Former Smoker    Packs/day: 1.00    Types: Cigarettes  . Smokeless tobacco: Never Used  . Tobacco comment: 30 yrs ago  Substance and Sexual Activity  . Alcohol use: No  . Drug use: No  . Sexual activity: Not on file  Other Topics Concern  . Not on file  Social History Narrative   Lives at [facility] in [place].   Right-handed.   2-3 cups caffeine per day.   Social Determinants of Health   Financial Resource Strain:   . Difficulty of Paying Living Expenses:   Food Insecurity:   . Worried About Charity fundraiser in the Last Year:   . Arboriculturist in the Last Year:   Transportation Needs:   . Film/video editor (Medical):   Marland Kitchen Lack of Transportation (Non-Medical):   Physical Activity:   . Days of Exercise per Week:   . Minutes of Exercise per Session:   Stress:   .  Feeling of Stress :   Social Connections:   . Frequency of Communication with Friends and Family:   . Frequency of Social Gatherings with Friends and Family:   . Attends Religious Services:   . Active Member of Clubs or Organizations:   . Attends Archivist Meetings:   Marland Kitchen Marital Status:    Family History  Problem Relation Age of Onset  . Arthritis Mother        died at age 69  . Other Father        died at age 45 - horse accident  . Colon cancer Neg Hx           . Liver disease Neg Hx           . GI problems Neg Hx            Allergies  Allergen Reactions  . Metformin     This allergy is not listed under records with [place] facility. Past records states that patient "felt poor" as a reaction to taking this medication     Positive ROS:  All other systems have been reviewed and were otherwise negative with the exception of those mentioned in the HPI and as above.  Physical Exam: General: Alert, no acute distress Cardiovascular: No pedal edema Respiratory: No cyanosis, no use of accessory musculature GI: No organomegaly, abdomen is soft and non-tender Skin: No lesions in the area of chief complaint Neurologic: Sensation intact distally Psychiatric: Patient is competent for consent with normal mood and affect Lymphatic: No axillary or cervical lymphadenopathy  MUSCULOSKELETAL: left hip with positive log roll.  Mild bruising.  Positive pain to palpation.    Assessment: Principal Problem:   Closed left hip fracture, initial encounter [facility]) Active Problems:   Type 2 diabetes mellitus with hyperlipidemia (HCC)   Essential hypertension   CKD (chronic kidney disease) stage 4, GFR 15-29 ml/min (HCC)   Fall   Mild cognitive impairment   Closed left hip fracture (HCC)   Plan: Left hip intramedullary fixation.   The risks benefits and alternatives were discussed with the patient including but not limited to the risks of nonoperative treatment, versus surgical intervention including infection, bleeding, nerve injury, malunion, nonunion, AVN, the need for revision surgery, arthroplasty, hardware prominence, hardware failure, the need for hardware removal, blood clots, cardiopulmonary complications, morbidity, mortality, among others, and they were willing to proceed.       Johnny Bridge, MD Cell [phone]   04/28/2020 5:19 PM

## 2020-04-28 NOTE — Progress Notes (Signed)
Patient came back from surgery alert, on 3L O2, bruise to left AC area and both hands, deny pain at this time. Patient is on low bed, VS taken and documented. SCD's on.

## 2020-04-28 NOTE — Progress Notes (Signed)
Initial Nutrition Assessment  INTERVENTION:   -Diet advancement per MD -Recommend Ensure supplements once diet is advanced  NUTRITION DIAGNOSIS:   Increased nutrient needs related to hip fracture, post-op healing as evidenced by estimated needs.  GOAL:   Patient will meet greater than or equal to 90% of their needs  MONITOR:   Diet advancement, Labs, Weight trends, I & O's  REASON FOR ASSESSMENT:   Consult Hip fracture protocol  ASSESSMENT:   86 y.o. year-old female with past medical history of stroke, CKD stage III, history of breast cancer, diabetes mellitus type 2,  memory loss presented from the nursing home status post fall with complaints of left hip pain at [facility] emergency department.[place] done in the ED showed a left intertrochanteric fracture.  Patient currently NPO, awaiting surgery for hip fracture.  Pt with history of memory loss, unable to provide much history.  Once diet is advanced, would likely benefit from nutritional supplements. Will order Ensure.  Per weight records, pt's weight has increased since 2019.  Medications: Vitamin D, Lovaza, vitamin B12 Labs reviewed: CBGs: 107-140  NUTRITION - FOCUSED PHYSICAL EXAM:  Depletions from advanced age.  Diet Order:   Diet Order            Diet NPO time specified  Diet effective now              EDUCATION NEEDS:   No education needs have been identified at this time  Skin:  Skin Assessment: Reviewed RN Assessment  Last BM:  6/7  Height:   Ht Readings from Last 1 Encounters:  04/27/20 5\' 2"  (1.575 m)    Weight:   Wt Readings from Last 1 Encounters:  04/27/20 77.6 kg   BMI:  Body mass index is 31.28 kg/m.  Estimated Nutritional Needs:   Kcal:  1500-1700  Protein:  60-75g  Fluid:  1.7L/day   Clayton Bibles, MS, RD, LDN Inpatient Clinical Dietitian Contact information available via Amion

## 2020-04-28 NOTE — Anesthesia Procedure Notes (Signed)
Procedure Name: LMA Insertion Date/Time: 04/28/2020 5:58 PM Performed by: Anne Fu, CRNA Pre-anesthesia Checklist: Patient identified, Emergency Drugs available, Suction available, Patient being monitored and Timeout performed Patient Re-evaluated:Patient Re-evaluated prior to induction Oxygen Delivery Method: Circle system utilized Preoxygenation: Pre-oxygenation with 100% oxygen Induction Type: IV induction Ventilation: Mask ventilation without difficulty LMA: LMA inserted LMA Size: 4.0 Number of attempts: 1 Placement Confirmation: positive ETCO2 and breath sounds checked- equal and bilateral Tube secured with: Tape

## 2020-04-28 NOTE — Transfer of Care (Signed)
Immediate Anesthesia Transfer of Care Note  Patient: Nancy Medina  Procedure(s) Performed: Procedure(s): INTRAMEDULLARY (IM) NAIL FEMORAL (Left)  Patient Location: PACU  Anesthesia Type:General  Level of Consciousness:  sedated, patient cooperative and responds to stimulation  Airway & Oxygen Therapy:Patient Spontanous Breathing and Patient connected to face mask oxgen  Post-op Assessment:  Report given to PACU RN and Post -op Vital signs reviewed and stable  Post vital signs:  Reviewed and stable  Last Vitals:  Vitals:   04/28/20 1419 04/28/20 1713  BP: (!) 164/79 (!) 166/72  Pulse: 69 77  Resp: 16 18  Temp: 36.9 C   SpO2: 04% 54%    Complications: No apparent anesthesia complications

## 2020-04-28 NOTE — Plan of Care (Signed)
Pt admitted as a result of a fall obtaining a L hip fx. Pt will progress according to care plan

## 2020-04-28 NOTE — Progress Notes (Signed)
PROGRESS NOTE  Nancy Medina PZW:[id number] DOB: [birth year] DOA: 04/27/2020 PCP: Lemmie Evens, MD   LOS: 1 day   Brief narrative: As per HPI,  Patient is a 86 y.o. year-old female with past medical history of stroke, CKD stage III, history of breast cancer, diabetes mellitus type 2,  memory loss presented from the nursing home status post fall with complaints of left hip pain at [facility] emergency department.[place] done in the ED showed a left intertrochanteric fracture.  Ortho was consulted and the patient was considered for admission to the hospital at [facility]..  At baseline patient ambulates with the help of a walker.  Assessment/Plan:  Principal Problem:   Closed left hip fracture, initial encounter [facility]) Active Problems:   Type 2 diabetes mellitus with hyperlipidemia (HCC)   Essential hypertension   CKD (chronic kidney disease) stage 4, GFR 15-29 ml/min (HCC)   Fall   Mild cognitive impairment   Closed left hip fracture (HCC)  Status post fall with the left intertrochanteric hip fracture.   Currently n.p.o. for orthopedic intervention likely IM nailing.  Continue IV fluids analgesia.  Head scan was negative for acute findings except for old stroke.  EKG on presentation  showed normal sinus rhythm.  CKD III -last known creatinine of 2.0 in March 2020.  Creatinine on presentation 2.2.  Creatinine today at baseline at 2.0.  Will closely monitor.   Diabetes mellitus type II.  On oral agents at home.  Continue sliding scale insulin for now.  Closely monitor blood glucose levels.  Essential hypertension.  Continue Norvasc.  Hold Lasix for now.  Monitor blood pressure.  History of CVA.  CT scan showing previous CVA.  VTE Prophylaxis: Received one dose of heparin subcu yesterday.  Code Status: Full code  Family Communication: I was unable to reach the patient's daughter Ms. Mickie Kay on the home and cell phone listed.  Status is: Inpatient  Remains inpatient  appropriate because:Ongoing active pain requiring inpatient pain management, Unsafe d/c plan, Inpatient level of care appropriate due to severity of illness and Hip fracture needing surgical repair   Dispo: The patient is from: SNF              Anticipated d/c is to: SNF              Anticipated d/c date is: 3 days              Patient currently is not medically stable to d/c.   Consultants:  Orthopedics  Procedures:  None so far  Antibiotics:  . Preoperative cefazolin  Anti-infectives (From admission, onward)   Start     Dose/Rate Route Frequency Ordered Stop   04/28/20 1600  ceFAZolin (ANCEF) IVPB 2g/100 mL premix     2 g 200 mL/hr over 30 Minutes Intravenous On call to O.R. 04/27/20 2141 04/29/20 0559     Subjective:  Today, patient was seen and examined at bedside.  Patient complains of hip and back pain.  Poor historian.  Denies shortness of breath or chest pain.  Objective: Vitals:   04/28/20 0527 04/28/20 0921  BP: (!) 173/88 (!) 182/71  Pulse: 70 72  Resp: 17 16  Temp: 97.7 F (36.5 C) 98.7 F (37.1 C)  SpO2: 92% 95%    Intake/Output Summary (Last 24 hours) at 04/28/2020 1413 Last data filed at 04/28/2020 0527 Gross per 24 hour  Intake 120 ml  Output 300 ml  Net -180 ml   Filed  Weights   04/27/20 1111  Weight: 77.6 kg   Body mass index is 31.28 kg/m.   Physical Exam: GENERAL: Patient is alert awake and communicative, poor historian.  Disoriented not in obvious distress.  Elderly female. HENT: No scleral pallor or icterus. Pupils equally reactive to light. Oral mucosa is moist NECK: is supple, no gross swelling noted. CHEST: Clear to auscultation. No crackles or wheezes.  Diminished breath sounds bilaterally. CVS: S1 and S2 heard, no murmur. Regular rate and rhythm.  ABDOMEN: Soft, non-tender, bowel sounds are present. EXTREMITIES: No edema.  Left lower extremity externally rotated with shortening.  Tenderness over the left hip.  Left elbow with skin  tear CNS: Cranial nerves are intact.  Moves all extremities.  Mild generalized weakness. SKIN: warm and dry.  Data Review: I have personally reviewed the following laboratory data and studies,  CBC: Recent Labs  Lab 04/27/20 1130  WBC 7.1  NEUTROABS 5.0  HGB 10.6*  HCT 34.2*  MCV 91.0  PLT 431   Basic Metabolic Panel: Recent Labs  Lab 04/27/20 1130 04/28/20 0501  NA 138 137  K 4.3 4.8  CL 103 106  CO2 23 20*  GLUCOSE 89 186*  BUN 39* 39*  CREATININE 2.27* 2.09*  CALCIUM 8.9 8.7*   Liver Function Tests: Recent Labs  Lab 04/27/20 1130  AST 16  ALT 11  ALKPHOS 67  BILITOT 0.3  PROT 7.0  ALBUMIN 3.8   No results for input(s): LIPASE, AMYLASE in the last 168 hours. No results for input(s): AMMONIA in the last 168 hours. Cardiac Enzymes: No results for input(s): CKTOTAL, CKMB, CKMBINDEX, TROPONINI in the last 168 hours. BNP (last 3 results) No results for input(s): BNP in the last 8760 hours.  ProBNP (last 3 results) No results for input(s): PROBNP in the last 8760 hours.  CBG: Recent Labs  Lab 04/27/20 2030 04/28/20 0736 04/28/20 1142  GLUCAP 190* 140* 107*   Recent Results (from the past 240 hour(s))  Culture, Urine     Status: Abnormal   Collection Time: 04/18/20  6:38 PM   Specimen: Urine, Clean Catch  Result Value Ref Range Status   Specimen Description   Final    URINE, CLEAN CATCH Performed at [facility], [address] [place], [place] [address]    Special Requests   Final    NONE Performed at [facility], [address], [place], [place] [address]    Culture (A)  Final    >=100,000 COLONIES/mL ESCHERICHIA COLI >=100,000 COLONIES/mL DIPHTHEROIDS(CORYNEBACTERIUM SPECIES) Standardized susceptibility testing for this organism is not available. Performed at [facility], [place] [address], [place],  [address]    Report Status 04/24/2020 FINAL  Final   Organism ID, Bacteria ESCHERICHIA COLI (A)  Final      Susceptibility    Escherichia coli - MIC*    AMPICILLIN 8 SENSITIVE Sensitive     CEFAZOLIN <=4 SENSITIVE Sensitive     CEFTRIAXONE <=1 SENSITIVE Sensitive     CIPROFLOXACIN >=4 RESISTANT Resistant     GENTAMICIN <=1 SENSITIVE Sensitive     IMIPENEM <=0.25 SENSITIVE Sensitive     NITROFURANTOIN <=16 SENSITIVE Sensitive     TRIMETH/SULFA <=20 SENSITIVE Sensitive     AMPICILLIN/SULBACTAM 4 SENSITIVE Sensitive     PIP/TAZO <=4 SENSITIVE Sensitive     * >=100,000 COLONIES/mL ESCHERICHIA COLI  SARS Coronavirus 2 by RT PCR (hospital order, performed in [facility] hospital lab) Nasopharyngeal Nasopharyngeal Swab     Status: None  Collection Time: 04/27/20  2:58 PM   Specimen: Nasopharyngeal Swab  Result Value Ref Range Status   SARS Coronavirus 2 NEGATIVE NEGATIVE Final    Comment: (NOTE) SARS-CoV-2 target nucleic acids are NOT DETECTED. The SARS-CoV-2 RNA is generally detectable in upper and lower respiratory specimens during the acute phase of infection. The lowest concentration of SARS-CoV-2 viral copies this assay can detect is 250 copies / mL. A negative result does not preclude SARS-CoV-2 infection and should not be used as the sole basis for treatment or other patient management decisions.  A negative result may occur with improper specimen collection / handling, submission of specimen other than nasopharyngeal swab, presence of viral mutation(s) within the areas targeted by this assay, and inadequate number of viral copies (<250 copies / mL). A negative result must be combined with clinical observations, patient history, and epidemiological information. Fact Sheet for Patients:   StrictlyIdeas.no Fact Sheet for Healthcare Providers: BankingDealers.co.za This test is not yet approved or cleared  by the [place] FDA and has been authorized for detection and/or diagnosis of SARS-CoV-2 by FDA under an Emergency Use Authorization (EUA).  This EUA  will remain in effect (meaning this test can be used) for the duration of the COVID-19 declaration under Section 564(b)(1) of the Act, 21 U.S.C. section 360bbb-3(b)(1), unless the authorization is terminated or revoked sooner. Performed at [facility], [address], [place], [place] [address]   Surgical PCR screen     Status: None   Collection Time: 04/28/20  5:58 AM   Specimen: Nasal Mucosa; Nasal Swab  Result Value Ref Range Status   MRSA, PCR NEGATIVE NEGATIVE Final   Staphylococcus aureus NEGATIVE NEGATIVE Final    Comment: (NOTE) The Xpert SA Assay (FDA approved for NASAL specimens in patients 9 years of age and older), is one component of a comprehensive surveillance program. It is not intended to diagnose infection nor to guide or monitor treatment. Performed at [facility], [place] [address], [place], [place] [address]      Studies: DG Chest 1 View  Result Date: 04/27/2020 CLINICAL DATA:  LEFT hip pain post fall, diabetes mellitus, former smoker EXAM: CHEST  1 VIEW COMPARISON:  06/26/2018 FINDINGS: Normal heart size and pulmonary vascularity. Atherosclerotic calcification and mild tortuosity of thoracic aorta. Lungs clear. No pulmonary infiltrate, pleural effusion or pneumothorax. Bones demineralized. IMPRESSION: No acute abnormalities. Aortic Atherosclerosis (ICD10-I70.0). Electronically Signed   By: Lavonia Dana M.D.   On: 04/27/2020 14:36   CT Head Wo Contrast  Result Date: 04/27/2020 CLINICAL DATA:  Recent trauma.  Prior infarct EXAM: CT HEAD WITHOUT CONTRAST TECHNIQUE: Contiguous axial images were obtained from the base of the skull through the vertex without intravenous contrast. COMPARISON:  July 04, 2018 FINDINGS: Brain: Diffuse atrophy remains stable. There is no intracranial mass, hemorrhage, extra-axial fluid collection, or midline shift. There is evidence of a prior infarct in the left occipital lobe, stable. There are prior small infarcts in the  posterior aspect of each cerebellum, stable. There is small vessel disease throughout the centra semiovale bilaterally, stable. No acute infarct is demonstrable on this study. Vascular: No hyperdense vessel evident. There is calcification in each carotid siphon region as well as in the basilar artery region. Skull: Bony calvarium appears intact. Sinuses/Orbits: There is mucosal thickening and opacification in several ethmoid air cells. Other visualized paranasal sinuses are clear. Orbits appear symmetric bilaterally. Other: Visualized mastoid air cells are clear. IMPRESSION: Stable atrophy with periventricular small vessel disease.  Prior focal infarct in the left occipital lobe as well as lacunar type infarcts in each posterior cerebellar hemisphere. No acute infarct evident. No mass or hemorrhage. There are foci of arterial vascular calcification. There is opacification and mucosal thickening in several ethmoid air cells. Electronically Signed   By: Lowella Grip III M.D.   On: 04/27/2020 14:52   DG Hip Unilat W or Wo Pelvis 2-3 Views Left  Result Date: 04/27/2020 CLINICAL DATA:  LEFT hip pain post fall EXAM: DG HIP (WITH OR WITHOUT PELVIS) 2-3V LEFT COMPARISON:  None FINDINGS: Osseous demineralization. Hip and SI joint spaces preserved. Displaced intertrochanteric fracture LEFT femur with varus angulation. No dislocation. Pelvis appears intact. Degenerative disc disease changes at visualized lower lumbar spine. IMPRESSION: Displaced and angulated intertrochanteric fracture LEFT femur. Electronically Signed   By: Lavonia Dana M.D.   On: 04/27/2020 14:37      Flora Lipps, MD  [facility] 04/28/2020

## 2020-04-28 NOTE — Op Note (Signed)
DATE OF SURGERY:  04/28/2020  TIME: 7:03 PM  PATIENT NAME:  Nancy Medina  AGE: 86 y.o.  PRE-OPERATIVE DIAGNOSIS:  left intertrochanteric hip fracture  POST-OPERATIVE DIAGNOSIS:  SAME  PROCEDURE:  INTRAMEDULLARY (IM) NAIL FEMORAL  SURGEON:  Johnny Bridge  ASSISTANT:  Merlene Pulling, PA-C, present and scrubbed throughout the case, critical for assistance with exposure, retraction, instrumentation, and closure.  OPERATIVE IMPLANTS: Biomet Affixus size 180 x 11 femoral nail with a cephalomedullary lag screw for the femoral head, and a size 38 distal interlocking bolt.  UNIQUE ASPECTS OF THE CASE:  Poor bone quality  ESTIMATED BLOOD LOSS: 75 ml  PREOPERATIVE INDICATIONS:  [name] is a 86 y.o. year old who fell and suffered a hip fracture. She was brought into the ER and then admitted and optimized and then elected for surgical intervention.    The risks benefits and alternatives were discussed with the patient including but not limited to the risks of nonoperative treatment, versus surgical intervention including infection, bleeding, nerve injury, malunion, nonunion, hardware prominence, hardware failure, need for hardware removal, blood clots, cardiopulmonary complications, morbidity, mortality, among others, and they were willing to proceed.    OPERATIVE PROCEDURE:  The patient was brought to the operating room and placed in the supine position. Anesthesia was administered. She was placed on the fracture table.  Closed reduction was performed under C-arm guidance.  Time out was then performed after sterile prep and drape. She received preoperative antibiotics.  Incision was made proximal to the greater trochanter. A guidewire was placed in the appropriate position. Confirmation was made on AP and lateral views.  The above-named nail was opened. I opened the proximal femur with a reamer. I then placed the nail by hand down. I did not need to ream the femur.  Once the nail was  completely seated, I placed a guidepin into the femoral head into the center center position. I measured the length, and then reamed the lateral cortex and up into the head. I then placed the cephalomedullary screw. Slight compression was applied. Anatomic fixation achieved. Bone quality was mediocre.  I then secured the proximal interlocking bolt, and locked the nail distally using the jig.  I took final C-arm pictures AP and lateral.   Anatomic reconstruction was achieved, and the wounds were irrigated copiously and closed with Vicryl followed by Steri-Strips and sterile gauze for the skin. The patient was awakened and returned to PACU in stable and satisfactory condition. There were no complications and the patient tolerated the procedure well.  She will be weightbearing as tolerated, and will be on Lovenox  for a period of four weeks after discharge.   Marchia Bond, M.D.

## 2020-04-29 ENCOUNTER — Encounter: Payer: Self-pay | Admitting: *Deleted

## 2020-04-29 LAB — BASIC METABOLIC PANEL
Anion gap: 14 (ref 5–15)
BUN: 49 mg/dL — ABNORMAL HIGH (ref 8–23)
CO2: 18 mmol/L — ABNORMAL LOW (ref 22–32)
Calcium: 8.4 mg/dL — ABNORMAL LOW (ref 8.9–10.3)
Chloride: 103 mmol/L (ref 98–111)
Creatinine, Ser: 2.23 mg/dL — ABNORMAL HIGH (ref 0.44–1.00)
GFR calc Af Amer: 21 mL/min — ABNORMAL LOW (ref >60)
GFR calc non Af Amer: 19 mL/min — ABNORMAL LOW (ref >60)
Glucose, Bld: 252 mg/dL — ABNORMAL HIGH (ref 70–99)
Potassium: 5.6 mmol/L — ABNORMAL HIGH (ref 3.5–5.1)
Sodium: 135 mmol/L (ref 135–145)

## 2020-04-29 LAB — CBC
HCT: 28 % — ABNORMAL LOW (ref 36.0–46.0)
Hemoglobin: 8.5 g/dL — ABNORMAL LOW (ref 12.0–15.0)
MCH: 28.1 pg (ref 26.0–34.0)
MCHC: 30.4 g/dL (ref 30.0–36.0)
MCV: 92.4 fL (ref 80.0–100.0)
Platelets: 164 10*3/uL (ref 150–400)
RBC: 3.03 MIL/uL — ABNORMAL LOW (ref 3.87–5.11)
RDW: 14.2 % (ref 11.5–15.5)
WBC: 7.7 10*3/uL (ref 4.0–10.5)
nRBC: 0 % (ref 0.0–0.2)

## 2020-04-29 LAB — GLUCOSE, CAPILLARY
Glucose-Capillary: 232 mg/dL — ABNORMAL HIGH (ref 70–99)
Glucose-Capillary: 326 mg/dL — ABNORMAL HIGH (ref 70–99)
Glucose-Capillary: 343 mg/dL — ABNORMAL HIGH (ref 70–99)
Glucose-Capillary: 238 mg/dL — ABNORMAL HIGH (ref 70–99)

## 2020-04-29 MED ORDER — SODIUM CHLORIDE 0.9 % IV SOLN: INTRAVENOUS | Status: AC

## 2020-04-29 NOTE — Progress Notes (Addendum)
PROGRESS NOTE  SRISHTI STRNAD NAT:[id number] DOB: 1925/09/11 DOA: 04/27/2020 PCP: Lemmie Evens, MD   LOS: 2 days   Brief narrative: As per HPI,  Patient is a 86 y.o. year-old female with past medical history of stroke, CKD stage III, history of breast cancer, diabetes mellitus type 2,  memory loss presented from the nursing home status post fall with complaints of left hip pain at [facility] emergency department.[place] done in the ED showed a left intertrochanteric fracture.  Ortho was consulted and the patient was considered for admission to the hospital at [facility]..  At baseline, patient ambulates with the help of a walker.  Assessment/Plan:  Principal Problem:   Closed left hip fracture, initial encounter [facility]) Active Problems:   Type 2 diabetes mellitus with hyperlipidemia (HCC)   Essential hypertension   CKD (chronic kidney disease) stage 4, GFR 15-29 ml/min (HCC)   Fall   Mild cognitive impairment   Closed left hip fracture (HCC)  Status post fall with the left intertrochanteric hip fracture.   Seen by orthopedics and underwent IM nailing on 04/28/2020.  Continue  analgesia.  Pending physical therapy evaluation.  Head scan was negative for acute findings except for old stroke.  EKG on presentation  showed normal sinus rhythm.  Patient will be weightbearing as tolerated.  She will need Lovenox on discharge for DVT prophylaxis as per orthopedics..  CKD IIIb -last known creatinine of 2.0 in March 2020.  Creatinine on presentation 2.2.   Will closely monitor with BMP.  Creatinine today at 2.2.   Diabetes mellitus type II.  On oral agents at home.  Continue sliding scale insulin for now.  Closely monitor blood glucose levels.  Latest POC glucose of 127.   Essential hypertension.  Continue Norvasc.  Hold Lasix for now.  Monitor blood pressure closely.  Blood pressure seems to be reasonably controlled.  History of CVA.  CT head scan showing previous CVA.  Will need physical  therapy evaluation.  VTE Prophylaxis: Lovenox subcu  Code Status: Full code  Family Communication:  I reached the patient's daughter Ms Mickie Kay on the phone and updated her about the clinical condition.  Status is: Inpatient  Remains inpatient appropriate because:Ongoing active pain requiring inpatient pain management, Unsafe d/c plan, Inpatient level of care appropriate due to severity of illness, status post hip fracture, physical therapy pending.   Dispo: The patient is from: Assisted living facility              Anticipated d/c is to: SNF              Anticipated d/c date is: 1-2 days              Patient currently is not medically stable to d/c.  Await orthopedic evaluation today.  PT evaluation pending   consultants:  Orthopedics  Procedures:  Femoral intramedullary nail placement on the left on 04/27/2020.  Antibiotics:  . Preoperative cefazolin  Anti-infectives (From admission, onward)   Start     Dose/Rate Route Frequency Ordered Stop   04/29/20 0000  ceFAZolin (ANCEF) IVPB 2g/100 mL premix     2 g 200 mL/hr over 30 Minutes Intravenous Every 6 hours 04/28/20 2032 04/29/20 0541   04/28/20 1600  ceFAZolin (ANCEF) IVPB 2g/100 mL premix     2 g 200 mL/hr over 30 Minutes Intravenous On call to O.R. 04/27/20 2141 04/28/20 1801     Subjective: Today, patient was seen and examined at bedside.  Complains of mild hip pain and ankle pain. Appears  confused.  Denies shortness of breath or chest pain.  Objective: Vitals:   04/29/20 0145 04/29/20 0500  BP:  (!) 150/84  Pulse:  67  Resp:  18  Temp:  98.4 F (36.9 C)  SpO2: 96%     Intake/Output Summary (Last 24 hours) at 04/29/2020 0734 Last data filed at 04/29/2020 0511 Gross per 24 hour  Intake 1445.29 ml  Output 550 ml  Net 895.29 ml   Filed Weights   04/27/20 1111 04/28/20 1713  Weight: 77.6 kg 77.6 kg   Body mass index is 31.29 kg/m.   Physical Exam: GENERAL: Patient is alert awake and communicative  but disoriented, confused not in obvious distress.  Elderly female.  Obese HENT: No scleral pallor or icterus. Pupils equally reactive to light. Oral mucosa is moist NECK: is supple, no gross swelling noted. CHEST: Clear to auscultation. No crackles or wheezes.  Diminished breath sounds bilaterally. CVS: S1 and S2 heard, no murmur. Regular rate and rhythm.  ABDOMEN: Soft, non-tender, bowel sounds are present. EXTREMITIES: No edema.  Left lower extremity status post IM nailing. Left elbow with skin tear CNS: Cranial nerves are intact.   Mild generalized weakness. SKIN: warm and dry.  Status post left hip surgery.  Data Review: I have personally reviewed the following laboratory data and studies,  CBC: Recent Labs  Lab 04/27/20 1130 04/28/20 2055 04/29/20 0353  WBC 7.1 16.0* 7.7  NEUTROABS 5.0  --   --   HGB 10.6* 9.9* 8.5*  HCT 34.2* 31.8* 28.0*  MCV 91.0 91.9 92.4  PLT 221 174 767   Basic Metabolic Panel: Recent Labs  Lab 04/27/20 1130 04/28/20 0501 04/28/20 2055  NA 138 137  --   K 4.3 4.8  --   CL 103 106  --   CO2 23 20*  --   GLUCOSE 89 186*  --   BUN 39* 39*  --   CREATININE 2.27* 2.09* 2.09*  CALCIUM 8.9 8.7*  --    Liver Function Tests: Recent Labs  Lab 04/27/20 1130  AST 16  ALT 11  ALKPHOS 67  BILITOT 0.3  PROT 7.0  ALBUMIN 3.8   No results for input(s): LIPASE, AMYLASE in the last 168 hours. No results for input(s): AMMONIA in the last 168 hours. Cardiac Enzymes: No results for input(s): CKTOTAL, CKMB, CKMBINDEX, TROPONINI in the last 168 hours. BNP (last 3 results) No results for input(s): BNP in the last 8760 hours.  ProBNP (last 3 results) No results for input(s): PROBNP in the last 8760 hours.  CBG: Recent Labs  Lab 04/28/20 1649 04/28/20 1716 04/28/20 1935 04/28/20 2215 04/28/20 2239  GLUCAP 110* 124* 127* 188* 192*   Recent Results (from the past 240 hour(s))  SARS Coronavirus 2 by RT PCR (hospital order, performed in [facility]  hospital lab) Nasopharyngeal Nasopharyngeal Swab     Status: None   Collection Time: 04/27/20  2:58 PM   Specimen: Nasopharyngeal Swab  Result Value Ref Range Status   SARS Coronavirus 2 NEGATIVE NEGATIVE Final    Comment: (NOTE) SARS-CoV-2 target nucleic acids are NOT DETECTED. The SARS-CoV-2 RNA is generally detectable in upper and lower respiratory specimens during the acute phase of infection. The lowest concentration of SARS-CoV-2 viral copies this assay can detect is 250 copies / mL. A negative result does not preclude SARS-CoV-2 infection and should not be used as the sole basis for treatment or other patient management  decisions.  A negative result may occur with improper specimen collection / handling, submission of specimen other than nasopharyngeal swab, presence of viral mutation(s) within the areas targeted by this assay, and inadequate number of viral copies (<250 copies / mL). A negative result must be combined with clinical observations, patient history, and epidemiological information. Fact Sheet for Patients:   StrictlyIdeas.no Fact Sheet for Healthcare Providers: BankingDealers.co.za This test is not yet approved or cleared  by the [place] FDA and has been authorized for detection and/or diagnosis of SARS-CoV-2 by FDA under an Emergency Use Authorization (EUA).  This EUA will remain in effect (meaning this test can be used) for the duration of the COVID-19 declaration under Section 564(b)(1) of the Act, 21 U.S.C. section 360bbb-3(b)(1), unless the authorization is terminated or revoked sooner. Performed at [facility], [address] [place], [place] [address]   Surgical PCR screen     Status: None   Collection Time: 04/28/20  5:58 AM   Specimen: Nasal Mucosa; Nasal Swab  Result Value Ref Range Status   MRSA, PCR NEGATIVE NEGATIVE Final   Staphylococcus aureus NEGATIVE NEGATIVE Final    Comment: (NOTE) The  Xpert SA Assay (FDA approved for NASAL specimens in patients 57 years of age and older), is one component of a comprehensive surveillance program. It is not intended to diagnose infection nor to guide or monitor treatment. Performed at [facility], [place] [address], [place], [place] [address]      Studies: DG Chest 1 View  Result Date: 04/27/2020 CLINICAL DATA:  LEFT hip pain post fall, diabetes mellitus, former smoker EXAM: CHEST  1 VIEW COMPARISON:  06/26/2018 FINDINGS: Normal heart size and pulmonary vascularity. Atherosclerotic calcification and mild tortuosity of thoracic aorta. Lungs clear. No pulmonary infiltrate, pleural effusion or pneumothorax. Bones demineralized. IMPRESSION: No acute abnormalities. Aortic Atherosclerosis (ICD10-I70.0). Electronically Signed   By: Lavonia Dana M.D.   On: 04/27/2020 14:36   CT Head Wo Contrast  Result Date: 04/27/2020 CLINICAL DATA:  Recent trauma.  Prior infarct EXAM: CT HEAD WITHOUT CONTRAST TECHNIQUE: Contiguous axial images were obtained from the base of the skull through the vertex without intravenous contrast. COMPARISON:  July 04, 2018 FINDINGS: Brain: Diffuse atrophy remains stable. There is no intracranial mass, hemorrhage, extra-axial fluid collection, or midline shift. There is evidence of a prior infarct in the left occipital lobe, stable. There are prior small infarcts in the posterior aspect of each cerebellum, stable. There is small vessel disease throughout the centra semiovale bilaterally, stable. No acute infarct is demonstrable on this study. Vascular: No hyperdense vessel evident. There is calcification in each carotid siphon region as well as in the basilar artery region. Skull: Bony calvarium appears intact. Sinuses/Orbits: There is mucosal thickening and opacification in several ethmoid air cells. Other visualized paranasal sinuses are clear. Orbits appear symmetric bilaterally. Other: Visualized mastoid air cells are  clear. IMPRESSION: Stable atrophy with periventricular small vessel disease. Prior focal infarct in the left occipital lobe as well as lacunar type infarcts in each posterior cerebellar hemisphere. No acute infarct evident. No mass or hemorrhage. There are foci of arterial vascular calcification. There is opacification and mucosal thickening in several ethmoid air cells. Electronically Signed   By: Lowella Grip III M.D.   On: 04/27/2020 14:52   DG C-Arm 1-60 Min-No Report  Result Date: 04/28/2020 Fluoroscopy was utilized by the requesting physician.  No radiographic interpretation.   DG Hip Port Unilat With Pelvis 1V Left  Result  Date: 04/28/2020 CLINICAL DATA:  Status post fixation of a left intertrochanteric fracture which the patient suffered a fall 04/27/2020. EXAM: DG HIP (WITH OR WITHOUT PELVIS) 1V PORT LEFT COMPARISON:  Plain films left hip 04/27/2020. FINDINGS: Short intramedullary nail with a hip screw and single distal interlocking screw is in place for fixation of an intertrochanteric fracture. Position and alignment are anatomic. Hardware is intact. Gas in the soft tissues from surgery noted. No acute abnormality. IMPRESSION: Status post fixation of a left intertrochanteric fracture. Position and alignment are anatomic. No acute finding. Electronically Signed   By: Inge Rise M.D.   On: 04/28/2020 20:06   DG Hip Unilat W or Wo Pelvis 2-3 Views Left  Result Date: 04/27/2020 CLINICAL DATA:  LEFT hip pain post fall EXAM: DG HIP (WITH OR WITHOUT PELVIS) 2-3V LEFT COMPARISON:  None FINDINGS: Osseous demineralization. Hip and SI joint spaces preserved. Displaced intertrochanteric fracture LEFT femur with varus angulation. No dislocation. Pelvis appears intact. Degenerative disc disease changes at visualized lower lumbar spine. IMPRESSION: Displaced and angulated intertrochanteric fracture LEFT femur. Electronically Signed   By: Lavonia Dana M.D.   On: 04/27/2020 14:37   DG FEMUR MIN 2  VIEWS LEFT  Result Date: 04/28/2020 CLINICAL DATA:  Intramedullary rod placement EXAM: LEFT FEMUR 2 VIEWS COMPARISON:  04/27/2020 FINDINGS: Four fluoroscopic images are obtained during performance of the procedure and are provided for interpretation only. Intramedullary rod with proximal dynamic screw and distal interlocking screw traverse the intertrochanteric left hip fracture seen previously. Alignment is near anatomic. IMPRESSION: ORIF of left hip fracture. FLUOROSCOPY TIME:  1 minutes 8 seconds Electronically Signed   By: Randa Ngo M.D.   On: 04/28/2020 20:03      Flora Lipps, MD  [facility] 04/29/2020

## 2020-04-29 NOTE — NC FL2 (Signed)
[place] LEVEL OF CARE SCREENING TOOL     IDENTIFICATION  Patient Name: Nancy Medina Birthdate: 05/27/1925 Sex: female Admission Date (Current Location): 04/27/2020  [facility] and [place] Number:  Herbalist and Address:  [facility],  [place] [address], [place]      Provider Number: [id number]  Attending Physician Name and Address:  Flora Lipps, MD  Relative Name and Phone Number:  Mickie Kay, [place]    Current Level of Care: Hospital Recommended Level of Care: [place] Prior Approval Number:    Date Approved/Denied:   PASRR Number: [phone] [phone]  Discharge Plan: SNF    Current Diagnoses: Patient Active Problem List   Diagnosis Date Noted  . Closed left hip fracture, initial encounter ([place]) 04/27/2020  . Closed left hip fracture ([place]) 04/27/2020  . Gait abnormality 09/13/2018  . Mild cognitive impairment 09/13/2018  . Uncontrolled hypertension 07/06/2018  . Fall 07/04/2018  . Scalp laceration 07/04/2018  . Acute bronchitis due to Rhinovirus 03/20/2018  . Diabetic hyperosmolar non-ketotic state ([place]) 03/18/2018  . Acute respiratory failure with hypoxia ([place]) 03/18/2018  . CKD (chronic kidney disease) stage 4, GFR 15-29 ml/min (HCC) 03/18/2018  . Acute lower UTI 02/09/2018  . Nausea 02/09/2018  . Constipation 02/09/2018  . Diarrhea 11/25/2011  . Lymphocytic colitis 06/15/2011  . Esophageal dysphagia 06/15/2011  . UNSPECIFIED ANEMIA 04/27/2009  . ACHILLES TENDINITIS 10/27/2008  . PERIPHERAL EDEMA 10/14/2008  . ALLERGIC RHINITIS, SEASONAL 03/26/2008  . DEGENERATIVE DISC DISEASE 01/15/2008  . Type 2 diabetes mellitus with hyperlipidemia ([place]) 12/13/2007  . HYPERLIPIDEMIA 10/11/2006  . OBESITY NOS 10/11/2006  . ANXIETY 10/11/2006  . Depression 10/11/2006  . GLAUCOMA NOS 10/11/2006  . BENIGN POSITIONAL VERTIGO 10/11/2006  . Essential hypertension 10/11/2006  . GERD 10/11/2006  .  OVERACTIVE BLADDER 10/11/2006  . OSTEOPOROSIS 10/11/2006  . BREAST CANCER, HX OF 10/11/2006  . CEREBROVASCULAR ACCIDENT, HX OF 10/11/2006    Orientation RESPIRATION BLADDER Height & Weight     Self, Situation, Place  Normal External catheter Weight: 77.6 kg Height:  5\' 2"  (157.5 cm)  BEHAVIORAL SYMPTOMS/MOOD NEUROLOGICAL BOWEL NUTRITION STATUS  (none) (none) Continent Diet(see d/c summary)  AMBULATORY STATUS COMMUNICATION OF NEEDS Skin   Extensive Assist Verbally Surgical wounds(L Hip)                       Personal Care Assistance Level of Assistance  Bathing, Feeding, Dressing Bathing Assistance: Maximum assistance Feeding assistance: Independent Dressing Assistance: Maximum assistance     Functional Limitations Info  Sight, Hearing, Speech Sight Info: Adequate Hearing Info: Adequate Speech Info: Adequate    SPECIAL CARE FACTORS FREQUENCY  PT (By licensed PT), OT (By licensed OT)     PT Frequency: 5X/W OT Frequency: 5X/W            Contractures Contractures Info: Not present    Additional Factors Info  Code Status, Allergies Code Status Info: full Allergies Info: Metformin           Current Medications (04/29/2020):  This is the current hospital active medication list Current Facility-Administered Medications  Medication Dose Route Frequency Provider Last Rate Last Admin  . 0.9 %  sodium chloride infusion   Intravenous Continuous Pokhrel, L[place], MD 75 mL/hr at 04/29/20 1125 New Bag at 04/29/20 1125  . acetaminophen (TYLENOL) tablet 325-650 mg  325-650 mg Oral Q6H PRN Merlene Pulling K, PA-C      . acetaminophen (TYLENOL) tablet 500  mg  500 mg Oral Q6H Merlene Pulling K, PA-C   500 mg at 04/29/20 1239  . albuterol (VENTOLIN HFA) 108 (90 Base) MCG/ACT inhaler 1-2 puff  1-2 puff Inhalation Q6H PRN Merlene Pulling K, PA-C   2 puff at 04/27/20 2043  . alum & mag hydroxide-simeth (MAALOX/MYLANTA) 200-200-20 MG/5ML suspension 30 mL  30 mL Oral Q4H PRN Merlene Pulling  K, PA-C      . amLODipine (NORVASC) tablet 5 mg  5 mg Oral Daily Merlene Pulling K, PA-C   5 mg at 04/29/20 0912  . bisacodyl (DULCOLAX) suppository 10 mg  10 mg Rectal Daily PRN Merlene Pulling K, PA-C      . cholecalciferol (VITAMIN D) tablet 2,000 Units  2,000 Units Oral Daily Ventura Bruns, PA-C   2,000 Units at 04/29/20 0913  . cycloSPORINE (RESTASIS) 0.05 % ophthalmic emulsion 1 drop  1 drop Both Eyes BID Merlene Pulling K, PA-C   1 drop at 04/29/20 0916  . docusate sodium (COLACE) capsule 100 mg  100 mg Oral BID PRN Merlene Pulling K, PA-C      . docusate sodium (COLACE) capsule 100 mg  100 mg Oral BID Merlene Pulling K, PA-C   100 mg at 04/29/20 0913  . enoxaparin (LOVENOX) injection 30 mg  30 mg Subcutaneous Q24H Merlene Pulling K, PA-C   30 mg at 04/29/20 1856  . ezetimibe (ZETIA) tablet 10 mg  10 mg Oral Daily Merlene Pulling K, PA-C   10 mg at 04/29/20 0915  . feeding supplement (ENSURE ENLIVE) (ENSURE ENLIVE) liquid 237 mL  237 mL Oral BID BM Brown, Blaine K, PA-C   237 mL at 04/29/20 0900  . ferrous sulfate tablet 325 mg  325 mg Oral TID PC Brown, Blaine K, PA-C   325 mg at 04/29/20 1239  . insulin aspart (novoLOG) injection 0-5 Units  0-5 Units Subcutaneous QHS Merlene Pulling K, PA-C   Stopped at 04/27/20 2200  . insulin aspart (novoLOG) injection 0-9 Units  0-9 Units Subcutaneous TID WC Merlene Pulling K, PA-C   7 Units at 04/29/20 1238  . latanoprost (XALATAN) 0.005 % ophthalmic solution 1 drop  1 drop Both Eyes Daily Merlene Pulling K, PA-C   1 drop at 04/29/20 0920  . loperamide (IMODIUM) capsule 2 mg  2 mg Oral QID PRN Merlene Pulling K, PA-C      . magnesium citrate solution 1 Bottle  1 Bottle Oral Once PRN Merlene Pulling K, PA-C      . menthol-cetylpyridinium (CEPACOL) lozenge 3 mg  1 lozenge Oral PRN Merlene Pulling K, PA-C       Or  . phenol (CHLORASEPTIC) mouth spray 1 spray  1 spray Mouth/Throat PRN Merlene Pulling K, PA-C      . methocarbamol (ROBAXIN) tablet 500 mg  500 mg Oral Q6H PRN Merlene Pulling K, PA-C   500 mg at 04/27/20 2311   Or  . methocarbamol (ROBAXIN) 500 mg in dextrose 5 % 50 mL IVPB  500 mg Intravenous Q6H PRN Merlene Pulling K, PA-C      . mupirocin ointment (BACTROBAN) 2 % 1 application  1 application Nasal BID Ventura Bruns, PA-C   Stopped at 04/28/20 2133  . omega-3 acid ethyl esters (LOVAZA) capsule 1 g  1 g Oral Daily Merlene Pulling K, PA-C   1 g at 04/29/20 0915  . ondansetron (ZOFRAN) tablet 4 mg  4 mg Oral Q6H PRN Ventura Bruns, PA-C  Or  . ondansetron (ZOFRAN) injection 4 mg  4 mg Intravenous Q6H PRN Merlene Pulling K, PA-C      . pantoprazole (PROTONIX) EC tablet 40 mg  40 mg Oral Daily Merlene Pulling K, PA-C   40 mg at 04/29/20 0914  . polyethylene glycol (MIRALAX / GLYCOLAX) packet 17 g  17 g Oral Daily PRN Merlene Pulling K, PA-C      . senna (SENOKOT) tablet 8.6 mg  1 tablet Oral BID Merlene Pulling K, PA-C   8.6 mg at 04/29/20 0913  . senna-docusate (Senokot-S) tablet 1 tablet  1 tablet Oral QHS PRN Merlene Pulling K, PA-C      . timolol (TIMOPTIC) 0.5 % ophthalmic solution 1 drop  1 drop Both Eyes Q12H Merlene Pulling K, PA-C   1 drop at 04/29/20 0920  . traMADol (ULTRAM) tablet 50 mg  50 mg Oral Q12H PRN Merlene Pulling K, PA-C      . vitamin B-12 (CYANOCOBALAMIN) tablet 1,000 mcg  1,000 mcg Oral Daily Merlene Pulling K, PA-C   1,000 mcg at 04/29/20 0981  . zolpidem (AMBIEN) tablet 5 mg  5 mg Oral QHS PRN Ventura Bruns, PA-C         Discharge Medications: Please see discharge summary for a list of discharge medications.  Relevant Imaging Results:  Relevant Lab Results:   Additional Information SS# [id number]  [place], [place]

## 2020-04-29 NOTE — Progress Notes (Signed)
Subjective: 1 Day Post-Op s/p Procedure(s): INTRAMEDULLARY (IM) NAIL FEMORAL   Patient is alert, laying comfortably in bed. States pain is mild this morning.  Denies paraesthesias, chest pain, SOB, abdominal pain nausea or vomiting.  Objective:  PE: VITALS:   Vitals:   04/28/20 2036 04/28/20 2239 04/29/20 0145 04/29/20 0500  BP: (!) 153/67 (!) 149/91  (!) 150/84  Pulse: 69 78  67  Resp: 12 17  18   Temp:  98 F (36.7 C)  98.4 F (36.9 C)  TempSrc:      SpO2: 99% 97% 96%   Weight:      Height:        ABD soft Neurovascular intact Sensation intact distally Intact pulses distally Dorsiflexion/Plantar flexion intact Incision: scant drainage  LABS  Results for orders placed or performed during the hospital encounter of 04/27/20 (from the past 24 hour(s))  Glucose, capillary     Status: Abnormal   Collection Time: 04/28/20  4:49 PM  Result Value Ref Range   Glucose-Capillary 110 (H) 70 - 99 mg/dL  Glucose, capillary     Status: Abnormal   Collection Time: 04/28/20  5:16 PM  Result Value Ref Range   Glucose-Capillary 124 (H) 70 - 99 mg/dL  Glucose, capillary     Status: Abnormal   Collection Time: 04/28/20  7:35 PM  Result Value Ref Range   Glucose-Capillary 127 (H) 70 - 99 mg/dL  CBC     Status: Abnormal   Collection Time: 04/28/20  8:55 PM  Result Value Ref Range   WBC 16.0 (H) 4.0 - 10.5 K/uL   RBC 3.46 (L) 3.87 - 5.11 MIL/uL   Hemoglobin 9.9 (L) 12.0 - 15.0 g/dL   HCT 31.8 (L) 36.0 - 46.0 %   MCV 91.9 80.0 - 100.0 fL   MCH 28.6 26.0 - 34.0 pg   MCHC 31.1 30.0 - 36.0 g/dL   RDW 14.2 11.5 - 15.5 %   Platelets 174 150 - 400 K/uL   nRBC 0.0 0.0 - 0.2 %  Creatinine, serum     Status: Abnormal   Collection Time: 04/28/20  8:55 PM  Result Value Ref Range   Creatinine, Ser 2.09 (H) 0.44 - 1.00 mg/dL   GFR calc non Af Amer 20 (L) >60 mL/min   GFR calc Af Amer 23 (L) >60 mL/min  Glucose, capillary     Status: Abnormal   Collection Time: 04/28/20 10:15 PM    Result Value Ref Range   Glucose-Capillary 188 (H) 70 - 99 mg/dL  Glucose, capillary     Status: Abnormal   Collection Time: 04/28/20 10:39 PM  Result Value Ref Range   Glucose-Capillary 192 (H) 70 - 99 mg/dL  CBC     Status: Abnormal   Collection Time: 04/29/20  3:53 AM  Result Value Ref Range   WBC 7.7 4.0 - 10.5 K/uL   RBC 3.03 (L) 3.87 - 5.11 MIL/uL   Hemoglobin 8.5 (L) 12.0 - 15.0 g/dL   HCT 28.0 (L) 36.0 - 46.0 %   MCV 92.4 80.0 - 100.0 fL   MCH 28.1 26.0 - 34.0 pg   MCHC 30.4 30.0 - 36.0 g/dL   RDW 14.2 11.5 - 15.5 %   Platelets 164 150 - 400 K/uL   nRBC 0.0 0.0 - 0.2 %  Basic metabolic panel     Status: Abnormal   Collection Time: 04/29/20  8:05 AM  Result Value Ref Range   Sodium 135 135 - 145  mmol/L   Potassium 5.6 (H) 3.5 - 5.1 mmol/L   Chloride 103 98 - 111 mmol/L   CO2 18 (L) 22 - 32 mmol/L   Glucose, Bld 252 (H) 70 - 99 mg/dL   BUN 49 (H) 8 - 23 mg/dL   Creatinine, Ser 2.23 (H) 0.44 - 1.00 mg/dL   Calcium 8.4 (L) 8.9 - 10.3 mg/dL   GFR calc non Af Amer 19 (L) >60 mL/min   GFR calc Af Amer 21 (L) >60 mL/min   Anion gap 14 5 - 15  Glucose, capillary     Status: Abnormal   Collection Time: 04/29/20  8:07 AM  Result Value Ref Range   Glucose-Capillary 232 (H) 70 - 99 mg/dL  Glucose, capillary     Status: Abnormal   Collection Time: 04/29/20 11:33 AM  Result Value Ref Range   Glucose-Capillary 326 (H) 70 - 99 mg/dL    DG Chest 1 View  Result Date: 04/27/2020 CLINICAL DATA:  LEFT hip pain post fall, diabetes mellitus, former smoker EXAM: CHEST  1 VIEW COMPARISON:  06/26/2018 FINDINGS: Normal heart size and pulmonary vascularity. Atherosclerotic calcification and mild tortuosity of thoracic aorta. Lungs clear. No pulmonary infiltrate, pleural effusion or pneumothorax. Bones demineralized. IMPRESSION: No acute abnormalities. Aortic Atherosclerosis (ICD10-I70.0). Electronically Signed   By: Lavonia Dana M.D.   On: 04/27/2020 14:36   CT Head Wo Contrast  Result  Date: 04/27/2020 CLINICAL DATA:  Recent trauma.  Prior infarct EXAM: CT HEAD WITHOUT CONTRAST TECHNIQUE: Contiguous axial images were obtained from the base of the skull through the vertex without intravenous contrast. COMPARISON:  July 04, 2018 FINDINGS: Brain: Diffuse atrophy remains stable. There is no intracranial mass, hemorrhage, extra-axial fluid collection, or midline shift. There is evidence of a prior infarct in the left occipital lobe, stable. There are prior small infarcts in the posterior aspect of each cerebellum, stable. There is small vessel disease throughout the centra semiovale bilaterally, stable. No acute infarct is demonstrable on this study. Vascular: No hyperdense vessel evident. There is calcification in each carotid siphon region as well as in the basilar artery region. Skull: Bony calvarium appears intact. Sinuses/Orbits: There is mucosal thickening and opacification in several ethmoid air cells. Other visualized paranasal sinuses are clear. Orbits appear symmetric bilaterally. Other: Visualized mastoid air cells are clear. IMPRESSION: Stable atrophy with periventricular small vessel disease. Prior focal infarct in the left occipital lobe as well as lacunar type infarcts in each posterior cerebellar hemisphere. No acute infarct evident. No mass or hemorrhage. There are foci of arterial vascular calcification. There is opacification and mucosal thickening in several ethmoid air cells. Electronically Signed   By: Lowella Grip III M.D.   On: 04/27/2020 14:52   DG C-Arm 1-60 Min-No Report  Result Date: 04/28/2020 Fluoroscopy was utilized by the requesting physician.  No radiographic interpretation.   DG Hip Port Unilat With Pelvis 1V Left  Result Date: 04/28/2020 CLINICAL DATA:  Status post fixation of a left intertrochanteric fracture which the patient suffered a fall 04/27/2020. EXAM: DG HIP (WITH OR WITHOUT PELVIS) 1V PORT LEFT COMPARISON:  Plain films left hip 04/27/2020.  FINDINGS: Short intramedullary nail with a hip screw and single distal interlocking screw is in place for fixation of an intertrochanteric fracture. Position and alignment are anatomic. Hardware is intact. Gas in the soft tissues from surgery noted. No acute abnormality. IMPRESSION: Status post fixation of a left intertrochanteric fracture. Position and alignment are anatomic. No acute finding. Electronically Signed  By: Inge Rise M.D.   On: 04/28/2020 20:06   DG Hip Unilat W or Wo Pelvis 2-3 Views Left  Result Date: 04/27/2020 CLINICAL DATA:  LEFT hip pain post fall EXAM: DG HIP (WITH OR WITHOUT PELVIS) 2-3V LEFT COMPARISON:  None FINDINGS: Osseous demineralization. Hip and SI joint spaces preserved. Displaced intertrochanteric fracture LEFT femur with varus angulation. No dislocation. Pelvis appears intact. Degenerative disc disease changes at visualized lower lumbar spine. IMPRESSION: Displaced and angulated intertrochanteric fracture LEFT femur. Electronically Signed   By: Lavonia Dana M.D.   On: 04/27/2020 14:37   DG FEMUR MIN 2 VIEWS LEFT  Result Date: 04/28/2020 CLINICAL DATA:  Intramedullary rod placement EXAM: LEFT FEMUR 2 VIEWS COMPARISON:  04/27/2020 FINDINGS: Four fluoroscopic images are obtained during performance of the procedure and are provided for interpretation only. Intramedullary rod with proximal dynamic screw and distal interlocking screw traverse the intertrochanteric left hip fracture seen previously. Alignment is near anatomic. IMPRESSION: ORIF of left hip fracture. FLUOROSCOPY TIME:  1 minutes 8 seconds Electronically Signed   By: Randa Ngo M.D.   On: 04/28/2020 20:03    Assessment/Plan: Principal Problem:   Closed left hip fracture, initial encounter [facility]) Active Problems:   Type 2 diabetes mellitus with hyperlipidemia (HCC)   Essential hypertension   CKD (chronic kidney disease) stage 4, GFR 15-29 ml/min (HCC)   Fall   Mild cognitive impairment   Closed left  hip fracture (HCC)  1 Day Post-Op s/p Procedure(s): INTRAMEDULLARY (IM) NAIL FEMORAL  Weightbearing: WBAT LLE, up with therapy, patient ambulates with walker at basline Insicional and dressing care: Reinforce dressings as needed VTE prophylaxis: lovenox Pain control: tylenol, tramadol only for severe pain Follow - up plan: Follow-up with Dr. Mardelle Matte in 2 weeks Dispo: pending PT eval  Contact information:   Weekdays 8-5 Merlene Pulling, PA-C [phone] A fter hours and holidays please check [email or web address] for group call information for [facility] 04/29/2020, 2:03 PM

## 2020-04-29 NOTE — TOC Initial Note (Signed)
Transition of Care [facility]) - Initial/Assessment Note    Patient Details  Name: Nancy Medina MRN: [id number] Date of Birth: 27-Feb-1925  [facility] of [facility]) CM/SW Contact:    Trish Mage, LCSW Phone Number: 04/29/2020, 2:56 PM  Clinical Narrative:  Patient seen in follow up to PT recommendation of SNF.  Nancy Medina is a resident at [facility], and is hopeful about returning there from hospital.  However, Nancy Medina defers to PT recommendation of SNF, and daughter states she would like her mother to get into [facility] if possible.  [facility] made bed offer, and CSW initiated insurance authorization with [employer]. TOC will continue to follow during the course of hospitalization.                  Expected Discharge Plan: Skilled Nursing Facility Barriers to Discharge: Other (comment)(SNF pending insurance authoriation)   Patient Goals and CMS Choice Patient states their goals for this hospitalization and ongoing recovery are:: Go back to [facility]   Choice offered to / list presented to : Adult Children  Expected Discharge Plan and Services Expected Discharge Plan: [place]   Discharge Planning Services: CM Consult Post Acute Care Choice: [facility] Living arrangements for the past 2 months: [place]                                      Prior Living Arrangements/Services Living arrangements for the past 2 months: [place] Lives with:: Facility Resident Patient language and need for interpreter reviewed:: Yes        Need for Family Participation in Patient Care: Yes (Comment) Care giver support system in place?: Yes (comment) Current home services: DME Criminal Activity/Legal Involvement Pertinent to Current Situation/Hospitalization: No - Comment as needed  Activities of Daily Living Home Assistive Devices/Equipment: Built-in shower seat, Eyeglasses, Grab bars around toilet, Walker (specify  type) ADL Screening (condition at time of admission) Patient's cognitive ability adequate to safely complete daily activities?: Yes Is the patient deaf or have difficulty hearing?: No Does the patient have difficulty seeing, even when wearing glasses/contacts?: No Does the patient have difficulty concentrating, remembering, or making decisions?: Yes Patient able to express need for assistance with ADLs?: Yes Does the patient have difficulty dressing or bathing?: Yes Independently performs ADLs?: No Communication: Independent Dressing (OT): Needs assistance Is this a change from baseline?: Pre-admission baseline Grooming: Appropriate for developmental age Feeding: Independent Bathing: Needs assistance Is this a change from baseline?: Pre-admission baseline Toileting: Independent In/Out Bed: Independent Walks in Home: Independent with device (comment) Does the patient have difficulty walking or climbing stairs?: Yes Weakness of Legs: Both Weakness of Arms/Hands: None  Permission Sought/Granted Permission sought to share information with : Family Supports Permission granted to share information with : Yes, Verbal Permission Granted  Share Information with NAME: Nancy Medina     Permission granted to share info w Relationship: daughter     Emotional Assessment Appearance:: Appears stated age Attitude/Demeanor/Rapport: Engaged Affect (typically observed): Appropriate Orientation: : Oriented to Self, Oriented to Place, Oriented to Situation Alcohol / Substance Use: Not Applicable Psych Involvement: No (comment)  Admission diagnosis:  Closed left hip fracture ([place]) [S72.002A] Closed fracture of left hip, initial encounter ([place]) [S72.002A] Fall, initial encounter [W19.XXXA] Patient Active Problem List   Diagnosis Date Noted  . Closed left hip fracture, initial encounter ([place]) 04/27/2020  .  Closed left hip fracture ([place]) 04/27/2020  . Gait abnormality 09/13/2018  . Mild cognitive  impairment 09/13/2018  . Uncontrolled hypertension 07/06/2018  . Fall 07/04/2018  . Scalp laceration 07/04/2018  . Acute bronchitis due to Rhinovirus 03/20/2018  . Diabetic hyperosmolar non-ketotic state ([place]) 03/18/2018  . Acute respiratory failure with hypoxia ([place]) 03/18/2018  . CKD (chronic kidney disease) stage 4, GFR 15-29 ml/min (HCC) 03/18/2018  . Acute lower UTI 02/09/2018  . Nausea 02/09/2018  . Constipation 02/09/2018  . Diarrhea 11/25/2011  . Lymphocytic colitis 06/15/2011  . Esophageal dysphagia 06/15/2011  . UNSPECIFIED ANEMIA 04/27/2009  . ACHILLES TENDINITIS 10/27/2008  . PERIPHERAL EDEMA 10/14/2008  . ALLERGIC RHINITIS, SEASONAL 03/26/2008  . DEGENERATIVE DISC DISEASE 01/15/2008  . Type 2 diabetes mellitus with hyperlipidemia ([place]) 12/13/2007  . HYPERLIPIDEMIA 10/11/2006  . OBESITY NOS 10/11/2006  . ANXIETY 10/11/2006  . Depression 10/11/2006  . GLAUCOMA NOS 10/11/2006  . BENIGN POSITIONAL VERTIGO 10/11/2006  . Essential hypertension 10/11/2006  . GERD 10/11/2006  . OVERACTIVE BLADDER 10/11/2006  . OSTEOPOROSIS 10/11/2006  . BREAST CANCER, HX OF 10/11/2006  . CEREBROVASCULAR ACCIDENT, HX OF 10/11/2006   PCP:  Lemmie Evens, MD Pharmacy:   [facility], [place] [facility] [place] [address] [place] [address] Phone: [phone] Fax: [phone]     Social Determinants of Health (SDOH) Interventions    Readmission Risk Interventions No flowsheet data found.

## 2020-04-29 NOTE — Plan of Care (Signed)
  Problem: Activity: Goal: Ability to ambulate and perform ADLs will improve Outcome: Progressing   Problem: Clinical Measurements: Goal: Postoperative complications will be avoided or minimized Outcome: Progressing   Problem: Self-Concept: Goal: Ability to maintain and perform role responsibilities to the fullest extent possible will improve Outcome: Progressing   Problem: Pain Management: Goal: Pain level will decrease Outcome: Progressing   

## 2020-04-29 NOTE — Evaluation (Signed)
Physical Therapy Evaluation Patient Details Name: Nancy Medina MRN: [id number] DOB: [birth year] Today's Date: 04/29/2020   History of Present Illness  86 yo female admitted after falling at ALF. S/P IM nail 04/28/20. Hx of falls, BPPV, CVA, CKD, breast ca, anemia, osteoporosis, anxiety, obesity, memory loss, DM  Clinical Impression  On eval, pt required Mod assist +2 for mobility. She was able to perform a stand pivot and walk ~5 feet with use of a RW. Pt presents with general weakness, decreased activity tolerance, and impaired gait and balance. No family present during session. Recommend ST rehab at SNF if family is agreeable     Follow Up Recommendations SNF    Equipment Recommendations  None recommended by PT    Recommendations for Other Services       Precautions / Restrictions Precautions Precautions: Fall Restrictions Weight Bearing Restrictions: No LLE Weight Bearing: Weight bearing as tolerated      Mobility  Bed Mobility Overal bed mobility: Needs Assistance Bed Mobility: Supine to Sit     Supine to sit: HOB elevated;Mod assist;+2 for physical assistance;+2 for safety/equipment     General bed mobility comments: Assist for trunk and bil LEs. Utilized bedpad for positioning. Cues required.  Transfers Overall transfer level: Needs assistance Equipment used: Rolling walker (2 wheeled) Transfers: Sit to/from [facility] Sit to Stand: Mod assist;+2 physical assistance;+2 safety/equipment Stand pivot transfers: Min assist;+2 physical assistance;+2 safety/equipment       General transfer comment: Assist to power up, stabilize, control descent. Wide BOS and posterior bias with intial standing. Cues for safety, technique, hand placement.  Ambulation/Gait Ambulation/Gait assistance: Min assist;+2 physical assistance;+2 safety/equipment Gait Distance (Feet): 5 Feet Assistive device: Rolling walker (2 wheeled) Gait Pattern/deviations: Step-to  pattern;Wide base of support     General Gait Details: Assist to stabilize pt, manage RW, and follow with recliner. Cues for safety, sequence, technique, proper use of RW.  Stairs            Wheelchair Mobility    Modified Rankin (Stroke Patients Only)       Balance Overall balance assessment: Needs assistance         Standing balance support: Bilateral upper extremity supported Standing balance-Leahy Scale: Poor                               Pertinent Vitals/Pain Pain Assessment: Faces Faces Pain Scale: Hurts even more Pain Location: L LE with activity Pain Descriptors / Indicators: Discomfort;Sore;Grimacing Pain Intervention(s): Limited activity within patient's tolerance;Monitored during session;Repositioned    Home Living Family/patient expects to be discharged to:: Unsure     Type of Home: Assisted living Home Access: Level entry     Home Layout: One level Home Equipment: Environmental consultant - 4 wheels      Prior Function Level of Independence: Needs assistance   Gait / Transfers Assistance Needed: ambulate with rollator  ADL's / Homemaking Assistance Needed: ALF staff assists with bathing, dressing        Hand Dominance        Extremity/Trunk Assessment   Upper Extremity Assessment Upper Extremity Assessment: Generalized weakness    Lower Extremity Assessment Lower Extremity Assessment: Generalized weakness    Cervical / Trunk Assessment Cervical / Trunk Assessment: Kyphotic  Communication   Communication: HOH  Cognition Arousal/Alertness: Awake/alert Behavior During Therapy: WFL for tasks assessed/performed Overall Cognitive Status: No family/caregiver present to determine baseline cognitive functioning  General Comments: A&O x 2.      General Comments      Exercises     Assessment/Plan    PT Assessment Patient needs continued PT services  PT Problem List Decreased  strength;Decreased mobility;Decreased range of motion;Decreased activity tolerance;Decreased balance;Decreased knowledge of use of DME;Pain       PT Treatment Interventions DME instruction;Gait training;Therapeutic activities;Therapeutic exercise;Patient/family education;Balance training;Functional mobility training    PT Goals (Current goals can be found in the Care Plan section)  Acute Rehab PT Goals Patient Stated Goal: to get better PT Goal Formulation: With patient Time For Goal Achievement: 05/13/20 Potential to Achieve Goals: Good    Frequency Min 2X/week   Barriers to discharge        Co-evaluation               AM-PAC PT "6 Clicks" Mobility  Outcome Measure Help needed turning from your back to your side while in a flat bed without using bedrails?: A Lot Help needed moving from lying on your back to sitting on the side of a flat bed without using bedrails?: A Lot Help needed moving to and from a bed to a chair (including a wheelchair)?: A Lot Help needed standing up from a chair using your arms (e.g., wheelchair or bedside chair)?: A Lot Help needed to walk in hospital room?: A Lot Help needed climbing 3-5 steps with a railing? : Total 6 Click Score: 11    End of Session Equipment Utilized During Treatment: Gait belt Activity Tolerance: Patient tolerated treatment well Patient left: in chair;with call bell/phone within reach;with chair alarm set   PT Visit Diagnosis: History of falling (Z91.81);Difficulty in walking, not elsewhere classified (R26.2);Muscle weakness (generalized) (M62.81);Pain Pain - Right/Left: Left Pain - part of body: Hip    Time: 1110-1130 PT Time Calculation (min) (ACUTE ONLY): 20 min   Charges:   PT Evaluation $PT Eval Low Complexity: [place], PT Acute Rehabilitation  Office: [phone] Pager: [phone]

## 2020-04-30 ENCOUNTER — Inpatient Hospital Stay
Admission: RE | Admit: 2020-04-30 | Discharge: 2020-05-15 | Disposition: A | Discharge status: Skilled Nursing Facility | Payer: PPO | Source: Ambulatory Visit | Attending: Internal Medicine | Admitting: Internal Medicine

## 2020-04-30 DIAGNOSIS — Z8781 Personal history of (healed) traumatic fracture: Secondary | ICD-10-CM | POA: Diagnosis not present

## 2020-04-30 DIAGNOSIS — I10 Essential (primary) hypertension: Secondary | ICD-10-CM | POA: Diagnosis not present

## 2020-04-30 DIAGNOSIS — G3184 Mild cognitive impairment, so stated: Secondary | ICD-10-CM | POA: Diagnosis not present

## 2020-04-30 DIAGNOSIS — R600 Localized edema: Secondary | ICD-10-CM | POA: Diagnosis not present

## 2020-04-30 DIAGNOSIS — H40053 Ocular hypertension, bilateral: Secondary | ICD-10-CM | POA: Diagnosis not present

## 2020-04-30 DIAGNOSIS — S72002A Fracture of unspecified part of neck of left femur, initial encounter for closed fracture: Secondary | ICD-10-CM | POA: Diagnosis not present

## 2020-04-30 DIAGNOSIS — Z9889 Other specified postprocedural states: Secondary | ICD-10-CM

## 2020-04-30 DIAGNOSIS — E1169 Type 2 diabetes mellitus with other specified complication: Secondary | ICD-10-CM | POA: Diagnosis not present

## 2020-04-30 DIAGNOSIS — R4182 Altered mental status, unspecified: Secondary | ICD-10-CM | POA: Diagnosis not present

## 2020-04-30 DIAGNOSIS — F419 Anxiety disorder, unspecified: Secondary | ICD-10-CM | POA: Diagnosis not present

## 2020-04-30 DIAGNOSIS — D62 Acute posthemorrhagic anemia: Secondary | ICD-10-CM | POA: Diagnosis not present

## 2020-04-30 DIAGNOSIS — R410 Disorientation, unspecified: Secondary | ICD-10-CM | POA: Diagnosis not present

## 2020-04-30 DIAGNOSIS — D649 Anemia, unspecified: Secondary | ICD-10-CM | POA: Diagnosis not present

## 2020-04-30 DIAGNOSIS — I129 Hypertensive chronic kidney disease with stage 1 through stage 4 chronic kidney disease, or unspecified chronic kidney disease: Secondary | ICD-10-CM | POA: Diagnosis not present

## 2020-04-30 DIAGNOSIS — Z743 Need for continuous supervision: Secondary | ICD-10-CM | POA: Diagnosis not present

## 2020-04-30 DIAGNOSIS — Z741 Need for assistance with personal care: Secondary | ICD-10-CM | POA: Diagnosis not present

## 2020-04-30 DIAGNOSIS — Z9181 History of falling: Secondary | ICD-10-CM | POA: Diagnosis not present

## 2020-04-30 DIAGNOSIS — N184 Chronic kidney disease, stage 4 (severe): Secondary | ICD-10-CM | POA: Diagnosis not present

## 2020-04-30 DIAGNOSIS — R52 Pain, unspecified: Secondary | ICD-10-CM | POA: Diagnosis not present

## 2020-04-30 DIAGNOSIS — H409 Unspecified glaucoma: Secondary | ICD-10-CM | POA: Diagnosis not present

## 2020-04-30 DIAGNOSIS — E785 Hyperlipidemia, unspecified: Secondary | ICD-10-CM | POA: Diagnosis not present

## 2020-04-30 DIAGNOSIS — K5909 Other constipation: Secondary | ICD-10-CM | POA: Diagnosis not present

## 2020-04-30 DIAGNOSIS — I1310 Hypertensive heart and chronic kidney disease without heart failure, with stage 1 through stage 4 chronic kidney disease, or unspecified chronic kidney disease: Secondary | ICD-10-CM | POA: Diagnosis not present

## 2020-04-30 DIAGNOSIS — F411 Generalized anxiety disorder: Secondary | ICD-10-CM | POA: Diagnosis not present

## 2020-04-30 DIAGNOSIS — S72002D Fracture of unspecified part of neck of left femur, subsequent encounter for closed fracture with routine healing: Secondary | ICD-10-CM | POA: Diagnosis not present

## 2020-04-30 DIAGNOSIS — M1990 Unspecified osteoarthritis, unspecified site: Secondary | ICD-10-CM | POA: Diagnosis not present

## 2020-04-30 DIAGNOSIS — Z853 Personal history of malignant neoplasm of breast: Secondary | ICD-10-CM | POA: Diagnosis not present

## 2020-04-30 DIAGNOSIS — R41841 Cognitive communication deficit: Secondary | ICD-10-CM | POA: Diagnosis not present

## 2020-04-30 DIAGNOSIS — E1122 Type 2 diabetes mellitus with diabetic chronic kidney disease: Secondary | ICD-10-CM | POA: Diagnosis not present

## 2020-04-30 DIAGNOSIS — M6281 Muscle weakness (generalized): Secondary | ICD-10-CM | POA: Diagnosis not present

## 2020-04-30 DIAGNOSIS — R279 Unspecified lack of coordination: Secondary | ICD-10-CM | POA: Diagnosis not present

## 2020-04-30 DIAGNOSIS — Z4789 Encounter for other orthopedic aftercare: Secondary | ICD-10-CM | POA: Diagnosis not present

## 2020-04-30 DIAGNOSIS — N3281 Overactive bladder: Secondary | ICD-10-CM | POA: Diagnosis not present

## 2020-04-30 DIAGNOSIS — R262 Difficulty in walking, not elsewhere classified: Secondary | ICD-10-CM | POA: Diagnosis not present

## 2020-04-30 DIAGNOSIS — R296 Repeated falls: Secondary | ICD-10-CM | POA: Diagnosis not present

## 2020-04-30 DIAGNOSIS — M810 Age-related osteoporosis without current pathological fracture: Secondary | ICD-10-CM | POA: Diagnosis not present

## 2020-04-30 DIAGNOSIS — S72142D Displaced intertrochanteric fracture of left femur, subsequent encounter for closed fracture with routine healing: Secondary | ICD-10-CM | POA: Diagnosis not present

## 2020-04-30 DIAGNOSIS — K219 Gastro-esophageal reflux disease without esophagitis: Secondary | ICD-10-CM | POA: Diagnosis not present

## 2020-04-30 DIAGNOSIS — W19XXXA Unspecified fall, initial encounter: Secondary | ICD-10-CM | POA: Diagnosis not present

## 2020-04-30 LAB — BASIC METABOLIC PANEL
Anion gap: 11 (ref 5–15)
BUN: 57 mg/dL — ABNORMAL HIGH (ref 8–23)
CO2: 19 mmol/L — ABNORMAL LOW (ref 22–32)
Calcium: 8.3 mg/dL — ABNORMAL LOW (ref 8.9–10.3)
Chloride: 104 mmol/L (ref 98–111)
Creatinine, Ser: 2.24 mg/dL — ABNORMAL HIGH (ref 0.44–1.00)
GFR calc Af Amer: 21 mL/min — ABNORMAL LOW (ref >60)
GFR calc non Af Amer: 18 mL/min — ABNORMAL LOW (ref >60)
Glucose, Bld: 194 mg/dL — ABNORMAL HIGH (ref 70–99)
Potassium: 4.7 mmol/L (ref 3.5–5.1)
Sodium: 134 mmol/L — ABNORMAL LOW (ref 135–145)

## 2020-04-30 LAB — CBC
HCT: 22.7 % — ABNORMAL LOW (ref 36.0–46.0)
HCT: 21.7 % — ABNORMAL LOW (ref 36.0–46.0)
Hemoglobin: 7.1 g/dL — ABNORMAL LOW (ref 12.0–15.0)
Hemoglobin: 7.1 g/dL — ABNORMAL LOW (ref 12.0–15.0)
MCH: 28.3 pg (ref 26.0–34.0)
MCH: 28.3 pg (ref 26.0–34.0)
MCHC: 31.3 g/dL (ref 30.0–36.0)
MCHC: 32.7 g/dL (ref 30.0–36.0)
MCV: 90.4 fL (ref 80.0–100.0)
MCV: 86.5 fL (ref 80.0–100.0)
Platelets: 162 10*3/uL (ref 150–400)
Platelets: 183 10*3/uL (ref 150–400)
RBC: 2.51 MIL/uL — ABNORMAL LOW (ref 3.87–5.11)
RBC: 2.51 MIL/uL — ABNORMAL LOW (ref 3.87–5.11)
RDW: 14.2 % (ref 11.5–15.5)
RDW: 14.4 % (ref 11.5–15.5)
WBC: 10.4 10*3/uL (ref 4.0–10.5)
WBC: 12 10*3/uL — ABNORMAL HIGH (ref 4.0–10.5)
nRBC: 0 % (ref 0.0–0.2)
nRBC: 0 % (ref 0.0–0.2)

## 2020-04-30 LAB — MAGNESIUM: Magnesium: 2.2 mg/dL (ref 1.7–2.4)

## 2020-04-30 LAB — GLUCOSE, CAPILLARY
Glucose-Capillary: 178 mg/dL — ABNORMAL HIGH (ref 70–99)
Glucose-Capillary: 166 mg/dL — ABNORMAL HIGH (ref 70–99)

## 2020-04-30 MED ORDER — POLYETHYLENE GLYCOL 3350 17 G PO PACK: 17.0000 g | PACK | Freq: Every day | ORAL | 0 refills | Status: DC | PRN

## 2020-04-30 MED ORDER — ACETAMINOPHEN 500 MG PO TABS: 500.0000 mg | ORAL_TABLET | Freq: Four times a day (QID) | ORAL | 0 refills | Status: DC | PRN

## 2020-04-30 MED ORDER — FERROUS SULFATE 325 (65 FE) MG PO TABS: 325.0000 mg | ORAL_TABLET | Freq: Three times a day (TID) | ORAL | Status: DC

## 2020-04-30 MED ORDER — ENOXAPARIN SODIUM 30 MG/0.3ML ~~LOC~~ SOLN: 30.0000 mg | SUBCUTANEOUS | 0 refills | Status: DC

## 2020-04-30 MED ORDER — METHOCARBAMOL 500 MG PO TABS: 500.0000 mg | ORAL_TABLET | Freq: Four times a day (QID) | ORAL | 0 refills | Status: DC | PRN

## 2020-04-30 MED ORDER — ENSURE ENLIVE PO LIQD: 237.0000 mL | Freq: Two times a day (BID) | ORAL | Status: DC

## 2020-04-30 MED ORDER — TRAMADOL HCL 50 MG PO TABS: 50.0000 mg | ORAL_TABLET | Freq: Two times a day (BID) | ORAL | 0 refills | Status: DC | PRN

## 2020-04-30 MED ORDER — SENNOSIDES-DOCUSATE SODIUM 8.6-50 MG PO TABS: 1.0000 | ORAL_TABLET | Freq: Every day | ORAL | Status: DC

## 2020-04-30 MED ORDER — ONDANSETRON HCL 4 MG PO TABS: 4.0000 mg | ORAL_TABLET | Freq: Four times a day (QID) | ORAL | Status: DC | PRN

## 2020-04-30 MED ORDER — PROAIR HFA 108 (90 BASE) MCG/ACT IN AERS: 2.0000 | INHALATION_SPRAY | Freq: Four times a day (QID) | RESPIRATORY_TRACT | Status: DC | PRN

## 2020-04-30 NOTE — Care Management Important Message (Signed)
Important Message  Patient Details IM Letter given to Roque Lias SW Case Manager to present to the Patient Name: Nancy Medina MRN: [id number] Date of Birth: 1925-03-27   Medicare Important Message Given:  Yes     Kerin Salen 04/30/2020, 12:26 PM

## 2020-04-30 NOTE — Progress Notes (Signed)
Subjective: 2 Days Post-Op s/p Procedure(s): INTRAMEDULLARY (IM) NAIL FEMORAL   Patient laying comfortably in bed, states she has mild pain at left hip. No other complaints.  Objective:  PE: VITALS:   Vitals:   04/29/20 1411 04/29/20 2013 04/30/20 0407 04/30/20 0600  BP: 136/71 (!) 145/54 (!) 145/72   Pulse: 66 77 66   Resp: 16 18 17    Temp: 98.1 F (36.7 C) 98 F (36.7 C) 97.8 F (36.6 C)   TempSrc: Oral Oral Oral   SpO2: 96% 93% 90% 93%  Weight:      Height:       General: Alert, oriented to self only, in no acute distress GI: Abdomen soft, non-tender MSK: LLE - Distal sensation intact. 2+ DP pulses. EHL and FHL intact. Incision with mepilex and mild drainage. No TTP at left knee.   LABS  Results for orders placed or performed during the hospital encounter of 04/27/20 (from the past 24 hour(s))  Basic metabolic panel     Status: Abnormal   Collection Time: 04/29/20  8:05 AM  Result Value Ref Range   Sodium 135 135 - 145 mmol/L   Potassium 5.6 (H) 3.5 - 5.1 mmol/L   Chloride 103 98 - 111 mmol/L   CO2 18 (L) 22 - 32 mmol/L   Glucose, Bld 252 (H) 70 - 99 mg/dL   BUN 49 (H) 8 - 23 mg/dL   Creatinine, Ser 2.23 (H) 0.44 - 1.00 mg/dL   Calcium 8.4 (L) 8.9 - 10.3 mg/dL   GFR calc non Af Amer 19 (L) >60 mL/min   GFR calc Af Amer 21 (L) >60 mL/min   Anion gap 14 5 - 15  Glucose, capillary     Status: Abnormal   Collection Time: 04/29/20  8:07 AM  Result Value Ref Range   Glucose-Capillary 232 (H) 70 - 99 mg/dL  Glucose, capillary     Status: Abnormal   Collection Time: 04/29/20 11:33 AM  Result Value Ref Range   Glucose-Capillary 326 (H) 70 - 99 mg/dL  Glucose, capillary     Status: Abnormal   Collection Time: 04/29/20  4:22 PM  Result Value Ref Range   Glucose-Capillary 343 (H) 70 - 99 mg/dL  Glucose, capillary     Status: Abnormal   Collection Time: 04/29/20  8:15 PM  Result Value Ref Range   Glucose-Capillary 238 (H) 70 - 99 mg/dL  CBC     Status:  Abnormal   Collection Time: 04/30/20  3:59 AM  Result Value Ref Range   WBC 10.4 4.0 - 10.5 K/uL   RBC 2.51 (L) 3.87 - 5.11 MIL/uL   Hemoglobin 7.1 (L) 12.0 - 15.0 g/dL   HCT 22.7 (L) 36 - 46 %   MCV 90.4 80.0 - 100.0 fL   MCH 28.3 26.0 - 34.0 pg   MCHC 31.3 30.0 - 36.0 g/dL   RDW 14.2 11.5 - 15.5 %   Platelets 162 150 - 400 K/uL   nRBC 0.0 0.0 - 0.2 %  Basic metabolic panel     Status: Abnormal   Collection Time: 04/30/20  3:59 AM  Result Value Ref Range   Sodium 134 (L) 135 - 145 mmol/L   Potassium 4.7 3.5 - 5.1 mmol/L   Chloride 104 98 - 111 mmol/L   CO2 19 (L) 22 - 32 mmol/L   Glucose, Bld 194 (H) 70 - 99 mg/dL   BUN 57 (H) 8 - 23 mg/dL   Creatinine,  Ser 2.24 (H) 0.44 - 1.00 mg/dL   Calcium 8.3 (L) 8.9 - 10.3 mg/dL   GFR calc non Af Amer 18 (L) >60 mL/min   GFR calc Af Amer 21 (L) >60 mL/min   Anion gap 11 5 - 15  Magnesium     Status: None   Collection Time: 04/30/20  3:59 AM  Result Value Ref Range   Magnesium 2.2 1.7 - 2.4 mg/dL    DG C-Arm 1-60 Min-No Report  Result Date: 04/28/2020 Fluoroscopy was utilized by the requesting physician.  No radiographic interpretation.   DG Hip Port Unilat With Pelvis 1V Left  Result Date: 04/28/2020 CLINICAL DATA:  Status post fixation of a left intertrochanteric fracture which the patient suffered a fall 04/27/2020. EXAM: DG HIP (WITH OR WITHOUT PELVIS) 1V PORT LEFT COMPARISON:  Plain films left hip 04/27/2020. FINDINGS: Short intramedullary nail with a hip screw and single distal interlocking screw is in place for fixation of an intertrochanteric fracture. Position and alignment are anatomic. Hardware is intact. Gas in the soft tissues from surgery noted. No acute abnormality. IMPRESSION: Status post fixation of a left intertrochanteric fracture. Position and alignment are anatomic. No acute finding. Electronically Signed   By: Inge Rise M.D.   On: 04/28/2020 20:06   DG FEMUR MIN 2 VIEWS LEFT  Result Date: 04/28/2020 CLINICAL  DATA:  Intramedullary rod placement EXAM: LEFT FEMUR 2 VIEWS COMPARISON:  04/27/2020 FINDINGS: Four fluoroscopic images are obtained during performance of the procedure and are provided for interpretation only. Intramedullary rod with proximal dynamic screw and distal interlocking screw traverse the intertrochanteric left hip fracture seen previously. Alignment is near anatomic. IMPRESSION: ORIF of left hip fracture. FLUOROSCOPY TIME:  1 minutes 8 seconds Electronically Signed   By: Randa Ngo M.D.   On: 04/28/2020 20:03    Assessment/Plan: Principal Problem:   Closed left hip fracture, initial encounter [facility]) Active Problems:   Type 2 diabetes mellitus with hyperlipidemia (HCC)   Essential hypertension   CKD (chronic kidney disease) stage 4, GFR 15-29 ml/min (HCC)   Fall   Mild cognitive impairment   Closed left hip fracture (HCC)  Acute blood loss anemia: Hbg 7.1 this morning, continue iron supplement   2 Days Post-Op s/p Procedure(s): INTRAMEDULLARY (IM) NAIL FEMORAL  Weightbearing: WBAT LLE, up with therapy, patient ambulates with walker at basline Insicional and dressing care: Reinforce dressings as needed VTE prophylaxis: lovenox Pain control: tylenol, tramadol only for severe pain Follow - up plan: Follow-up with Dr. Mardelle Matte in 2 weeks Dispo: discharge to SNF if bed available  Contact information:   Weekdays 8-5 [address], [place] [phone] A fter hours and holidays please check [email or web address] for group call information for [facility]  Ventura Bruns 04/30/2020, 6:41 AM

## 2020-04-30 NOTE — TOC Progression Note (Addendum)
Transition of Care [facility]) - Progression Note    Patient Details  Name: Nancy Medina MRN: [id number] Date of Birth: [birth year]  Transition of Care [facility]) CM/SW [place], [place] Phone Number: 04/30/2020, 11:04 AM  Clinical Narrative:   Received insurance authorization for 7 days, beginning today, auth [id number], and transpo auth. [id number].   Addendum:  Patient to transfer to [facility] today.  Family alerted.  PTAR arranged.  Nursing, please call report to [phone], room 145. TOC sign off.    Expected Discharge Plan: Skilled Nursing Facility Barriers to Discharge: Other (comment) (SNF pending insurance authoriation)  Expected Discharge Plan and Services Expected Discharge Plan: [place]   Discharge Planning Services: CM Consult Post Acute Care Choice: [place] Living arrangements for the past 2 months: Assisted Living Facility                                       Social Determinants of Health (SDOH) Interventions    Readmission Risk Interventions No flowsheet data found.

## 2020-04-30 NOTE — Discharge Summary (Signed)
Physician Discharge Summary  Nancy Medina VQQ:[id number] DOB: 02-Feb-1925 DOA: 04/27/2020  PCP: Lemmie Evens, MD  Admit date: 04/27/2020 Discharge date: 04/30/2020  Admitted From: Assisted living facility  Discharge disposition: SNF   Recommendations for Outpatient Follow-Up:   . Follow up with your primary care provider at the skilled nursing facility in 3 to 5 days . Check CBC, BMP, magnesium in the next visit . Continue iron supplementation for postoperative anemia.  Please closely monitor for anemia in the setting of Lovenox. . Patient will need to follow-up with Dr. Mardelle Matte orthopedics in 2 weeks for hip surgery follow-up. . Patient will need a Lovenox subcu for DVT prophylaxis on discharge.  Discharge Diagnosis:   Principal Problem:   Closed left hip fracture, initial encounter [facility]) Active Problems:   Type 2 diabetes mellitus with hyperlipidemia (HCC)   Essential hypertension   CKD (chronic kidney disease) stage 4, GFR 15-29 ml/min (HCC)   Fall   Mild cognitive impairment   Closed left hip fracture ([place])  Discharge Condition: Improved.  Diet recommendation: Carbohydrate-modified.    Wound care: Hip surgery care  Code status: Full.   History of Present Illness:   Patient is a86 y.o.year-old female with past medical history of stroke, CKD stage III, history of breast cancer, diabetes mellitus type 2,  memory loss presented from the nursing home status post fall with complaints of left hip pain at [facility] emergency department.[place] done in the ED showed a left intertrochanteric fracture.  Ortho was consulted and the patient was considered for admission to the hospital at [facility]..  At baseline, patient ambulates with the help of a walker  Hospital Course:   Following conditions were addressed during hospitalization as listed below,  Status post fall with the left intertrochanteric hip fracture.   Seen by orthopedics and underwent IM nailing on  04/28/2020.  Continue  analgesia.  Pending physical therapy evaluation.  Head scan was negative for acute findings except for old stroke.  EKG on presentation  showed normal sinus rhythm.  Patient will be weightbearing as tolerated.  She will need Lovenox subq on discharge for DVT prophylaxis as per orthopedics..  CKD IIIb -last known creatinine of 2.0 in March 2020.  Creatinine on presentation 2.2.   Will closely monitor with BMP.  Creatinine today at 2.2.   Diabetes mellitus type II.  On oral agents at home.  Continue on discharge.   Essential hypertension.  Continue Norvasc.   History of CVA.  CT head scan showing previous CVA.    Disposition.  At this time, patient is stable for disposition to skilled nursing facility.  Will need to follow-up with orthopedics in 2 weeks.  Spoke with orthopedics about disposition. I spoke with the patient's daughter on the phone and updated about disposition as well.  Medical Consultants:    Orthopedics  Procedures:    IM nailing of the left hip on 04/29/2019 Subjective:   Today, patient feels okay.  Denies overt pain.  No fever chills or rigor.  Discharge Exam:   Vitals:   04/30/20 0407 04/30/20 0600  BP: (!) 145/72   Pulse: 66   Resp: 17   Temp: 97.8 F (36.6 C)   SpO2: 90% 93%   Vitals:   04/29/20 1411 04/29/20 2013 04/30/20 0407 04/30/20 0600  BP: 136/71 (!) 145/54 (!) 145/72   Pulse: 66 77 66   Resp: 16 18 17    Temp: 98.1 F (36.7 C) 98 F (36.7 C)  97.8 F (36.6 C)   TempSrc: Oral Oral Oral   SpO2: 96% 93% 90% 93%  Weight:      Height:       General: Alert awake, communicative, oriented to self, elderly female not in obvious distress HENT: pupils equally reacting to light,  No scleral pallor or icterus noted. Oral mucosa is moist.  Chest:  Clear breath sounds.  Diminished breath sounds bilaterally. No crackles or wheezes.  CVS: S1 &S2 heard. No murmur.  Regular rate and rhythm. Abdomen: Soft, nontender, nondistended.   Bowel sounds are heard.   Extremities: No cyanosis, clubbing with left hip IM nailing.  Peripheral pulses are palpable. Psych: Alert, awake and communicative normal mood CNS:  No cranial nerve deficits.  Power equal in all extremities.   Skin: Warm and dry.    The results of significant diagnostics from this hospitalization (including imaging, microbiology, ancillary and laboratory) are listed below for reference.     Diagnostic Studies:   DG Chest 1 View  Result Date: 04/27/2020 CLINICAL DATA:  LEFT hip pain post fall, diabetes mellitus, former smoker EXAM: CHEST  1 VIEW COMPARISON:  06/26/2018 FINDINGS: Normal heart size and pulmonary vascularity. Atherosclerotic calcification and mild tortuosity of thoracic aorta. Lungs clear. No pulmonary infiltrate, pleural effusion or pneumothorax. Bones demineralized. IMPRESSION: No acute abnormalities. Aortic Atherosclerosis (ICD10-I70.0). Electronically Signed   By: Lavonia Dana M.D.   On: 04/27/2020 14:36   CT Head Wo Contrast  Result Date: 04/27/2020 CLINICAL DATA:  Recent trauma.  Prior infarct EXAM: CT HEAD WITHOUT CONTRAST TECHNIQUE: Contiguous axial images were obtained from the base of the skull through the vertex without intravenous contrast. COMPARISON:  July 04, 2018 FINDINGS: Brain: Diffuse atrophy remains stable. There is no intracranial mass, hemorrhage, extra-axial fluid collection, or midline shift. There is evidence of a prior infarct in the left occipital lobe, stable. There are prior small infarcts in the posterior aspect of each cerebellum, stable. There is small vessel disease throughout the centra semiovale bilaterally, stable. No acute infarct is demonstrable on this study. Vascular: No hyperdense vessel evident. There is calcification in each carotid siphon region as well as in the basilar artery region. Skull: Bony calvarium appears intact. Sinuses/Orbits: There is mucosal thickening and opacification in several ethmoid air cells.  Other visualized paranasal sinuses are clear. Orbits appear symmetric bilaterally. Other: Visualized mastoid air cells are clear. IMPRESSION: Stable atrophy with periventricular small vessel disease. Prior focal infarct in the left occipital lobe as well as lacunar type infarcts in each posterior cerebellar hemisphere. No acute infarct evident. No mass or hemorrhage. There are foci of arterial vascular calcification. There is opacification and mucosal thickening in several ethmoid air cells. Electronically Signed   By: Lowella Grip III M.D.   On: 04/27/2020 14:52   DG C-Arm 1-60 Min-No Report  Result Date: 04/28/2020 Fluoroscopy was utilized by the requesting physician.  No radiographic interpretation.   DG Hip Port Unilat With Pelvis 1V Left  Result Date: 04/28/2020 CLINICAL DATA:  Status post fixation of a left intertrochanteric fracture which the patient suffered a fall 04/27/2020. EXAM: DG HIP (WITH OR WITHOUT PELVIS) 1V PORT LEFT COMPARISON:  Plain films left hip 04/27/2020. FINDINGS: Short intramedullary nail with a hip screw and single distal interlocking screw is in place for fixation of an intertrochanteric fracture. Position and alignment are anatomic. Hardware is intact. Gas in the soft tissues from surgery noted. No acute abnormality. IMPRESSION: Status post fixation of a left intertrochanteric fracture. Position and  alignment are anatomic. No acute finding. Electronically Signed   By: Inge Rise M.D.   On: 04/28/2020 20:06   DG Hip Unilat W or Wo Pelvis 2-3 Views Left  Result Date: 04/27/2020 CLINICAL DATA:  LEFT hip pain post fall EXAM: DG HIP (WITH OR WITHOUT PELVIS) 2-3V LEFT COMPARISON:  None FINDINGS: Osseous demineralization. Hip and SI joint spaces preserved. Displaced intertrochanteric fracture LEFT femur with varus angulation. No dislocation. Pelvis appears intact. Degenerative disc disease changes at visualized lower lumbar spine. IMPRESSION: Displaced and angulated  intertrochanteric fracture LEFT femur. Electronically Signed   By: Lavonia Dana M.D.   On: 04/27/2020 14:37   DG FEMUR MIN 2 VIEWS LEFT  Result Date: 04/28/2020 CLINICAL DATA:  Intramedullary rod placement EXAM: LEFT FEMUR 2 VIEWS COMPARISON:  04/27/2020 FINDINGS: Four fluoroscopic images are obtained during performance of the procedure and are provided for interpretation only. Intramedullary rod with proximal dynamic screw and distal interlocking screw traverse the intertrochanteric left hip fracture seen previously. Alignment is near anatomic. IMPRESSION: ORIF of left hip fracture. FLUOROSCOPY TIME:  1 minutes 8 seconds Electronically Signed   By: Randa Ngo M.D.   On: 04/28/2020 20:03     Labs:   Basic Metabolic Panel: Recent Labs  Lab 04/27/20 1130 04/27/20 1130 04/28/20 0501 04/28/20 0501 04/28/20 2055 04/29/20 0805 04/30/20 0359  NA 138  --  137  --   --  135 134*  K 4.3   < > 4.8   < >  --  5.6* 4.7  CL 103  --  106  --   --  103 104  CO2 23  --  20*  --   --  18* 19*  GLUCOSE 89  --  186*  --   --  252* 194*  BUN 39*  --  39*  --   --  49* 57*  CREATININE 2.27*  --  2.09*  --  2.09* 2.23* 2.24*  CALCIUM 8.9  --  8.7*  --   --  8.4* 8.3*  MG  --   --   --   --   --   --  2.2   < > = values in this interval not displayed.   GFR Estimated Creatinine Clearance: 15.5 mL/min (A) (by C-G formula based on SCr of 2.24 mg/dL (H)). Liver Function Tests: Recent Labs  Lab 04/27/20 1130  AST 16  ALT 11  ALKPHOS 67  BILITOT 0.3  PROT 7.0  ALBUMIN 3.8   No results for input(s): LIPASE, AMYLASE in the last 168 hours. No results for input(s): AMMONIA in the last 168 hours. Coagulation profile No results for input(s): INR, PROTIME in the last 168 hours.  CBC: Recent Labs  Lab 04/27/20 1130 04/28/20 2055 04/29/20 0353 04/30/20 0359 04/30/20 0733  WBC 7.1 16.0* 7.7 10.4 12.0*  NEUTROABS 5.0  --   --   --   --   HGB 10.6* 9.9* 8.5* 7.1* 7.1*  HCT 34.2* 31.8* 28.0*  22.7* 21.7*  MCV 91.0 91.9 92.4 90.4 86.5  PLT 221 174 164 162 183   Cardiac Enzymes: No results for input(s): CKTOTAL, CKMB, CKMBINDEX, TROPONINI in the last 168 hours. BNP: Invalid input(s): POCBNP CBG: Recent Labs  Lab 04/29/20 0807 04/29/20 1133 04/29/20 1622 04/29/20 2015 04/30/20 0744  GLUCAP 232* 326* 343* 238* 178*   D-Dimer No results for input(s): DDIMER in the last 72 hours. Hgb A1c No results for input(s): HGBA1C in the last 72 hours. Lipid  Profile No results for input(s): CHOL, HDL, LDLCALC, TRIG, CHOLHDL, LDLDIRECT in the last 72 hours. Thyroid function studies No results for input(s): TSH, T4TOTAL, T3FREE, THYROIDAB in the last 72 hours.  Invalid input(s): FREET3 Anemia work up No results for input(s): VITAMINB12, FOLATE, FERRITIN, TIBC, IRON, RETICCTPCT in the last 72 hours. Microbiology Recent Results (from the past 240 hour(s))  SARS Coronavirus 2 by RT PCR (hospital order, performed in [facility] hospital lab) Nasopharyngeal Nasopharyngeal Swab     Status: None   Collection Time: 04/27/20  2:58 PM   Specimen: Nasopharyngeal Swab  Result Value Ref Range Status   SARS Coronavirus 2 NEGATIVE NEGATIVE Final    Comment: (NOTE) SARS-CoV-2 target nucleic acids are NOT DETECTED. The SARS-CoV-2 RNA is generally detectable in upper and lower respiratory specimens during the acute phase of infection. The lowest concentration of SARS-CoV-2 viral copies this assay can detect is 250 copies / mL. A negative result does not preclude SARS-CoV-2 infection and should not be used as the sole basis for treatment or other patient management decisions.  A negative result may occur with improper specimen collection / handling, submission of specimen other than nasopharyngeal swab, presence of viral mutation(s) within the areas targeted by this assay, and inadequate number of viral copies (<250 copies / mL). A negative result must be combined with clinical observations,  patient history, and epidemiological information. Fact Sheet for Patients:   StrictlyIdeas.no Fact Sheet for Healthcare Providers: BankingDealers.co.za This test is not yet approved or cleared  by the [place] FDA and has been authorized for detection and/or diagnosis of SARS-CoV-2 by FDA under an Emergency Use Authorization (EUA).  This EUA will remain in effect (meaning this test can be used) for the duration of the COVID-19 declaration under Section 564(b)(1) of the Act, 21 U.S.C. section 360bbb-3(b)(1), unless the authorization is terminated or revoked sooner. Performed at [facility], [address], [place], [place] [address]   Surgical PCR screen     Status: None   Collection Time: 04/28/20  5:58 AM   Specimen: Nasal Mucosa; Nasal Swab  Result Value Ref Range Status   MRSA, PCR NEGATIVE NEGATIVE Final   Staphylococcus aureus NEGATIVE NEGATIVE Final    Comment: (NOTE) The Xpert SA Assay (FDA approved for NASAL specimens in patients 27 years of age and older), is one component of a comprehensive surveillance program. It is not intended to diagnose infection nor to guide or monitor treatment. Performed at [facility], [place] [address], [place], [place] [address]      Discharge Instructions:   Discharge Instructions    Diet Carb Modified   Complete by: As directed    Discharge instructions   Complete by: As directed    Follow-up with your primary care provider at the skilled nursing facility in 3 to 5 days.  Follow-up with orthopedics  Dr. Mardelle Matte in 2 weeks   Discharge wound care:   Complete by: As directed    As per orthopedics team   Increase activity slowly   Complete by: As directed      Allergies as of 04/30/2020      Reactions   Metformin    This allergy is not listed under records with [facility] facility. Past records states that patient "felt poor" as a reaction to taking this  medication      Medication List    STOP taking these medications   sulfamethoxazole-trimethoprim 800-160 MG tablet Commonly known as: BACTRIM DS  TAKE these medications   acetaminophen 500 MG tablet Commonly known as: TYLENOL Take 1 tablet (500 mg total) by mouth every 6 (six) hours as needed for mild pain. What changed:   when to take this  reasons to take this   amLODipine 10 MG tablet Commonly known as: NORVASC Take 1 tablet (10 mg total) by mouth daily. What changed: how much to take   aspirin 81 MG tablet Take 81 mg by mouth daily.   clonazePAM 1 MG tablet Commonly known as: KLONOPIN Take 1 mg by mouth at bedtime. *May take one tablet every 6 hours as needed for anxiety up to three times daily   cycloSPORINE 0.05 % ophthalmic emulsion Commonly known as: RESTASIS Place 1 drop into both eyes 2 (two) times daily.   ezetimibe 10 MG tablet Commonly known as: ZETIA Take 10 mg by mouth daily.   feeding supplement (ENSURE ENLIVE) Liqd Take 237 mLs by mouth 2 (two) times daily between meals.   ferrous sulfate 325 (65 FE) MG tablet Take 1 tablet (325 mg total) by mouth 3 (three) times daily after meals.   fish oil-omega-3 fatty acids 1000 MG capsule Take 1 g by mouth daily.   furosemide 20 MG tablet Commonly known as: LASIX Take 20 mg by mouth daily.   glimepiride 1 MG tablet Commonly known as: AMARYL Take 1 mg by mouth daily.   latanoprost 0.005 % ophthalmic solution Commonly known as: XALATAN Place 1 drop into both eyes at bedtime.   loperamide 2 MG tablet Commonly known as: IMODIUM A-D Take 2 mg by mouth 4 (four) times daily as needed for diarrhea or loose stools.   methocarbamol 500 MG tablet Commonly known as: ROBAXIN Take 1 tablet (500 mg total) by mouth every 6 (six) hours as needed for muscle spasms.   omeprazole 40 MG capsule Commonly known as: PRILOSEC Take 40 mg by mouth daily.   ondansetron 4 MG tablet Commonly known as: ZOFRAN Take  1 tablet (4 mg total) by mouth every 6 (six) hours as needed for nausea.   polyethylene glycol 17 g packet Commonly known as: MIRALAX / GLYCOLAX Take 17 g by mouth daily as needed for moderate constipation.   ProAir HFA 108 (90 Base) MCG/ACT inhaler Generic drug: albuterol Inhale 2 puffs into the lungs every 6 (six) hours as needed for wheezing or shortness of breath. What changed:   how much to take  how to take this  reasons to take this   senna-docusate 8.6-50 MG tablet Commonly known as: Senokot-S Take 1 tablet by mouth at bedtime.   sitaGLIPtin 25 MG tablet Commonly known as: Januvia Take 1 tablet (25 mg total) by mouth daily.   timolol 0.5 % ophthalmic solution Commonly known as: TIMOPTIC Place 1 drop into both eyes every 12 (twelve) hours.   traMADol 50 MG tablet Commonly known as: ULTRAM Take 1 tablet (50 mg total) by mouth every 12 (twelve) hours as needed for severe pain.   vitamin B-12 1000 MCG tablet Commonly known as: CYANOCOBALAMIN Take 1 tablet (1,000 mcg total) by mouth daily.   Vitamin D 50 MCG (2000 UT) Caps Take 1 capsule by mouth daily.            Discharge Care Instructions  (From admission, onward)         Start     Ordered   04/30/20 0000  Discharge wound care:       Comments: As per orthopedics team   04/30/20 1146  Contact information for follow-up providers    Marchia Bond, MD. Schedule an appointment as soon as possible for a visit in 2 weeks.   Specialty: Orthopedic Surgery Contact information: [place] [address] [phone]            Contact information for after-discharge care    [facility] .   Service: Skilled Nursing Contact information: [address] [place] [phone]                  Time coordinating discharge: 39 minutes  Signed:  Shaina Gullatt  [facility] 04/30/2020, 11:47 AM

## 2020-04-30 NOTE — Progress Notes (Signed)
RN phoned report to SNF- [facility] at [phone]. PTAR has been arranged per social work.

## 2020-05-01 ENCOUNTER — Encounter: Payer: Self-pay | Admitting: Adult Health

## 2020-05-01 ENCOUNTER — Non-Acute Institutional Stay (SKILLED_NURSING_FACILITY): Payer: PPO | Admitting: Adult Health

## 2020-05-01 ENCOUNTER — Other Ambulatory Visit: Payer: Self-pay | Admitting: Adult Health

## 2020-05-01 DIAGNOSIS — N184 Chronic kidney disease, stage 4 (severe): Secondary | ICD-10-CM | POA: Diagnosis not present

## 2020-05-01 DIAGNOSIS — E1169 Type 2 diabetes mellitus with other specified complication: Secondary | ICD-10-CM

## 2020-05-01 DIAGNOSIS — R600 Localized edema: Secondary | ICD-10-CM | POA: Insufficient documentation

## 2020-05-01 DIAGNOSIS — S72142D Displaced intertrochanteric fracture of left femur, subsequent encounter for closed fracture with routine healing: Secondary | ICD-10-CM | POA: Diagnosis not present

## 2020-05-01 DIAGNOSIS — K219 Gastro-esophageal reflux disease without esophagitis: Secondary | ICD-10-CM | POA: Diagnosis not present

## 2020-05-01 DIAGNOSIS — K5909 Other constipation: Secondary | ICD-10-CM

## 2020-05-01 DIAGNOSIS — H40053 Ocular hypertension, bilateral: Secondary | ICD-10-CM | POA: Diagnosis not present

## 2020-05-01 DIAGNOSIS — D62 Acute posthemorrhagic anemia: Secondary | ICD-10-CM | POA: Diagnosis not present

## 2020-05-01 DIAGNOSIS — I129 Hypertensive chronic kidney disease with stage 1 through stage 4 chronic kidney disease, or unspecified chronic kidney disease: Secondary | ICD-10-CM

## 2020-05-01 DIAGNOSIS — F419 Anxiety disorder, unspecified: Secondary | ICD-10-CM | POA: Diagnosis not present

## 2020-05-01 DIAGNOSIS — E1122 Type 2 diabetes mellitus with diabetic chronic kidney disease: Secondary | ICD-10-CM

## 2020-05-01 DIAGNOSIS — E785 Hyperlipidemia, unspecified: Secondary | ICD-10-CM

## 2020-05-01 MED ORDER — TRAMADOL HCL 50 MG PO TABS: 50.0000 mg | ORAL_TABLET | Freq: Two times a day (BID) | ORAL | 0 refills | Status: DC | PRN

## 2020-05-01 MED ORDER — CLONAZEPAM 0.5 MG PO TABS: 0.5000 mg | ORAL_TABLET | Freq: Three times a day (TID) | ORAL | 0 refills | Status: DC | PRN

## 2020-05-01 NOTE — Progress Notes (Signed)
Location:    [facility] Room Number: 145/D Place of Service:  SNF (31)   CODE STATUS: DNR  Allergies  Allergen Reactions   Metformin     This allergy is not listed under records with [place] facility. Past records states that patient "felt poor" as a reaction to taking this medication    Chief Complaint  Patient presents with   Hospitalization Follow-up    Hospitalization Follow Up     HPI:  She is a 86 year old woman living in assisted living was hospitalized from 04-27-20 through 04-30-20. She had a fall and suffered a left intertrochanteric femur fracture. She had an IM nail done on 04-28-20.  She is here for short term rehab with her goal to return back to her assisted living. There are no reports of pain; no constipation; no reports fatigue. She will continue to be followed for her chronic illnesses including: diabetes renal failure; anxiety.   Past Medical History:  Diagnosis Date   Achilles tendinitis    Allergic rhinitis    Anemia of chronic disease    at least back to 2010   Anxiety    Benign paroxysmal positional vertigo    Cataract    Cerebrovascular accident [facility])    Chronic kidney disease (CKD), stage III (moderate)    Depression    DJD (degenerative joint disease)    DM (diabetes mellitus) ([place])    type II   GERD (gastroesophageal reflux disease)    HTN (hypertension)    HX: breast cancer    Hyperlipidemia    Hypertonicity of bladder    Knee pain, left    Leg pain    Lymphocytic colitis 2008   treated with Lialda short-term   Memory loss    Obesity    Osteoporosis, unspecified    Renal disease    stage III   Unspecified fall    Unspecified glaucoma(365.9)     Past Surgical History:  Procedure Laterality Date   Bladder tack  2000   BREAST LUMPECTOMY  2004   right   cataracts     CHOLECYSTECTOMY  1960   COLONOSCOPY  07/2007   lymphocytic colitis   ESOPHAGOGASTRODUODENOSCOPY   06/24/2011   Procedure: ESOPHAGOGASTRODUODENOSCOPY (EGD);  Surgeon: Dorothyann Peng, MD;  Location: [facility];  Service: Endoscopy;  Laterality: N/A;.  Peptic stricture, mild erosive reflux esophagitis, hiatal   FEMUR IM NAIL Left 04/28/2020   Procedure: INTRAMEDULLARY (IM) NAIL FEMORAL;  Surgeon: Marchia Bond, MD;  Location: [facility] ORS;  Service: Orthopedics;  Laterality: Left;   FLEXIBLE SIGMOIDOSCOPY  11/25/2011   MODERATE DIVERTICULOSIS/INTERNAL HEMORRHOIDS   S/P Hysterectomy  2000   benign reasons   SAVORY DILATION  06/24/2011   Procedure: SAVORY DILATION;  Surgeon: Dorothyann Peng, MD;  Location: [facility];  Service: Endoscopy;  Laterality: N/A;    Social History   Socioeconomic History   Marital status: Widowed    Spouse name: Not on file   Number of children: 2   Years of education: 12   Highest education level: High school graduate  Occupational History   Occupation: retired    Fish farm manager: RETIRED    Comment: [employer]  Tobacco Use   Smoking status: Former Smoker    Packs/day: 1.00    Types: Cigarettes   Smokeless tobacco: Never Used   Tobacco comment: 30 yrs ago  Vaping Use   Vaping Use: Never used  Substance and Sexual Activity  Alcohol use: No   Drug use: No   Sexual activity: Not on file  Other Topics Concern   Not on file  Social History Narrative   Lives at [facility] in [place].   Right-handed.   2-3 cups caffeine per day.   Social Determinants of Health   Financial Resource Strain:    Difficulty of Paying Living Expenses:   Food Insecurity:    Worried About Charity fundraiser in the Last Year:    Arboriculturist in the Last Year:   Transportation Needs:    Film/video editor (Medical):    Lack of Transportation (Non-Medical):   Physical Activity:    Days of Exercise per Week:    Minutes of Exercise per Session:   Stress:    Feeling of Stress :   Social Connections:    Frequency of  Communication with Friends and Family:    Frequency of Social Gatherings with Friends and Family:    Attends Religious Services:    Active Member of Clubs or Organizations:    Attends Music therapist:    Marital Status:   Intimate Partner Violence:    Fear of Current or Ex-Partner:    Emotionally Abused:    Physically Abused:    Sexually Abused:    Family History  Problem Relation Age of Onset   Arthritis Mother        died at age 82   Other Father        died at age 82 - horse accident   Colon cancer Neg Hx            Liver disease Neg Hx            GI problems Neg Hx               VITAL SIGNS BP (!) 145/72    Pulse 66    Temp 98.1 F (36.7 C) (Oral)    Resp 17    Ht 5\' 2"  (1.575 m)    Wt 179 lb (81.2 kg)    SpO2 90%    BMI 32.74 kg/m   Outpatient Encounter Medications as of 05/01/2020  Medication Sig   acetaminophen (TYLENOL) 500 MG tablet Take 1 tablet (500 mg total) by mouth every 6 (six) hours as needed for mild pain.   amLODipine (NORVASC) 10 MG tablet Take 1 tablet (10 mg total) by mouth daily.   aspirin 81 MG tablet Take 81 mg by mouth daily.     Cholecalciferol (VITAMIN D) 2000 units CAPS Take 1 capsule by mouth daily.   clonazePAM (KLONOPIN) 0.5 MG tablet Take 0.5 mg by mouth every 8 (eight) hours as needed. Special Instructions: As needed for restlessness and agitation thru 05/14/20. Attempt and document a non-drug intervention and outcome prior to administering medication.   cycloSPORINE (RESTASIS) 0.05 % ophthalmic emulsion Place 1 drop into both eyes 2 (two) times daily.   enoxaparin (LOVENOX) 30 MG/0.3ML injection Inject 0.3 mLs (30 mg total) into the skin daily for 27 days.   ezetimibe (ZETIA) 10 MG tablet Take 10 mg by mouth daily.    feeding supplement, ENSURE ENLIVE, (ENSURE ENLIVE) LIQD Take 237 mLs by mouth 2 (two) times daily between meals.   ferrous sulfate 325 (65 FE) MG tablet Take 1 tablet (325 mg total) by  mouth 3 (three) times daily after meals.   fish oil-omega-3 fatty acids 1000 MG capsule Take 1 g by mouth daily.  furosemide (LASIX) 20 MG tablet Take 20 mg by mouth daily.    glimepiride (AMARYL) 1 MG tablet Take 1 mg by mouth daily.    latanoprost (XALATAN) 0.005 % ophthalmic solution Place 1 drop into both eyes at bedtime.    loperamide (IMODIUM A-D) 2 MG tablet Take 2 mg by mouth 4 (four) times daily as needed for diarrhea or loose stools.   methocarbamol (ROBAXIN) 500 MG tablet Take 1 tablet (500 mg total) by mouth every 6 (six) hours as needed for muscle spasms.   NON FORMULARY Diet: _____ Regular, ______ NAS, ___x____Consistent Carbohydrate, _______NPO _____Other   omeprazole (PRILOSEC) 40 MG capsule Take 40 mg by mouth daily.    ondansetron (ZOFRAN) 4 MG tablet Take 1 tablet (4 mg total) by mouth every 6 (six) hours as needed for nausea.   polyethylene glycol (MIRALAX / GLYCOLAX) 17 g packet Take 17 g by mouth daily as needed for moderate constipation.   PROAIR HFA 108 (90 Base) MCG/ACT inhaler Inhale 2 puffs into the lungs every 6 (six) hours as needed for wheezing or shortness of breath.   senna-docusate (SENOKOT-S) 8.6-50 MG tablet Take 1 tablet by mouth at bedtime.   sitaGLIPtin (JANUVIA) 25 MG tablet Take 1 tablet (25 mg total) by mouth daily.   timolol (TIMOPTIC) 0.5 % ophthalmic solution Place 1 drop into both eyes every 12 (twelve) hours.    traMADol (ULTRAM) 50 MG tablet Take 1 tablet (50 mg total) by mouth every 12 (twelve) hours as needed for up to 4 days for severe pain.   vitamin B-12 (CYANOCOBALAMIN) 1000 MCG tablet Take 1 tablet (1,000 mcg total) by mouth daily.   No facility-administered encounter medications on file as of 05/01/2020.     SIGNIFICANT DIAGNOSTIC EXAMS  TODAY  04-27-20: left hip x-ray: Displaced and angulated intertrochanteric fracture LEFT femur.  04-27-20: chest x-ray: No acute abnormalities. Aortic Atherosclerosis   04-27-20: CT  of head:  Stable atrophy with periventricular small vessel disease. Prior focal infarct in the left occipital lobe as well as lacunar type infarcts in each posterior cerebellar hemisphere. No acute infarct evident. No mass or hemorrhage. There are foci of arterial vascular calcification. There is opacification and mucosal thickening in several ethmoid air cells.   LABS REVIEWED TODAY  04-27-20: wbc 7.1; hgb 10.6; hct 34.2; mcv 91.0 plt 221; glucose 89; bun 39; creat 2.27; k+ 4.3; na++ 138; ca 8.9 liver normal albumin 3.8; hgb a1c 7.1 04-29-20: wbc 7.7; hgb 8.5; hct 28.0 mcv 92.4 plt 164; glucose 252; bun 49; creat 2.23; k+ 5.6 na++ 135; ca 8.4 04-30-20: wbc 10.4; hgb 71.; hct 22.7; mcv 90.4 plt 162; glucose 194; bun 57; creat 2.24; k+ 4.7; na++ 134; ca 8.3 mag 2.2    Review of Systems  Constitutional: Negative for malaise/fatigue.  Respiratory: Negative for cough and shortness of breath.   Cardiovascular: Negative for chest pain, palpitations and leg swelling.  Gastrointestinal: Negative for abdominal pain, constipation and heartburn.  Musculoskeletal: Negative for back pain, joint pain and myalgias.  Skin: Negative.   Neurological: Negative for dizziness.  Psychiatric/Behavioral: The patient is not nervous/anxious.     Physical Exam Constitutional:      General: She is not in acute distress.    Appearance: She is well-developed. She is not diaphoretic.  Neck:     Thyroid: No thyromegaly.  Cardiovascular:     Rate and Rhythm: Normal rate and regular rhythm.     Pulses: Normal pulses.     Heart  sounds: Normal heart sounds.  Pulmonary:     Effort: Pulmonary effort is normal. No respiratory distress.     Breath sounds: Normal breath sounds.  Abdominal:     General: Bowel sounds are normal. There is no distension.     Palpations: Abdomen is soft.     Tenderness: There is no abdominal tenderness.  Musculoskeletal:     Cervical back: Neck supple.     Right lower leg: No edema.     Left  lower leg: No edema.     Comments: Is able to move all extremities 04-28-20 left femur IM nail  Lymphadenopathy:     Cervical: No cervical adenopathy.  Skin:    General: Skin is warm and dry.  Neurological:     Mental Status: She is alert. Mental status is at baseline.  Psychiatric:        Mood and Affect: Mood normal.       ASSESSMENT/ PLAN:  TODAY  1. Displaced intertrochanteric fracture of left femur subsequent encounter for closed fracture with routine healing: is stable will continue therapy as directed will follow up with orthopedics will continue lovenox 30 mg daily for 27 days; will continue ultram 50 mg every 12 hours as needed through 05-05-20. Robaxin 500 mg every 6 hours as needed   2. Hypertension associated with stage 4 chronic kidney disease due to type 2 diabetes mellitus: is stable b/p 145/72 will continue norvasc 10 mg daily asa 81 mg daily   3. Hyperlipidemia associated with type 2 diabetes mellitus: is stable will continue zetia 10 mg daily fish oil 1 gm daily    4. CKD stage 4 due to type 2 diabetes mellitus: is stable bun 57 creat 2.24  5. Gastroesophageal reflux disease without esophagitis: is stable will continue prilosec 40 mg daily   6. Type 2 diabetes mellitus with hyperlipidemia: is stable hgb a1c 7.1 will continue amaryl 1 mg daily januvia 25 mg daily   7. Acute blood loss anemia: is without change hgb 7.1 will continue iron three times daily   8. Bilateral lower extremity edema: is stable will continue lasix 20 mg daily   9. Increased intraocular pressure bilateral: is stable will continue timpotic to both eyes twice daily xalatan to both eyes nightly   10. Chronic constipation: is stable will continue senna s daily and miralax daily as needed  11. Anxiety: is stable will continue klonopin 0.5 mg every 8 hours as needed.    Will check h&h bmp     MD is aware of resident's narcotic use and is in agreement with current plan of care. We will  attempt to wean resident as appropriate.  Ok Edwards NP [facility]  Contact [phone] Monday through Friday 8am- 5pm  After hours call [phone]

## 2020-05-04 ENCOUNTER — Non-Acute Institutional Stay (SKILLED_NURSING_FACILITY): Payer: PPO | Admitting: Internal Medicine

## 2020-05-04 ENCOUNTER — Encounter (HOSPITAL_COMMUNITY)
Admission: RE | Admit: 2020-05-04 | Discharge: 2020-05-04 | Disposition: A | Discharge status: Home / Self Care | Payer: PPO | Source: Skilled Nursing Facility | Attending: Adult Health | Admitting: Adult Health

## 2020-05-04 ENCOUNTER — Encounter: Payer: Self-pay | Admitting: Internal Medicine

## 2020-05-04 DIAGNOSIS — S72142D Displaced intertrochanteric fracture of left femur, subsequent encounter for closed fracture with routine healing: Secondary | ICD-10-CM

## 2020-05-04 DIAGNOSIS — D62 Acute posthemorrhagic anemia: Secondary | ICD-10-CM | POA: Diagnosis not present

## 2020-05-04 DIAGNOSIS — G3184 Mild cognitive impairment, so stated: Secondary | ICD-10-CM | POA: Diagnosis not present

## 2020-05-04 DIAGNOSIS — I1310 Hypertensive heart and chronic kidney disease without heart failure, with stage 1 through stage 4 chronic kidney disease, or unspecified chronic kidney disease: Secondary | ICD-10-CM | POA: Diagnosis not present

## 2020-05-04 LAB — BASIC METABOLIC PANEL
Anion gap: 11 (ref 5–15)
BUN: 38 mg/dL — ABNORMAL HIGH (ref 8–23)
CO2: 24 mmol/L (ref 22–32)
Calcium: 8.4 mg/dL — ABNORMAL LOW (ref 8.9–10.3)
Chloride: 102 mmol/L (ref 98–111)
Creatinine, Ser: 1.38 mg/dL — ABNORMAL HIGH (ref 0.44–1.00)
GFR calc Af Amer: 38 mL/min — ABNORMAL LOW (ref >60)
GFR calc non Af Amer: 33 mL/min — ABNORMAL LOW (ref >60)
Glucose, Bld: 164 mg/dL — ABNORMAL HIGH (ref 70–99)
Potassium: 3.5 mmol/L (ref 3.5–5.1)
Sodium: 137 mmol/L (ref 135–145)

## 2020-05-04 LAB — HEMOGLOBIN AND HEMATOCRIT, BLOOD
HCT: 24.6 % — ABNORMAL LOW (ref 36.0–46.0)
Hemoglobin: 7.6 g/dL — ABNORMAL LOW (ref 12.0–15.0)

## 2020-05-04 NOTE — Assessment & Plan Note (Signed)
Serial CBCs improving.  Anemia is normochromic/normocytic which makes significant B12 deficiency less likely.

## 2020-05-04 NOTE — Progress Notes (Signed)
NURSING HOME LOCATION: [place] ROOM NUMBER: 145/D   CODE STATUS:  DNR  PCP:  Lemmie Evens MD  This is a comprehensive admission note to [facility] performed on this date less than 30 days from date of admission. Included are preadmission medical/surgical history; reconciled medication list; family history; social history and comprehensive review of systems.  Corrections and additions to the records were documented. Comprehensive physical exam was also performed. Additionally a clinical summary was entered for each active diagnosis pertinent to this admission in the Problem List to enhance continuity of care.  HPI: Patient was hospitalized 6/7-6/08/2020 with closed left hip fracture sustained in a mechanical fall at [facility].  Dr. Mardelle Matte performed IM nailing on 6/8.  Postop WBAT and SQ Lovenox until 7/8 for DVT prophylaxis were recommended. CT of the head revealed no acute findings; there was evidence of prior stroke. Baseline CKD had deteriorated somewhat with a rising creatinine from 2.0 in March 2020 up to 2.2. She has a history of mild neurocognitive deficit.  In October 2019 she saw Dr. Krista Blue who documented a score of 29 out of 30 on MMSE.  MRI of the brain in August 2019 had revealed stable generalized atrophy, supratentorium small vessel disease, chronic encephalomalacia involving the posterior inferior left parietal and occipital lobes, remote lacunar infarct involving bilateral basal ganglia and cerebellum.  Past medical and surgical history: Includes type 2 diabetes with CKD , history of stroke, history of breast cancer, dyslipidemia, essential hypertension, history of lymphocytic colitis, & glaucoma. Surgeries & procedures include cholecystectomy, hysterectomy, and breast lumpectomy.  Social history: Nondrinker; remote smoker.  Family history: Reviewed, noncontributory due to age.   Review of systems:  Could not be completed due to dementia.   She could not give me the date but thought it was Sunday.  She stated that she had been asked this question earlier but could not remember as "we have not had the TV on".  She did know that she had been in the hospital and had broken her hip.  She denied any cardiac or neurologic prodrome prior to the fall stating "I was walking in the dining room secure and pretty and boom".  She was confabulating.  She told me she was tired because she had been playing volleyball with an old man.  When I asked what position she said "center".  Despite extensive bruising on the left upper extremity she denied any bruising tendency or bleeding dyscrasias.  She stated that she was not hungry because "there is too much food".  Staff reports she is not asking for pain medicine.  Physical exam:  Pertinent or positive findings: Although confused and confabulating, she is interactive exhibiting self-deprecating humor.  There is slight anisocoria with the right pupil slightly larger than the left.  There is malalignment of the teeth .  She has minor bibasilar rhonchi which improve or dissipate with repeated inspirations.  Abdomen is protuberant.  Pedal pulses are decreased.  She has 1/2+ edema at the sock line.  There is extensive bruising over the left upper extremity.  Strength to opposition is fair-good in the upper extremities.  Lower extremity not tested because of history of fracture.  General appearance: Adequately nourished; no acute distress, increased work of breathing is present.   Lymphatic: No lymphadenopathy about the head, neck, axilla. Eyes: No conjunctival inflammation or lid edema is present. There is no scleral icterus. Ears:  External ear exam shows no significant lesions or deformities.  Nose:  External nasal examination shows no deformity or inflammation. Nasal mucosa are pink and moist without lesions, exudates Oral exam: Lips and gums are healthy appearing.There is no oropharyngeal erythema or  exudate. Neck:  No thyromegaly, masses, tenderness noted.    Heart:  No gallop, murmur, click, rub.  Lungs:  without wheezes,  rales, rubs. Abdomen: Bowel sounds are normal.  Abdomen is soft and nontender with no organomegaly, hernias, masses. GU: Deferred  Extremities:  No cyanosis, clubbing. Neurologic exam:  Balance, Rhomberg, finger to nose testing could not be completed due to clinical state Skin: Warm & dry w/o tenting. No significant  rash.  See clinical summary under each active problem in the Problem List with associated updated therapeutic plan

## 2020-05-04 NOTE — Patient Instructions (Signed)
See assessment and plan under each diagnosis in the problem list and acutely for this visit 

## 2020-05-04 NOTE — Assessment & Plan Note (Signed)
PT/OT at SNF  Orthopedic follow-up as scheduled  

## 2020-05-04 NOTE — Assessment & Plan Note (Signed)
Update B12 & TSH

## 2020-05-05 ENCOUNTER — Encounter: Payer: Self-pay | Admitting: Adult Health

## 2020-05-05 ENCOUNTER — Non-Acute Institutional Stay (SKILLED_NURSING_FACILITY): Payer: PPO | Admitting: Adult Health

## 2020-05-05 DIAGNOSIS — S72142D Displaced intertrochanteric fracture of left femur, subsequent encounter for closed fracture with routine healing: Secondary | ICD-10-CM | POA: Diagnosis not present

## 2020-05-05 NOTE — Progress Notes (Signed)
Location:    [facility] Room Number: 145/D Place of Service:  SNF (31)   CODE STATUS: DNR  Allergies  Allergen Reactions  . Metformin     This allergy is not listed under records with [facility] facility. Past records states that patient "felt poor" as a reaction to taking this medication    Chief Complaint  Patient presents with  . Acute Visit    Pain Management    HPI:  She has ultram 50 mg twice daily as needed ordered to manage her pain from her left femoral fracture. She has not required the use of this medication. She denies any pain. She does tell therapy at times that she is having pain. She would benefit from routine tylenol .   Past Medical History:  Diagnosis Date  . Achilles tendinitis   . Allergic rhinitis   . Anemia of chronic disease    at least back to 2010  . Anxiety   . Benign paroxysmal positional vertigo   . Cataract   . Cerebrovascular accident ([place])   . Chronic kidney disease (CKD), stage III (moderate)   . Depression   . DJD (degenerative joint disease)   . DM (diabetes mellitus) ([place])    type II  . GERD (gastroesophageal reflux disease)   . HTN (hypertension)   . HX: breast cancer   . Hyperlipidemia   . Hypertonicity of bladder   . Knee pain, left   . Leg pain   . Lymphocytic colitis 2008   treated with Lialda short-term  . Memory loss   . Obesity   . Osteoporosis, unspecified   . Renal disease    stage III  . Unspecified fall   . Unspecified glaucoma(365.9)     Past Surgical History:  Procedure Laterality Date  . Bladder tack  2000  . BREAST LUMPECTOMY  2004   right  . cataracts    . CHOLECYSTECTOMY  1960  . COLONOSCOPY  07/2007   lymphocytic colitis  . ESOPHAGOGASTRODUODENOSCOPY  06/24/2011   Procedure: ESOPHAGOGASTRODUODENOSCOPY (EGD);  Surgeon: Dorothyann Peng, MD;  [facility];  Service: Endoscopy;  Laterality: N/A;.  Peptic stricture, mild erosive reflux esophagitis, hiatal  . FEMUR  IM NAIL Left 04/28/2020   Procedure: INTRAMEDULLARY (IM) NAIL FEMORAL;  Surgeon: Marchia Bond, MD;  Location: [facility]S;  Service: Orthopedics;  Laterality: Left;  . FLEXIBLE SIGMOIDOSCOPY  11/25/2011   MODERATE DIVERTICULOSIS/INTERNAL HEMORRHOIDS  . S/P Hysterectomy  2000   benign reasons  . SAVORY DILATION  06/24/2011   Procedure: SAVORY DILATION;  Surgeon: Dorothyann Peng, MD;  [facility];  Service: Endoscopy;  Laterality: N/A;    Social History   Socioeconomic History  . Marital status: Widowed    Spouse name: Not on file  . Number of children: 2  . Years of education: 21  . Highest education level: High school graduate  Occupational History  . Occupation: retired    Fish farm manager: RETIRED    Comment: [employer]  Tobacco Use  . Smoking status: Former Smoker    Packs/day: 1.00    Types: Cigarettes  . Smokeless tobacco: Never Used  . Tobacco comment: 30 yrs ago  Vaping Use  . Vaping Use: Never used  Substance and Sexual Activity  . Alcohol use: No  . Drug use: No  . Sexual activity: Not on file  Other Topics Concern  . Not on file  Social History Narrative   Lives at  [employer] in [place].   Right-handed.   2-3 cups caffeine per day.   Social Determinants of Health   Financial Resource Strain:   . Difficulty of Paying Living Expenses:   Food Insecurity:   . Worried About Charity fundraiser in the Last Year:   . Arboriculturist in the Last Year:   Transportation Needs:   . Film/video editor (Medical):   Marland Kitchen Lack of Transportation (Non-Medical):   Physical Activity:   . Days of Exercise per Week:   . Minutes of Exercise per Session:   Stress:   . Feeling of Stress :   Social Connections:   . Frequency of Communication with Friends and Family:   . Frequency of Social Gatherings with Friends and Family:   . Attends Religious Services:   . Active Member of Clubs or Organizations:   . Attends Archivist Meetings:     Marland Kitchen Marital Status:   Intimate Partner Violence:   . Fear of Current or Ex-Partner:   . Emotionally Abused:   Marland Kitchen Physically Abused:   . Sexually Abused:    Family History  Problem Relation Age of Onset  . Arthritis Mother        died at age 79  . Other Father        died at age 43 - horse accident  . Colon cancer Neg Hx           . Liver disease Neg Hx           . GI problems Neg Hx               VITAL SIGNS BP 108/74   Pulse 78   Temp (!) 97 F (36.1 C) (Oral)   Resp 20   Ht 5' (1.524 m)   Wt 171 lb (77.6 kg)   BMI 33.40 kg/m   Outpatient Encounter Medications as of 05/05/2020  Medication Sig  . acetaminophen (TYLENOL) 650 MG CR tablet Take 650 mg by mouth every 6 (six) hours.  Marland Kitchen amLODipine (NORVASC) 10 MG tablet Take 1 tablet (10 mg total) by mouth daily.  Marland Kitchen aspirin 81 MG tablet Take 81 mg by mouth daily.    . Cholecalciferol (VITAMIN D) 2000 units CAPS Take 1 capsule by mouth daily.  . clonazePAM (KLONOPIN) 0.5 MG tablet Take 0.5 mg by mouth every 8 (eight) hours as needed. Special Instructions: As needed for restlessness and agitation thru 05/14/20. Attempt and document a non-drug intervention and outcome prior to administering medication.  . cycloSPORINE (RESTASIS) 0.05 % ophthalmic emulsion Place 1 drop into both eyes 2 (two) times daily.  Marland Kitchen enoxaparin (LOVENOX) 30 MG/0.3ML injection Inject 0.3 mLs (30 mg total) into the skin daily for 27 days.  Marland Kitchen ezetimibe (ZETIA) 10 MG tablet Take 10 mg by mouth daily.   . feeding supplement, ENSURE ENLIVE, (ENSURE ENLIVE) LIQD Take 237 mLs by mouth 2 (two) times daily between meals.  . ferrous sulfate 325 (65 FE) MG tablet Take 1 tablet (325 mg total) by mouth 3 (three) times daily after meals.  . fish oil-omega-3 fatty acids 1000 MG capsule Take 1 g by mouth daily.   . furosemide (LASIX) 20 MG tablet Take 20 mg by mouth daily.   Marland Kitchen glimepiride (AMARYL) 1 MG tablet Take 1 mg by mouth daily.   Marland Kitchen latanoprost (XALATAN) 0.005 %  ophthalmic solution Place 1 drop into both eyes at bedtime.   Marland Kitchen loperamide (IMODIUM A-D)  2 MG tablet Take 2 mg by mouth 4 (four) times daily as needed for diarrhea or loose stools.  . methocarbamol (ROBAXIN) 500 MG tablet Take 1 tablet (500 mg total) by mouth every 6 (six) hours as needed for muscle spasms.  . NON FORMULARY Diet: _____ Regular, ______ NAS, ___x____Consistent Carbohydrate, _______NPO _____Other  . omeprazole (PRILOSEC) 40 MG capsule Take 40 mg by mouth daily.   . ondansetron (ZOFRAN) 4 MG tablet Take 1 tablet (4 mg total) by mouth every 6 (six) hours as needed for nausea.  . polyethylene glycol (MIRALAX / GLYCOLAX) 17 g packet Take 17 g by mouth daily as needed for moderate constipation.  Marland Kitchen PROAIR HFA 108 (90 Base) MCG/ACT inhaler Inhale 2 puffs into the lungs every 6 (six) hours as needed for wheezing or shortness of breath.  . senna-docusate (SENOKOT-S) 8.6-50 MG tablet Take 1 tablet by mouth at bedtime.  . sitaGLIPtin (JANUVIA) 25 MG tablet Take 1 tablet (25 mg total) by mouth daily.  . timolol (TIMOPTIC) 0.5 % ophthalmic solution Place 1 drop into both eyes every 12 (twelve) hours.   . vitamin B-12 (CYANOCOBALAMIN) 1000 MCG tablet Take 1 tablet (1,000 mcg total) by mouth daily.  . [DISCONTINUED] acetaminophen (TYLENOL) 500 MG tablet Take 1 tablet (500 mg total) by mouth every 6 (six) hours as needed for mild pain.  . [DISCONTINUED] traMADol (ULTRAM) 50 MG tablet Take 1 tablet (50 mg total) by mouth every 12 (twelve) hours as needed for up to 4 days for severe pain.   No facility-administered encounter medications on file as of 05/05/2020.     SIGNIFICANT DIAGNOSTIC EXAMS  PREVIOUS  04-27-20: left hip x-ray: Displaced and angulated intertrochanteric fracture LEFT femur.  04-27-20: chest x-ray: No acute abnormalities. Aortic Atherosclerosis   04-27-20: CT of head:  Stable atrophy with periventricular small vessel disease. Prior focal infarct in the left occipital lobe as  well as lacunar type infarcts in each posterior cerebellar hemisphere. No acute infarct evident. No mass or hemorrhage. There are foci of arterial vascular calcification. There is opacification and mucosal thickening in several ethmoid air cells.  NO NEW EXAMS.    LABS REVIEWED PREVIOUS  04-27-20: wbc 7.1; hgb 10.6; hct 34.2; mcv 91.0 plt 221; glucose 89; bun 39; creat 2.27; k+ 4.3; na++ 138; ca 8.9 liver normal albumin 3.8; hgb a1c 7.1 04-29-20: wbc 7.7; hgb 8.5; hct 28.0 mcv 92.4 plt 164; glucose 252; bun 49; creat 2.23; k+ 5.6 na++ 135; ca 8.4 04-30-20: wbc 10.4; hgb 7.1; hct 22.7; mcv 90.4 plt 162; glucose 194; bun 57; creat 2.24; k+ 4.7; na++ 134; ca 8.3 mag 2.2   TODAY  05-04-20 hgb 7.6; hct 24.6 glucose 164; bun 38; creat 1.38; k+ 3.5; na++ 137; ca 8.4    Review of Systems  Constitutional: Negative for malaise/fatigue.  Respiratory: Negative for cough and shortness of breath.   Cardiovascular: Negative for chest pain, palpitations and leg swelling.  Gastrointestinal: Negative for abdominal pain, constipation and heartburn.  Musculoskeletal: Negative for back pain, joint pain and myalgias.  Skin: Negative.   Neurological: Negative for dizziness.  Psychiatric/Behavioral: The patient is not nervous/anxious.     Physical Exam Constitutional:      General: She is not in acute distress.    Appearance: She is well-developed. She is not diaphoretic.  Neck:     Thyroid: No thyromegaly.  Cardiovascular:     Rate and Rhythm: Normal rate and regular rhythm.     Pulses: Normal pulses.  Heart sounds: Normal heart sounds.  Pulmonary:     Effort: Pulmonary effort is normal. No respiratory distress.     Breath sounds: Normal breath sounds.  Abdominal:     General: Bowel sounds are normal. There is no distension.     Palpations: Abdomen is soft.     Tenderness: There is no abdominal tenderness.  Musculoskeletal:     Cervical back: Neck supple.     Right lower leg: No edema.     Left  lower leg: No edema.     Comments:  Is able to move all extremities 04-28-20 left femur IM nail   Lymphadenopathy:     Cervical: No cervical adenopathy.  Skin:    General: Skin is warm and dry.  Neurological:     Mental Status: She is alert. Mental status is at baseline.  Psychiatric:        Mood and Affect: Mood normal.       ASSESSMENT/ PLAN:  TODAY  1. Displaced intertrochanteric fracture of left femur subsequent encounter with routine healing.  Is stable Will continue therapy as directed Will stop ultram Will begin tylenol cr 650 mg every 6 hours routinely     MD is aware of resident's narcotic use and is in agreement with current plan of care. We will attempt to wean resident as appropriate.  Ok Edwards NP [facility]  Contact [phone] Monday through Friday 8am- 5pm  After hours call [phone]

## 2020-05-07 ENCOUNTER — Encounter (HOSPITAL_COMMUNITY)
Admission: RE | Admit: 2020-05-07 | Discharge: 2020-05-07 | Disposition: A | Discharge status: Home / Self Care | Payer: PPO | Source: Skilled Nursing Facility | Attending: Adult Health | Admitting: Adult Health

## 2020-05-07 LAB — TSH: TSH: 1.265 u[IU]/mL (ref 0.350–4.500)

## 2020-05-07 LAB — FOLATE: Folate: 5.9 ng/mL — ABNORMAL LOW (ref >5.9)

## 2020-05-07 LAB — VITAMIN B12: Vitamin B-12: 1915 pg/mL — ABNORMAL HIGH (ref 180–914)

## 2020-05-08 LAB — RPR: RPR Ser Ql: NONREACTIVE

## 2020-05-11 DIAGNOSIS — S72142D Displaced intertrochanteric fracture of left femur, subsequent encounter for closed fracture with routine healing: Secondary | ICD-10-CM | POA: Diagnosis not present

## 2020-05-14 ENCOUNTER — Encounter: Payer: Self-pay | Admitting: Adult Health

## 2020-05-14 ENCOUNTER — Non-Acute Institutional Stay (SKILLED_NURSING_FACILITY): Payer: PPO | Admitting: Adult Health

## 2020-05-14 DIAGNOSIS — E1122 Type 2 diabetes mellitus with diabetic chronic kidney disease: Secondary | ICD-10-CM

## 2020-05-14 DIAGNOSIS — N184 Chronic kidney disease, stage 4 (severe): Secondary | ICD-10-CM

## 2020-05-14 DIAGNOSIS — S72142D Displaced intertrochanteric fracture of left femur, subsequent encounter for closed fracture with routine healing: Secondary | ICD-10-CM

## 2020-05-14 DIAGNOSIS — I129 Hypertensive chronic kidney disease with stage 1 through stage 4 chronic kidney disease, or unspecified chronic kidney disease: Secondary | ICD-10-CM | POA: Diagnosis not present

## 2020-05-14 NOTE — Progress Notes (Signed)
Location:   [facility] Room Number: 145/D Place of Service:  SNF (31)    CODE STATUS: dnr  Allergies  Allergen Reactions  . Metformin     This allergy is not listed under records with [facility] facility. Past records states that patient "felt poor" as a reaction to taking this medication    Chief Complaint  Patient presents with  . Acute Visit    Care Plan Meeting    HPI:  She is being discharged back to her assisted living. She will not need any dme; her medications will be provided by the receiving facility. Her medical follow up will be set up at the assisted living. She will not need home health.  She was hospitalized for a left femur fracture. She was admitted to this facility for short term rehab; she is back near her baseline and is ready for discharge.    Past Medical History:  Diagnosis Date  . Achilles tendinitis   . Allergic rhinitis   . Anemia of chronic disease    at least back to 2010  . Anxiety   . Benign paroxysmal positional vertigo   . Cataract   . Cerebrovascular accident ([place])   . Chronic kidney disease (CKD), stage III (moderate)   . Depression   . DJD (degenerative joint disease)   . DM (diabetes mellitus) ([place])    type II  . GERD (gastroesophageal reflux disease)   . HTN (hypertension)   . HX: breast cancer   . Hyperlipidemia   . Hypertonicity of bladder   . Knee pain, left   . Leg pain   . Lymphocytic colitis 2008   treated with Lialda short-term  . Memory loss   . Obesity   . Osteoporosis, unspecified   . Renal disease    stage III  . Unspecified fall   . Unspecified glaucoma(365.9)     Past Surgical History:  Procedure Laterality Date  . Bladder tack  2000  . BREAST LUMPECTOMY  2004   right  . cataracts    . CHOLECYSTECTOMY  1960  . COLONOSCOPY  07/2007   lymphocytic colitis  . ESOPHAGOGASTRODUODENOSCOPY  06/24/2011   Procedure: ESOPHAGOGASTRODUODENOSCOPY (EGD);  Surgeon: Dorothyann Peng, MD;  [facility];  Service: Endoscopy;  Laterality: N/A;.  Peptic stricture, mild erosive reflux esophagitis, hiatal  . FEMUR IM NAIL Left 04/28/2020   Procedure: INTRAMEDULLARY (IM) NAIL FEMORAL;  Surgeon: Marchia Bond, MD;  Location: [facility];  Service: Orthopedics;  Laterality: Left;  . FLEXIBLE SIGMOIDOSCOPY  11/25/2011   MODERATE DIVERTICULOSIS/INTERNAL HEMORRHOIDS  . S/P Hysterectomy  2000   benign reasons  . SAVORY DILATION  06/24/2011   Procedure: SAVORY DILATION;  Surgeon: Dorothyann Peng, MD;  [facility];  Service: Endoscopy;  Laterality: N/A;    Social History   Socioeconomic History  . Marital status: Widowed    Spouse name: Not on file  . Number of children: 2  . Years of education: 4  . Highest education level: High school graduate  Occupational History  . Occupation: retired    Fish farm manager: RETIRED    Comment: [employer]  Tobacco Use  . Smoking status: Former Smoker    Packs/day: 1.00    Types: Cigarettes  . Smokeless tobacco: Never Used  . Tobacco comment: 30 yrs ago  Vaping Use  . Vaping Use: Never used  Substance and Sexual Activity  . Alcohol use: No  . Drug use: No  .  Sexual activity: Not on file  Other Topics Concern  . Not on file  Social History Narrative   Lives at [facility] in [place].   Right-handed.   2-3 cups caffeine per day.   Social Determinants of Health   Financial Resource Strain:   . Difficulty of Paying Living Expenses:   Food Insecurity:   . Worried About Charity fundraiser in the Last Year:   . Arboriculturist in the Last Year:   Transportation Needs:   . Film/video editor (Medical):   Marland Kitchen Lack of Transportation (Non-Medical):   Physical Activity:   . Days of Exercise per Week:   . Minutes of Exercise per Session:   Stress:   . Feeling of Stress :   Social Connections:   . Frequency of Communication with Friends and Family:   . Frequency of Social Gatherings with Friends and Family:   .  Attends Religious Services:   . Active Member of Clubs or Organizations:   . Attends Archivist Meetings:   Marland Kitchen Marital Status:   Intimate Partner Violence:   . Fear of Current or Ex-Partner:   . Emotionally Abused:   Marland Kitchen Physically Abused:   . Sexually Abused:    Family History  Problem Relation Age of Onset  . Arthritis Mother        died at age 81  . Other Father        died at age 68 - horse accident  . Colon cancer Neg Hx           . Liver disease Neg Hx           . GI problems Neg Hx             VITAL SIGNS BP (!) 169/78   Pulse 75   Temp 97.6 F (36.4 C) (Oral)   Resp 20   Ht 5' (1.524 m)   Wt 171 lb (77.6 kg)   BMI 33.40 kg/m   Patient's Medications  New Prescriptions   No medications on file  Previous Medications   ACETAMINOPHEN (TYLENOL) 650 MG CR TABLET    Take 650 mg by mouth every 6 (six) hours.   AMLODIPINE (NORVASC) 10 MG TABLET    Take 1 tablet (10 mg total) by mouth daily.   ASPIRIN 81 MG TABLET    Take 81 mg by mouth daily.     CHOLECALCIFEROL (VITAMIN D) 2000 UNITS CAPS    Take 1 capsule by mouth daily.   CLONAZEPAM (KLONOPIN) 0.5 MG TABLET    Take 0.5 mg by mouth every 8 (eight) hours as needed. Special Instructions: As needed for restlessness and agitation thru 05/14/20. Attempt and document a non-drug intervention and outcome prior to administering medication.   CYCLOSPORINE (RESTASIS) 0.05 % OPHTHALMIC EMULSION    Place 1 drop into both eyes 2 (two) times daily.   ENOXAPARIN (LOVENOX) 30 MG/0.3ML INJECTION    Inject 0.3 mLs (30 mg total) into the skin daily for 27 days.   EZETIMIBE (ZETIA) 10 MG TABLET    Take 10 mg by mouth daily.    FEEDING SUPPLEMENT, ENSURE ENLIVE, (ENSURE ENLIVE) LIQD    Take 237 mLs by mouth 2 (two) times daily between meals.   FERROUS SULFATE 325 (65 FE) MG TABLET    Take 1 tablet (325 mg total) by mouth 3 (three) times daily after meals.   FUROSEMIDE (LASIX) 20 MG TABLET    Take 20 mg  by mouth daily.    GLIMEPIRIDE  (AMARYL) 1 MG TABLET    Take 1 mg by mouth daily.    LATANOPROST (XALATAN) 0.005 % OPHTHALMIC SOLUTION    Place 1 drop into both eyes at bedtime.    LOPERAMIDE (IMODIUM A-D) 2 MG TABLET    Take 2 mg by mouth 4 (four) times daily as needed for diarrhea or loose stools.   METHOCARBAMOL (ROBAXIN) 500 MG TABLET    Take 1 tablet (500 mg total) by mouth every 6 (six) hours as needed for muscle spasms.   NON FORMULARY    Diet: _____ Regular, ______ NAS, ___x____Consistent Carbohydrate, _______NPO _____Other   OMEPRAZOLE (PRILOSEC) 40 MG CAPSULE    Take 40 mg by mouth daily.    ONDANSETRON (ZOFRAN) 4 MG TABLET    Take 1 tablet (4 mg total) by mouth every 6 (six) hours as needed for nausea.   POLYETHYLENE GLYCOL (MIRALAX / GLYCOLAX) 17 G PACKET    Take 17 g by mouth daily as needed for moderate constipation.   PROAIR HFA 108 (90 BASE) MCG/ACT INHALER    Inhale 2 puffs into the lungs every 6 (six) hours as needed for wheezing or shortness of breath.   SENNA-DOCUSATE (SENOKOT-S) 8.6-50 MG TABLET    Take 1 tablet by mouth at bedtime.   SITAGLIPTIN (JANUVIA) 25 MG TABLET    Take 1 tablet (25 mg total) by mouth daily.   TIMOLOL (TIMOPTIC) 0.5 % OPHTHALMIC SOLUTION    Place 1 drop into both eyes every 12 (twelve) hours.    VITAMIN B-12 (CYANOCOBALAMIN) 1000 MCG TABLET    Take 1 tablet (1,000 mcg total) by mouth daily.  Modified Medications   No medications on file  Discontinued Medications   FISH OIL-OMEGA-3 FATTY ACIDS 1000 MG CAPSULE    Take 1 g by mouth daily.      SIGNIFICANT DIAGNOSTIC EXAMS  PREVIOUS  04-27-20: left hip x-ray: Displaced and angulated intertrochanteric fracture LEFT femur.  04-27-20: chest x-ray: No acute abnormalities. Aortic Atherosclerosis   04-27-20: CT of head:  Stable atrophy with periventricular small vessel disease. Prior focal infarct in the left occipital lobe as well as lacunar type infarcts in each posterior cerebellar hemisphere. No acute infarct evident. No mass or  hemorrhage. There are foci of arterial vascular calcification. There is opacification and mucosal thickening in several ethmoid air cells.  NO NEW EXAMS.    LABS REVIEWED PREVIOUS  04-27-20: wbc 7.1; hgb 10.6; hct 34.2; mcv 91.0 plt 221; glucose 89; bun 39; creat 2.27; k+ 4.3; na++ 138; ca 8.9 liver normal albumin 3.8; hgb a1c 7.1 04-29-20: wbc 7.7; hgb 8.5; hct 28.0 mcv 92.4 plt 164; glucose 252; bun 49; creat 2.23; k+ 5.6 na++ 135; ca 8.4 04-30-20: wbc 10.4; hgb 7.1; hct 22.7; mcv 90.4 plt 162; glucose 194; bun 57; creat 2.24; k+ 4.7; na++ 134; ca 8.3 mag 2.2  05-04-20 hgb 7.6; hct 24.6 glucose 164; bun 38; creat 1.38; k+ 3.5; na++ 137; ca 8.4   NO NEW LABS.    Review of Systems  Constitutional: Negative for malaise/fatigue.  Respiratory: Negative for cough and shortness of breath.   Cardiovascular: Negative for chest pain, palpitations and leg swelling.  Gastrointestinal: Negative for abdominal pain, constipation and heartburn.  Musculoskeletal: Negative for back pain, joint pain and myalgias.  Skin: Negative.   Neurological: Negative for dizziness.  Psychiatric/Behavioral: The patient is not nervous/anxious.     Physical Exam Constitutional:      General: She  is not in acute distress.    Appearance: She is well-developed. She is not diaphoretic.  Neck:     Thyroid: No thyromegaly.  Cardiovascular:     Rate and Rhythm: Normal rate and regular rhythm.     Pulses: Normal pulses.     Heart sounds: Normal heart sounds.  Pulmonary:     Effort: Pulmonary effort is normal. No respiratory distress.     Breath sounds: Normal breath sounds.  Abdominal:     General: Bowel sounds are normal. There is no distension.     Palpations: Abdomen is soft.     Tenderness: There is no abdominal tenderness.  Musculoskeletal:     Cervical back: Neck supple.     Right lower leg: No edema.     Left lower leg: No edema.     Comments: Is able to move all extremities 04-28-20 left femur IM nail      Lymphadenopathy:     Cervical: No cervical adenopathy.  Skin:    General: Skin is warm and dry.  Neurological:     Mental Status: She is alert. Mental status is at baseline.  Psychiatric:        Mood and Affect: Mood normal.       ASSESSMENT/ PLAN:   Patient is being discharged with the following home health services:  None needed   Patient is being discharged with the following durable medical equipment:  None needed   Patient has been advised to f/u with their PCP in 1-2 weeks to bring them up to date on their rehab stay.  Social services at facility was responsible for arranging this appointment.  Pt was provided with a 30 day supply of prescriptions for medications and refills must be obtained from their PCP.  For controlled substances, a more limited supply may be provided adequate until PCP appointment only.   Her medications will be provided by the assisted living.   Ok Edwards NP [facility]  Contact [phone] Monday through Friday 8am- 5pm  After hours call [phone]

## 2020-05-20 ENCOUNTER — Other Ambulatory Visit (HOSPITAL_COMMUNITY): Payer: Self-pay | Admitting: Family Medicine

## 2020-05-20 DIAGNOSIS — Z1231 Encounter for screening mammogram for malignant neoplasm of breast: Secondary | ICD-10-CM

## 2020-05-20 DIAGNOSIS — G4700 Insomnia, unspecified: Secondary | ICD-10-CM | POA: Diagnosis not present

## 2020-05-20 DIAGNOSIS — E119 Type 2 diabetes mellitus without complications: Secondary | ICD-10-CM | POA: Diagnosis not present

## 2020-05-20 DIAGNOSIS — Z7189 Other specified counseling: Secondary | ICD-10-CM | POA: Diagnosis not present

## 2020-05-20 DIAGNOSIS — M810 Age-related osteoporosis without current pathological fracture: Secondary | ICD-10-CM

## 2020-05-20 DIAGNOSIS — S7292XD Unspecified fracture of left femur, subsequent encounter for closed fracture with routine healing: Secondary | ICD-10-CM | POA: Diagnosis not present

## 2020-05-21 DIAGNOSIS — E119 Type 2 diabetes mellitus without complications: Secondary | ICD-10-CM | POA: Diagnosis not present

## 2020-05-21 DIAGNOSIS — I10 Essential (primary) hypertension: Secondary | ICD-10-CM | POA: Diagnosis not present

## 2020-05-21 DIAGNOSIS — R7989 Other specified abnormal findings of blood chemistry: Secondary | ICD-10-CM | POA: Diagnosis not present

## 2020-05-21 DIAGNOSIS — G4700 Insomnia, unspecified: Secondary | ICD-10-CM | POA: Diagnosis not present

## 2020-05-21 DIAGNOSIS — E118 Type 2 diabetes mellitus with unspecified complications: Secondary | ICD-10-CM | POA: Diagnosis not present

## 2020-05-21 DIAGNOSIS — E559 Vitamin D deficiency, unspecified: Secondary | ICD-10-CM | POA: Diagnosis not present

## 2020-05-21 DIAGNOSIS — E7800 Pure hypercholesterolemia, unspecified: Secondary | ICD-10-CM | POA: Diagnosis not present

## 2020-05-21 DIAGNOSIS — E6609 Other obesity due to excess calories: Secondary | ICD-10-CM | POA: Diagnosis not present

## 2020-05-22 NOTE — H&P (Signed)
[facility] History & Physical  Rob Doctor, hospital MD  Nancy Medina 04/27/2020  Chief Complaint: L hip pain after fall HPI: The patient is a 86 y.o. year-old w/ hx of prior strokes, CKD III, breast cancer and DM2, memory loss fell at the nursing home today and came to ED w/ L hip pain. Xrays show left inter-trochanteric fracture.  Ortho contacted and would like pt admitted to [facility] for surgery tomorrow (full consult to follow). Asked to see for admission.   Pt seen in ED on stretcher in the hall.  States she has been "unusually health" most her life. Had GB surgery. Has diabetes. Lives at SNF and was going to exercise class this am when she fell.  Baseline mobility if walking w/ her walker.  No prior fractures she can remember.  Her daughter has POA.  She is okay w/ having the surgery to fix her hip.    Denies SOB, cough, fevers, CP, n/v/d or abd pain, no voiding issues.   ROS  denies CP  no joint pain   no HA  no blurry vision  no rash  no diarrhea  no nausea/ vomiting  no dysuria  no difficulty voiding  no change in urine color    Past Medical History  Past Medical History:  Diagnosis Date  . Achilles tendinitis   . Allergic rhinitis   . Anemia of chronic disease    at least back to 2010  . Anxiety   . Benign paroxysmal positional vertigo   . Cataract   . Cerebrovascular accident ([place])   . Chronic kidney disease (CKD), stage III (moderate)   . Depression   . DJD (degenerative joint disease)   . DM (diabetes mellitus) ([place])    type II  . GERD (gastroesophageal reflux disease)   . HTN (hypertension)   . HX: breast cancer   . Hyperlipidemia   . Hypertonicity of bladder   . Knee pain, left   . Leg pain   . Lymphocytic colitis 2008   treated with Lialda short-term  . Memory loss   . Obesity   . Osteoporosis, unspecified   . Renal disease    stage III  . Unspecified fall   . Unspecified glaucoma(365.9)    Past Surgical History  Past  Surgical History:  Procedure Laterality Date  . Bladder tack  2000  . BREAST LUMPECTOMY  2004   right  . cataracts    . CHOLECYSTECTOMY  1960  . COLONOSCOPY  07/2007   lymphocytic colitis  . ESOPHAGOGASTRODUODENOSCOPY  06/24/2011   Procedure: ESOPHAGOGASTRODUODENOSCOPY (EGD);  Surgeon: Dorothyann Peng, MD;  [facility];  Service: Endoscopy;  Laterality: N/A;.  Peptic stricture, mild erosive reflux esophagitis, hiatal  . FEMUR IM NAIL Left 04/28/2020   Procedure: INTRAMEDULLARY (IM) NAIL FEMORAL;  Surgeon: Marchia Bond, MD;  Location: [facility];  Service: Orthopedics;  Laterality: Left;  . FLEXIBLE SIGMOIDOSCOPY  11/25/2011   MODERATE DIVERTICULOSIS/INTERNAL HEMORRHOIDS  . S/P Hysterectomy  2000   benign reasons  . SAVORY DILATION  06/24/2011   Procedure: SAVORY DILATION;  Surgeon: Dorothyann Peng, MD;  [facility];  Service: Endoscopy;  Laterality: N/A;   Family History  Family History  Problem Relation Age of Onset  . Arthritis Mother        died at age 10  . Other Father        died at age 47 - horse accident  . Colon cancer Neg  Hx           . Liver disease Neg Hx           . GI problems Neg Hx            Social History  reports that she has quit smoking. Her smoking use included cigarettes. She smoked 1.00 pack per day. She has never used smokeless tobacco. She reports that she does not drink alcohol and does not use drugs. Allergies  Allergies  Allergen Reactions  . Metformin     This allergy is not listed under records with [facility] facility. Past records states that patient "felt poor" as a reaction to taking this medication   Home medications Prior to Admission medications   Medication Sig Start Date End Date Taking? Authorizing Provider  amLODipine (NORVASC) 10 MG tablet Take 1 tablet (10 mg total) by mouth daily. 07/07/18  Yes Dhungel, Nishant, MD  aspirin 81 MG tablet Take 81 mg by mouth daily.     Yes [provider]   Cholecalciferol (VITAMIN D) 2000 units CAPS Take 1 capsule by mouth daily.   Yes [provider]  cycloSPORINE (RESTASIS) 0.05 % ophthalmic emulsion Place 1 drop into both eyes 2 (two) times daily.   Yes [provider]  ezetimibe (ZETIA) 10 MG tablet Take 10 mg by mouth daily.  01/24/18  Yes [provider]  furosemide (LASIX) 20 MG tablet Take 20 mg by mouth daily.  03/07/18  Yes [provider]  glimepiride (AMARYL) 1 MG tablet Take 1 mg by mouth daily.  04/24/18  Yes [provider]  latanoprost (XALATAN) 0.005 % ophthalmic solution Place 1 drop into both eyes at bedtime.  06/02/11  Yes [provider]  loperamide (IMODIUM A-D) 2 MG tablet Take 2 mg by mouth 4 (four) times daily as needed for diarrhea or loose stools.   Yes [provider]  omeprazole (PRILOSEC) 40 MG capsule Take 40 mg by mouth daily.  01/23/18  Yes [provider]  sitaGLIPtin (JANUVIA) 25 MG tablet Take 1 tablet (25 mg total) by mouth daily. 03/20/18  Yes Tat, Shanon Brow, MD  timolol (TIMOPTIC) 0.5 % ophthalmic solution Place 1 drop into both eyes every 12 (twelve) hours.  01/17/18  Yes [provider]  vitamin B-12 (CYANOCOBALAMIN) 1000 MCG tablet Take 1 tablet (1,000 mcg total) by mouth daily. 03/20/18  Yes Tat, Shanon Brow, MD  acetaminophen (TYLENOL) 650 MG CR tablet Take 650 mg by mouth every 6 (six) hours. 05/05/20   [provider]  clonazePAM (KLONOPIN) 0.5 MG tablet Take 0.5 mg by mouth every 8 (eight) hours as needed. Special Instructions: As needed for restlessness and agitation thru 05/14/20. Attempt and document a non-drug intervention and outcome prior to administering medication. 04/30/20 05/14/20  [provider]  enoxaparin (LOVENOX) 30 MG/0.3ML injection Inject 0.3 mLs (30 mg total) into the skin daily for 27 days. 05/01/20 05/28/20  Pokhrel, Corrie Mckusick, MD  feeding supplement, ENSURE ENLIVE, (ENSURE ENLIVE) LIQD Take 237 mLs by mouth 2 (two)  times daily between meals. 04/30/20   Pokhrel, Corrie Mckusick, MD  ferrous sulfate 325 (65 FE) MG tablet Take 1 tablet (325 mg total) by mouth 3 (three) times daily after meals. 04/30/20 10/27/20  Pokhrel, Corrie Mckusick, MD  methocarbamol (ROBAXIN) 500 MG tablet Take 1 tablet (500 mg total) by mouth every 6 (six) hours as needed for muscle spasms. 04/30/20   Pokhrel, Corrie Mckusick, MD  NON FORMULARY Diet: _____ Regular, ______ NAS, ___x____Consistent Carbohydrate,  _______NPO _____Other    [provider]  ondansetron (ZOFRAN) 4 MG tablet Take 1 tablet (4 mg total) by mouth every 6 (six) hours as needed for nausea. 04/30/20   Pokhrel, Corrie Mckusick, MD  polyethylene glycol (MIRALAX / GLYCOLAX) 17 g packet Take 17 g by mouth daily as needed for moderate constipation. 04/30/20   Pokhrel, Corrie Mckusick, MD  PROAIR HFA 108 (90 Base) MCG/ACT inhaler Inhale 2 puffs into the lungs every 6 (six) hours as needed for wheezing or shortness of breath. 04/30/20   Pokhrel, Laxman, MD  senna-docusate (SENOKOT-S) 8.6-50 MG tablet Take 1 tablet by mouth at bedtime. 04/30/20   Pokhrel, Corrie Mckusick, MD     Exam Gen alert, pleasant elderly WF not in distress No rash, cyanosis or gangrene Sclera anicteric, throat clear  No jvd or bruits Chest clear bilat to bases no rales, wheezing or bronchial BS RRR no MRG Abd soft ntnd no mass or ascites +bs GU defer MS LLE is rotated out, no wounds or ulcers Ext no LE edema Neuro is alert, nonfocal, mild gen'd weakness  doesn't know year, knows is in hospital, thought was in [place] but is in [place] meds:  - norvasc 5/ lasix 20/ zetia 10 qd/ asa 81 mg  - sitagliptin 25 qd/ glimepiride 47m qd  - clonazepam 178mhs and 56m756mh tid prn  - prilosec 40  - prn robaxin/ antivert/ proair/ norco  - prn's/ vitamins/ supplements/ eyedrops    L hip xray 6/7 -- IMPRESSION: Displaced and angulated intertrochanteric fracture LEFT femur   CXR - no acute disease   CT head - IMPRESSION: Stable atrophy with  periventricular small vessel disease. Prior focal infarct in the left occipital lobe as well as lacunar type infarcts in each posterior cerebellar hemisphere. No acute infarct evident. No mass or hemorrhage.  There are foci of arterial vascular calcification. There is opacification and mucosal thickening in several ethmoid air cells.    B/L creat - 2019  > 1.60- 2.41, egfr 16- 27 ml/min                     - march 2020 >  2.01, egfr 21                     - today  2.27    Assessment/ Plan: 1. Fall/ L hip fracture - in lady w/ CKD IV, DM and mild HTN , no hx cardiac diseases. Ambulatory at baseline w/ a walker. Per ortho plan is for admit to [facility] after MN, and surgery tomorrow. Have d/w pt's POA her dtr who consents in general to the current plan. She is [place] her way [place] the hospital [place]. DVT proph - will give 1 dose SQ hep 5000 now, then post-op DVT proph to be determined by orthopedic service 3. CKD IV - b/l creat 2.01 in march 2020, 2.2 here today.  Stable, vol okay, K+ wnl 4. DM2 - on oral agents at home, plan SSI for now qid 5. HTN/ vol  - cont norvasc, hold lasix for now, no vol excess on exam today 6. HL - home meds 7. Memory loss - per hx 8. H/o CVA's - head CT confirms prior CVA's     RobKelly SplinterD   [facility] 04/27/2020, 3:57 PM

## 2020-06-02 DIAGNOSIS — M79675 Pain in left toe(s): Secondary | ICD-10-CM | POA: Diagnosis not present

## 2020-06-02 DIAGNOSIS — B351 Tinea unguium: Secondary | ICD-10-CM | POA: Diagnosis not present

## 2020-06-03 DIAGNOSIS — R7989 Other specified abnormal findings of blood chemistry: Secondary | ICD-10-CM | POA: Diagnosis not present

## 2020-06-03 DIAGNOSIS — R799 Abnormal finding of blood chemistry, unspecified: Secondary | ICD-10-CM | POA: Diagnosis not present

## 2020-06-03 DIAGNOSIS — G4700 Insomnia, unspecified: Secondary | ICD-10-CM | POA: Diagnosis not present

## 2020-06-03 DIAGNOSIS — R600 Localized edema: Secondary | ICD-10-CM | POA: Diagnosis not present

## 2020-06-03 DIAGNOSIS — E7800 Pure hypercholesterolemia, unspecified: Secondary | ICD-10-CM | POA: Diagnosis not present

## 2020-06-03 DIAGNOSIS — I10 Essential (primary) hypertension: Secondary | ICD-10-CM | POA: Diagnosis not present

## 2020-06-03 DIAGNOSIS — S72002S Fracture of unspecified part of neck of left femur, sequela: Secondary | ICD-10-CM | POA: Diagnosis not present

## 2020-06-03 DIAGNOSIS — E118 Type 2 diabetes mellitus with unspecified complications: Secondary | ICD-10-CM | POA: Diagnosis not present

## 2020-06-03 DIAGNOSIS — E559 Vitamin D deficiency, unspecified: Secondary | ICD-10-CM | POA: Diagnosis not present

## 2020-06-03 DIAGNOSIS — I5032 Chronic diastolic (congestive) heart failure: Secondary | ICD-10-CM | POA: Diagnosis not present

## 2020-06-08 DIAGNOSIS — S72142D Displaced intertrochanteric fracture of left femur, subsequent encounter for closed fracture with routine healing: Secondary | ICD-10-CM | POA: Diagnosis not present

## 2020-06-11 ENCOUNTER — Ambulatory Visit (HOSPITAL_COMMUNITY): Payer: PPO

## 2020-06-11 DIAGNOSIS — S72144D Nondisplaced intertrochanteric fracture of right femur, subsequent encounter for closed fracture with routine healing: Secondary | ICD-10-CM | POA: Diagnosis not present

## 2020-06-11 DIAGNOSIS — Z7984 Long term (current) use of oral hypoglycemic drugs: Secondary | ICD-10-CM | POA: Diagnosis not present

## 2020-06-11 DIAGNOSIS — R262 Difficulty in walking, not elsewhere classified: Secondary | ICD-10-CM | POA: Diagnosis not present

## 2020-06-11 DIAGNOSIS — R2243 Localized swelling, mass and lump, lower limb, bilateral: Secondary | ICD-10-CM | POA: Diagnosis not present

## 2020-06-11 DIAGNOSIS — I129 Hypertensive chronic kidney disease with stage 1 through stage 4 chronic kidney disease, or unspecified chronic kidney disease: Secondary | ICD-10-CM | POA: Diagnosis not present

## 2020-06-11 DIAGNOSIS — N1832 Chronic kidney disease, stage 3b: Secondary | ICD-10-CM | POA: Diagnosis not present

## 2020-06-11 DIAGNOSIS — M6281 Muscle weakness (generalized): Secondary | ICD-10-CM | POA: Diagnosis not present

## 2020-06-11 DIAGNOSIS — E1122 Type 2 diabetes mellitus with diabetic chronic kidney disease: Secondary | ICD-10-CM | POA: Diagnosis not present

## 2020-06-15 ENCOUNTER — Ambulatory Visit (HOSPITAL_COMMUNITY)
Admission: RE | Admit: 2020-06-15 | Discharge: 2020-06-15 | Disposition: A | Discharge status: Home / Self Care | Payer: PPO | Source: Ambulatory Visit | Attending: Family Medicine | Admitting: Family Medicine

## 2020-06-15 ENCOUNTER — Other Ambulatory Visit: Payer: Self-pay

## 2020-06-15 DIAGNOSIS — I10 Essential (primary) hypertension: Secondary | ICD-10-CM | POA: Diagnosis not present

## 2020-06-15 DIAGNOSIS — M810 Age-related osteoporosis without current pathological fracture: Secondary | ICD-10-CM | POA: Diagnosis not present

## 2020-06-15 DIAGNOSIS — Z1231 Encounter for screening mammogram for malignant neoplasm of breast: Secondary | ICD-10-CM | POA: Insufficient documentation

## 2020-06-15 DIAGNOSIS — E559 Vitamin D deficiency, unspecified: Secondary | ICD-10-CM | POA: Diagnosis not present

## 2020-06-15 DIAGNOSIS — E7800 Pure hypercholesterolemia, unspecified: Secondary | ICD-10-CM | POA: Diagnosis not present

## 2020-06-15 DIAGNOSIS — G4700 Insomnia, unspecified: Secondary | ICD-10-CM | POA: Diagnosis not present

## 2020-06-15 DIAGNOSIS — R7989 Other specified abnormal findings of blood chemistry: Secondary | ICD-10-CM | POA: Diagnosis not present

## 2020-06-15 DIAGNOSIS — R799 Abnormal finding of blood chemistry, unspecified: Secondary | ICD-10-CM | POA: Diagnosis not present

## 2020-06-15 DIAGNOSIS — E118 Type 2 diabetes mellitus with unspecified complications: Secondary | ICD-10-CM | POA: Diagnosis not present

## 2020-06-15 DIAGNOSIS — R29890 Loss of height: Secondary | ICD-10-CM | POA: Diagnosis not present

## 2020-06-16 ENCOUNTER — Other Ambulatory Visit (HOSPITAL_COMMUNITY): Payer: PPO

## 2020-06-23 DIAGNOSIS — I129 Hypertensive chronic kidney disease with stage 1 through stage 4 chronic kidney disease, or unspecified chronic kidney disease: Secondary | ICD-10-CM | POA: Diagnosis not present

## 2020-06-23 DIAGNOSIS — Z7984 Long term (current) use of oral hypoglycemic drugs: Secondary | ICD-10-CM | POA: Diagnosis not present

## 2020-06-23 DIAGNOSIS — R2243 Localized swelling, mass and lump, lower limb, bilateral: Secondary | ICD-10-CM | POA: Diagnosis not present

## 2020-06-23 DIAGNOSIS — R262 Difficulty in walking, not elsewhere classified: Secondary | ICD-10-CM | POA: Diagnosis not present

## 2020-06-23 DIAGNOSIS — E1122 Type 2 diabetes mellitus with diabetic chronic kidney disease: Secondary | ICD-10-CM | POA: Diagnosis not present

## 2020-06-23 DIAGNOSIS — S72144D Nondisplaced intertrochanteric fracture of right femur, subsequent encounter for closed fracture with routine healing: Secondary | ICD-10-CM | POA: Diagnosis not present

## 2020-06-23 DIAGNOSIS — N1832 Chronic kidney disease, stage 3b: Secondary | ICD-10-CM | POA: Diagnosis not present

## 2020-06-23 DIAGNOSIS — M6281 Muscle weakness (generalized): Secondary | ICD-10-CM | POA: Diagnosis not present

## 2020-07-03 DIAGNOSIS — E119 Type 2 diabetes mellitus without complications: Secondary | ICD-10-CM | POA: Diagnosis not present

## 2020-07-03 DIAGNOSIS — R799 Abnormal finding of blood chemistry, unspecified: Secondary | ICD-10-CM | POA: Diagnosis not present

## 2020-07-03 DIAGNOSIS — I340 Nonrheumatic mitral (valve) insufficiency: Secondary | ICD-10-CM | POA: Diagnosis not present

## 2020-07-03 DIAGNOSIS — R7989 Other specified abnormal findings of blood chemistry: Secondary | ICD-10-CM | POA: Diagnosis not present

## 2020-07-03 DIAGNOSIS — N183 Chronic kidney disease, stage 3 unspecified: Secondary | ICD-10-CM | POA: Diagnosis not present

## 2020-07-03 DIAGNOSIS — E7800 Pure hypercholesterolemia, unspecified: Secondary | ICD-10-CM | POA: Diagnosis not present

## 2020-07-03 DIAGNOSIS — M810 Age-related osteoporosis without current pathological fracture: Secondary | ICD-10-CM | POA: Diagnosis not present

## 2020-07-03 DIAGNOSIS — E6609 Other obesity due to excess calories: Secondary | ICD-10-CM | POA: Diagnosis not present

## 2020-07-03 DIAGNOSIS — I10 Essential (primary) hypertension: Secondary | ICD-10-CM | POA: Diagnosis not present

## 2020-07-03 DIAGNOSIS — D649 Anemia, unspecified: Secondary | ICD-10-CM | POA: Diagnosis not present

## 2020-07-03 DIAGNOSIS — E559 Vitamin D deficiency, unspecified: Secondary | ICD-10-CM | POA: Diagnosis not present

## 2020-07-03 DIAGNOSIS — G4700 Insomnia, unspecified: Secondary | ICD-10-CM | POA: Diagnosis not present

## 2020-07-03 DIAGNOSIS — E118 Type 2 diabetes mellitus with unspecified complications: Secondary | ICD-10-CM | POA: Diagnosis not present

## 2020-07-08 DIAGNOSIS — S72142D Displaced intertrochanteric fracture of left femur, subsequent encounter for closed fracture with routine healing: Secondary | ICD-10-CM | POA: Diagnosis not present

## 2020-07-15 DIAGNOSIS — H401134 Primary open-angle glaucoma, bilateral, indeterminate stage: Secondary | ICD-10-CM | POA: Diagnosis not present

## 2020-07-28 ENCOUNTER — Encounter (HOSPITAL_COMMUNITY): Payer: Self-pay | Admitting: *Deleted

## 2020-07-28 ENCOUNTER — Emergency Department (HOSPITAL_COMMUNITY): Payer: PPO

## 2020-07-28 ENCOUNTER — Emergency Department (HOSPITAL_COMMUNITY)
Admission: EM | Admit: 2020-07-28 | Discharge: 2020-07-28 | Disposition: A | Discharge status: Home / Self Care | Payer: PPO | Attending: Emergency Medicine | Admitting: Emergency Medicine

## 2020-07-28 ENCOUNTER — Other Ambulatory Visit: Payer: Self-pay

## 2020-07-28 DIAGNOSIS — N3001 Acute cystitis with hematuria: Secondary | ICD-10-CM | POA: Insufficient documentation

## 2020-07-28 DIAGNOSIS — Z7982 Long term (current) use of aspirin: Secondary | ICD-10-CM | POA: Insufficient documentation

## 2020-07-28 DIAGNOSIS — R410 Disorientation, unspecified: Secondary | ICD-10-CM | POA: Diagnosis not present

## 2020-07-28 DIAGNOSIS — N184 Chronic kidney disease, stage 4 (severe): Secondary | ICD-10-CM | POA: Diagnosis not present

## 2020-07-28 DIAGNOSIS — Z79899 Other long term (current) drug therapy: Secondary | ICD-10-CM | POA: Insufficient documentation

## 2020-07-28 DIAGNOSIS — E1165 Type 2 diabetes mellitus with hyperglycemia: Secondary | ICD-10-CM | POA: Diagnosis not present

## 2020-07-28 DIAGNOSIS — Z87891 Personal history of nicotine dependence: Secondary | ICD-10-CM | POA: Insufficient documentation

## 2020-07-28 DIAGNOSIS — I6782 Cerebral ischemia: Secondary | ICD-10-CM | POA: Diagnosis not present

## 2020-07-28 DIAGNOSIS — I129 Hypertensive chronic kidney disease with stage 1 through stage 4 chronic kidney disease, or unspecified chronic kidney disease: Secondary | ICD-10-CM | POA: Diagnosis not present

## 2020-07-28 DIAGNOSIS — E1122 Type 2 diabetes mellitus with diabetic chronic kidney disease: Secondary | ICD-10-CM | POA: Diagnosis not present

## 2020-07-28 DIAGNOSIS — Z7984 Long term (current) use of oral hypoglycemic drugs: Secondary | ICD-10-CM | POA: Insufficient documentation

## 2020-07-28 DIAGNOSIS — I6389 Other cerebral infarction: Secondary | ICD-10-CM | POA: Diagnosis not present

## 2020-07-28 DIAGNOSIS — G319 Degenerative disease of nervous system, unspecified: Secondary | ICD-10-CM | POA: Diagnosis not present

## 2020-07-28 DIAGNOSIS — R4182 Altered mental status, unspecified: Secondary | ICD-10-CM | POA: Diagnosis present

## 2020-07-28 LAB — COMPREHENSIVE METABOLIC PANEL
ALT: 9 U/L (ref 0–44)
AST: 10 U/L — ABNORMAL LOW (ref 15–41)
Albumin: 3.8 g/dL (ref 3.5–5.0)
Alkaline Phosphatase: 94 U/L (ref 38–126)
Anion gap: 12 (ref 5–15)
BUN: 22 mg/dL (ref 8–23)
CO2: 22 mmol/L (ref 22–32)
Calcium: 8 mg/dL — ABNORMAL LOW (ref 8.9–10.3)
Chloride: 107 mmol/L (ref 98–111)
Creatinine, Ser: 1.28 mg/dL — ABNORMAL HIGH (ref 0.44–1.00)
GFR calc Af Amer: 42 mL/min — ABNORMAL LOW (ref >60)
GFR calc non Af Amer: 36 mL/min — ABNORMAL LOW (ref >60)
Glucose, Bld: 125 mg/dL — ABNORMAL HIGH (ref 70–99)
Potassium: 3.5 mmol/L (ref 3.5–5.1)
Sodium: 141 mmol/L (ref 135–145)
Total Bilirubin: 0.8 mg/dL (ref 0.3–1.2)
Total Protein: 6.6 g/dL (ref 6.5–8.1)

## 2020-07-28 LAB — URINALYSIS, ROUTINE W REFLEX MICROSCOPIC
Bilirubin Urine: NEGATIVE
Glucose, UA: NEGATIVE mg/dL
Ketones, ur: NEGATIVE mg/dL
Nitrite: NEGATIVE
Protein, ur: 100 mg/dL — AB
Specific Gravity, Urine: 1.011 (ref 1.005–1.030)
pH: 5 (ref 5.0–8.0)

## 2020-07-28 LAB — CBC WITH DIFFERENTIAL/PLATELET
Abs Immature Granulocytes: 0.02 10*3/uL (ref 0.00–0.07)
Basophils Absolute: 0 10*3/uL (ref 0.0–0.1)
Basophils Relative: 1 %
Eosinophils Absolute: 0.3 10*3/uL (ref 0.0–0.5)
Eosinophils Relative: 6 %
HCT: 39.1 % (ref 36.0–46.0)
Hemoglobin: 12.2 g/dL (ref 12.0–15.0)
Immature Granulocytes: 0 %
Lymphocytes Relative: 29 %
Lymphs Abs: 1.6 10*3/uL (ref 0.7–4.0)
MCH: 29.7 pg (ref 26.0–34.0)
MCHC: 31.2 g/dL (ref 30.0–36.0)
MCV: 95.1 fL (ref 80.0–100.0)
Monocytes Absolute: 0.7 10*3/uL (ref 0.1–1.0)
Monocytes Relative: 13 %
Neutro Abs: 2.7 10*3/uL (ref 1.7–7.7)
Neutrophils Relative %: 51 %
Platelets: 234 10*3/uL (ref 150–400)
RBC: 4.11 MIL/uL (ref 3.87–5.11)
RDW: 13.8 % (ref 11.5–15.5)
WBC: 5.3 10*3/uL (ref 4.0–10.5)
nRBC: 0 % (ref 0.0–0.2)

## 2020-07-28 LAB — CBG MONITORING, ED: Glucose-Capillary: 121 mg/dL — ABNORMAL HIGH (ref 70–99)

## 2020-07-28 MED ORDER — LIDOCAINE HCL (PF) 2 % IJ SOLN
INTRAMUSCULAR | Status: AC
  Administered 2020-07-28: 1 mL
  Filled 2020-07-28: qty 10

## 2020-07-28 MED ORDER — SODIUM CHLORIDE 0.9 % IV SOLN
1.0000 g | Freq: Once | INTRAVENOUS | Status: DC
  Filled 2020-07-28: qty 10

## 2020-07-28 MED ORDER — CEPHALEXIN 500 MG PO CAPS: 500.0000 mg | ORAL_CAPSULE | Freq: Four times a day (QID) | ORAL | 0 refills | Status: DC

## 2020-07-28 MED ORDER — CEFTRIAXONE SODIUM 1 G IJ SOLR
1.0000 g | Freq: Once | INTRAMUSCULAR | Status: AC
  Administered 2020-07-28: 1 g via INTRAMUSCULAR
  Filled 2020-07-28: qty 10

## 2020-07-28 NOTE — Discharge Instructions (Addendum)
Follow up with your md this week. °

## 2020-07-28 NOTE — ED Provider Notes (Signed)
[facility] EMERGENCY DEPARTMENT Provider Note   CSN: [id number] Arrival date & time: 07/28/20  1156     History Chief Complaint  Patient presents with  . Altered Mental Status    Nancy Medina is a 86 y.o. female.  Patient stays in assisting living and she has been a little bit more confused than normal.  No other symptoms.  The history is provided by the patient and a relative. No language interpreter was used.  Altered Mental Status Presenting symptoms: disorientation   Severity:  Mild Most recent episode:  Today Episode history:  Multiple Timing:  Constant Progression:  Improving Chronicity:  New Context: not alcohol use   Associated symptoms: no abdominal pain, no hallucinations, no headaches, no rash and no seizures        Past Medical History:  Diagnosis Date  . Achilles tendinitis   . Allergic rhinitis   . Anemia of chronic disease    at least back to 2010  . Anxiety   . Benign paroxysmal positional vertigo   . Cataract   . Cerebrovascular accident ([place])   . Chronic kidney disease (CKD), stage III (moderate)   . Depression   . DJD (degenerative joint disease)   . DM (diabetes mellitus) ([place])    type II  . GERD (gastroesophageal reflux disease)   . HTN (hypertension)   . HX: breast cancer   . Hyperlipidemia   . Hypertonicity of bladder   . Knee pain, left   . Leg pain   . Lymphocytic colitis 2008   treated with Lialda short-term  . Memory loss   . Obesity   . Osteoporosis, unspecified   . Renal disease    stage III  . Unspecified fall   . Unspecified glaucoma(365.9)     Patient Active Problem List   Diagnosis Date Noted  . Hypertension associated with stage 4 chronic kidney disease due to type 2 diabetes mellitus ([place]) 05/01/2020  . Hyperlipidemia associated with type 2 diabetes mellitus ([place]) 05/01/2020  . CKD stage 4 due to type 2 diabetes mellitus ([place]) 05/01/2020  . Acute blood loss anemia 05/01/2020  . Bilateral lower extremity  edema 05/01/2020  . Increased intraocular pressure, bilateral 05/01/2020  . Chronic constipation 05/01/2020  . Displaced intertrochanteric fracture of left femur, subsequent encounter for closed fracture with routine healing 04/27/2020  . Gait abnormality 09/13/2018  . Mild cognitive impairment 09/13/2018  . Uncontrolled hypertension 07/06/2018  . Fall 07/04/2018  . Scalp laceration 07/04/2018  . Acute bronchitis due to Rhinovirus 03/20/2018  . Diabetic hyperosmolar non-ketotic state ([place]) 03/18/2018  . Acute respiratory failure with hypoxia ([place]) 03/18/2018  . CKD (chronic kidney disease) stage 4, GFR 15-29 ml/min ([facility]) 03/18/2018  . Acute lower UTI 02/09/2018  . Nausea 02/09/2018  . Constipation 02/09/2018  . Diarrhea 11/25/2011  . Lymphocytic colitis 06/15/2011  . Esophageal dysphagia 06/15/2011  . UNSPECIFIED ANEMIA 04/27/2009  . ACHILLES TENDINITIS 10/27/2008  . PERIPHERAL EDEMA 10/14/2008  . DEGENERATIVE DISC DISEASE 01/15/2008  . Type 2 diabetes mellitus with hyperlipidemia ([place]) 12/13/2007  . HYPERLIPIDEMIA 10/11/2006  . OBESITY NOS 10/11/2006  . Anxiety 10/11/2006  . Depression 10/11/2006  . GLAUCOMA NOS 10/11/2006  . BENIGN POSITIONAL VERTIGO 10/11/2006  . Essential hypertension 10/11/2006  . GERD 10/11/2006  . OVERACTIVE BLADDER 10/11/2006  . OSTEOPOROSIS 10/11/2006  . BREAST CANCER, HX OF 10/11/2006  . CEREBROVASCULAR ACCIDENT, HX OF 10/11/2006    Past Surgical History:  Procedure Laterality Date  . Bladder tack  2000  .  BREAST LUMPECTOMY  2004   right  . cataracts    . CHOLECYSTECTOMY  1960  . COLONOSCOPY  07/2007   lymphocytic colitis  . ESOPHAGOGASTRODUODENOSCOPY  06/24/2011   Procedure: ESOPHAGOGASTRODUODENOSCOPY (EGD);  Surgeon: Dorothyann Peng, MD;  [facility];  Service: Endoscopy;  Laterality: N/A;.  Peptic stricture, mild erosive reflux esophagitis, hiatal  . FEMUR IM NAIL Left 04/28/2020   Procedure: INTRAMEDULLARY (IM) NAIL FEMORAL;   Surgeon: Marchia Bond, MD;  [facility]: [facility];  Service: Orthopedics;  Laterality: Left;  . FLEXIBLE SIGMOIDOSCOPY  11/25/2011   MODERATE DIVERTICULOSIS/INTERNAL HEMORRHOIDS  . S/P Hysterectomy  2000   benign reasons  . SAVORY DILATION  06/24/2011   Procedure: SAVORY DILATION;  Surgeon: Dorothyann Peng, MD;  [facility];  Service: Endoscopy;  Laterality: N/A;     OB History   No obstetric history on file.     Family History  Problem Relation Age of Onset  . Arthritis Mother        died at age 71  . Other Father        died at age 71 - horse accident  . Colon cancer Neg Hx           . Liver disease Neg Hx           . GI problems Neg Hx             Social History   Tobacco Use  . Smoking status: Former Smoker    Packs/day: 1.00    Types: Cigarettes  . Smokeless tobacco: Never Used  . Tobacco comment: 30 yrs ago  Vaping Use  . Vaping Use: Never used  Substance Use Topics  . Alcohol use: No  . Drug use: No    Home Medications Prior to Admission medications   Medication Sig Start Date End Date Taking? Authorizing Provider  acetaminophen (TYLENOL) 650 MG CR tablet Take 650 mg by mouth every 6 (six) hours. 05/05/20  Yes [provider]  amLODipine (NORVASC) 10 MG tablet Take 1 tablet (10 mg total) by mouth daily. 07/07/18  Yes Dhungel, Nishant, MD  aspirin 81 MG tablet Take 81 mg by mouth daily.     Yes [provider]  Cholecalciferol (VITAMIN D) 2000 units CAPS Take 1 capsule by mouth daily.   Yes [provider]  clonazePAM (KLONOPIN) 1 MG tablet Take 1 mg by mouth at bedtime. Special Instructions: As needed for restlessness and agitation thru 05/14/20. Attempt and document a non-drug intervention and outcome prior to administering medication.  04/30/20 07/28/20 Yes [provider]  cycloSPORINE (RESTASIS) 0.05 % ophthalmic emulsion Place 1 drop into both eyes 2 (two) times daily.   Yes [provider]  dorzolamide-timolol  (COSOPT) 22.3-6.8 MG/ML ophthalmic solution Place 1 drop into both eyes in the morning and at bedtime. 07/15/20  Yes [provider]  ezetimibe (ZETIA) 10 MG tablet Take 10 mg by mouth daily.  01/24/18  Yes [provider]  feeding supplement, ENSURE ENLIVE, (ENSURE ENLIVE) LIQD Take 237 mLs by mouth 2 (two) times daily between meals. 04/30/20  Yes Pokhrel, Laxman, MD  ferrous sulfate 325 (65 FE) MG tablet Take 1 tablet (325 mg total) by mouth 3 (three) times daily after meals. 04/30/20 10/27/20 Yes Pokhrel, Laxman, MD  furosemide (LASIX) 20 MG tablet Take 20 mg by mouth daily.  03/07/18  Yes [provider]  glimepiride (AMARYL) 1 MG tablet Take 1 mg by mouth daily.  04/24/18  Yes [provider]  latanoprost (XALATAN) 0.005 % ophthalmic solution Place 1 drop into both eyes at bedtime.  06/02/11  Yes [provider]  methocarbamol (ROBAXIN) 500 MG tablet Take 1 tablet (500 mg total) by mouth every 6 (six) hours as needed for muscle spasms. 04/30/20  Yes Pokhrel, Corrie Mckusick, MD  NON FORMULARY Diet: _____ Regular, ______ NAS, ___x____Consistent Carbohydrate, _______NPO _____Other   Yes [provider]  omeprazole (PRILOSEC) 40 MG capsule Take 40 mg by mouth daily.  01/23/18  Yes [provider]  senna-docusate (SENOKOT-S) 8.6-50 MG tablet Take 1 tablet by mouth at bedtime. 04/30/20  Yes Pokhrel, Laxman, MD  sitaGLIPtin (JANUVIA) 25 MG tablet Take 1 tablet (25 mg total) by mouth daily. 03/20/18  Yes Tat, Shanon Brow, MD  vitamin B-12 (CYANOCOBALAMIN) 1000 MCG tablet Take 1 tablet (1,000 mcg total) by mouth daily. 03/20/18  Yes Tat, Shanon Brow, MD  cephALEXin (KEFLEX) 500 MG capsule Take 1 capsule (500 mg total) by mouth 4 (four) times daily. 07/28/20   Milton Ferguson, MD  enoxaparin (LOVENOX) 30 MG/0.3ML injection Inject 0.3 mLs (30 mg total) into the skin daily for 27 days. 05/01/20 05/28/20  Pokhrel, Corrie Mckusick, MD  loperamide (IMODIUM A-D) 2 MG tablet Take 2 mg by mouth 4  (four) times daily as needed for diarrhea or loose stools. Patient not taking: Reported on 07/28/2020    [provider]  ondansetron (ZOFRAN) 4 MG tablet Take 1 tablet (4 mg total) by mouth every 6 (six) hours as needed for nausea. Patient not taking: Reported on 07/28/2020 04/30/20   Pokhrel, Corrie Mckusick, MD  polyethylene glycol (MIRALAX / GLYCOLAX) 17 g packet Take 17 g by mouth daily as needed for moderate constipation. Patient not taking: Reported on 07/28/2020 04/30/20   Flora Lipps, MD  PROAIR HFA 108 [phone] Base) MCG/ACT inhaler Inhale 2 puffs into the lungs every 6 (six) hours as needed for wheezing or shortness of breath. Patient not taking: Reported on 07/28/2020 04/30/20   Flora Lipps, MD    Allergies    Metformin  Review of Systems   Review of Systems  Constitutional: Negative for appetite change and fatigue.  HENT: Negative for congestion, ear discharge and sinus pressure.   Eyes: Negative for discharge.  Respiratory: Negative for cough.   Cardiovascular: Negative for chest pain.  Gastrointestinal: Negative for abdominal pain and diarrhea.  Genitourinary: Negative for frequency and hematuria.  Musculoskeletal: Negative for back pain.  Skin: Negative for rash.  Neurological: Negative for seizures and headaches.  Psychiatric/Behavioral: Negative for hallucinations.    Physical Exam Updated Vital Signs BP (!) 166/89   Pulse 67   Temp 98.4 F (36.9 C) (Oral)   Resp 13   Ht 5\' 2"  (1.575 m)   Wt 77.1 kg   SpO2 95%   BMI 31.09 kg/m   Physical Exam Vitals and nursing note reviewed.  Constitutional:      Appearance: She is well-developed.  HENT:     Head: Normocephalic.     Nose: Nose normal.  Eyes:     General: No scleral icterus.    Conjunctiva/sclera: Conjunctivae normal.  Neck:     Thyroid: No thyromegaly.  Cardiovascular:     Rate and Rhythm: Normal rate and regular rhythm.     Heart sounds: No murmur heard.  No friction rub. No gallop.   Pulmonary:      Breath sounds: No stridor. No wheezing or rales.  Chest:     Chest wall: No tenderness.  Abdominal:     General: There is no distension.     Tenderness: There is no abdominal tenderness. There is no rebound.  Musculoskeletal:        General: Normal range of motion.     Cervical back: Neck supple.  Lymphadenopathy:     Cervical: No cervical adenopathy.  Skin:    Findings: No erythema or rash.  Neurological:     Mental Status: She is alert and oriented to person, place, and time.     Motor: No abnormal muscle tone.     Coordination: Coordination normal.  Psychiatric:        Behavior: Behavior normal.     ED Results / Procedures / Treatments   Labs (all labs ordered are listed, but only abnormal results are displayed) Labs Reviewed  URINALYSIS, ROUTINE W REFLEX MICROSCOPIC - Abnormal; Notable for the following components:      Result Value   APPearance HAZY (*)    Hgb urine dipstick SMALL (*)    Protein, ur 100 (*)    Leukocytes,Ua MODERATE (*)    Bacteria, UA RARE (*)    All other components within normal limits  COMPREHENSIVE METABOLIC PANEL - Abnormal; Notable for the following components:   Glucose, Bld 125 (*)    Creatinine, Ser 1.28 (*)    Calcium 8.0 (*)    AST 10 (*)    GFR calc non Af Amer 36 (*)    GFR calc Af Amer 42 (*)    All other components within normal limits  CBG MONITORING, ED - Abnormal; Notable for the following components:   Glucose-Capillary 121 (*)    All other components within normal limits  URINE CULTURE  CBC WITH DIFFERENTIAL/PLATELET    EKG None  Radiology CT Head Wo Contrast  Result Date: 07/28/2020 CLINICAL DATA:  Cerebral hemorrhage suspected. Additional provided: Confusion for the last 2-3 days. EXAM: CT HEAD WITHOUT CONTRAST TECHNIQUE: Contiguous axial images were obtained from the base of the skull through the vertex without intravenous contrast. COMPARISON:  Head CT 04/27/2020 FINDINGS: Brain: Stable, moderate generalized  parenchymal atrophy. Redemonstrated chronic cortically based infarct within the left temporal occipital lobes. Stable background advanced ill-defined hypoattenuation within the cerebral white matter which is nonspecific, but consistent with chronic small vessel ischemic disease. Redemonstrated small chronic infarcts within both cerebellar hemispheres. There is no acute intracranial hemorrhage. No acute demarcated cortical infarct is identified. No extra-axial fluid collection. No evidence of intracranial mass. No midline shift. Vascular: No hyperdense vessel.  Atherosclerotic calcifications Skull: Normal. Negative for fracture or focal lesion. Sinuses/Orbits: Visualized orbits show no acute finding. Mild ethmoid sinus mucosal thickening at the imaged levels. No significant mastoid effusion. IMPRESSION: No CT evidence of acute intracranial abnormality. Redemonstrated chronic left temporal occipital cortically-based infarct. Redemonstrated small chronic infarcts within both cerebellar hemispheres. Stable background moderate generalized parenchymal atrophy and advanced cerebral white matter chronic small vessel ischemic disease. Mild ethmoid sinus mucosal thickening. Electronically Signed   By: Kellie Simmering DO   On: 07/28/2020 18:43    Procedures Procedures (including critical care time)  Medications Ordered in ED Medications  cefTRIAXone (ROCEPHIN) injection 1 g (has no administration in time range)    ED Course  I have reviewed the triage vital signs and the nursing notes.  Pertinent labs & imaging results that were available during my care of the patient were reviewed by me and considered in my medical decision making (see chart for details).    MDM Rules/Calculators/A&P  Patient with a urinary tract infection.  And mild confusion.  Labs and CT scan unremarkable except for UA shows infection.  Patient will be given Keflex along with Rocephin in the emergency department will  follow up this week       ,jz  This patient presents to the ED for concern of confusion, this involves an extensive number of treatment options, and is a complaint that carries with it a high risk of complications and morbidity.  The differential diagnosis includes UTI stroke   Lab Tests:   I Ordered, reviewed, and interpreted labs, which included CBC and chemistries which were unremarkable urinalysis suggest UTI.  Medicines ordered:   I ordered medication Rocephin for urinary tract infection  Imaging Studies ordered:   I ordered imaging studies which included CT head  I independently visualized and interpreted imaging which showed unremarkable  Additional history obtained:   Additional history obtained from daughter  Previous records obtained and reviewed.  Consultations Obtained:     Reevaluation:  After the interventions stated above, I reevaluated the patient and found no change  Critical Interventions:  .    Final Clinical Impression(s) / ED Diagnoses Final diagnoses:  Acute cystitis with hematuria    Rx / DC Orders ED Discharge Orders         Ordered    cephALEXin (KEFLEX) 500 MG capsule  4 times daily        07/28/20 2136           Milton Ferguson, MD 07/28/20 2155

## 2020-07-28 NOTE — ED Triage Notes (Signed)
Per daughter , patient lives in [place] , states she has been confused last 2-3 days. States [place] advised visit for evaluation

## 2020-07-29 LAB — URINE CULTURE

## 2020-08-10 DIAGNOSIS — S72142D Displaced intertrochanteric fracture of left femur, subsequent encounter for closed fracture with routine healing: Secondary | ICD-10-CM | POA: Diagnosis not present

## 2020-08-18 DIAGNOSIS — M79674 Pain in right toe(s): Secondary | ICD-10-CM | POA: Diagnosis not present

## 2020-08-18 DIAGNOSIS — M79675 Pain in left toe(s): Secondary | ICD-10-CM | POA: Diagnosis not present

## 2020-08-18 DIAGNOSIS — L600 Ingrowing nail: Secondary | ICD-10-CM | POA: Diagnosis not present

## 2020-09-04 DIAGNOSIS — H401134 Primary open-angle glaucoma, bilateral, indeterminate stage: Secondary | ICD-10-CM | POA: Diagnosis not present

## 2020-10-01 ENCOUNTER — Other Ambulatory Visit (HOSPITAL_COMMUNITY)
Admission: RE | Admit: 2020-10-01 | Discharge: 2020-10-01 | Disposition: A | Discharge status: Home / Self Care | Payer: PPO | Source: Skilled Nursing Facility | Attending: Family Medicine | Admitting: Family Medicine

## 2020-10-01 DIAGNOSIS — N390 Urinary tract infection, site not specified: Secondary | ICD-10-CM | POA: Insufficient documentation

## 2020-10-01 LAB — URINALYSIS, COMPLETE (UACMP) WITH MICROSCOPIC
Bilirubin Urine: NEGATIVE
Glucose, UA: NEGATIVE mg/dL
Ketones, ur: NEGATIVE mg/dL
Nitrite: NEGATIVE
Protein, ur: 100 mg/dL — AB
Specific Gravity, Urine: 1.011 (ref 1.005–1.030)
WBC, UA: >50 WBC/hpf — ABNORMAL HIGH (ref 0–5)
pH: 6 (ref 5.0–8.0)

## 2020-10-03 LAB — URINE CULTURE: Culture: >=100000 — AB

## 2020-10-05 DIAGNOSIS — N390 Urinary tract infection, site not specified: Secondary | ICD-10-CM | POA: Diagnosis not present

## 2020-10-05 DIAGNOSIS — I10 Essential (primary) hypertension: Secondary | ICD-10-CM | POA: Diagnosis not present

## 2020-10-05 DIAGNOSIS — E119 Type 2 diabetes mellitus without complications: Secondary | ICD-10-CM | POA: Diagnosis not present

## 2020-10-05 DIAGNOSIS — B961 Klebsiella pneumoniae [K. pneumoniae] as the cause of diseases classified elsewhere: Secondary | ICD-10-CM | POA: Diagnosis not present

## 2020-10-23 DIAGNOSIS — H401134 Primary open-angle glaucoma, bilateral, indeterminate stage: Secondary | ICD-10-CM | POA: Diagnosis not present

## 2020-11-03 DIAGNOSIS — M79674 Pain in right toe(s): Secondary | ICD-10-CM | POA: Diagnosis not present

## 2020-11-03 DIAGNOSIS — L600 Ingrowing nail: Secondary | ICD-10-CM | POA: Diagnosis not present

## 2020-11-03 DIAGNOSIS — M79675 Pain in left toe(s): Secondary | ICD-10-CM | POA: Diagnosis not present

## 2020-11-04 ENCOUNTER — Other Ambulatory Visit (HOSPITAL_COMMUNITY)
Admission: RE | Admit: 2020-11-04 | Discharge: 2020-11-04 | Disposition: A | Discharge status: Home / Self Care | Payer: PPO | Source: Other Acute Inpatient Hospital | Attending: Family Medicine | Admitting: Family Medicine

## 2020-11-04 DIAGNOSIS — N390 Urinary tract infection, site not specified: Secondary | ICD-10-CM | POA: Insufficient documentation

## 2020-11-04 LAB — URINALYSIS, ROUTINE W REFLEX MICROSCOPIC
Bilirubin Urine: NEGATIVE
Glucose, UA: NEGATIVE mg/dL
Hgb urine dipstick: NEGATIVE
Ketones, ur: NEGATIVE mg/dL
Nitrite: NEGATIVE
Protein, ur: >=300 mg/dL — AB
Specific Gravity, Urine: 1.022 (ref 1.005–1.030)
pH: 5 (ref 5.0–8.0)

## 2020-11-06 LAB — URINE CULTURE: Culture: >=100000 — AB

## 2021-01-06 DIAGNOSIS — H53461 Homonymous bilateral field defects, right side: Secondary | ICD-10-CM | POA: Diagnosis not present

## 2021-01-06 DIAGNOSIS — H353130 Nonexudative age-related macular degeneration, bilateral, stage unspecified: Secondary | ICD-10-CM | POA: Diagnosis not present

## 2021-01-06 DIAGNOSIS — H401132 Primary open-angle glaucoma, bilateral, moderate stage: Secondary | ICD-10-CM | POA: Diagnosis not present

## 2021-01-13 DIAGNOSIS — D509 Iron deficiency anemia, unspecified: Secondary | ICD-10-CM | POA: Diagnosis not present

## 2021-01-13 DIAGNOSIS — E1122 Type 2 diabetes mellitus with diabetic chronic kidney disease: Secondary | ICD-10-CM | POA: Diagnosis not present

## 2021-01-13 DIAGNOSIS — F4323 Adjustment disorder with mixed anxiety and depressed mood: Secondary | ICD-10-CM | POA: Diagnosis not present

## 2021-01-13 DIAGNOSIS — F0150 Vascular dementia without behavioral disturbance: Secondary | ICD-10-CM | POA: Diagnosis not present

## 2021-01-13 DIAGNOSIS — M810 Age-related osteoporosis without current pathological fracture: Secondary | ICD-10-CM | POA: Diagnosis not present

## 2021-01-13 DIAGNOSIS — M170 Bilateral primary osteoarthritis of knee: Secondary | ICD-10-CM | POA: Diagnosis not present

## 2021-01-13 DIAGNOSIS — I10 Essential (primary) hypertension: Secondary | ICD-10-CM | POA: Diagnosis not present

## 2021-01-13 DIAGNOSIS — N1832 Chronic kidney disease, stage 3b: Secondary | ICD-10-CM | POA: Diagnosis not present

## 2021-01-13 DIAGNOSIS — E7800 Pure hypercholesterolemia, unspecified: Secondary | ICD-10-CM | POA: Diagnosis not present

## 2021-01-13 DIAGNOSIS — I69398 Other sequelae of cerebral infarction: Secondary | ICD-10-CM | POA: Diagnosis not present

## 2021-01-13 DIAGNOSIS — K219 Gastro-esophageal reflux disease without esophagitis: Secondary | ICD-10-CM | POA: Diagnosis not present

## 2021-01-13 DIAGNOSIS — E559 Vitamin D deficiency, unspecified: Secondary | ICD-10-CM | POA: Diagnosis not present

## 2021-01-15 DIAGNOSIS — K219 Gastro-esophageal reflux disease without esophagitis: Secondary | ICD-10-CM | POA: Diagnosis not present

## 2021-01-15 DIAGNOSIS — E119 Type 2 diabetes mellitus without complications: Secondary | ICD-10-CM | POA: Diagnosis not present

## 2021-01-15 DIAGNOSIS — E559 Vitamin D deficiency, unspecified: Secondary | ICD-10-CM | POA: Diagnosis not present

## 2021-01-15 DIAGNOSIS — R6883 Chills (without fever): Secondary | ICD-10-CM | POA: Diagnosis not present

## 2021-01-19 DIAGNOSIS — F4323 Adjustment disorder with mixed anxiety and depressed mood: Secondary | ICD-10-CM | POA: Diagnosis not present

## 2021-01-19 DIAGNOSIS — M79675 Pain in left toe(s): Secondary | ICD-10-CM | POA: Diagnosis not present

## 2021-01-19 DIAGNOSIS — M79674 Pain in right toe(s): Secondary | ICD-10-CM | POA: Diagnosis not present

## 2021-01-19 DIAGNOSIS — B351 Tinea unguium: Secondary | ICD-10-CM | POA: Diagnosis not present

## 2021-01-25 ENCOUNTER — Other Ambulatory Visit (HOSPITAL_COMMUNITY)
Admission: RE | Admit: 2021-01-25 | Discharge: 2021-01-25 | Disposition: A | Discharge status: Home / Self Care | Payer: PPO | Source: Skilled Nursing Facility | Attending: Family Medicine | Admitting: Family Medicine

## 2021-01-25 DIAGNOSIS — N390 Urinary tract infection, site not specified: Secondary | ICD-10-CM | POA: Insufficient documentation

## 2021-01-25 DIAGNOSIS — B961 Klebsiella pneumoniae [K. pneumoniae] as the cause of diseases classified elsewhere: Secondary | ICD-10-CM | POA: Diagnosis not present

## 2021-01-25 LAB — URINALYSIS, MICROSCOPIC (REFLEX)

## 2021-01-25 LAB — URINALYSIS, ROUTINE W REFLEX MICROSCOPIC
Bilirubin Urine: NEGATIVE
Glucose, UA: NEGATIVE mg/dL
Ketones, ur: NEGATIVE mg/dL
Nitrite: NEGATIVE
Protein, ur: 100 mg/dL — AB
Specific Gravity, Urine: 1.02 (ref 1.005–1.030)
pH: 6 (ref 5.0–8.0)

## 2021-01-28 LAB — URINE CULTURE: Culture: >=100000 — AB

## 2021-01-29 DIAGNOSIS — N390 Urinary tract infection, site not specified: Secondary | ICD-10-CM | POA: Diagnosis not present

## 2021-02-04 DIAGNOSIS — F4323 Adjustment disorder with mixed anxiety and depressed mood: Secondary | ICD-10-CM | POA: Diagnosis not present

## 2021-02-10 DIAGNOSIS — R197 Diarrhea, unspecified: Secondary | ICD-10-CM | POA: Diagnosis not present

## 2021-02-10 DIAGNOSIS — F0150 Vascular dementia without behavioral disturbance: Secondary | ICD-10-CM | POA: Diagnosis not present

## 2021-02-10 DIAGNOSIS — I69398 Other sequelae of cerebral infarction: Secondary | ICD-10-CM | POA: Diagnosis not present

## 2021-02-10 DIAGNOSIS — D509 Iron deficiency anemia, unspecified: Secondary | ICD-10-CM | POA: Diagnosis not present

## 2021-02-10 DIAGNOSIS — M810 Age-related osteoporosis without current pathological fracture: Secondary | ICD-10-CM | POA: Diagnosis not present

## 2021-02-10 DIAGNOSIS — K219 Gastro-esophageal reflux disease without esophagitis: Secondary | ICD-10-CM | POA: Diagnosis not present

## 2021-02-10 DIAGNOSIS — E1122 Type 2 diabetes mellitus with diabetic chronic kidney disease: Secondary | ICD-10-CM | POA: Diagnosis not present

## 2021-02-10 DIAGNOSIS — M170 Bilateral primary osteoarthritis of knee: Secondary | ICD-10-CM | POA: Diagnosis not present

## 2021-02-10 DIAGNOSIS — I10 Essential (primary) hypertension: Secondary | ICD-10-CM | POA: Diagnosis not present

## 2021-02-10 DIAGNOSIS — E559 Vitamin D deficiency, unspecified: Secondary | ICD-10-CM | POA: Diagnosis not present

## 2021-02-10 DIAGNOSIS — E7800 Pure hypercholesterolemia, unspecified: Secondary | ICD-10-CM | POA: Diagnosis not present

## 2021-02-10 DIAGNOSIS — N184 Chronic kidney disease, stage 4 (severe): Secondary | ICD-10-CM | POA: Diagnosis not present

## 2021-02-15 DIAGNOSIS — H401112 Primary open-angle glaucoma, right eye, moderate stage: Secondary | ICD-10-CM | POA: Diagnosis not present

## 2021-02-15 DIAGNOSIS — H401122 Primary open-angle glaucoma, left eye, moderate stage: Secondary | ICD-10-CM | POA: Diagnosis not present

## 2021-02-15 DIAGNOSIS — H53461 Homonymous bilateral field defects, right side: Secondary | ICD-10-CM | POA: Diagnosis not present

## 2021-02-15 DIAGNOSIS — H353130 Nonexudative age-related macular degeneration, bilateral, stage unspecified: Secondary | ICD-10-CM | POA: Diagnosis not present

## 2021-02-16 DIAGNOSIS — F0150 Vascular dementia without behavioral disturbance: Secondary | ICD-10-CM | POA: Diagnosis not present

## 2021-02-16 DIAGNOSIS — F4323 Adjustment disorder with mixed anxiety and depressed mood: Secondary | ICD-10-CM | POA: Diagnosis not present

## 2021-02-19 DIAGNOSIS — E7800 Pure hypercholesterolemia, unspecified: Secondary | ICD-10-CM | POA: Diagnosis not present

## 2021-02-19 DIAGNOSIS — D509 Iron deficiency anemia, unspecified: Secondary | ICD-10-CM | POA: Diagnosis not present

## 2021-02-19 DIAGNOSIS — N184 Chronic kidney disease, stage 4 (severe): Secondary | ICD-10-CM | POA: Diagnosis not present

## 2021-02-22 DIAGNOSIS — F4323 Adjustment disorder with mixed anxiety and depressed mood: Secondary | ICD-10-CM | POA: Diagnosis not present

## 2021-02-24 DIAGNOSIS — R269 Unspecified abnormalities of gait and mobility: Secondary | ICD-10-CM | POA: Diagnosis not present

## 2021-02-24 DIAGNOSIS — M170 Bilateral primary osteoarthritis of knee: Secondary | ICD-10-CM | POA: Diagnosis not present

## 2021-02-24 DIAGNOSIS — R296 Repeated falls: Secondary | ICD-10-CM | POA: Diagnosis not present

## 2021-02-24 DIAGNOSIS — N390 Urinary tract infection, site not specified: Secondary | ICD-10-CM | POA: Diagnosis not present

## 2021-02-24 DIAGNOSIS — M810 Age-related osteoporosis without current pathological fracture: Secondary | ICD-10-CM | POA: Diagnosis not present

## 2021-02-25 ENCOUNTER — Other Ambulatory Visit (HOSPITAL_COMMUNITY)
Admission: RE | Admit: 2021-02-25 | Discharge: 2021-02-25 | Disposition: A | Discharge status: Home / Self Care | Payer: PPO | Source: Other Acute Inpatient Hospital | Attending: Family Medicine | Admitting: Family Medicine

## 2021-02-25 DIAGNOSIS — N390 Urinary tract infection, site not specified: Secondary | ICD-10-CM | POA: Diagnosis not present

## 2021-02-25 DIAGNOSIS — D509 Iron deficiency anemia, unspecified: Secondary | ICD-10-CM | POA: Diagnosis not present

## 2021-02-25 DIAGNOSIS — E1122 Type 2 diabetes mellitus with diabetic chronic kidney disease: Secondary | ICD-10-CM | POA: Diagnosis not present

## 2021-02-25 DIAGNOSIS — N184 Chronic kidney disease, stage 4 (severe): Secondary | ICD-10-CM | POA: Diagnosis not present

## 2021-02-25 DIAGNOSIS — E559 Vitamin D deficiency, unspecified: Secondary | ICD-10-CM | POA: Diagnosis not present

## 2021-02-25 DIAGNOSIS — E782 Mixed hyperlipidemia: Secondary | ICD-10-CM | POA: Diagnosis not present

## 2021-02-25 LAB — URINALYSIS, COMPLETE (UACMP) WITH MICROSCOPIC
Bilirubin Urine: NEGATIVE
Glucose, UA: NEGATIVE mg/dL
Ketones, ur: NEGATIVE mg/dL
Nitrite: NEGATIVE
Protein, ur: 100 mg/dL — AB
Specific Gravity, Urine: 1.01 (ref 1.005–1.030)
pH: 7 (ref 5.0–8.0)

## 2021-02-28 LAB — URINE CULTURE: Culture: >=100000 — AB

## 2021-03-09 DIAGNOSIS — N362 Urethral caruncle: Secondary | ICD-10-CM | POA: Diagnosis not present

## 2021-03-09 DIAGNOSIS — F4323 Adjustment disorder with mixed anxiety and depressed mood: Secondary | ICD-10-CM | POA: Diagnosis not present

## 2021-03-09 DIAGNOSIS — M170 Bilateral primary osteoarthritis of knee: Secondary | ICD-10-CM | POA: Diagnosis not present

## 2021-03-09 DIAGNOSIS — F0150 Vascular dementia without behavioral disturbance: Secondary | ICD-10-CM | POA: Diagnosis not present

## 2021-03-09 DIAGNOSIS — E1122 Type 2 diabetes mellitus with diabetic chronic kidney disease: Secondary | ICD-10-CM | POA: Diagnosis not present

## 2021-03-09 DIAGNOSIS — N390 Urinary tract infection, site not specified: Secondary | ICD-10-CM | POA: Diagnosis not present

## 2021-03-09 DIAGNOSIS — I69318 Other symptoms and signs involving cognitive functions following cerebral infarction: Secondary | ICD-10-CM | POA: Diagnosis not present

## 2021-03-09 DIAGNOSIS — K219 Gastro-esophageal reflux disease without esophagitis: Secondary | ICD-10-CM | POA: Diagnosis not present

## 2021-03-09 DIAGNOSIS — R45851 Suicidal ideations: Secondary | ICD-10-CM | POA: Diagnosis not present

## 2021-03-09 DIAGNOSIS — N184 Chronic kidney disease, stage 4 (severe): Secondary | ICD-10-CM | POA: Diagnosis not present

## 2021-03-09 DIAGNOSIS — M5136 Other intervertebral disc degeneration, lumbar region: Secondary | ICD-10-CM | POA: Diagnosis not present

## 2021-03-09 DIAGNOSIS — I129 Hypertensive chronic kidney disease with stage 1 through stage 4 chronic kidney disease, or unspecified chronic kidney disease: Secondary | ICD-10-CM | POA: Diagnosis not present

## 2021-03-09 DIAGNOSIS — M810 Age-related osteoporosis without current pathological fracture: Secondary | ICD-10-CM | POA: Diagnosis not present

## 2021-03-10 DIAGNOSIS — I69398 Other sequelae of cerebral infarction: Secondary | ICD-10-CM | POA: Diagnosis not present

## 2021-03-10 DIAGNOSIS — E782 Mixed hyperlipidemia: Secondary | ICD-10-CM | POA: Diagnosis not present

## 2021-03-10 DIAGNOSIS — R197 Diarrhea, unspecified: Secondary | ICD-10-CM | POA: Diagnosis not present

## 2021-03-10 DIAGNOSIS — F0150 Vascular dementia without behavioral disturbance: Secondary | ICD-10-CM | POA: Diagnosis not present

## 2021-03-10 DIAGNOSIS — I10 Essential (primary) hypertension: Secondary | ICD-10-CM | POA: Diagnosis not present

## 2021-03-10 DIAGNOSIS — D509 Iron deficiency anemia, unspecified: Secondary | ICD-10-CM | POA: Diagnosis not present

## 2021-03-10 DIAGNOSIS — K219 Gastro-esophageal reflux disease without esophagitis: Secondary | ICD-10-CM | POA: Diagnosis not present

## 2021-03-10 DIAGNOSIS — E1122 Type 2 diabetes mellitus with diabetic chronic kidney disease: Secondary | ICD-10-CM | POA: Diagnosis not present

## 2021-03-10 DIAGNOSIS — N390 Urinary tract infection, site not specified: Secondary | ICD-10-CM | POA: Diagnosis not present

## 2021-03-10 DIAGNOSIS — N184 Chronic kidney disease, stage 4 (severe): Secondary | ICD-10-CM | POA: Diagnosis not present

## 2021-03-10 DIAGNOSIS — E559 Vitamin D deficiency, unspecified: Secondary | ICD-10-CM | POA: Diagnosis not present

## 2021-03-15 DIAGNOSIS — F4323 Adjustment disorder with mixed anxiety and depressed mood: Secondary | ICD-10-CM | POA: Diagnosis not present

## 2021-03-16 DIAGNOSIS — F0150 Vascular dementia without behavioral disturbance: Secondary | ICD-10-CM | POA: Diagnosis not present

## 2021-03-16 DIAGNOSIS — F4323 Adjustment disorder with mixed anxiety and depressed mood: Secondary | ICD-10-CM | POA: Diagnosis not present

## 2021-03-21 DIAGNOSIS — N390 Urinary tract infection, site not specified: Secondary | ICD-10-CM | POA: Diagnosis not present

## 2021-03-21 DIAGNOSIS — M5136 Other intervertebral disc degeneration, lumbar region: Secondary | ICD-10-CM | POA: Diagnosis not present

## 2021-03-21 DIAGNOSIS — F0150 Vascular dementia without behavioral disturbance: Secondary | ICD-10-CM | POA: Diagnosis not present

## 2021-03-21 DIAGNOSIS — I69318 Other symptoms and signs involving cognitive functions following cerebral infarction: Secondary | ICD-10-CM | POA: Diagnosis not present

## 2021-03-21 DIAGNOSIS — E1122 Type 2 diabetes mellitus with diabetic chronic kidney disease: Secondary | ICD-10-CM | POA: Diagnosis not present

## 2021-03-21 DIAGNOSIS — N184 Chronic kidney disease, stage 4 (severe): Secondary | ICD-10-CM | POA: Diagnosis not present

## 2021-03-21 DIAGNOSIS — K219 Gastro-esophageal reflux disease without esophagitis: Secondary | ICD-10-CM | POA: Diagnosis not present

## 2021-03-21 DIAGNOSIS — I129 Hypertensive chronic kidney disease with stage 1 through stage 4 chronic kidney disease, or unspecified chronic kidney disease: Secondary | ICD-10-CM | POA: Diagnosis not present

## 2021-03-21 DIAGNOSIS — F4323 Adjustment disorder with mixed anxiety and depressed mood: Secondary | ICD-10-CM | POA: Diagnosis not present

## 2021-03-21 DIAGNOSIS — M170 Bilateral primary osteoarthritis of knee: Secondary | ICD-10-CM | POA: Diagnosis not present

## 2021-03-21 DIAGNOSIS — R45851 Suicidal ideations: Secondary | ICD-10-CM | POA: Diagnosis not present

## 2021-03-21 DIAGNOSIS — N362 Urethral caruncle: Secondary | ICD-10-CM | POA: Diagnosis not present

## 2021-03-21 DIAGNOSIS — M810 Age-related osteoporosis without current pathological fracture: Secondary | ICD-10-CM | POA: Diagnosis not present

## 2021-03-25 DIAGNOSIS — F4323 Adjustment disorder with mixed anxiety and depressed mood: Secondary | ICD-10-CM | POA: Diagnosis not present

## 2021-04-08 DIAGNOSIS — F4323 Adjustment disorder with mixed anxiety and depressed mood: Secondary | ICD-10-CM | POA: Diagnosis not present

## 2021-04-12 DIAGNOSIS — R197 Diarrhea, unspecified: Secondary | ICD-10-CM | POA: Diagnosis not present

## 2021-04-12 DIAGNOSIS — R269 Unspecified abnormalities of gait and mobility: Secondary | ICD-10-CM | POA: Diagnosis not present

## 2021-04-12 DIAGNOSIS — F0150 Vascular dementia without behavioral disturbance: Secondary | ICD-10-CM | POA: Diagnosis not present

## 2021-04-12 DIAGNOSIS — K219 Gastro-esophageal reflux disease without esophagitis: Secondary | ICD-10-CM | POA: Diagnosis not present

## 2021-04-12 DIAGNOSIS — N390 Urinary tract infection, site not specified: Secondary | ICD-10-CM | POA: Diagnosis not present

## 2021-04-12 DIAGNOSIS — E782 Mixed hyperlipidemia: Secondary | ICD-10-CM | POA: Diagnosis not present

## 2021-04-12 DIAGNOSIS — E1122 Type 2 diabetes mellitus with diabetic chronic kidney disease: Secondary | ICD-10-CM | POA: Diagnosis not present

## 2021-04-12 DIAGNOSIS — I10 Essential (primary) hypertension: Secondary | ICD-10-CM | POA: Diagnosis not present

## 2021-04-12 DIAGNOSIS — D509 Iron deficiency anemia, unspecified: Secondary | ICD-10-CM | POA: Diagnosis not present

## 2021-04-12 DIAGNOSIS — N184 Chronic kidney disease, stage 4 (severe): Secondary | ICD-10-CM | POA: Diagnosis not present

## 2021-04-13 DIAGNOSIS — F0150 Vascular dementia without behavioral disturbance: Secondary | ICD-10-CM | POA: Diagnosis not present

## 2021-04-13 DIAGNOSIS — M79675 Pain in left toe(s): Secondary | ICD-10-CM | POA: Diagnosis not present

## 2021-04-13 DIAGNOSIS — B351 Tinea unguium: Secondary | ICD-10-CM | POA: Diagnosis not present

## 2021-04-13 DIAGNOSIS — F4323 Adjustment disorder with mixed anxiety and depressed mood: Secondary | ICD-10-CM | POA: Diagnosis not present

## 2021-04-20 DIAGNOSIS — F4323 Adjustment disorder with mixed anxiety and depressed mood: Secondary | ICD-10-CM | POA: Diagnosis not present

## 2021-04-26 DIAGNOSIS — N390 Urinary tract infection, site not specified: Secondary | ICD-10-CM | POA: Diagnosis not present

## 2021-05-05 DIAGNOSIS — E782 Mixed hyperlipidemia: Secondary | ICD-10-CM | POA: Diagnosis not present

## 2021-05-05 DIAGNOSIS — E1122 Type 2 diabetes mellitus with diabetic chronic kidney disease: Secondary | ICD-10-CM | POA: Diagnosis not present

## 2021-05-05 DIAGNOSIS — I69398 Other sequelae of cerebral infarction: Secondary | ICD-10-CM | POA: Diagnosis not present

## 2021-05-05 DIAGNOSIS — D509 Iron deficiency anemia, unspecified: Secondary | ICD-10-CM | POA: Diagnosis not present

## 2021-05-05 DIAGNOSIS — F0150 Vascular dementia without behavioral disturbance: Secondary | ICD-10-CM | POA: Diagnosis not present

## 2021-05-05 DIAGNOSIS — E559 Vitamin D deficiency, unspecified: Secondary | ICD-10-CM | POA: Diagnosis not present

## 2021-05-05 DIAGNOSIS — I10 Essential (primary) hypertension: Secondary | ICD-10-CM | POA: Diagnosis not present

## 2021-05-05 DIAGNOSIS — N390 Urinary tract infection, site not specified: Secondary | ICD-10-CM | POA: Diagnosis not present

## 2021-05-05 DIAGNOSIS — R197 Diarrhea, unspecified: Secondary | ICD-10-CM | POA: Diagnosis not present

## 2021-05-05 DIAGNOSIS — K219 Gastro-esophageal reflux disease without esophagitis: Secondary | ICD-10-CM | POA: Diagnosis not present

## 2021-05-05 DIAGNOSIS — M810 Age-related osteoporosis without current pathological fracture: Secondary | ICD-10-CM | POA: Diagnosis not present

## 2021-05-05 DIAGNOSIS — N184 Chronic kidney disease, stage 4 (severe): Secondary | ICD-10-CM | POA: Diagnosis not present

## 2021-05-14 ENCOUNTER — Encounter (HOSPITAL_COMMUNITY): Payer: Self-pay

## 2021-05-14 ENCOUNTER — Emergency Department (HOSPITAL_COMMUNITY)
Admission: EM | Admit: 2021-05-14 | Discharge: 2021-05-14 | Disposition: A | Discharge status: Home / Self Care | Payer: PPO | Attending: Emergency Medicine | Admitting: Emergency Medicine

## 2021-05-14 ENCOUNTER — Emergency Department (HOSPITAL_COMMUNITY): Payer: PPO

## 2021-05-14 DIAGNOSIS — F4323 Adjustment disorder with mixed anxiety and depressed mood: Secondary | ICD-10-CM | POA: Diagnosis not present

## 2021-05-14 DIAGNOSIS — E1169 Type 2 diabetes mellitus with other specified complication: Secondary | ICD-10-CM | POA: Insufficient documentation

## 2021-05-14 DIAGNOSIS — F0390 Unspecified dementia without behavioral disturbance: Secondary | ICD-10-CM | POA: Diagnosis not present

## 2021-05-14 DIAGNOSIS — E1122 Type 2 diabetes mellitus with diabetic chronic kidney disease: Secondary | ICD-10-CM | POA: Diagnosis not present

## 2021-05-14 DIAGNOSIS — Z79899 Other long term (current) drug therapy: Secondary | ICD-10-CM | POA: Insufficient documentation

## 2021-05-14 DIAGNOSIS — N184 Chronic kidney disease, stage 4 (severe): Secondary | ICD-10-CM | POA: Diagnosis not present

## 2021-05-14 DIAGNOSIS — Z853 Personal history of malignant neoplasm of breast: Secondary | ICD-10-CM | POA: Insufficient documentation

## 2021-05-14 DIAGNOSIS — R4182 Altered mental status, unspecified: Secondary | ICD-10-CM | POA: Insufficient documentation

## 2021-05-14 DIAGNOSIS — Z7984 Long term (current) use of oral hypoglycemic drugs: Secondary | ICD-10-CM | POA: Diagnosis not present

## 2021-05-14 DIAGNOSIS — I10 Essential (primary) hypertension: Secondary | ICD-10-CM | POA: Diagnosis not present

## 2021-05-14 DIAGNOSIS — E785 Hyperlipidemia, unspecified: Secondary | ICD-10-CM | POA: Insufficient documentation

## 2021-05-14 DIAGNOSIS — F32A Depression, unspecified: Secondary | ICD-10-CM | POA: Diagnosis not present

## 2021-05-14 DIAGNOSIS — I129 Hypertensive chronic kidney disease with stage 1 through stage 4 chronic kidney disease, or unspecified chronic kidney disease: Secondary | ICD-10-CM | POA: Diagnosis not present

## 2021-05-14 DIAGNOSIS — Z7982 Long term (current) use of aspirin: Secondary | ICD-10-CM | POA: Diagnosis not present

## 2021-05-14 DIAGNOSIS — R404 Transient alteration of awareness: Secondary | ICD-10-CM | POA: Diagnosis not present

## 2021-05-14 DIAGNOSIS — Z87891 Personal history of nicotine dependence: Secondary | ICD-10-CM | POA: Diagnosis not present

## 2021-05-14 HISTORY — DX: Diverticulitis of intestine, part unspecified, without perforation or abscess without bleeding: K57.92

## 2021-05-14 HISTORY — DX: Other intervertebral disc degeneration, thoracic region: M51.34

## 2021-05-14 LAB — CBC WITH DIFFERENTIAL/PLATELET
Abs Immature Granulocytes: 0.02 10*3/uL (ref 0.00–0.07)
Basophils Absolute: 0 10*3/uL (ref 0.0–0.1)
Basophils Relative: 1 %
Eosinophils Absolute: 0.3 10*3/uL (ref 0.0–0.5)
Eosinophils Relative: 6 %
HCT: 38.8 % (ref 36.0–46.0)
Hemoglobin: 12.3 g/dL (ref 12.0–15.0)
Immature Granulocytes: 0 %
Lymphocytes Relative: 28 %
Lymphs Abs: 1.4 10*3/uL (ref 0.7–4.0)
MCH: 30.5 pg (ref 26.0–34.0)
MCHC: 31.7 g/dL (ref 30.0–36.0)
MCV: 96.3 fL (ref 80.0–100.0)
Monocytes Absolute: 0.5 10*3/uL (ref 0.1–1.0)
Monocytes Relative: 10 %
Neutro Abs: 2.9 10*3/uL (ref 1.7–7.7)
Neutrophils Relative %: 55 %
Platelets: 211 10*3/uL (ref 150–400)
RBC: 4.03 MIL/uL (ref 3.87–5.11)
RDW: 13.2 % (ref 11.5–15.5)
WBC: 5.2 10*3/uL (ref 4.0–10.5)
nRBC: 0 % (ref 0.0–0.2)

## 2021-05-14 LAB — COMPREHENSIVE METABOLIC PANEL
ALT: 11 U/L (ref 0–44)
AST: 13 U/L — ABNORMAL LOW (ref 15–41)
Albumin: 4 g/dL (ref 3.5–5.0)
Alkaline Phosphatase: 57 U/L (ref 38–126)
Anion gap: 9 (ref 5–15)
BUN: 38 mg/dL — ABNORMAL HIGH (ref 8–23)
CO2: 22 mmol/L (ref 22–32)
Calcium: 8.6 mg/dL — ABNORMAL LOW (ref 8.9–10.3)
Chloride: 111 mmol/L (ref 98–111)
Creatinine, Ser: 1.83 mg/dL — ABNORMAL HIGH (ref 0.44–1.00)
GFR, Estimated: 25 mL/min — ABNORMAL LOW (ref >60)
Glucose, Bld: 99 mg/dL (ref 70–99)
Potassium: 3.9 mmol/L (ref 3.5–5.1)
Sodium: 142 mmol/L (ref 135–145)
Total Bilirubin: 0.7 mg/dL (ref 0.3–1.2)
Total Protein: 6.6 g/dL (ref 6.5–8.1)

## 2021-05-14 LAB — URINALYSIS, ROUTINE W REFLEX MICROSCOPIC
Bilirubin Urine: NEGATIVE
Glucose, UA: NEGATIVE mg/dL
Hgb urine dipstick: NEGATIVE
Ketones, ur: NEGATIVE mg/dL
Leukocytes,Ua: NEGATIVE
Nitrite: NEGATIVE
Protein, ur: >=300 mg/dL — AB
Specific Gravity, Urine: 1.016 (ref 1.005–1.030)
pH: 5 (ref 5.0–8.0)

## 2021-05-14 LAB — TROPONIN I (HIGH SENSITIVITY): Troponin I (High Sensitivity): 9 ng/L (ref <18)

## 2021-05-14 MED ORDER — SODIUM CHLORIDE 0.9 % IV BOLUS
500.0000 mL | Freq: Once | INTRAVENOUS | Status: AC
  Administered 2021-05-14: 500 mL via INTRAVENOUS

## 2021-05-14 MED ORDER — SODIUM CHLORIDE 0.9 % IV BOLUS: 500.0000 mL | Freq: Once | INTRAVENOUS | Status: DC

## 2021-05-14 NOTE — ED Triage Notes (Signed)
EMS reports pt is from [facility] living.  Reports ams x 3 hours.  Says daughter couldn't get pt to talk as much as she usually is.  Pt  oriented to self.  Denies pain or weakness, c/o feeling cold.

## 2021-05-14 NOTE — ED Notes (Signed)
Update given to [facility] at [place]

## 2021-05-14 NOTE — ED Notes (Signed)
Report to [facility] at [place]

## 2021-05-14 NOTE — ED Provider Notes (Signed)
[facility] Provider Note   CSN: [id number] Arrival date & time: 05/14/21  1023     History Chief Complaint  Patient presents with   Altered Mental Status    Nancy Medina is a 87 y.o. female.  HPI 87 year old female with a history of CVA, CKD stage III, anemia, BPPV, hypertension, osteoporosis, hyperlipidemia presents to the ER with reported altered mental status.  Patient presented from [facility] assisted living, triage notes note altered mental status 3 hours ago, however after discussion with the patient's daughter the patient has been more confused over the last week.  Patient reportedly received some "bad news" about a pressure who had died, and has increasingly more confused and not as talkative.  Daughter visited the patient today and noticed she was less talkative, and wanted her to come be evaluated in the ER.    I spoke with the patient's daughter who also stated that she finished a course of antibiotics for UTI approximately a week ago.  Since then she has been more altered.    Past Medical History:  Diagnosis Date   Achilles tendinitis    Allergic rhinitis    Anemia of chronic disease    at least back to 2010   Anxiety    Benign paroxysmal positional vertigo    Cataract    Cerebrovascular accident [facility])    Chronic kidney disease (CKD), stage III (moderate) (HCC)    Degenerative disc disease, thoracic    Depression    Diverticulitis    DJD (degenerative joint disease)    DM (diabetes mellitus) ([place])    type II   GERD (gastroesophageal reflux disease)    HTN (hypertension)    HX: breast cancer    Hyperlipidemia    Hypertonicity of bladder    Knee pain, left    Leg pain    Lymphocytic colitis 2008   treated with Lialda short-term   Memory loss    Obesity    Osteoporosis, unspecified    Renal disease    stage III   Unspecified fall    Unspecified glaucoma(365.9)     Patient Active Problem List   Diagnosis Date Noted    Hypertension associated with stage 4 chronic kidney disease due to type 2 diabetes mellitus ([place]) 05/01/2020   Hyperlipidemia associated with type 2 diabetes mellitus ([place]) 05/01/2020   CKD stage 4 due to type 2 diabetes mellitus ([place]) 05/01/2020   Acute blood loss anemia 05/01/2020   Bilateral lower extremity edema 05/01/2020   Increased intraocular pressure, bilateral 05/01/2020   Chronic constipation 05/01/2020   Displaced intertrochanteric fracture of left femur, subsequent encounter for closed fracture with routine healing 04/27/2020   Gait abnormality 09/13/2018   Mild cognitive impairment 09/13/2018   Uncontrolled hypertension 07/06/2018   Fall 07/04/2018   Scalp laceration 07/04/2018   Acute bronchitis due to Rhinovirus 03/20/2018   Diabetic hyperosmolar non-ketotic state ([place]) 03/18/2018   Acute respiratory failure with hypoxia ([place]) 03/18/2018   CKD (chronic kidney disease) stage 4, GFR 15-29 ml/min ([place]) 03/18/2018   Acute lower UTI 02/09/2018   Nausea 02/09/2018   Constipation 02/09/2018   Diarrhea 11/25/2011   Lymphocytic colitis 06/15/2011   Esophageal dysphagia 06/15/2011   UNSPECIFIED ANEMIA 04/27/2009   ACHILLES TENDINITIS 10/27/2008   PERIPHERAL EDEMA 10/14/2008   DEGENERATIVE [place] DISEASE 01/15/2008   Type 2 diabetes mellitus with hyperlipidemia ([place]) 12/13/2007   HYPERLIPIDEMIA 10/11/2006   OBESITY NOS 10/11/2006   Anxiety 10/11/2006   Depression 10/11/2006  GLAUCOMA NOS 10/11/2006   BENIGN POSITIONAL VERTIGO 10/11/2006   Essential hypertension 10/11/2006   GERD 10/11/2006   OVERACTIVE BLADDER 10/11/2006   OSTEOPOROSIS 10/11/2006   BREAST CANCER, HX OF 10/11/2006   CEREBROVASCULAR ACCIDENT, HX OF 10/11/2006    Past Surgical History:  Procedure Laterality Date   Bladder tack  2000   BREAST LUMPECTOMY  2004   right   cataracts     CHOLECYSTECTOMY  1960   COLONOSCOPY  07/2007   lymphocytic colitis   ESOPHAGOGASTRODUODENOSCOPY  06/24/2011   Procedure:  ESOPHAGOGASTRODUODENOSCOPY (EGD);  Surgeon: Dorothyann Peng, MD;  Location[facility];  Service: Endoscopy;  Laterality: N/A;.  Peptic stricture, mild erosive reflux esophagitis, hiatal   FEMUR IM NAIL Left 04/28/2020   Procedure: INTRAMEDULLARY (IM) NAIL FEMORAL;  Surgeon: Marchia Bond, MD;  Location: [facility] ORS;  Service: Orthopedics;  Laterality: Left;   FLEXIBLE SIGMOIDOSCOPY  11/25/2011   MODERATE DIVERTICULOSIS/INTERNAL HEMORRHOIDS   S/P Hysterectomy  2000   benign reasons   SAVORY DILATION  06/24/2011   Procedure: SAVORY DILATION;  Surgeon: Dorothyann Peng, MD;  Location: [facility];  Service: Endoscopy;  Laterality: N/A;     OB History   No obstetric history on file.     Family History  Problem Relation Age of Onset   Arthritis Mother        died at age 72   Other Father        died at age 23 - horse accident   Colon cancer Neg Hx            Liver disease Neg Hx            GI problems Neg Hx             Social History   Tobacco Use   Smoking status: Former    Packs/day: 1.00    Pack years: 0.00    Types: Cigarettes   Smokeless tobacco: Never   Tobacco comments:    30 yrs ago  Vaping Use   Vaping Use: Never used  Substance Use Topics   Alcohol use: No   Drug use: No    Home Medications Prior to Admission medications   Medication Sig Start Date End Date Taking? Authorizing Provider  acetaminophen (TYLENOL) 650 MG CR tablet Take 650 mg by mouth every 6 (six) hours. 05/05/20  Yes [provider]  amLODipine (NORVASC) 10 MG tablet Take 1 tablet (10 mg total) by mouth daily. 07/07/18  Yes Dhungel, Nishant, MD  aspirin 81 MG tablet Take 81 mg by mouth daily.     Yes [provider]  brimonidine (ALPHAGAN) 0.2 % ophthalmic solution Place 1 drop into both eyes every 8 (eight) hours. 02/23/21  Yes [provider]  Cholecalciferol (VITAMIN D) 2000 units CAPS Take 1 capsule by mouth daily.   Yes [provider]  clonazePAM (KLONOPIN) 1  MG tablet Take 1 mg by mouth at bedtime. Special Instructions: As needed for restlessness and agitation thru 05/14/20. Attempt and document a non-drug intervention and outcome prior to administering medication.  04/30/20 05/14/21 Yes [provider]  cycloSPORINE (RESTASIS) 0.05 % ophthalmic emulsion Place 1 drop into both eyes 2 (two) times daily.   Yes [provider]  dorzolamide-timolol (COSOPT) 22.3-6.8 MG/ML ophthalmic solution Place 1 drop into both eyes in the morning and at bedtime. 07/15/20  Yes [provider]  furosemide (LASIX) 20 MG tablet Take 20 mg by mouth daily.  03/07/18  Yes [provider]  glimepiride (AMARYL) 1 MG tablet Take 1 mg by mouth daily.  04/24/18  Yes [provider]  loperamide (IMODIUM A-D) 2 MG tablet Take 2 mg by mouth 4 (four) times daily as needed for diarrhea or loose stools.   Yes [provider]  methocarbamol (ROBAXIN) 500 MG tablet Take 1 tablet (500 mg total) by mouth every 6 (six) hours as needed for muscle spasms. 04/30/20  Yes Pokhrel, Corrie Mckusick, MD  NON FORMULARY Diet: _____ Regular, ______ NAS, ___x____Consistent Carbohydrate, _______NPO _____Other   Yes [provider]  omeprazole (PRILOSEC) 20 MG capsule Take 20 mg by mouth daily. 01/23/18  Yes [provider]  ROCKLATAN 0.02-0.005 % SOLN Apply 1 drop to eye at bedtime. 02/20/21  Yes [provider]  sitaGLIPtin (JANUVIA) 25 MG tablet Take 1 tablet (25 mg total) by mouth daily. 03/20/18  Yes Tat, Shanon Brow, MD  vitamin B-12 (CYANOCOBALAMIN) 1000 MCG tablet Take 1 tablet (1,000 mcg total) by mouth daily. 03/20/18  Yes Tat, Shanon Brow, MD  cephALEXin (KEFLEX) 500 MG capsule Take 1 capsule (500 mg total) by mouth 4 (four) times daily. Patient not taking: No sig reported 07/28/20   Milton Ferguson, MD  enoxaparin (LOVENOX) 30 MG/0.3ML injection Inject 0.3 mLs (30 mg total) into the skin daily for 27 days. 05/01/20 05/28/20  Pokhrel, Corrie Mckusick, MD   ezetimibe (ZETIA) 10 MG tablet Take 10 mg by mouth daily.  Patient not taking: Reported on 05/14/2021 01/24/18   [provider]  feeding supplement, ENSURE ENLIVE, (ENSURE ENLIVE) LIQD Take 237 mLs by mouth 2 (two) times daily between meals. Patient not taking: Reported on 05/14/2021 04/30/20   Flora Lipps, MD  ferrous sulfate 325 (65 FE) MG tablet Take 1 tablet (325 mg total) by mouth 3 (three) times daily after meals. 04/30/20 10/27/20  Pokhrel, Corrie Mckusick, MD  ondansetron (ZOFRAN) 4 MG tablet Take 1 tablet (4 mg total) by mouth every 6 (six) hours as needed for nausea. Patient not taking: No sig reported 04/30/20   Pokhrel, Corrie Mckusick, MD  polyethylene glycol (MIRALAX / GLYCOLAX) 17 g packet Take 17 g by mouth daily as needed for moderate constipation. Patient not taking: No sig reported 04/30/20   Pokhrel, Corrie Mckusick, MD  PROAIR HFA 108 [phone] Base) MCG/ACT inhaler Inhale 2 puffs into the lungs every 6 (six) hours as needed for wheezing or shortness of breath. Patient not taking: No sig reported 04/30/20   Pokhrel, Corrie Mckusick, MD    Allergies    Metformin  Review of Systems   Review of Systems  Unable to perform ROS: Dementia   Physical Exam Updated Vital Signs BP (!) 181/71   Pulse 60   Temp (!) 97.1 F (36.2 C) (Rectal)   Resp 14   SpO2 97%   Physical Exam Vitals reviewed.  Constitutional:      Appearance: Normal appearance.  HENT:     Head: Normocephalic and atraumatic.  Eyes:     General:        Right eye: No discharge.        Left eye: No discharge.     Extraocular Movements: Extraocular movements intact.     Conjunctiva/sclera: Conjunctivae normal.  Cardiovascular:     Pulses: Normal pulses.     Heart sounds: Normal heart sounds.  Pulmonary:     Effort: Pulmonary effort is normal.     Breath sounds: Normal breath sounds.  Abdominal:     General: Abdomen is flat.     Tenderness:  There is no abdominal tenderness.  Musculoskeletal:        General: No swelling. Normal  range of motion.  Neurological:     General: No focal deficit present.     Mental Status: She is alert and oriented to person, place, and time.     Sensory: No sensory deficit.     Motor: No weakness.     Comments: Mental Status:  Alert, oriented to person, Speech fluent without evidence of aphasia, though slow to respond. Able to follow 2 step commands without difficulty.  Cranial Nerves:  II:  Peripheral visual fields grossly normal, pupils equal, round, reactive to light III,IV, VI: ptosis not present, extra-ocular motions intact bilaterally  V,VII: smile symmetric, facial light touch sensation equal VIII: hearing grossly normal to voice  X: uvula elevates symmetrically  XI: bilateral shoulder shrug symmetric and strong XII: midline tongue extension without fassiculations Motor:  Normal tone. 5/5 strength of BUE and BLE major muscle groups including strong and equal grip strength and dorsiflexion/plantar flexion Sensory: light touch normal in all extremities. Cerebellar: normal finger-to-nose with bilateral upper extremities, Romberg sign absent Gait: not accessed    Psychiatric:        Mood and Affect: Mood normal.        Behavior: Behavior normal.    ED Results / Procedures / Treatments   Labs (all labs ordered are listed, but only abnormal results are displayed) Labs Reviewed  COMPREHENSIVE METABOLIC PANEL - Abnormal; Notable for the following components:      Result Value   BUN 38 (*)    Creatinine, Ser 1.83 (*)    Calcium 8.6 (*)    AST 13 (*)    GFR, Estimated 25 (*)    All other components within normal limits  URINALYSIS, ROUTINE W REFLEX MICROSCOPIC - Abnormal; Notable for the following components:   Protein, ur >=300 (*)    Bacteria, UA RARE (*)    All other components within normal limits  CBC WITH DIFFERENTIAL/PLATELET  TROPONIN I (HIGH SENSITIVITY)    EKG EKG Interpretation  Date/Time:  Friday May 14 2021 12:10:09 EDT Ventricular Rate:  64 PR  Interval:    QRS Duration: 88 QT Interval:  461 QTC Calculation: 476 R Axis:   -13 Text Interpretation: Normal sinus rhythm Low voltage, precordial leads Abnormal R-wave progression, early transition No significant change since last tracing Confirmed by Calvert Cantor [phone]) on 05/14/2021 12:18:34 PM  Radiology CT Head Wo Contrast  Result Date: 05/14/2021 CLINICAL DATA:  Altered mental status. EXAM: CT HEAD WITHOUT CONTRAST TECHNIQUE: Contiguous axial images were obtained from the base of the skull through the vertex without intravenous contrast. COMPARISON:  07/28/2020 FINDINGS: Brain: Stable moderately enlarged ventricles and subarachnoid spaces. Stable marked patchy white matter low density in both cerebral hemispheres. Stable old left occipital lobe infarct and old bilateral cerebellar hemisphere infarcts. No intracranial hemorrhage, mass lesion or CT evidence of acute infarction. Vascular: No hyperdense vessel or unexpected calcification. Skull: Normal. Negative for fracture or focal lesion. Sinuses/Orbits: Status post bilateral cataract extraction. Unremarkable bones and included paranasal sinuses. Other: Left concha bullosa and deviation of the mid nasal septum to the right. IMPRESSION: 1. No acute abnormality. 2. Stable atrophy, chronic small vessel white matter ischemic changes and old infarcts. Electronically Signed   By: Claudie Revering M.D.   On: 05/14/2021 13:16    Procedures Procedures   Medications Ordered in ED Medications  sodium chloride 0.9 % bolus 500 mL (500 mLs Intravenous New  Bag/Given 05/14/21 1706)    ED Course  I have reviewed the triage vital signs and the nursing notes.  Pertinent labs & imaging results that were available during my care of the patient were reviewed by me and considered in my medical decision making (see chart for details).  Clinical Course as of 05/14/21 1747  Ludwig Clarks May 14, 2021  1707 This is a very pleasant 87 year old female presenting from a  nursing facility with concern for confusion today.  She had transient altered mental status earlier this morning.  She is now back to her baseline per family member at bedside.  The patient has no complaints.  She denies any headache or chest pain.  On exam she is mildly hypertensive but otherwise has normal vital signs.  Her labs showed no sign of a urine infection, normal white blood cell count, no evidence of anemia or significant dehydration.  I doubt this is sepsis.  She is pending a troponin to evaluate for atypical ACS, although EKG does not show acute ischemic findings per my interpretation.  If this troponin is low or unremarkable, anticipate she can be discharged back to her nursing facility.  CT scan of the head also shows no evidence of stroke or CVA. [MT]  1708 Both the patient and her niece are in agreement with this plan. [MT]    Clinical Course User Index [MT] Trifan, Carola Rhine, MD   MDM Rules/Calculators/A&P                         87 year old female presents to the ER with altered mental status.  On arrival, she is alert, oriented to self, able to follow two-step commands.  No focal neurodeficits on exam.  Vitals with elevated BPs though this does appear to be more or less at baseline.  Afebrile, not tachycardic, tachypneic or hypoxic.  Labs overall reassuring, creatinine is slightly elevated from 9 months ago 1.83 as compared to 1.28,could be suggestive of dehydration, however she does have a prior history of her creatinine being as high as 2.  CBC unremarkable.  UA with no significant signs of UTI with proteinuria and rare bacteria.  CT of the head without any acute findings.  EKG normal sinus rhythm.  Initial troponin of 9, low suspicion for ACS.  Patient received 500 cc bolus of fluids.  On reevaluation, patient is now at baseline per niece at bedside.  Patient was also seen and evaluated by Dr. Langston Masker, family agreeable for discharge back to facility.  Stable for discharge. Final  Clinical Impression(s) / ED Diagnoses Final diagnoses:  Altered mental status, unspecified altered mental status type    Rx / DC Orders ED Discharge Orders     None        Lyndel Safe 05/14/21 1747    Wyvonnia Dusky, MD 05/15/21 1130

## 2021-05-14 NOTE — ED Notes (Signed)
EMS reports pt got some "bad news" a few days ago and thought she was depressed however ems says pt has had ams x 3 hours.  Called facility. Per facility, [employer], says she had been with pt all night.  Says she was depressed but would talk and answer questions.  Reports approx 3 hours ago staff noticed decreased responsiveness and crying.  Pt verbal upon arrival.  Staff says pt has been this way off and on since she found out a church member passed away a few days ago.

## 2021-05-14 NOTE — Discharge Instructions (Addendum)
You were evaluated in the Emergency Department and after careful evaluation, we did not find any emergent condition requiring admission or further testing in the hospital. ° °Please return to the Emergency Department if you experience any worsening of your condition.  We encourage you to follow up with a primary care provider.  Thank you for allowing us to be a part of your care. °

## 2021-05-20 DIAGNOSIS — F4323 Adjustment disorder with mixed anxiety and depressed mood: Secondary | ICD-10-CM | POA: Diagnosis not present

## 2021-05-20 DIAGNOSIS — F0150 Vascular dementia without behavioral disturbance: Secondary | ICD-10-CM | POA: Diagnosis not present

## 2021-05-20 DIAGNOSIS — G4701 Insomnia due to medical condition: Secondary | ICD-10-CM | POA: Diagnosis not present

## 2021-05-25 DIAGNOSIS — H401112 Primary open-angle glaucoma, right eye, moderate stage: Secondary | ICD-10-CM | POA: Diagnosis not present

## 2021-05-25 DIAGNOSIS — H401122 Primary open-angle glaucoma, left eye, moderate stage: Secondary | ICD-10-CM | POA: Diagnosis not present

## 2021-05-25 DIAGNOSIS — H353130 Nonexudative age-related macular degeneration, bilateral, stage unspecified: Secondary | ICD-10-CM | POA: Diagnosis not present

## 2021-05-25 DIAGNOSIS — H53461 Homonymous bilateral field defects, right side: Secondary | ICD-10-CM | POA: Diagnosis not present

## 2021-06-02 DIAGNOSIS — N184 Chronic kidney disease, stage 4 (severe): Secondary | ICD-10-CM | POA: Diagnosis not present

## 2021-06-02 DIAGNOSIS — F0150 Vascular dementia without behavioral disturbance: Secondary | ICD-10-CM | POA: Diagnosis not present

## 2021-06-02 DIAGNOSIS — E1122 Type 2 diabetes mellitus with diabetic chronic kidney disease: Secondary | ICD-10-CM | POA: Diagnosis not present

## 2021-06-02 DIAGNOSIS — D509 Iron deficiency anemia, unspecified: Secondary | ICD-10-CM | POA: Diagnosis not present

## 2021-06-02 DIAGNOSIS — I69398 Other sequelae of cerebral infarction: Secondary | ICD-10-CM | POA: Diagnosis not present

## 2021-06-02 DIAGNOSIS — E782 Mixed hyperlipidemia: Secondary | ICD-10-CM | POA: Diagnosis not present

## 2021-06-02 DIAGNOSIS — I10 Essential (primary) hypertension: Secondary | ICD-10-CM | POA: Diagnosis not present

## 2021-06-02 DIAGNOSIS — R197 Diarrhea, unspecified: Secondary | ICD-10-CM | POA: Diagnosis not present

## 2021-06-02 DIAGNOSIS — K219 Gastro-esophageal reflux disease without esophagitis: Secondary | ICD-10-CM | POA: Diagnosis not present

## 2021-06-02 DIAGNOSIS — E559 Vitamin D deficiency, unspecified: Secondary | ICD-10-CM | POA: Diagnosis not present

## 2021-06-02 DIAGNOSIS — M810 Age-related osteoporosis without current pathological fracture: Secondary | ICD-10-CM | POA: Diagnosis not present

## 2021-06-04 DIAGNOSIS — F4323 Adjustment disorder with mixed anxiety and depressed mood: Secondary | ICD-10-CM | POA: Diagnosis not present

## 2021-06-08 DIAGNOSIS — F0150 Vascular dementia without behavioral disturbance: Secondary | ICD-10-CM | POA: Diagnosis not present

## 2021-06-08 DIAGNOSIS — F4323 Adjustment disorder with mixed anxiety and depressed mood: Secondary | ICD-10-CM | POA: Diagnosis not present

## 2021-06-08 DIAGNOSIS — G4701 Insomnia due to medical condition: Secondary | ICD-10-CM | POA: Diagnosis not present

## 2021-06-10 DIAGNOSIS — F4323 Adjustment disorder with mixed anxiety and depressed mood: Secondary | ICD-10-CM | POA: Diagnosis not present

## 2021-06-11 DIAGNOSIS — N184 Chronic kidney disease, stage 4 (severe): Secondary | ICD-10-CM | POA: Diagnosis not present

## 2021-06-11 DIAGNOSIS — E1122 Type 2 diabetes mellitus with diabetic chronic kidney disease: Secondary | ICD-10-CM | POA: Diagnosis not present

## 2021-06-11 DIAGNOSIS — D509 Iron deficiency anemia, unspecified: Secondary | ICD-10-CM | POA: Diagnosis not present

## 2021-06-22 DIAGNOSIS — F4323 Adjustment disorder with mixed anxiety and depressed mood: Secondary | ICD-10-CM | POA: Diagnosis not present

## 2021-07-06 DIAGNOSIS — M79675 Pain in left toe(s): Secondary | ICD-10-CM | POA: Diagnosis not present

## 2021-07-06 DIAGNOSIS — F4323 Adjustment disorder with mixed anxiety and depressed mood: Secondary | ICD-10-CM | POA: Diagnosis not present

## 2021-07-06 DIAGNOSIS — B351 Tinea unguium: Secondary | ICD-10-CM | POA: Diagnosis not present

## 2021-07-13 DIAGNOSIS — G4701 Insomnia due to medical condition: Secondary | ICD-10-CM | POA: Diagnosis not present

## 2021-07-13 DIAGNOSIS — F4323 Adjustment disorder with mixed anxiety and depressed mood: Secondary | ICD-10-CM | POA: Diagnosis not present

## 2021-07-13 DIAGNOSIS — F0150 Vascular dementia without behavioral disturbance: Secondary | ICD-10-CM | POA: Diagnosis not present

## 2021-07-14 DIAGNOSIS — R197 Diarrhea, unspecified: Secondary | ICD-10-CM | POA: Diagnosis not present

## 2021-07-14 DIAGNOSIS — N184 Chronic kidney disease, stage 4 (severe): Secondary | ICD-10-CM | POA: Diagnosis not present

## 2021-07-14 DIAGNOSIS — I129 Hypertensive chronic kidney disease with stage 1 through stage 4 chronic kidney disease, or unspecified chronic kidney disease: Secondary | ICD-10-CM | POA: Diagnosis not present

## 2021-07-14 DIAGNOSIS — M810 Age-related osteoporosis without current pathological fracture: Secondary | ICD-10-CM | POA: Diagnosis not present

## 2021-07-14 DIAGNOSIS — F0150 Vascular dementia without behavioral disturbance: Secondary | ICD-10-CM | POA: Diagnosis not present

## 2021-07-14 DIAGNOSIS — E1122 Type 2 diabetes mellitus with diabetic chronic kidney disease: Secondary | ICD-10-CM | POA: Diagnosis not present

## 2021-07-14 DIAGNOSIS — E559 Vitamin D deficiency, unspecified: Secondary | ICD-10-CM | POA: Diagnosis not present

## 2021-07-14 DIAGNOSIS — M1652 Unilateral post-traumatic osteoarthritis, left hip: Secondary | ICD-10-CM | POA: Diagnosis not present

## 2021-07-14 DIAGNOSIS — F4323 Adjustment disorder with mixed anxiety and depressed mood: Secondary | ICD-10-CM | POA: Diagnosis not present

## 2021-07-14 DIAGNOSIS — K219 Gastro-esophageal reflux disease without esophagitis: Secondary | ICD-10-CM | POA: Diagnosis not present

## 2021-07-14 DIAGNOSIS — R269 Unspecified abnormalities of gait and mobility: Secondary | ICD-10-CM | POA: Diagnosis not present

## 2021-07-27 ENCOUNTER — Observation Stay (HOSPITAL_COMMUNITY)
Admit: 2021-07-27 | Discharge: 2021-07-27 | Disposition: A | Discharge status: Home / Self Care | Payer: PPO | Attending: Family Medicine | Admitting: Family Medicine

## 2021-07-27 ENCOUNTER — Emergency Department (HOSPITAL_COMMUNITY): Payer: PPO

## 2021-07-27 ENCOUNTER — Observation Stay (HOSPITAL_COMMUNITY): Payer: PPO

## 2021-07-27 ENCOUNTER — Inpatient Hospital Stay (HOSPITAL_COMMUNITY)
Admission: EM | Admit: 2021-07-27 | Discharge: 2021-07-29 | DRG: 078 | Disposition: A | Discharge status: Home Health | Payer: PPO | Source: Skilled Nursing Facility | Attending: Internal Medicine | Admitting: Internal Medicine

## 2021-07-27 ENCOUNTER — Encounter (HOSPITAL_COMMUNITY): Payer: Self-pay | Admitting: Emergency Medicine

## 2021-07-27 ENCOUNTER — Other Ambulatory Visit: Payer: Self-pay

## 2021-07-27 DIAGNOSIS — K5909 Other constipation: Secondary | ICD-10-CM | POA: Diagnosis present

## 2021-07-27 DIAGNOSIS — I674 Hypertensive encephalopathy: Principal | ICD-10-CM | POA: Diagnosis present

## 2021-07-27 DIAGNOSIS — R0902 Hypoxemia: Secondary | ICD-10-CM | POA: Diagnosis not present

## 2021-07-27 DIAGNOSIS — Z853 Personal history of malignant neoplasm of breast: Secondary | ICD-10-CM

## 2021-07-27 DIAGNOSIS — R400 Somnolence: Secondary | ICD-10-CM

## 2021-07-27 DIAGNOSIS — F0390 Unspecified dementia without behavioral disturbance: Secondary | ICD-10-CM | POA: Diagnosis present

## 2021-07-27 DIAGNOSIS — F32A Depression, unspecified: Secondary | ICD-10-CM | POA: Diagnosis present

## 2021-07-27 DIAGNOSIS — R531 Weakness: Secondary | ICD-10-CM | POA: Diagnosis present

## 2021-07-27 DIAGNOSIS — M810 Age-related osteoporosis without current pathological fracture: Secondary | ICD-10-CM | POA: Diagnosis present

## 2021-07-27 DIAGNOSIS — Z20822 Contact with and (suspected) exposure to covid-19: Secondary | ICD-10-CM | POA: Diagnosis present

## 2021-07-27 DIAGNOSIS — Z7984 Long term (current) use of oral hypoglycemic drugs: Secondary | ICD-10-CM

## 2021-07-27 DIAGNOSIS — K219 Gastro-esophageal reflux disease without esophagitis: Secondary | ICD-10-CM | POA: Diagnosis present

## 2021-07-27 DIAGNOSIS — E860 Dehydration: Secondary | ICD-10-CM | POA: Diagnosis present

## 2021-07-27 DIAGNOSIS — K5900 Constipation, unspecified: Secondary | ICD-10-CM | POA: Diagnosis present

## 2021-07-27 DIAGNOSIS — W19XXXA Unspecified fall, initial encounter: Secondary | ICD-10-CM

## 2021-07-27 DIAGNOSIS — Z8673 Personal history of transient ischemic attack (TIA), and cerebral infarction without residual deficits: Secondary | ICD-10-CM | POA: Diagnosis not present

## 2021-07-27 DIAGNOSIS — G9341 Metabolic encephalopathy: Secondary | ICD-10-CM | POA: Diagnosis not present

## 2021-07-27 DIAGNOSIS — H409 Unspecified glaucoma: Secondary | ICD-10-CM | POA: Diagnosis present

## 2021-07-27 DIAGNOSIS — R4701 Aphasia: Secondary | ICD-10-CM | POA: Diagnosis present

## 2021-07-27 DIAGNOSIS — E1122 Type 2 diabetes mellitus with diabetic chronic kidney disease: Secondary | ICD-10-CM | POA: Diagnosis present

## 2021-07-27 DIAGNOSIS — Z8261 Family history of arthritis: Secondary | ICD-10-CM

## 2021-07-27 DIAGNOSIS — F419 Anxiety disorder, unspecified: Secondary | ICD-10-CM | POA: Diagnosis present

## 2021-07-27 DIAGNOSIS — R110 Nausea: Secondary | ICD-10-CM | POA: Diagnosis present

## 2021-07-27 DIAGNOSIS — I10 Essential (primary) hypertension: Secondary | ICD-10-CM | POA: Diagnosis present

## 2021-07-27 DIAGNOSIS — Z9049 Acquired absence of other specified parts of digestive tract: Secondary | ICD-10-CM | POA: Diagnosis not present

## 2021-07-27 DIAGNOSIS — I639 Cerebral infarction, unspecified: Secondary | ICD-10-CM | POA: Diagnosis not present

## 2021-07-27 DIAGNOSIS — R4182 Altered mental status, unspecified: Secondary | ICD-10-CM | POA: Diagnosis present

## 2021-07-27 DIAGNOSIS — I129 Hypertensive chronic kidney disease with stage 1 through stage 4 chronic kidney disease, or unspecified chronic kidney disease: Secondary | ICD-10-CM | POA: Diagnosis present

## 2021-07-27 DIAGNOSIS — N184 Chronic kidney disease, stage 4 (severe): Secondary | ICD-10-CM | POA: Diagnosis present

## 2021-07-27 DIAGNOSIS — Z7982 Long term (current) use of aspirin: Secondary | ICD-10-CM

## 2021-07-27 DIAGNOSIS — E1169 Type 2 diabetes mellitus with other specified complication: Secondary | ICD-10-CM | POA: Diagnosis present

## 2021-07-27 DIAGNOSIS — Z79899 Other long term (current) drug therapy: Secondary | ICD-10-CM

## 2021-07-27 DIAGNOSIS — I63233 Cerebral infarction due to unspecified occlusion or stenosis of bilateral carotid arteries: Secondary | ICD-10-CM | POA: Diagnosis not present

## 2021-07-27 DIAGNOSIS — D638 Anemia in other chronic diseases classified elsewhere: Secondary | ICD-10-CM | POA: Diagnosis present

## 2021-07-27 DIAGNOSIS — M5030 Other cervical disc degeneration, unspecified cervical region: Secondary | ICD-10-CM | POA: Diagnosis not present

## 2021-07-27 DIAGNOSIS — F05 Delirium due to known physiological condition: Secondary | ICD-10-CM | POA: Diagnosis not present

## 2021-07-27 DIAGNOSIS — G3184 Mild cognitive impairment, so stated: Secondary | ICD-10-CM

## 2021-07-27 DIAGNOSIS — E785 Hyperlipidemia, unspecified: Secondary | ICD-10-CM | POA: Diagnosis present

## 2021-07-27 DIAGNOSIS — I771 Stricture of artery: Secondary | ICD-10-CM | POA: Diagnosis not present

## 2021-07-27 DIAGNOSIS — I6782 Cerebral ischemia: Secondary | ICD-10-CM | POA: Diagnosis not present

## 2021-07-27 DIAGNOSIS — R0689 Other abnormalities of breathing: Secondary | ICD-10-CM | POA: Diagnosis not present

## 2021-07-27 DIAGNOSIS — I6621 Occlusion and stenosis of right posterior cerebral artery: Secondary | ICD-10-CM | POA: Diagnosis not present

## 2021-07-27 DIAGNOSIS — I672 Cerebral atherosclerosis: Secondary | ICD-10-CM | POA: Diagnosis not present

## 2021-07-27 DIAGNOSIS — Z888 Allergy status to other drugs, medicaments and biological substances status: Secondary | ICD-10-CM

## 2021-07-27 DIAGNOSIS — R404 Transient alteration of awareness: Secondary | ICD-10-CM | POA: Diagnosis not present

## 2021-07-27 DIAGNOSIS — H8110 Benign paroxysmal vertigo, unspecified ear: Secondary | ICD-10-CM | POA: Diagnosis present

## 2021-07-27 DIAGNOSIS — I63531 Cerebral infarction due to unspecified occlusion or stenosis of right posterior cerebral artery: Secondary | ICD-10-CM | POA: Diagnosis not present

## 2021-07-27 DIAGNOSIS — Z66 Do not resuscitate: Secondary | ICD-10-CM | POA: Diagnosis present

## 2021-07-27 DIAGNOSIS — D649 Anemia, unspecified: Secondary | ICD-10-CM | POA: Diagnosis present

## 2021-07-27 DIAGNOSIS — D538 Other specified nutritional anemias: Secondary | ICD-10-CM | POA: Diagnosis not present

## 2021-07-27 DIAGNOSIS — G459 Transient cerebral ischemic attack, unspecified: Secondary | ICD-10-CM | POA: Diagnosis not present

## 2021-07-27 DIAGNOSIS — Z043 Encounter for examination and observation following other accident: Secondary | ICD-10-CM | POA: Diagnosis not present

## 2021-07-27 LAB — URINALYSIS, ROUTINE W REFLEX MICROSCOPIC
Bilirubin Urine: NEGATIVE
Glucose, UA: NEGATIVE mg/dL
Ketones, ur: NEGATIVE mg/dL
Leukocytes,Ua: NEGATIVE
Nitrite: NEGATIVE
Protein, ur: 100 mg/dL — AB
Specific Gravity, Urine: 1.015 (ref 1.005–1.030)
pH: 5.5 (ref 5.0–8.0)

## 2021-07-27 LAB — DIFFERENTIAL
Abs Immature Granulocytes: 0.05 10*3/uL (ref 0.00–0.07)
Basophils Absolute: 0.1 10*3/uL (ref 0.0–0.1)
Basophils Relative: 1 %
Eosinophils Absolute: 0.4 10*3/uL (ref 0.0–0.5)
Eosinophils Relative: 4 %
Immature Granulocytes: 1 %
Lymphocytes Relative: 19 %
Lymphs Abs: 2 10*3/uL (ref 0.7–4.0)
Monocytes Absolute: 0.8 10*3/uL (ref 0.1–1.0)
Monocytes Relative: 8 %
Neutro Abs: 7.3 10*3/uL (ref 1.7–7.7)
Neutrophils Relative %: 67 %

## 2021-07-27 LAB — CBC
HCT: 36.2 % (ref 36.0–46.0)
Hemoglobin: 11.7 g/dL — ABNORMAL LOW (ref 12.0–15.0)
MCH: 30.8 pg (ref 26.0–34.0)
MCHC: 32.3 g/dL (ref 30.0–36.0)
MCV: 95.3 fL (ref 80.0–100.0)
Platelets: 213 10*3/uL (ref 150–400)
RBC: 3.8 MIL/uL — ABNORMAL LOW (ref 3.87–5.11)
RDW: 13.6 % (ref 11.5–15.5)
WBC: 10.6 10*3/uL — ABNORMAL HIGH (ref 4.0–10.5)
nRBC: 0 % (ref 0.0–0.2)

## 2021-07-27 LAB — RESP PANEL BY RT-PCR (FLU A&B, COVID) ARPGX2
Influenza A by PCR: NEGATIVE
Influenza B by PCR: NEGATIVE
SARS Coronavirus 2 by RT PCR: NEGATIVE

## 2021-07-27 LAB — COMPREHENSIVE METABOLIC PANEL
ALT: 10 U/L (ref 0–44)
AST: 13 U/L — ABNORMAL LOW (ref 15–41)
Albumin: 3.8 g/dL (ref 3.5–5.0)
Alkaline Phosphatase: 51 U/L (ref 38–126)
Anion gap: 8 (ref 5–15)
BUN: 41 mg/dL — ABNORMAL HIGH (ref 8–23)
CO2: 22 mmol/L (ref 22–32)
Calcium: 8.4 mg/dL — ABNORMAL LOW (ref 8.9–10.3)
Chloride: 108 mmol/L (ref 98–111)
Creatinine, Ser: 1.78 mg/dL — ABNORMAL HIGH (ref 0.44–1.00)
GFR, Estimated: 26 mL/min — ABNORMAL LOW (ref >60)
Glucose, Bld: 117 mg/dL — ABNORMAL HIGH (ref 70–99)
Potassium: 3.7 mmol/L (ref 3.5–5.1)
Sodium: 138 mmol/L (ref 135–145)
Total Bilirubin: 0.7 mg/dL (ref 0.3–1.2)
Total Protein: 6.5 g/dL (ref 6.5–8.1)

## 2021-07-27 LAB — HEMOGLOBIN A1C
Hgb A1c MFr Bld: 6.1 % — ABNORMAL HIGH (ref 4.8–5.6)
Mean Plasma Glucose: 128.37 mg/dL

## 2021-07-27 LAB — ECHOCARDIOGRAM COMPLETE
Area-P 1/2: 4.74 cm2
P 1/2 time: 596 msec
S' Lateral: 2.66 cm

## 2021-07-27 LAB — APTT: aPTT: 25 seconds (ref 24–36)

## 2021-07-27 LAB — URINALYSIS, MICROSCOPIC (REFLEX)

## 2021-07-27 LAB — PROTIME-INR
INR: 1 (ref 0.8–1.2)
Prothrombin Time: 12.8 seconds (ref 11.4–15.2)

## 2021-07-27 LAB — ETHANOL: Alcohol, Ethyl (B): <10 mg/dL (ref <10)

## 2021-07-27 MED ORDER — ACETAMINOPHEN 160 MG/5ML PO SOLN: 650.0000 mg | ORAL | Status: DC | PRN

## 2021-07-27 MED ORDER — ACETAMINOPHEN 650 MG RE SUPP: 650.0000 mg | RECTAL | Status: DC | PRN

## 2021-07-27 MED ORDER — CYCLOSPORINE 0.05 % OP EMUL
1.0000 [drp] | Freq: Two times a day (BID) | OPHTHALMIC | Status: DC
  Administered 2021-07-27 – 2021-07-28 (×3): 1 [drp] via OPHTHALMIC
  Filled 2021-07-27 (×3): qty 1

## 2021-07-27 MED ORDER — HYDRALAZINE HCL 10 MG PO TABS
10.0000 mg | ORAL_TABLET | Freq: Three times a day (TID) | ORAL | Status: DC
  Administered 2021-07-27 – 2021-07-28 (×5): 10 mg via ORAL
  Filled 2021-07-27 (×6): qty 1

## 2021-07-27 MED ORDER — MIRTAZAPINE 15 MG PO TABS
7.5000 mg | ORAL_TABLET | Freq: Every day | ORAL | Status: DC
  Administered 2021-07-27 – 2021-07-28 (×2): 7.5 mg via ORAL
  Filled 2021-07-27 (×2): qty 1

## 2021-07-27 MED ORDER — DORZOLAMIDE HCL-TIMOLOL MAL 2-0.5 % OP SOLN
1.0000 [drp] | Freq: Two times a day (BID) | OPHTHALMIC | Status: DC
  Administered 2021-07-27 – 2021-07-28 (×3): 1 [drp] via OPHTHALMIC
  Filled 2021-07-27 (×2): qty 10

## 2021-07-27 MED ORDER — ASPIRIN 81 MG PO CHEW
81.0000 mg | CHEWABLE_TABLET | Freq: Every day | ORAL | Status: DC
  Administered 2021-07-27 – 2021-07-28 (×2): 81 mg via ORAL
  Filled 2021-07-27 (×3): qty 1

## 2021-07-27 MED ORDER — ENOXAPARIN SODIUM 30 MG/0.3ML IJ SOSY
30.0000 mg | PREFILLED_SYRINGE | INTRAMUSCULAR | Status: DC
  Administered 2021-07-27 – 2021-07-28 (×2): 30 mg via SUBCUTANEOUS
  Filled 2021-07-27 (×2): qty 0.3

## 2021-07-27 MED ORDER — SODIUM CHLORIDE 0.9 % IV SOLN: INTRAVENOUS | Status: DC

## 2021-07-27 MED ORDER — AMLODIPINE BESYLATE 5 MG PO TABS
10.0000 mg | ORAL_TABLET | Freq: Every day | ORAL | Status: DC
  Administered 2021-07-27 – 2021-07-28 (×2): 10 mg via ORAL
  Filled 2021-07-27 (×3): qty 2

## 2021-07-27 MED ORDER — IOHEXOL 350 MG/ML SOLN
100.0000 mL | Freq: Once | INTRAVENOUS | Status: AC | PRN
  Administered 2021-07-27: 75 mL via INTRAVENOUS

## 2021-07-27 MED ORDER — ACETAMINOPHEN 325 MG PO TABS: 650.0000 mg | ORAL_TABLET | ORAL | Status: DC | PRN

## 2021-07-27 MED ORDER — CLONAZEPAM 0.5 MG PO TABS: 0.5000 mg | ORAL_TABLET | Freq: Every evening | ORAL | Status: DC | PRN

## 2021-07-27 MED ORDER — VITAMIN D 25 MCG (1000 UNIT) PO TABS
2000.0000 [IU] | ORAL_TABLET | Freq: Every day | ORAL | Status: DC
  Administered 2021-07-27 – 2021-07-28 (×2): 2000 [IU] via ORAL
  Filled 2021-07-27 (×3): qty 2

## 2021-07-27 MED ORDER — BRIMONIDINE TARTRATE 0.2 % OP SOLN
1.0000 [drp] | Freq: Three times a day (TID) | OPHTHALMIC | Status: DC
  Administered 2021-07-27 – 2021-07-29 (×4): 1 [drp] via OPHTHALMIC
  Filled 2021-07-27 (×2): qty 5

## 2021-07-27 MED ORDER — VITAMIN B-12 1000 MCG PO TABS
1000.0000 ug | ORAL_TABLET | Freq: Every day | ORAL | Status: DC
  Administered 2021-07-27 – 2021-07-28 (×2): 1000 ug via ORAL
  Filled 2021-07-27 (×3): qty 1

## 2021-07-27 MED ORDER — SENNOSIDES-DOCUSATE SODIUM 8.6-50 MG PO TABS: 1.0000 | ORAL_TABLET | Freq: Every evening | ORAL | Status: DC | PRN

## 2021-07-27 MED ORDER — STROKE: EARLY STAGES OF RECOVERY BOOK
Freq: Once | Status: DC
  Filled 2021-07-27: qty 1

## 2021-07-27 MED ORDER — METHOCARBAMOL 500 MG PO TABS: 500.0000 mg | ORAL_TABLET | Freq: Four times a day (QID) | ORAL | Status: DC | PRN

## 2021-07-27 MED ORDER — NETARSUDIL-LATANOPROST 0.02-0.005 % OP SOLN: 1.0000 [drp] | Freq: Every day | OPHTHALMIC | Status: DC

## 2021-07-27 NOTE — Progress Notes (Signed)
EEG complete - results pending 

## 2021-07-27 NOTE — Progress Notes (Signed)
  Echocardiogram 2D Echocardiogram has been performed.  Nancy Medina 07/27/2021, 2:52 PM

## 2021-07-27 NOTE — ED Provider Notes (Signed)
  Physical Exam  BP (!) 144/76   Pulse 75   Temp (!) 97.5 F (36.4 C) (Axillary)   Resp 14   SpO2 95%   Physical Exam  ED Course/Procedures     Procedures  MDM  Received patient in signout.  Had had confusion.  Questionable focal neurodeficits last night.  There was however some question of baseline.  Patient is improved now but still not quite at her baseline.  Less focal exam however.  Seen by neurology and felt less likely stroke but recommends admission to the hospital for blood pressure control and MRI.  Lab work overall reassuring.  CT scans reassuring.  Does have urine still pending however.  Will discuss with hospitalist for admission.       Davonna Belling, MD 07/27/21 1130

## 2021-07-27 NOTE — Consult Note (Signed)
[facility]rology Consult Services   Date of Service:   07/27/2021 06:35:56  Diagnosis:       G94.3 - Encephalopathy in diseases classified elsewhere       I63.9 - Cerebrovascular accident (CVA), unspecified mechanism ([place])  Impression:      89yoF hx of dementia, CVA, HTN, DM, HLD found down at assisted living. LKW unclear. Possibly at baseline per facility staff. CTH neg for acute process. CTA neg for LVO. Exam shows bilateral leg weakness, nonverbal, no clear sign of neglect. Encephalopathy 2/2 metabolic/infection vs CVA vs HTN urgency.    Recommend admission for CBC, CMP, MRI brain, and blood pressure management. If lacks improvement and workup is unrevealing, consider EEG. Continue aspirin for now.  Metrics: Last Known Well: Unknown [facility] Notification Time: 07/27/2021 06:35:56 Arrival Time: 07/27/2021 06:17:00 Stamp Time: 07/27/2021 06:35:56 Initial Response Time: 07/27/2021 06:37:04 Symptoms: AMS. NIHSS Start Assessment Time: 07/27/2021 06:40:27 Patient is not a candidate for Thrombolytic. Thrombolytic Medical Decision: 07/27/2021 06:42:27 Patient was not deemed candidate for Thrombolytic because of following reasons: Last known well unclear. Per staff at facility patient is at baseline when EMS carried her out. .  CT head showed no acute hemorrhage or acute core infarct. CT head was reviewed and results were: I personally Reviewed the CT Head and it Showed no hemorrhage. Prior left occipital, bilateral basal ganglia, and bilateral cerebellar infarcts.  ED Physician notified of diagnostic impression and management plan on 07/27/2021 06:53:05  Advanced Imaging: CTA Head and Neck Completed.  LVO:No  Patient doesn't meet criteria for emergent NIR consideration   Our recommendations are outlined below.  Recommendations:        Stroke/Telemetry Floor       Neuro Checks       Bedside Swallow Eval       DVT Prophylaxis       IV  Fluids, Normal Saline       Head of Bed 30 Degrees       Euglycemia and Avoid Hyperthermia (PRN Acetaminophen)       Initiate or continue Aspirin 81 MG daily  Routine Consultation with [facility] for Follow up Care  Sign Out:       Discussed with Emergency Department Provider    ------------------------------------------------------------------------------  History of Present Illness: Patient is a 87 year old Female.  Patient was brought by EMS for symptoms of AMS.  Per report patient came from assisted living and was found down this morning with report of possible left side neglect and weakness. Last known well possibly at [facility]. Stroke nurse called the facility and was told patient has history of dementia with wax/wane mental status, walks sometimes, does not use her left side sometimes. Unable to clarify if there was sufficient interaction at 4AM to confirmed last known well. Facility also told stroke nurse that when EMS was taking the patient out patient was at her baesline. Hx of anxiety, CVA, CKD, DM, HTN, HLD, dementia   Anticoagulant use:  No  Antiplatelet use: Yes aspirin  Allergies:  Reviewed Allergies Text: metformin    Examination: BP(180/73), Pulse(90), 1A: Level of Consciousness - Alert; keenly responsive + 0 1B: Ask Month and Age - Aphasic + 2 1C: Blink Eyes & Squeeze Hands - Performs Both Tasks + 0 2: Test Horizontal Extraocular Movements - Normal + 0 3: Test Visual Fields - No Visual Loss + 0 4: Test Facial Palsy (Use Grimace if Obtunded) - Minor paralysis (flat nasolabial fold, smile asymmetry) + 1 5A:  Test Left Arm Motor Drift - No Drift for 10 Seconds + 0 5B: Test Right Arm Motor Drift - No Drift for 10 Seconds + 0 6A: Test Left Leg Motor Drift - No Effort Against Gravity + 3 6B: Test Right Leg Motor Drift - No Effort Against Gravity + 3 7: Test Limb Ataxia (FNF/Heel-Shin) - No Ataxia + 0 8: Test Sensation - Normal; No sensory loss + 0 9: Test  Language/Aphasia - Mute/Global Aphasia: No Usable Speech/Auditory Comprehension + 3 10: Test Dysarthria - Mute/Anarthric + 2 11: Test Extinction/Inattention - No abnormality + 0  NIHSS Score: 14   Pre-Morbid Modified Rankin Scale: 4 Points = Moderately severe disability; unable to walk and attend to bodily needs without assistance   Patient/Family was informed the Neurology Consult would occur via TeleHealth consult by way of interactive audio and video telecommunications and consented to receiving care in this manner.   Patient is being evaluated for possible acute neurologic impairment and high probability of imminent or life-threatening deterioration. I spent total of 30 minutes providing care to this patient, including time for face to face visit via telemedicine, review of medical records, imaging studies and discussion of findings with providers, the patient and/or family.   Dr Edward Jolly   [facility] [phone]  Case [id number]

## 2021-07-27 NOTE — Progress Notes (Signed)
CODE STROKE  Call time  6:13am [id number]   859 VUFCZ   443 QIX   658 KIY   349 QJSI   739 Rad called  [phone]

## 2021-07-27 NOTE — ED Notes (Signed)
Assisted neurology with neuro assessment.

## 2021-07-27 NOTE — H&P (Signed)
History and Physical  [facility]  Nancy Medina BTD:[id number] DOB: 1925-07-05 DOA: 07/27/2021  PCP: Lemmie Evens, MD  [facility]  Level of care: Telemetry  I have personally briefly reviewed patient's old medical records in [place]  Chief Complaint: fall/AMS   HPI: Nancy Medina is a 87 y.o. female with medical history significant for cerebrovascular disease, hypertension, hyperlipidemia, type 2 diabetes mellitus who is a long-term resident at [facility] assisted living facility apparently fell out of bed and was found on the floor early this morning by staff members.  Patient apparently was noted to have a right-sided gaze preference and was not talking and there was concern that she was aphasic.  Staff reported that there may been a short period of time where she was unresponsive.  This had resolved by the time she arrived.  She was evaluated in the emergency department and showing bilateral leg weakness.  CT head was negative for any acute process.  Her daughter says that she is not completely at her baseline at this time.  However she appears to be improving daily with supportive measures.  Code stroke was called in emergency department and patient was evaluated by telemetry neurologist.  He recommended admission for MRI brain and blood pressure management.  Patient was noted to have elevated blood pressure on arrival.  He recommended considering EEG.  Also recommended CBC CMP.  tPA was not administered.  He recommended neurochecks swallow evaluation IV fluids euglycemia and initiated or continue aspirin 81 mg daily.  Review of Systems: Review of Systems  Constitutional: Negative.   HENT: Negative.    Eyes: Negative.   Respiratory: Negative.    Cardiovascular: Negative.   Gastrointestinal: Negative.   Genitourinary: Negative.   Musculoskeletal:  Positive for falls, joint pain and myalgias.  Skin: Negative.   Neurological:  Positive for  dizziness, loss of consciousness and weakness. Negative for focal weakness and seizures.  Endo/Heme/Allergies: Negative.   Psychiatric/Behavioral: Negative.    All other systems reviewed and are negative.   Past Medical History:  Diagnosis Date   Achilles tendinitis    Allergic rhinitis    Anemia of chronic disease    at least back to 2010   Anxiety    Benign paroxysmal positional vertigo    Cataract    Cerebrovascular accident [facility])    Chronic kidney disease (CKD), stage III (moderate) (HCC)    Degenerative disc disease, thoracic    Depression    Diverticulitis    DJD (degenerative joint disease)    DM (diabetes mellitus) ([place])    type II   GERD (gastroesophageal reflux disease)    HTN (hypertension)    HX: breast cancer    Hyperlipidemia    Hypertonicity of bladder    Knee pain, left    Leg pain    Lymphocytic colitis 2008   treated with Lialda short-term   Memory loss    Obesity    Osteoporosis, unspecified    Renal disease    stage III   Unspecified fall    Unspecified glaucoma(365.9)     Past Surgical History:  Procedure Laterality Date   Bladder tack  2000   BREAST LUMPECTOMY  2004   right   cataracts     CHOLECYSTECTOMY  1960   COLONOSCOPY  07/2007   lymphocytic colitis   ESOPHAGOGASTRODUODENOSCOPY  06/24/2011   Procedure: ESOPHAGOGASTRODUODENOSCOPY (EGD);  Surgeon: Dorothyann Peng, MD;  Location: [facility];  Service: Endoscopy;  Laterality: N/A;.  Peptic stricture, mild erosive reflux esophagitis, hiatal   FEMUR IM NAIL Left 04/28/2020   Procedure: INTRAMEDULLARY (IM) NAIL FEMORAL;  Surgeon: Marchia Bond, MD;  Location: [facility] ORS;  Service: Orthopedics;  Laterality: Left;   FLEXIBLE SIGMOIDOSCOPY  11/25/2011   MODERATE DIVERTICULOSIS/INTERNAL HEMORRHOIDS   S/P Hysterectomy  2000   benign reasons   SAVORY DILATION  06/24/2011   Procedure: SAVORY DILATION;  Surgeon: Dorothyann Peng, MD;  Location: [facility];  Service: Endoscopy;  Laterality: N/A;      reports that she has quit smoking. Her smoking use included cigarettes. She smoked an average of 1 pack per day. She has never used smokeless tobacco. She reports that she does not drink alcohol and does not use drugs.  Allergies  Allergen Reactions   Metformin     This allergy is not listed under records with [facility]. Past records states that patient "felt poor" as a reaction to taking this medication    Family History  Problem Relation Age of Onset   Arthritis Mother        died at age 85   Other Father        died at age 55 - horse accident   Colon cancer Neg Hx            Liver disease Neg Hx            GI problems Neg Hx             Prior to Admission medications   Medication Sig Start Date End Date Taking? Authorizing Provider  acetaminophen (TYLENOL) 650 MG CR tablet Take 650 mg by mouth every 6 (six) hours. 05/05/20  Yes [provider]  amLODipine (NORVASC) 10 MG tablet Take 1 tablet (10 mg total) by mouth daily. 07/07/18  Yes Dhungel, Nishant, MD  aspirin 81 MG tablet Take 81 mg by mouth daily.     Yes [provider]  brimonidine (ALPHAGAN) 0.2 % ophthalmic solution Place 1 drop into both eyes every 8 (eight) hours. 02/23/21  Yes [provider]  Cholecalciferol (VITAMIN D) 2000 units CAPS Take 1 capsule by mouth daily.   Yes [provider]  clonazePAM (KLONOPIN) 0.5 MG tablet Take 0.5 mg by mouth daily as needed for anxiety. 07/01/21  Yes [provider]  cycloSPORINE (RESTASIS) 0.05 % ophthalmic emulsion Place 1 drop into both eyes 2 (two) times daily.   Yes [provider]  dorzolamide-timolol (COSOPT) 22.3-6.8 MG/ML ophthalmic solution Place 1 drop into both eyes in the morning and at bedtime. 07/15/20  Yes [provider]  glimepiride (AMARYL) 1 MG tablet Take 1 mg by mouth daily.  04/24/18  Yes [provider]  hydrALAZINE (APRESOLINE) 10 MG tablet Take 10 mg by mouth 3 (three) times  daily. 07/19/21  Yes [provider]  loperamide (IMODIUM A-D) 2 MG tablet Take 2 mg by mouth 4 (four) times daily as needed for diarrhea or loose stools.   Yes [provider]  methocarbamol (ROBAXIN) 500 MG tablet Take 1 tablet (500 mg total) by mouth every 6 (six) hours as needed for muscle spasms. 04/30/20  Yes Pokhrel, Laxman, MD  mirtazapine (REMERON) 7.5 MG tablet Take 7.5 mg by mouth at bedtime. 07/01/21  Yes [provider]  ROCKLATAN 0.02-0.005 % SOLN Apply 1 drop to eye at bedtime. 02/20/21  Yes [provider]  sitaGLIPtin (JANUVIA) 25 MG tablet Take 1 tablet (25 mg total) by mouth  daily. 03/20/18  Yes Tat, Shanon Brow, MD  vitamin B-12 (CYANOCOBALAMIN) 1000 MCG tablet Take 1 tablet (1,000 mcg total) by mouth daily. 03/20/18  Yes Tat, Shanon Brow, MD  enoxaparin (LOVENOX) 30 MG/0.3ML injection Inject 0.3 mLs (30 mg total) into the skin daily for 27 days. Patient not taking: Reported on 07/27/2021 05/01/20 05/28/20  Flora Lipps, MD  ferrous sulfate 325 (65 FE) MG tablet Take 1 tablet (325 mg total) by mouth 3 (three) times daily after meals. Patient not taking: Reported on 07/27/2021 04/30/20 10/27/20  Pokhrel, Corrie Mckusick, MD  NON FORMULARY Diet: _____ Regular, ______ NAS, ___x____Consistent Carbohydrate, _______NPO _____Other    [provider]    Physical Exam: Vitals:   07/27/21 0945 07/27/21 1015 07/27/21 1030 07/27/21 1100  BP: 135/67 (!) 143/70 (!) 151/72 (!) 144/76  Pulse: 81 72 71 75  Resp: 15 15 (!) 25 14  Temp:      TempSrc:      SpO2: 95% 95% 97% 95%   Constitutional: elderly female, awake, alert, speaking clear full sentences, normal memory function, NAD, calm, comfortable Eyes: PERRL, lids and conjunctivae normal ENMT: Mucous membranes are moist. Posterior pharynx clear of any exudate or lesions.Normal dentition.  Neck: normal, supple, no masses, no thyromegaly Respiratory: clear to auscultation bilaterally, no wheezing, no crackles. Normal  respiratory effort. No accessory muscle use.  Cardiovascular: normal s1, s2 sounds, no murmurs / rubs / gallops. No extremity edema. 2+ pedal pulses. No carotid bruits.  Abdomen: no tenderness, no masses palpated. No hepatosplenomegaly. Bowel sounds positive.  Musculoskeletal: diffuse arthritic changes, no clubbing / cyanosis. No joint deformity upper and lower extremities. Good ROM, no contractures. Normal muscle tone.  Skin: no rashes, lesions, ulcers. No induration Neurologic: CN 2-12 grossly intact. Sensation intact, DTR normal. Strength 5/5 in all 4.  Psychiatric: Normal judgment and insight. Alert and oriented x 3. Normal mood.   Labs on Admission: I have personally reviewed following labs and imaging studies  CBC: Recent Labs  Lab 07/27/21 0656  WBC 10.6*  NEUTROABS 7.3  HGB 11.7*  HCT 36.2  MCV 95.3  PLT 998   Basic Metabolic Panel: Recent Labs  Lab 07/27/21 0656  NA 138  K 3.7  CL 108  CO2 22  GLUCOSE 117*  BUN 41*  CREATININE 1.78*  CALCIUM 8.4*   GFR: CrCl cannot be calculated (Unknown ideal weight.). Liver Function Tests: Recent Labs  Lab 07/27/21 0656  AST 13*  ALT 10  ALKPHOS 51  BILITOT 0.7  PROT 6.5  ALBUMIN 3.8   No results for input(s): LIPASE, AMYLASE in the last 168 hours. No results for input(s): AMMONIA in the last 168 hours. Coagulation Profile: Recent Labs  Lab 07/27/21 0656  INR 1.0   Cardiac Enzymes: No results for input(s): CKTOTAL, CKMB, CKMBINDEX, TROPONINI in the last 168 hours. BNP (last 3 results) No results for input(s): PROBNP in the last 8760 hours. HbA1C: No results for input(s): HGBA1C in the last 72 hours. CBG: No results for input(s): GLUCAP in the last 168 hours. Lipid Profile: No results for input(s): CHOL, HDL, LDLCALC, TRIG, CHOLHDL, LDLDIRECT in the last 72 hours. Thyroid Function Tests: No results for input(s): TSH, T4TOTAL, FREET4, T3FREE, THYROIDAB in the last 72 hours. Anemia Panel: No results for  input(s): VITAMINB12, FOLATE, FERRITIN, TIBC, IRON, RETICCTPCT in the last 72 hours. Urine analysis:    Component Value Date/Time   COLORURINE YELLOW 05/14/2021 [place] 05/14/2021 1525   LABSPEC 1.016 05/14/2021 1525  PHURINE 5.0 05/14/2021 1525   GLUCOSEU NEGATIVE 05/14/2021 1525   HGBUR NEGATIVE 05/14/2021 1525   HGBUR negative 06/04/2008 0811   BILIRUBINUR NEGATIVE 05/14/2021 [place] 05/14/2021 1525   PROTEINUR >=300 (A) 05/14/2021 1525   UROBILINOGEN 0.2 06/04/2008 0811   NITRITE NEGATIVE 05/14/2021 1525   LEUKOCYTESUR NEGATIVE 05/14/2021 1525   Radiological Exams on Admission: CT Angio Head W or Wo Contrast  Result Date: 07/27/2021 CLINICAL DATA:  Stroke/TIA, assess extracranial arteries EXAM: CT ANGIOGRAPHY HEAD AND NECK TECHNIQUE: Multidetector CT imaging of the head and neck was performed using the standard protocol during bolus administration of intravenous contrast. Multiplanar CT image reconstructions and MIPs were obtained to evaluate the vascular anatomy. Carotid stenosis measurements (when applicable) are obtained utilizing NASCET criteria, using the distal internal carotid diameter as the denominator. CONTRAST:  55mL OMNIPAQUE IOHEXOL 350 MG/ML SOLN COMPARISON:  None. FINDINGS: CTA NECK FINDINGS Aortic arch: Great vessel origins are patent.  Atherosclerosis. Right carotid system: No evidence of dissection, stenosis (50% or greater) or occlusion. Left carotid system: No evidence of dissection, stenosis (50% or greater) or occlusion. Retropharyngeal course. Vertebral arteries: Tortuous bilaterally. No significant (greater than 50%) stenosis or evidence of dissection. Mild narrowing of the left vertebral artery origin due to calcific atherosclerosis. Skeleton: Further evaluated on concurrent CT of the cervical spine. Other neck: Multiple thyroid nodules, measuring up to 1.3 cm on the left. Not clinically significant; no follow-up imaging  recommended (ref: J Am Coll Radiol. 2015 Feb;12(2): 143-50). Upper chest: Visualized lung apices are clear. Review of the MIP images confirms the above findings CTA HEAD FINDINGS Anterior circulation: Bilateral intracranial ICAs, MCAs, and ACAs are patent. Mild for age calcific atherosclerosis of bilateral intracranial ICAs without greater than 50% stenosis. Mild-to-moderate right supraclinoid ICA stenosis (approximately 30-40% stenosis). Posterior circulation: Bilateral intradural vertebral arteries, basilar artery, and posterior cerebral arteries are patent. Dolichoectasia of the basilar artery, measuring up to 5.2 cm in diameter distally. Venous sinuses: As permitted by contrast timing, patent. Review of the MIP images confirms the above findings IMPRESSION: CTA head: 1. No large vessel occlusion. 2. Multifocal severe stenosis of the right P2 PCA. 3. Approximately 30-40% right supraclinoid ICA stenosis. 4. Dolichoectasia of the basilar artery. CTA neck: No significant (greater than 50%) stenosis. Electronically Signed   By: Margaretha Sheffield M.D.   On: 07/27/2021 07:34   CT Angio Neck W and/or Wo Contrast  Result Date: 07/27/2021 CLINICAL DATA:  Stroke/TIA, assess extracranial arteries EXAM: CT ANGIOGRAPHY HEAD AND NECK TECHNIQUE: Multidetector CT imaging of the head and neck was performed using the standard protocol during bolus administration of intravenous contrast. Multiplanar CT image reconstructions and MIPs were obtained to evaluate the vascular anatomy. Carotid stenosis measurements (when applicable) are obtained utilizing NASCET criteria, using the distal internal carotid diameter as the denominator. CONTRAST:  63mL OMNIPAQUE IOHEXOL 350 MG/ML SOLN COMPARISON:  None. FINDINGS: CTA NECK FINDINGS Aortic arch: Great vessel origins are patent.  Atherosclerosis. Right carotid system: No evidence of dissection, stenosis (50% or greater) or occlusion. Left carotid system: No evidence of dissection, stenosis  (50% or greater) or occlusion. Retropharyngeal course. Vertebral arteries: Tortuous bilaterally. No significant (greater than 50%) stenosis or evidence of dissection. Mild narrowing of the left vertebral artery origin due to calcific atherosclerosis. Skeleton: Further evaluated on concurrent CT of the cervical spine. Other neck: Multiple thyroid nodules, measuring up to 1.3 cm on the left. Not clinically significant; no follow-up imaging recommended (ref: J Am Coll Radiol. 2015  Feb;12(2): 143-50). Upper chest: Visualized lung apices are clear. Review of the MIP images confirms the above findings CTA HEAD FINDINGS Anterior circulation: Bilateral intracranial ICAs, MCAs, and ACAs are patent. Mild for age calcific atherosclerosis of bilateral intracranial ICAs without greater than 50% stenosis. Mild-to-moderate right supraclinoid ICA stenosis (approximately 30-40% stenosis). Posterior circulation: Bilateral intradural vertebral arteries, basilar artery, and posterior cerebral arteries are patent. Dolichoectasia of the basilar artery, measuring up to 5.2 cm in diameter distally. Venous sinuses: As permitted by contrast timing, patent. Review of the MIP images confirms the above findings IMPRESSION: CTA head: 1. No large vessel occlusion. 2. Multifocal severe stenosis of the right P2 PCA. 3. Approximately 30-40% right supraclinoid ICA stenosis. 4. Dolichoectasia of the basilar artery. CTA neck: No significant (greater than 50%) stenosis. Electronically Signed   By: Margaretha Sheffield M.D.   On: 07/27/2021 07:34   CT C-SPINE NO CHARGE  Result Date: 07/27/2021 CLINICAL DATA:  Fall W19.Merril Abbe (ICD-10-CM) EXAM: CT CERVICAL SPINE WITHOUT CONTRAST TECHNIQUE: Multidetector CT imaging of the cervical spine was performed without intravenous contrast. Multiplanar CT image reconstructions were also generated. COMPARISON:  May 09, 2018. FINDINGS: Alignment: Similar alignment. Similar straightening without substantial sagittal  subluxation. Skull base and vertebrae: No evidence of acute fracture. Mild height loss of the T2 vertebral body is favored to relate to degenerative remodeling given severe degenerative disc disease and endplate Schmorl's nodes at this level, partially imaged. Soft tissues and spinal canal: No prevertebral fluid or swelling. No visible canal hematoma. Disc levels: Multilevel degenerative disc disease and facet arthropathy in the cervical spine. Progressive degenerative disease at T1-T2, partially imaged. Upper chest: Visualized lung apices are clear. Other: Multiple thyroid nodules, measuring up to 1.3 cm on the left. Not clinically significant; no follow-up imaging recommended (ref: J Am Coll Radiol. 2015 Feb;12(2): 143-50). IMPRESSION: No evidence of acute fracture or traumatic malalignment in the cervical spine. Electronically Signed   By: Margaretha Sheffield M.D.   On: 07/27/2021 07:43   CT HEAD CODE STROKE WO CONTRAST  Result Date: 07/27/2021 CLINICAL DATA:  Code stroke.  May 14, 2021. EXAM: CT HEAD WITHOUT CONTRAST TECHNIQUE: Contiguous axial images were obtained from the base of the skull through the vertex without intravenous contrast. COMPARISON:  May 14, 2021. FINDINGS: Brain: No evidence of acute infarction, hemorrhage, hydrocephalus, extra-axial collection or mass lesion/mass effect. Similar prior left occipital, bilateral basal ganglia, and bilateral cerebellar infarcts. Similar additional moderate to advanced patchy and confluent white matter hypoattenuation, nonspecific but compatible with chronic microvascular ischemic disease. Vascular: No hyperdense vessel identified. Skull: Acute fracture. Sinuses/Orbits:  Clear sinuses. No acute orbital findings. Other: No mastoid effusions. ASPECTS [facility] Stroke Program Early CT Score) total score (0-10 with 10 being normal): 10. IMPRESSION: 1. No evidence of acute large vascular territory infarct or acute hemorrhage. ASPECTS is 10. 2. Similar prior left  occipital, bilateral basal ganglia, and bilateral cerebellar infarcts. 3. Moderate to advanced chronic microvascular ischemic disease. Code stroke imaging results were communicated on 07/27/2021 at 6:35 am to provider Dr. Sedonia Small via telephone, who verbally acknowledged these results. Electronically Signed   By: Margaretha Sheffield M.D.   On: 07/27/2021 06:38    EKG: Independently reviewed. Normal sinus rhythm  Assessment/Plan Principal Problem:   Altered mental state Active Problems:   Type 2 diabetes mellitus with hyperlipidemia (HCC)   Hyperlipidemia   UNSPECIFIED ANEMIA   Anxiety   Depression   Unspecified glaucoma   BENIGN POSITIONAL VERTIGO   Essential hypertension   GERD  Nausea   Constipation   CKD (chronic kidney disease) stage 4, GFR 15-29 ml/min (HCC)   Uncontrolled hypertension   Mild cognitive impairment   Hypertension associated with stage 4 chronic kidney disease due to type 2 diabetes mellitus (HCC)   Hyperlipidemia associated with type 2 diabetes mellitus ([place])   CKD stage 4 due to type 2 diabetes mellitus (HCC)   Chronic constipation   Acute encephalopathy -there was initial concerns of an acute CVA however CT and MRI have been negative.  Adult EEG ordered.  Follow-up urinalysis.  Possible TIA-continue aspirin 81 mg daily, follow-up MRI.  Neurochecks.  2D echocardiogram and carotid Dopplers ordered.  Continue IV fluid hydration.  Work on improving blood pressure.  Check fasting lipid panel in a.m.  Inpatient neurology consultation.  PT/OT evaluation SLP evaluation.    Type 2 diabetes mellitus with renal complications-check hemoglobin A1c, continue SSI coverage and CBG monitoring as ordered.  Hold home oral diabetes medications at this time.  Uncontrolled hypertension-resume home blood pressure medications, add IV labetalol as needed for SBP greater than 160.  Mild cognitive impairment-delirium precautions ordered.  Encourage family to stay at bedside as much as  possible.  Hyperlipidemia-resume home medications, check fasting lipid panel in a.m.  GERD-Protonix ordered for GI protection.    Chronic constipation - laxatives ordered as needed.      Stage IV CKD - stable creatinine, renally dose medications.    DVT prophylaxis: Enoxaparin Code Status: DNR Family Communication: Updated daughter at bedside with plan of care, verbalized understanding Disposition Plan: Anticipate return to ALF tomorrow if work-up negative Consults called: Neurology Admission status: OBV Level of care: Telemetry Irwin Brakeman MD [facility] How to contact the [facility] Attending or Consulting provider [place] or covering provider during after hours [place], for this patient?  Check the care team in [facility] and look for a) attending/consulting [facility] provider listed and b) the [facility] team listed Log into [email or web address] and use [place]'s universal password to access. If you do not have the password, please contact the hospital operator. Locate the [facility] provider you are looking for under [facility] and page to a number that you can be directly reached. If you still have difficulty reaching the provider, please page the [facility] (Director on Call) for the Hospitalists listed on amion for assistance.   If 7PM-7AM, please contact night-coverage [email or web address] Password [facility]  07/27/2021, 12:43 PM

## 2021-07-27 NOTE — ED Notes (Signed)
MD at bedside. 

## 2021-07-27 NOTE — ED Notes (Signed)
Pt to CT via EMS stretcher after hallway assessment by Dr. Sedonia Small. Saline lock started in CT and pt placed on MCAP cardiac monitor with BP to set cycle every 30 minutes. Continuous pulse oximeter applied.

## 2021-07-27 NOTE — ED Notes (Signed)
Pt. To MRI

## 2021-07-27 NOTE — ED Notes (Signed)
CODE STROKE PAGED 

## 2021-07-27 NOTE — ED Triage Notes (Signed)
Pt was found in floor at facility. Pt is altered and has neglect to the left side. Last known well was 0400.

## 2021-07-27 NOTE — ED Provider Notes (Signed)
[name] Emergency Department Provider Note MRN:  [id number]  Arrival date & time: 07/27/21     Chief Complaint   Code Stroke   History of Present Illness   Nancy Medina is a 87 y.o. year-old female with a history of stroke, diabetes, hypertension presenting to the ED with chief complaint of code stroke.  Last seen normal at 4 AM, conversant.  Found on the ground at facility shortly prior to arrival.  Patient has right-sided gaze preference and is not talking, concern for aphasia, code stroke.  Review of Systems  Positive for fall, aphasia, gaze preference.  Patient's Health History    Past Medical History:  Diagnosis Date   Achilles tendinitis    Allergic rhinitis    Anemia of chronic disease    at least back to 2010   Anxiety    Benign paroxysmal positional vertigo    Cataract    Cerebrovascular accident [facility])    Chronic kidney disease (CKD), stage III (moderate) (HCC)    Degenerative disc disease, thoracic    Depression    Diverticulitis    DJD (degenerative joint disease)    DM (diabetes mellitus) ([place])    type II   GERD (gastroesophageal reflux disease)    HTN (hypertension)    HX: breast cancer    Hyperlipidemia    Hypertonicity of bladder    Knee pain, left    Leg pain    Lymphocytic colitis 2008   treated with Lialda short-term   Memory loss    Obesity    Osteoporosis, unspecified    Renal disease    stage III   Unspecified fall    Unspecified glaucoma(365.9)     Past Surgical History:  Procedure Laterality Date   Bladder tack  2000   BREAST LUMPECTOMY  2004   right   cataracts     CHOLECYSTECTOMY  1960   COLONOSCOPY  07/2007   lymphocytic colitis   ESOPHAGOGASTRODUODENOSCOPY  06/24/2011   Procedure: ESOPHAGOGASTRODUODENOSCOPY (EGD);  Surgeon: Dorothyann Peng, MD;  Location: [facility]O [facility];  Service: Endoscopy;  Laterality: N/A;.  Peptic stricture, mild erosive reflux esophagitis, hiatal   FEMUR IM NAIL Left 04/28/2020    Procedure: INTRAMEDULLARY (IM) NAIL FEMORAL;  Surgeon: Marchia Bond, MD;  Location: [facility] ORS;  Service: Orthopedics;  Laterality: Left;   FLEXIBLE SIGMOIDOSCOPY  11/25/2011   MODERATE DIVERTICULOSIS/INTERNAL HEMORRHOIDS   S/P Hysterectomy  2000   benign reasons   SAVORY DILATION  06/24/2011   Procedure: SAVORY DILATION;  Surgeon: Dorothyann Peng, MD;  Location: [facility];  Service: Endoscopy;  Laterality: N/A;    Family History  Problem Relation Age of Onset   Arthritis Mother        died at age 72   Other Father        died at age 43 - horse accident   Colon cancer Neg Hx            Liver disease Neg Hx            GI problems Neg Hx             Social History   Socioeconomic History   Marital status: Widowed    Spouse name: Not on file   Number of children: 2   Years of education: 12   Highest education level: High school graduate  Occupational History   Occupation: retired    Fish farm manager: RETIRED    Comment: [employer]  Tobacco Use   Smoking status: Former    Packs/day: 1.00    Types: Cigarettes   Smokeless tobacco: Never   Tobacco comments:    30 yrs ago  Vaping Use   Vaping Use: Never used  Substance and Sexual Activity   Alcohol use: No   Drug use: No   Sexual activity: Not on file  Other Topics Concern   Not on file  Social History Narrative   Lives at [facility] in [place].   Right-handed.   2-3 cups caffeine per day.   Social Determinants of Health   Financial Resource Strain: Not on file  Food Insecurity: Not on file  Transportation Needs: Not on file  Physical Activity: Not on file  Stress: Not on file  Social Connections: Not on file  Intimate Partner Violence: Not on file     Physical Exam   Vitals:   07/27/21 0648 07/27/21 0700  BP: (!) 180/73 (!) 171/63  Pulse: 88 73  Resp: (!) 25 20  Temp: (!) 97.5 F (36.4 C)   SpO2: 94% 93%    CONSTITUTIONAL: Chronically ill-appearing, NAD NEURO: Awake,  right-sided gaze preference, nonverbal, not following commands EYES:  eyes equal and reactive ENT/NECK:  no LAD, no JVD CARDIO: Regular rate, well-perfused, normal S1 and S2 PULM:  CTAB no wheezing or rhonchi GI/GU:  normal bowel sounds, non-distended, non-tender MSK/SPINE:  No gross deformities, no edema SKIN:  no rash, atraumatic PSYCH: Unable to assess  *Additional and/or pertinent findings included in MDM below  Diagnostic and Interventional Summary    EKG Interpretation  Date/Time:  Tuesday July 27 2021 06:59:21 EDT Ventricular Rate:  72 PR Interval:  175 QRS Duration: 94 QT Interval:  422 QTC Calculation: 462 R Axis:   -27 Text Interpretation: Sinus rhythm Low voltage, precordial leads Left ventricular hypertrophy Confirmed by Gerlene Fee [phone]) on 07/27/2021 7:03:18 AM       Labs Reviewed  CBC - Abnormal; Notable for the following components:      Result Value   WBC 10.6 (*)    RBC 3.80 (*)    Hemoglobin 11.7 (*)    All other components within normal limits  RESP PANEL BY RT-PCR (FLU A&B, COVID) ARPGX2  DIFFERENTIAL  ETHANOL  PROTIME-INR  APTT  COMPREHENSIVE METABOLIC PANEL  RAPID URINE DRUG SCREEN, HOSP PERFORMED  URINALYSIS, ROUTINE W REFLEX MICROSCOPIC  I-STAT CHEM 8, ED    CT HEAD CODE STROKE WO CONTRAST  Final Result    CT Angio Head W or Wo Contrast    (Results Pending)  CT Angio Neck W and/or Wo Contrast    (Results Pending)  CT C-SPINE NO CHARGE    (Results Pending)    Medications  iohexol (OMNIPAQUE) 350 MG/ML injection 100 mL (75 mLs Intravenous Contrast Given 07/27/21 0634)     Procedures  /  Critical Care .Critical Care  Date/Time: 07/27/2021 7:05 AM Performed by: Maudie Flakes, MD Authorized by: Maudie Flakes, MD   Critical care provider statement:    Critical care time (minutes):  45   Critical care was necessary to treat or prevent imminent or life-threatening deterioration of the following conditions:  CNS failure or  compromise   Critical care was time spent personally by me on the following activities:  Discussions with consultants, evaluation of patient's response to treatment, examination of patient, ordering and performing treatments and interventions, ordering and review of laboratory studies, ordering and review of radiographic studies, pulse oximetry, re-evaluation of  patient's condition, obtaining history from patient or surrogate and review of old charts  ED Course and Medical Decision Making  I have reviewed the triage vital signs, the nursing notes, and pertinent available records from the EMR.  Listed above are laboratory and imaging tests that I personally ordered, reviewed, and interpreted and then considered in my medical decision making (see below for details).  Given the history of stroke, report of her being a generally functional person and having a significant change with last known normal only 2 hours ago, now presenting with signs of right-sided gaze preference and possible aphasia, code stroke was initiated, awaiting CT imaging     Getting more history, patient may actually be at her baseline according to facility.  Awaiting labs, CT imaging.  Not a tPA candidate per teleneurology.  Signed out to oncoming provider at shift change.  Barth Kirks. Sedonia Small, [place] [email or web address]  Final Clinical Impressions(s) / ED Diagnoses     ICD-10-CM   1. Altered mental status, unspecified altered mental status type  R41.82     2. Fall  W19.XXXA CT C-SPINE NO CHARGE    CT C-SPINE NO CHARGE      ED Discharge Orders     None        Discharge Instructions Discussed with and Provided to Patient:   Discharge Instructions   None       Maudie Flakes, MD 07/27/21 [phone]

## 2021-07-28 DIAGNOSIS — F32A Depression, unspecified: Secondary | ICD-10-CM | POA: Diagnosis present

## 2021-07-28 DIAGNOSIS — M810 Age-related osteoporosis without current pathological fracture: Secondary | ICD-10-CM | POA: Diagnosis present

## 2021-07-28 DIAGNOSIS — E785 Hyperlipidemia, unspecified: Secondary | ICD-10-CM | POA: Diagnosis present

## 2021-07-28 DIAGNOSIS — N184 Chronic kidney disease, stage 4 (severe): Secondary | ICD-10-CM | POA: Diagnosis present

## 2021-07-28 DIAGNOSIS — I674 Hypertensive encephalopathy: Secondary | ICD-10-CM | POA: Diagnosis present

## 2021-07-28 DIAGNOSIS — G9341 Metabolic encephalopathy: Secondary | ICD-10-CM | POA: Diagnosis present

## 2021-07-28 DIAGNOSIS — I129 Hypertensive chronic kidney disease with stage 1 through stage 4 chronic kidney disease, or unspecified chronic kidney disease: Secondary | ICD-10-CM | POA: Diagnosis present

## 2021-07-28 DIAGNOSIS — R110 Nausea: Secondary | ICD-10-CM | POA: Diagnosis present

## 2021-07-28 DIAGNOSIS — I10 Essential (primary) hypertension: Secondary | ICD-10-CM | POA: Diagnosis not present

## 2021-07-28 DIAGNOSIS — F05 Delirium due to known physiological condition: Secondary | ICD-10-CM | POA: Diagnosis not present

## 2021-07-28 DIAGNOSIS — F0390 Unspecified dementia without behavioral disturbance: Secondary | ICD-10-CM | POA: Diagnosis present

## 2021-07-28 DIAGNOSIS — E1169 Type 2 diabetes mellitus with other specified complication: Secondary | ICD-10-CM | POA: Diagnosis present

## 2021-07-28 DIAGNOSIS — K219 Gastro-esophageal reflux disease without esophagitis: Secondary | ICD-10-CM | POA: Diagnosis present

## 2021-07-28 DIAGNOSIS — R4182 Altered mental status, unspecified: Secondary | ICD-10-CM | POA: Diagnosis present

## 2021-07-28 DIAGNOSIS — Z8673 Personal history of transient ischemic attack (TIA), and cerebral infarction without residual deficits: Secondary | ICD-10-CM | POA: Diagnosis not present

## 2021-07-28 DIAGNOSIS — R531 Weakness: Secondary | ICD-10-CM | POA: Diagnosis present

## 2021-07-28 DIAGNOSIS — D638 Anemia in other chronic diseases classified elsewhere: Secondary | ICD-10-CM | POA: Diagnosis present

## 2021-07-28 DIAGNOSIS — K5909 Other constipation: Secondary | ICD-10-CM | POA: Diagnosis present

## 2021-07-28 DIAGNOSIS — H409 Unspecified glaucoma: Secondary | ICD-10-CM | POA: Diagnosis present

## 2021-07-28 DIAGNOSIS — Z66 Do not resuscitate: Secondary | ICD-10-CM | POA: Diagnosis present

## 2021-07-28 DIAGNOSIS — Z9049 Acquired absence of other specified parts of digestive tract: Secondary | ICD-10-CM | POA: Diagnosis not present

## 2021-07-28 DIAGNOSIS — D538 Other specified nutritional anemias: Secondary | ICD-10-CM | POA: Diagnosis not present

## 2021-07-28 DIAGNOSIS — F419 Anxiety disorder, unspecified: Secondary | ICD-10-CM | POA: Diagnosis present

## 2021-07-28 DIAGNOSIS — E1122 Type 2 diabetes mellitus with diabetic chronic kidney disease: Secondary | ICD-10-CM | POA: Diagnosis present

## 2021-07-28 DIAGNOSIS — Z8261 Family history of arthritis: Secondary | ICD-10-CM | POA: Diagnosis not present

## 2021-07-28 DIAGNOSIS — Z20822 Contact with and (suspected) exposure to covid-19: Secondary | ICD-10-CM | POA: Diagnosis present

## 2021-07-28 DIAGNOSIS — R4701 Aphasia: Secondary | ICD-10-CM | POA: Diagnosis present

## 2021-07-28 DIAGNOSIS — E860 Dehydration: Secondary | ICD-10-CM | POA: Diagnosis present

## 2021-07-28 LAB — BASIC METABOLIC PANEL WITH GFR
Anion gap: 7 (ref 5–15)
BUN: 35 mg/dL — ABNORMAL HIGH (ref 8–23)
CO2: 22 mmol/L (ref 22–32)
Calcium: 8.3 mg/dL — ABNORMAL LOW (ref 8.9–10.3)
Chloride: 113 mmol/L — ABNORMAL HIGH (ref 98–111)
Creatinine, Ser: 1.65 mg/dL — ABNORMAL HIGH (ref 0.44–1.00)
GFR, Estimated: 29 mL/min — ABNORMAL LOW
Glucose, Bld: 100 mg/dL — ABNORMAL HIGH (ref 70–99)
Potassium: 3.7 mmol/L (ref 3.5–5.1)
Sodium: 142 mmol/L (ref 135–145)

## 2021-07-28 LAB — LIPID PANEL
Cholesterol: 196 mg/dL (ref 0–200)
HDL: 32 mg/dL — ABNORMAL LOW
LDL Cholesterol: 123 mg/dL — ABNORMAL HIGH (ref 0–99)
Total CHOL/HDL Ratio: 6.1 ratio
Triglycerides: 203 mg/dL — ABNORMAL HIGH
VLDL: 41 mg/dL — ABNORMAL HIGH (ref 0–40)

## 2021-07-28 LAB — CBC
HCT: 35.9 % — ABNORMAL LOW (ref 36.0–46.0)
Hemoglobin: 11.3 g/dL — ABNORMAL LOW (ref 12.0–15.0)
MCH: 30.1 pg (ref 26.0–34.0)
MCHC: 31.5 g/dL (ref 30.0–36.0)
MCV: 95.7 fL (ref 80.0–100.0)
Platelets: 208 10*3/uL (ref 150–400)
RBC: 3.75 MIL/uL — ABNORMAL LOW (ref 3.87–5.11)
RDW: 13.8 % (ref 11.5–15.5)
WBC: 5.7 10*3/uL (ref 4.0–10.5)
nRBC: 0 % (ref 0.0–0.2)

## 2021-07-28 LAB — T4, FREE: Free T4: 1.15 ng/dL — ABNORMAL HIGH (ref 0.61–1.12)

## 2021-07-28 LAB — VITAMIN B12: Vitamin B-12: 2578 pg/mL — ABNORMAL HIGH (ref 180–914)

## 2021-07-28 LAB — TSH: TSH: 1.913 u[IU]/mL (ref 0.350–4.500)

## 2021-07-28 LAB — FOLATE: Folate: 8.7 ng/mL (ref >5.9)

## 2021-07-28 LAB — AMMONIA: Ammonia: 15 umol/L (ref 9–35)

## 2021-07-28 MED ORDER — POTASSIUM CHLORIDE IN NACL 20-0.9 MEQ/L-% IV SOLN: INTRAVENOUS | Status: DC

## 2021-07-28 MED ORDER — FUROSEMIDE 10 MG/ML IJ SOLN
40.0000 mg | Freq: Once | INTRAMUSCULAR | Status: AC
  Administered 2021-07-28: 40 mg via INTRAVENOUS
  Filled 2021-07-28: qty 4

## 2021-07-28 NOTE — Procedures (Signed)
Patient Name: Nancy Medina  MRN: [id number]  Epilepsy Attending: Lora Havens  Referring Physician/Provider: Dr Irwin Brakeman Date: 07/27/2021 Duration: 24.34 mins  Patient history: 87 year old female with altered mental status.  EEG to assess for seizures.  Level of alertness: Awake  AEDs during EEG study: None  Technical aspects: This EEG study was done with scalp electrodes positioned according to the 10-20 International system of electrode placement. Electrical activity was acquired at a sampling rate of 500Hz  and reviewed with a high frequency filter of 70Hz  and a low frequency filter of 1Hz . EEG data were recorded continuously and digitally stored.   Description: The posterior dominant rhythm consists of 7.5 Hz activity of moderate voltage (25-35 uV) seen predominantly in posterior head regions, symmetric and reactive to eye opening and eye closing.  Hyperventilation and photic stimulation were not performed.     IMPRESSION: This study is within normal limits. No seizures or epileptiform discharges were seen throughout the recording.  Bliss Tsang Barbra Sarks

## 2021-07-28 NOTE — Evaluation (Signed)
Physical Therapy Evaluation Patient Details Name: Nancy Medina MRN: [id number] DOB: [birth year] Today's Date: 07/28/2021   History of Present Illness  Nancy Medina is a 87 y.o. female with medical history significant for cerebrovascular disease, hypertension, hyperlipidemia, type 2 diabetes mellitus who is a long-term resident at [facility] assisted living facility apparently fell out of bed and was found on the floor early this morning by staff members.  Patient apparently was noted to have a right-sided gaze preference and was not talking and there was concern that she was aphasic.  Staff reported that there may been a short period of time where she was unresponsive.  This had resolved by the time she arrived.  She was evaluated in the emergency department and showing bilateral leg weakness.  CT head was negative for any acute process.  Her daughter says that she is not completely at her baseline at this time.  However she appears to be improving daily with supportive measures.  Code stroke was called in emergency department and patient was evaluated by telemetry neurologist.  He recommended admission for MRI brain and blood pressure management.  Patient was noted to have elevated blood pressure on arrival.  He recommended considering EEG.  Also recommended CBC CMP.  tPA was not administered.  He recommended neurochecks swallow evaluation IV fluids euglycemia and initiated or continue aspirin 81 mg daily.   Clinical Impression  Patient functioning near baseline for functional mobility and gait demonstrating fair/good return for transfers and ambulation in room/hallway using RW without loss of balance, appears slightly confused, but able to follow directions consistently and tolerated sitting up in chair after therapy - nursing staff aware.  Patient will benefit from continued physical therapy in hospital and recommended venue below to increase strength, balance, endurance for safe ADLs and gait.      Follow Up Recommendations Home health PT;Supervision for mobility/OOB;Supervision - Intermittent    Equipment Recommendations  None recommended by PT    Recommendations for Other Services       Precautions / Restrictions Precautions Precautions: Fall Restrictions Weight Bearing Restrictions: No      Mobility  Bed Mobility               General bed mobility comments: as per OT notes    Transfers                 General transfer comment: as per OT notes  Ambulation/Gait Ambulation/Gait assistance: Min guard Gait Distance (Feet): 70 Feet Assistive device: Rolling walker (2 wheeled) Gait Pattern/deviations: Decreased step length - left;Decreased stance time - right;Decreased stride length;Trunk flexed Gait velocity: decreased   General Gait Details: slow slightly labored cadence without loss of balance, limited mostly due to fatigue  Stairs            Wheelchair Mobility    Modified Rankin (Stroke Patients Only)       Balance Overall balance assessment: Needs assistance Sitting-balance support: Feet supported;No upper extremity supported Sitting balance-Leahy Scale: Good Sitting balance - Comments: at EOB   Standing balance support: Bilateral upper extremity supported;During functional activity Standing balance-Leahy Scale: Fair Standing balance comment: using RW                             Pertinent Vitals/Pain Pain Assessment: No/denies pain    Home Living Family/patient expects to be discharged to:: Assisted living  Home Equipment: [place] - 2 wheels;Wheelchair - manual      Prior Function Level of Independence: Needs assistance   Gait / Transfers Assistance Needed: Household ambulator with RW  ADL's / Homemaking Assistance Needed: assisted PRN by ALF staff        Hand Dominance   Dominant Hand: Right    Extremity/Trunk Assessment   Upper Extremity Assessment Upper Extremity Assessment:  Defer to OT evaluation    Lower Extremity Assessment Lower Extremity Assessment: Generalized weakness    Cervical / Trunk Assessment Cervical / Trunk Assessment: Kyphotic  Communication   Communication: No difficulties  Cognition Arousal/Alertness: Awake/alert Behavior During Therapy: WFL for tasks assessed/performed Overall Cognitive Status: No family/caregiver present to determine baseline cognitive functioning                                        General Comments      Exercises     Assessment/Plan    PT Assessment Patient needs continued PT services  PT Problem List Decreased strength;Decreased activity tolerance;Decreased balance;Decreased mobility       PT Treatment Interventions DME instruction;Gait training;Stair training;Functional mobility training;Therapeutic activities;Therapeutic exercise;Balance training;Patient/family education    PT Goals (Current goals can be found in the Care Plan section)  Acute Rehab PT Goals Patient Stated Goal: return home PT Goal Formulation: With patient Time For Goal Achievement: 08/04/21 Potential to Achieve Goals: Good    Frequency Min 3X/week   Barriers to discharge        Co-evaluation PT/OT/SLP Co-Evaluation/Treatment: Yes Reason for Co-Treatment: To address functional/ADL transfers PT goals addressed during session: Mobility/safety with mobility;Balance;Proper use of DME         AM-PAC PT "6 Clicks" Mobility  Outcome Measure Help needed turning from your back to your side while in a flat bed without using bedrails?: None Help needed moving from lying on your back to sitting on the side of a flat bed without using bedrails?: None Help needed moving to and from a bed to a chair (including a wheelchair)?: A Little Help needed standing up from a chair using your arms (e.g., wheelchair or bedside chair)?: A Little Help needed to walk in hospital room?: A Little Help needed climbing 3-5 steps with a  railing? : A Lot 6 Click Score: 19    End of Session   Activity Tolerance: Patient tolerated treatment well;Patient limited by fatigue Patient left: in chair;with call bell/phone within reach;with chair alarm set Nurse Communication: Mobility status PT Visit Diagnosis: Unsteadiness on feet (R26.81);Other abnormalities of gait and mobility (R26.89);Muscle weakness (generalized) (M62.81)    Time: 1771-1657 PT Time Calculation (min) (ACUTE ONLY): 25 min   Charges:   PT Evaluation $PT Eval Moderate Complexity: 1 Mod PT Treatments $Therapeutic Activity: 23-37 mins        2:29 PM, 07/28/21 Lonell Grandchild, MPT Physical Therapist with [facility] 336 [phone] office [phone] mobile phone

## 2021-07-28 NOTE — Evaluation (Signed)
Occupational Therapy Evaluation Patient Details Name: Nancy Medina MRN: [id number] DOB: 06/20/1925 Today's Date: 07/28/2021    History of Present Illness Nancy Medina is a 87 y.o. female with medical history significant for cerebrovascular disease, hypertension, hyperlipidemia, type 2 diabetes mellitus who is a long-term resident at [facility] assisted living facility apparently fell out of bed and was found on the floor early this morning by staff members.  Patient apparently was noted to have a right-sided gaze preference and was not talking and there was concern that she was aphasic.  Staff reported that there may been a short period of time where she was unresponsive.  This had resolved by the time she arrived.  She was evaluated in the emergency department and showing bilateral leg weakness.  CT head was negative for any acute process.  Her daughter says that she is not completely at her baseline at this time.  However she appears to be improving daily with supportive measures.  Code stroke was called in emergency department and patient was evaluated by telemetry neurologist.  He recommended admission for MRI brain and blood pressure management.  Patient was noted to have elevated blood pressure on arrival.  He recommended considering EEG.  Also recommended CBC CMP.  tPA was not administered.  He recommended neurochecks swallow evaluation IV fluids euglycemia and initiated or continue aspirin 81 mg daily.   Clinical Impression   Pt agreeable to OT/PT co-evaluation. Pt not oriented to place or year, appearing pleasantly confused. Pt required SPV assist for supine to sit bed mobility and min G assist with verbal cuing for sit to stand and ambulatory transfer from EOB to chair using RW. Pt demonstrates B UE weakness with ~75% of available range for shoulder flexion A/ROM. Pt left in chair with chair alarm set. Pt will benefit from continued OT in the hospital and recommended venue below to increase  strength, balance, and endurance for safe ADL's.       Follow Up Recommendations  Home health OT;Supervision - Intermittent (SPV for mobility)    Equipment Recommendations  None recommended by OT           Precautions / Restrictions Precautions Precautions: Fall Restrictions Weight Bearing Restrictions: No      Mobility Bed Mobility Overal bed mobility: Needs Assistance Bed Mobility: Supine to Sit     Supine to sit: Supervision     General bed mobility comments: slow labored movement    Transfers Overall transfer level: Needs assistance Equipment used: Rolling walker (2 wheeled) Transfers: Sit to/from [facility] Sit to Stand: Min guard Stand pivot transfers: Min guard       General transfer comment: slow labored movement during ambulation; verbal cuing for safety    Balance Overall balance assessment: Needs assistance Sitting-balance support: Feet supported;No upper extremity supported Sitting balance-Leahy Scale: Good Sitting balance - Comments: at EOB   Standing balance support: Bilateral upper extremity supported;During functional activity Standing balance-Leahy Scale: Fair Standing balance comment: poor to fair using RW                           ADL either performed or assessed with clinical judgement   ADL Overall ADL's : Needs assistance/impaired                     Lower Body Dressing: Total assistance;Sitting/lateral leans Lower Body Dressing Details (indicate cue type and reason): donning socks seated at EOB Toilet Transfer:  Min Marine scientist Details (indicate cue type and reason): simulated via EOB to chair ambulatory transfer with RW                 Vision Baseline Vision/History: 1 Wears glasses Ability to See in Adequate Light: 0 Adequate Patient Visual Report: No change from baseline Vision Assessment?: Yes Tracking/Visual Pursuits: Able to track stimulus in all quads  without difficulty Convergence: Other (comment) (No convergence noted when prompted to follow pen to nose.)                Pertinent Vitals/Pain Pain Assessment: Faces Faces Pain Scale: No hurt     Hand Dominance Right   Extremity/Trunk Assessment Upper Extremity Assessment Upper Extremity Assessment: Generalized weakness   Lower Extremity Assessment Lower Extremity Assessment: Defer to PT evaluation   Cervical / Trunk Assessment Cervical / Trunk Assessment: Kyphotic   Communication Communication Communication: No difficulties   Cognition Arousal/Alertness: Awake/alert Behavior During Therapy: WFL for tasks assessed/performed Overall Cognitive Status: No family/caregiver present to determine baseline cognitive functioning                                                      Home Living Family/patient expects to be discharged to:: Assisted living                             Home Equipment: [place] - 2 wheels;Wheelchair - manual          Prior Functioning/Environment Level of Independence: Needs assistance  Gait / Transfers Assistance Needed: Household ambulator with RW ADL's / Homemaking Assistance Needed: assisted PRN by ALF staff            OT Problem List: Decreased strength;Decreased range of motion;Decreased activity tolerance;Impaired balance (sitting and/or standing)      OT Treatment/Interventions: Self-care/ADL training;Therapeutic exercise;Balance training;Patient/family education;Therapeutic activities;DME and/or AE instruction    OT Goals(Current goals can be found in the care plan section) Acute Rehab OT Goals Patient Stated Goal: return home OT Goal Formulation: With patient Time For Goal Achievement: 08/11/21 Potential to Achieve Goals: Good  OT Frequency: Min 2X/week               Co-evaluation PT/OT/SLP Co-Evaluation/Treatment: Yes Reason for Co-Treatment: To address functional/ADL transfers   OT  goals addressed during session: ADL's and self-care      AM-PAC OT "6 Clicks" Daily Activity     Outcome Measure Help from another person eating meals?: None Help from another person taking care of personal grooming?: A Little Help from another person toileting, which includes using toliet, bedpan, or urinal?: A Little Help from another person bathing (including washing, rinsing, drying)?: A Little Help from another person to put on and taking off regular upper body clothing?: A Little Help from another person to put on and taking off regular lower body clothing?: A Lot 6 Click Score: 18   End of Session Equipment Utilized During Treatment: Rolling walker  Activity Tolerance: Patient tolerated treatment well Patient left: in chair;with chair alarm set;with call bell/phone within reach  OT Visit Diagnosis: Unsteadiness on feet (R26.81);History of falling (Z91.81);Muscle weakness (generalized) (M62.81)                Time: 5465-6812 OT Time Calculation (min): 17 min Charges:  OT General Charges $OT Visit: 1 Visit OT Evaluation $OT Eval Low Complexity: 1 Low  Oskar Cretella OT, MOT  Larey Seat 07/28/2021, 9:25 AM

## 2021-07-28 NOTE — Progress Notes (Signed)
PROGRESS NOTE  Nancy Medina:[id number] DOB: 11-Feb-1925 DOA: 07/27/2021 PCP: Lemmie Evens, MD  Brief History:  87 y.o. female with medical history significant for cerebrovascular disease, hypertension, hyperlipidemia, type 2 diabetes mellitus who is a long-term resident at [facility] assisted living facility apparently fell out of bed and was found on the floor early this morning by staff members.  Pt is a poor historian.  Patient apparently was noted to have a right-sided gaze preference and was not talking and there was concern that she was aphasic.  Staff reported that there may been a short period of time where she was unresponsive.  This had resolved by the time she arrived.  She was evaluated in the emergency department and showing bilateral leg weakness.  CT head was negative for any acute process.  Her daughter says that she is not completely at her baseline at this time.  However she appears to be improving daily with supportive measures.  Code stroke was called in emergency department and patient was evaluated by telemetry neurologist.  He recommended admission for MRI brain and blood pressure management.  Patient was noted to have elevated blood pressure on arrival.  He recommended considering EEG.  Also recommended CBC CMP.  tPA was not administered.  He recommended neurochecks swallow evaluation IV fluids euglycemia and initiated or continue aspirin 81 mg daily.    Assessment/Plan: Acute metabolic encephalopathy - -due to dehydration/hypertensive encephalopathy -concerned about seizure -hold remeron/klonopin -neurology consult -EEG--no epileptiform potential -UA--no significant pyuria -B12--2578 -folate--8.7 -TSH 1.913 -MRI brain--no acute findings -CTA head--no LVO -CTA--neck--no hemodynamically significant stenosis -Echo --EF 60-65%, no WMA, mild AS -CT brain--no acute findings -mental status gradually improving with IVF   Type 2 diabetes mellitus with renal  complications -X9B--7.1   Uncontrolled hypertension- -continue amlodipine and hydralazine -hold amaryl   cognitive impairment -Encourage family to stay at bedside as much as possible. -at risk for hospital delirium   Hyperlipidemia -resume home medications -LDL 123   Chronic constipation - laxatives ordered as needed.       Stage IV CKD  -baseline creatinine 1.7-1.8      Status is: Inpatient  Remains inpatient appropriate because:Inpatient level of care appropriate due to severity of illness  Dispo: The patient is from: ALF              Anticipated d/c is to: ALF              Patient currently is not medically stable to d/c.   Difficult to place patient No        Family Communication:   left VM for daughter  Consultants:  neurology  Code Status:  DNR  DVT Prophylaxis:  [place] Lovenox   Procedures: As Listed in Progress Note Above  Antibiotics: None      Subjective: Patient denies fevers, chills, headache, chest pain, dyspnea, nausea, vomiting, diarrhea, abdominal pain, dysuria, hematuria, hematochezia, and melena.   Objective: Vitals:   07/27/21 1800 07/27/21 1842 07/28/21 0433 07/28/21 1442  BP: (!) 141/66 (!) 178/84 (!) 161/89 132/67  Pulse: 78 93 87 77  Resp: 16 18 15 17   Temp:  98.1 F (36.7 C) (!) 97.4 F (36.3 C) 97.6 F (36.4 C)  TempSrc:  Oral Oral Oral  SpO2: 92% 97% 93% 96%  Weight:  72.6 kg    Height:  5\' 6"  (1.676 m)      Intake/Output Summary (Last 24 hours) at 07/28/2021 1753  Last data filed at 07/28/2021 1300 Gross per 24 hour  Intake 360 ml  Output --  Net 360 ml   Weight change:  Exam:  General:  Pt is alert, follows commands appropriately, not in acute distress HEENT: No icterus, No thrush, No neck mass, [place]/AT Cardiovascular: RRR, S1/S2, no rubs, no gallops Respiratory: CTA bilaterally, no wheezing, no crackles, no rhonchi Abdomen: Soft/+BS, non tender, non distended, no guarding Extremities: Non pitting edema, No  lymphangitis, No petechiae, No rashes, no synovitis   Data Reviewed: I have personally reviewed following labs and imaging studies Basic Metabolic Panel: Recent Labs  Lab 07/27/21 0656 07/28/21 0559  NA 138 142  K 3.7 3.7  CL 108 113*  CO2 22 22  GLUCOSE 117* 100*  BUN 41* 35*  CREATININE 1.78* 1.65*  CALCIUM 8.4* 8.3*   Liver Function Tests: Recent Labs  Lab 07/27/21 0656  AST 13*  ALT 10  ALKPHOS 51  BILITOT 0.7  PROT 6.5  ALBUMIN 3.8   No results for input(s): LIPASE, AMYLASE in the last 168 hours. Recent Labs  Lab 07/28/21 0620  AMMONIA 15   Coagulation Profile: Recent Labs  Lab 07/27/21 0656  INR 1.0   CBC: Recent Labs  Lab 07/27/21 0656 07/28/21 0620  WBC 10.6* 5.7  NEUTROABS 7.3  --   HGB 11.7* 11.3*  HCT 36.2 35.9*  MCV 95.3 95.7  PLT 213 208   Cardiac Enzymes: No results for input(s): CKTOTAL, CKMB, CKMBINDEX, TROPONINI in the last 168 hours. BNP: Invalid input(s): POCBNP CBG: No results for input(s): GLUCAP in the last 168 hours. HbA1C: Recent Labs    07/27/21 0656  HGBA1C 6.1*   Urine analysis:    Component Value Date/Time   COLORURINE YELLOW 07/27/2021 1750   APPEARANCEUR CLEAR 07/27/2021 1750   LABSPEC 1.015 07/27/2021 1750   PHURINE 5.5 07/27/2021 1750   GLUCOSEU NEGATIVE 07/27/2021 1750   HGBUR TRACE (A) 07/27/2021 1750   HGBUR negative 06/04/2008 0811   BILIRUBINUR NEGATIVE 07/27/2021 1750   KETONESUR NEGATIVE 07/27/2021 1750   PROTEINUR 100 (A) 07/27/2021 1750   UROBILINOGEN 0.2 06/04/2008 0811   NITRITE NEGATIVE 07/27/2021 1750   LEUKOCYTESUR NEGATIVE 07/27/2021 1750   Sepsis Labs: @LABRCNTIP (procalcitonin:4,lacticidven:4) ) Recent Results (from the past 240 hour(s))  Resp Panel by RT-PCR (Flu A&B, Covid) Nasopharyngeal Swab     Status: None   Collection Time: 07/27/21  6:45 AM   Specimen: Nasopharyngeal Swab; Nasopharyngeal(NP) swabs in vial transport medium  Result Value Ref Range Status   SARS Coronavirus  2 by RT PCR NEGATIVE NEGATIVE Final    Comment: (NOTE) SARS-CoV-2 target nucleic acids are NOT DETECTED.  The SARS-CoV-2 RNA is generally detectable in upper respiratory specimens during the acute phase of infection. The lowest concentration of SARS-CoV-2 viral copies this assay can detect is 138 copies/mL. A negative result does not preclude SARS-Cov-2 infection and should not be used as the sole basis for treatment or other patient management decisions. A negative result may occur with  improper specimen collection/handling, submission of specimen other than nasopharyngeal swab, presence of viral mutation(s) within the areas targeted by this assay, and inadequate number of viral copies(<138 copies/mL). A negative result must be combined with clinical observations, patient history, and epidemiological information. The expected result is Negative.  Fact Sheet for Patients:  EntrepreneurPulse.com.au  Fact Sheet for Healthcare Providers:  IncredibleEmployment.be  This test is no t yet approved or cleared by the [place] FDA and  has been authorized for detection  and/or diagnosis of SARS-CoV-2 by FDA under an Emergency Use Authorization (EUA). This EUA will remain  in effect (meaning this test can be used) for the duration of the COVID-19 declaration under Section 564(b)(1) of the Act, 21 U.S.C.section 360bbb-3(b)(1), unless the authorization is terminated  or revoked sooner.       Influenza A by PCR NEGATIVE NEGATIVE Final   Influenza B by PCR NEGATIVE NEGATIVE Final    Comment: (NOTE) The Xpert Xpress SARS-CoV-2/FLU/RSV plus assay is intended as an aid in the diagnosis of influenza from Nasopharyngeal swab specimens and should not be used as a sole basis for treatment. Nasal washings and aspirates are unacceptable for Xpert Xpress SARS-CoV-2/FLU/RSV testing.  Fact Sheet for Patients: EntrepreneurPulse.com.au  Fact  Sheet for Healthcare Providers: IncredibleEmployment.be  This test is not yet approved or cleared by the [place] FDA and has been authorized for detection and/or diagnosis of SARS-CoV-2 by FDA under an Emergency Use Authorization (EUA). This EUA will remain in effect (meaning this test can be used) for the duration of the COVID-19 declaration under Section 564(b)(1) of the Act, 21 U.S.C. section 360bbb-3(b)(1), unless the authorization is terminated or revoked.  Performed at [facility], [address], [place], [place] [address]      Scheduled Meds:   stroke: mapping our early stages of recovery book   Does not apply Once   amLODipine  10 mg Oral Daily   aspirin  81 mg Oral Daily   brimonidine  1 drop Both Eyes Q8H   cholecalciferol  2,000 Units Oral Daily   cycloSPORINE  1 drop Both Eyes BID   dorzolamide-timolol  1 drop Both Eyes BID   enoxaparin (LOVENOX) injection  30 mg Subcutaneous Q24H   hydrALAZINE  10 mg Oral TID   mirtazapine  7.5 mg Oral QHS   Netarsudil-Latanoprost  1 drop Ophthalmic QHS   vitamin B-12  1,000 mcg Oral Daily   Continuous Infusions:  sodium chloride Stopped (07/28/21 0919)    Procedures/Studies: CT Angio Head W or Wo Contrast  Result Date: 07/27/2021 CLINICAL DATA:  Stroke/TIA, assess extracranial arteries EXAM: CT ANGIOGRAPHY HEAD AND NECK TECHNIQUE: Multidetector CT imaging of the head and neck was performed using the standard protocol during bolus administration of intravenous contrast. Multiplanar CT image reconstructions and MIPs were obtained to evaluate the vascular anatomy. Carotid stenosis measurements (when applicable) are obtained utilizing NASCET criteria, using the distal internal carotid diameter as the denominator. CONTRAST:  79mL OMNIPAQUE IOHEXOL 350 MG/ML SOLN COMPARISON:  None. FINDINGS: CTA NECK FINDINGS Aortic arch: Great vessel origins are patent.  Atherosclerosis. Right carotid system: No evidence of  dissection, stenosis (50% or greater) or occlusion. Left carotid system: No evidence of dissection, stenosis (50% or greater) or occlusion. Retropharyngeal course. Vertebral arteries: Tortuous bilaterally. No significant (greater than 50%) stenosis or evidence of dissection. Mild narrowing of the left vertebral artery origin due to calcific atherosclerosis. Skeleton: Further evaluated on concurrent CT of the cervical spine. Other neck: Multiple thyroid nodules, measuring up to 1.3 cm on the left. Not clinically significant; no follow-up imaging recommended (ref: J Am Coll Radiol. 2015 Feb;12(2): 143-50). Upper chest: Visualized lung apices are clear. Review of the MIP images confirms the above findings CTA HEAD FINDINGS Anterior circulation: Bilateral intracranial ICAs, MCAs, and ACAs are patent. Mild for age calcific atherosclerosis of bilateral intracranial ICAs without greater than 50% stenosis. Mild-to-moderate right supraclinoid ICA stenosis (approximately 30-40% stenosis). Posterior circulation: Bilateral intradural vertebral arteries, basilar artery, and posterior cerebral  arteries are patent. Dolichoectasia of the basilar artery, measuring up to 5.2 cm in diameter distally. Venous sinuses: As permitted by contrast timing, patent. Review of the MIP images confirms the above findings IMPRESSION: CTA head: 1. No large vessel occlusion. 2. Multifocal severe stenosis of the right P2 PCA. 3. Approximately 30-40% right supraclinoid ICA stenosis. 4. Dolichoectasia of the basilar artery. CTA neck: No significant (greater than 50%) stenosis. Electronically Signed   By: Margaretha Sheffield M.D.   On: 07/27/2021 07:34   CT Angio Neck W and/or Wo Contrast  Result Date: 07/27/2021 CLINICAL DATA:  Stroke/TIA, assess extracranial arteries EXAM: CT ANGIOGRAPHY HEAD AND NECK TECHNIQUE: Multidetector CT imaging of the head and neck was performed using the standard protocol during bolus administration of intravenous contrast.  Multiplanar CT image reconstructions and MIPs were obtained to evaluate the vascular anatomy. Carotid stenosis measurements (when applicable) are obtained utilizing NASCET criteria, using the distal internal carotid diameter as the denominator. CONTRAST:  70mL OMNIPAQUE IOHEXOL 350 MG/ML SOLN COMPARISON:  None. FINDINGS: CTA NECK FINDINGS Aortic arch: Great vessel origins are patent.  Atherosclerosis. Right carotid system: No evidence of dissection, stenosis (50% or greater) or occlusion. Left carotid system: No evidence of dissection, stenosis (50% or greater) or occlusion. Retropharyngeal course. Vertebral arteries: Tortuous bilaterally. No significant (greater than 50%) stenosis or evidence of dissection. Mild narrowing of the left vertebral artery origin due to calcific atherosclerosis. Skeleton: Further evaluated on concurrent CT of the cervical spine. Other neck: Multiple thyroid nodules, measuring up to 1.3 cm on the left. Not clinically significant; no follow-up imaging recommended (ref: J Am Coll Radiol. 2015 Feb;12(2): 143-50). Upper chest: Visualized lung apices are clear. Review of the MIP images confirms the above findings CTA HEAD FINDINGS Anterior circulation: Bilateral intracranial ICAs, MCAs, and ACAs are patent. Mild for age calcific atherosclerosis of bilateral intracranial ICAs without greater than 50% stenosis. Mild-to-moderate right supraclinoid ICA stenosis (approximately 30-40% stenosis). Posterior circulation: Bilateral intradural vertebral arteries, basilar artery, and posterior cerebral arteries are patent. Dolichoectasia of the basilar artery, measuring up to 5.2 cm in diameter distally. Venous sinuses: As permitted by contrast timing, patent. Review of the MIP images confirms the above findings IMPRESSION: CTA head: 1. No large vessel occlusion. 2. Multifocal severe stenosis of the right P2 PCA. 3. Approximately 30-40% right supraclinoid ICA stenosis. 4. Dolichoectasia of the basilar  artery. CTA neck: No significant (greater than 50%) stenosis. Electronically Signed   By: Margaretha Sheffield M.D.   On: 07/27/2021 07:34   MR BRAIN WO CONTRAST  Result Date: 07/27/2021 CLINICAL DATA:  Neuro deficit, acute, stroke suspected EXAM: MRI HEAD WITHOUT CONTRAST TECHNIQUE: Multiplanar, multiecho pulse sequences of the brain and surrounding structures were obtained without intravenous contrast. COMPARISON:  August 2019 FINDINGS: Motion artifact is present. Brain: There is no acute infarction or intracranial hemorrhage. There is no intracranial mass, mass effect, or edema. There is no hydrocephalus or extra-axial fluid collection. Prominence of the ventricles and sulci reflects generalized parenchymal volume loss. Chronic left occipitotemporal infarct. Additional chronic infarcts involving the basal ganglia and cerebellum bilaterally. Other patchy and confluent areas of T2 hyperintensity in the supratentorial and pontine white matter are nonspecific but likely reflect moderate to advanced chronic microvascular ischemic changes. Vascular: Major vessel flow voids at the skull base are preserved. Skull and upper cervical spine: Normal marrow signal is preserved. Sinuses/Orbits: Mild mucosal thickening. Bilateral lens replacements. Other: Sella is unremarkable.  Mastoid air cells are clear. IMPRESSION: No acute infarction, hemorrhage, or mass.  Multiple chronic infarcts and moderate to advanced chronic microvascular ischemic changes. Electronically Signed   By: Macy Mis M.D.   On: 07/27/2021 13:32   CT C-SPINE NO CHARGE  Result Date: 07/27/2021 CLINICAL DATA:  Fall W19.Merril Abbe (ICD-10-CM) EXAM: CT CERVICAL SPINE WITHOUT CONTRAST TECHNIQUE: Multidetector CT imaging of the cervical spine was performed without intravenous contrast. Multiplanar CT image reconstructions were also generated. COMPARISON:  May 09, 2018. FINDINGS: Alignment: Similar alignment. Similar straightening without substantial sagittal  subluxation. Skull base and vertebrae: No evidence of acute fracture. Mild height loss of the T2 vertebral body is favored to relate to degenerative remodeling given severe degenerative disc disease and endplate Schmorl's nodes at this level, partially imaged. Soft tissues and spinal canal: No prevertebral fluid or swelling. No visible canal hematoma. Disc levels: Multilevel degenerative disc disease and facet arthropathy in the cervical spine. Progressive degenerative disease at T1-T2, partially imaged. Upper chest: Visualized lung apices are clear. Other: Multiple thyroid nodules, measuring up to 1.3 cm on the left. Not clinically significant; no follow-up imaging recommended (ref: J Am Coll Radiol. 2015 Feb;12(2): 143-50). IMPRESSION: No evidence of acute fracture or traumatic malalignment in the cervical spine. Electronically Signed   By: Margaretha Sheffield M.D.   On: 07/27/2021 07:43   EEG adult  Result Date: 07/28/2021 Lora Havens, MD     07/28/2021 11:00 AM Patient Name: SEPTEMBER MORMILE MRN: [id number] Epilepsy Attending: Lora Havens Referring Physician/Provider: Dr Irwin Brakeman Date: 07/27/2021 Duration: 24.34 mins Patient history: 87 year old female with altered mental status.  EEG to assess for seizures. Level of alertness: Awake AEDs during EEG study: None Technical aspects: This EEG study was done with scalp electrodes positioned according to the 10-20 International system of electrode placement. Electrical activity was acquired at a sampling rate of 500Hz  and reviewed with a high frequency filter of 70Hz  and a low frequency filter of 1Hz . EEG data were recorded continuously and digitally stored. Description: The posterior dominant rhythm consists of 7.5 Hz activity of moderate voltage (25-35 uV) seen predominantly in posterior head regions, symmetric and reactive to eye opening and eye closing.  Hyperventilation and photic stimulation were not performed.   IMPRESSION: This study is within  normal limits. No seizures or epileptiform discharges were seen throughout the recording. Lora Havens   ECHOCARDIOGRAM COMPLETE  Result Date: 07/27/2021    ECHOCARDIOGRAM REPORT   Patient Name:   KENZLEE FISHBURN Date of Exam: 07/27/2021 Medical Rec #:  [id number]       Height:       62.0 in Accession #:    [id number]      Weight:       170.0 lb Date of Birth:  02/03/1925       BSA:          1.784 m Patient Age:    4 years        BP:           160/74 mmHg Patient Gender: F               HR:           81 bpm. Exam Location:  [facility] Procedure: 2D Echo, Cardiac Doppler and Color Doppler Indications:    CVA  History:        Patient has prior history of Echocardiogram examinations, most                 recent 03/20/2018. Stroke, Signs/Symptoms:Dementia; Risk  Factors:Hypertension, Diabetes, Dyslipidemia and Obesity.  Sonographer:    Dustin Flock RDCS Referring Phys: [phone] Murlean Iba  Sonographer Comments: Technically difficult study due to poor echo windows and patient is morbidly obese. IMPRESSIONS  1. Left ventricular ejection fraction, by estimation, is 60 to 65%. The left ventricle has normal function. The left ventricle has no regional wall motion abnormalities. There is moderate left ventricular hypertrophy. Left ventricular diastolic parameters are indeterminate.  2. Right ventricular systolic function is normal. The right ventricular size is normal.  3. Trivial mitral valve regurgitation. Moderate mitral annular calcification.  4. Poor acoustic windows limit study AV is thickened, calcified Peak and mean gradients through the valve are 15 and 6 mm Hg respectively Consistent with mild AS. COmpared to echo report from 2019, Mean gradient is decreased (11 to 6 mm Hg). . The aortic valve was not well visualized. Aortic valve regurgitation is mild.  5. The inferior vena cava is normal in size with greater than 50% respiratory variability, suggesting right atrial pressure of 3 mmHg.  FINDINGS  Left Ventricle: Left ventricular ejection fraction, by estimation, is 60 to 65%. The left ventricle has normal function. The left ventricle has no regional wall motion abnormalities. The left ventricular internal cavity size was normal in size. There is  moderate left ventricular hypertrophy. Left ventricular diastolic parameters are indeterminate. Right Ventricle: The right ventricular size is normal. Right vetricular wall thickness was not assessed. Right ventricular systolic function is normal. Left Atrium: Left atrial size was normal in size. Right Atrium: Right atrial size was normal in size. Pericardium: There is no evidence of pericardial effusion. Mitral Valve: Moderate mitral annular calcification. Trivial mitral valve regurgitation. Tricuspid Valve: The tricuspid valve is grossly normal. Tricuspid valve regurgitation is trivial. Aortic Valve: Poor acoustic windows limit study AV is thickened, calcified Peak and mean gradients through the valve are 15 and 6 mm Hg respectively Consistent with mild AS. COmpared to echo report from 2019, Mean gradient is decreased (11 to 6 mm Hg). The aortic valve was not well visualized. Aortic valve regurgitation is mild. Aortic regurgitation PHT measures 596 msec. Pulmonic Valve: The pulmonic valve was not well visualized. Pulmonic valve regurgitation is not visualized. Aorta: The aortic root and ascending aorta are structurally normal, with no evidence of dilitation. Venous: The inferior vena cava is normal in size with greater than 50% respiratory variability, suggesting right atrial pressure of 3 mmHg. IAS/Shunts: The interatrial septum was not assessed.  LEFT VENTRICLE PLAX 2D LVIDd:         4.09 cm  Diastology LVIDs:         2.66 cm  LV e' medial:    5.87 cm/s LV PW:         1.29 cm  LV E/e' medial:  8.3 LV IVS:        1.57 cm  LV e' lateral:   4.38 cm/s LVOT diam:     2.00 cm  LV E/e' lateral: 11.2 LV SV:         65 LV SV Index:   36 LVOT Area:     3.14 cm   RIGHT VENTRICLE RV Basal diam:  2.65 cm RV S prime:     8.92 cm/s TAPSE (M-mode): 1.6 cm LEFT ATRIUM             Index       RIGHT ATRIUM           Index LA diam:  3.20 cm 1.79 cm/m  RA Area:     10.90 cm LA Vol (A2C):   26.9 ml 15.08 ml/m RA Volume:   23.60 ml  13.23 ml/m LA Vol (A4C):   35.8 ml 20.07 ml/m LA Biplane Vol: 32.9 ml 18.44 ml/m  AORTIC VALVE LVOT Vmax:   106.00 cm/s LVOT Vmean:  69.100 cm/s LVOT VTI:    0.206 m AI PHT:      596 msec  AORTA Ao Root diam: 2.80 cm MITRAL VALVE MV Area (PHT): 4.74 cm    SHUNTS MV Decel Time: 160 msec    Systemic VTI:  0.21 m MV E velocity: 49.00 cm/s  Systemic Diam: 2.00 cm MV A velocity: 95.60 cm/s MV E/A ratio:  0.51 Dorris Carnes MD Electronically signed by Dorris Carnes MD Signature Date/Time: 07/27/2021/9:15:32 PM    Final    CT HEAD CODE STROKE WO CONTRAST  Result Date: 07/27/2021 CLINICAL DATA:  Code stroke.  May 14, 2021. EXAM: CT HEAD WITHOUT CONTRAST TECHNIQUE: Contiguous axial images were obtained from the base of the skull through the vertex without intravenous contrast. COMPARISON:  May 14, 2021. FINDINGS: Brain: No evidence of acute infarction, hemorrhage, hydrocephalus, extra-axial collection or mass lesion/mass effect. Similar prior left occipital, bilateral basal ganglia, and bilateral cerebellar infarcts. Similar additional moderate to advanced patchy and confluent white matter hypoattenuation, nonspecific but compatible with chronic microvascular ischemic disease. Vascular: No hyperdense vessel identified. Skull: Acute fracture. Sinuses/Orbits:  Clear sinuses. No acute orbital findings. Other: No mastoid effusions. ASPECTS [facility] Stroke Program Early CT Score) total score (0-10 with 10 being normal): 10. IMPRESSION: 1. No evidence of acute large vascular territory infarct or acute hemorrhage. ASPECTS is 10. 2. Similar prior left occipital, bilateral basal ganglia, and bilateral cerebellar infarcts. 3. Moderate to advanced chronic  microvascular ischemic disease. Code stroke imaging results were communicated on 07/27/2021 at 6:35 am to provider Dr. Sedonia Small via telephone, who verbally acknowledged these results. Electronically Signed   By: Margaretha Sheffield M.D.   On: 07/27/2021 06:38    Orson Eva, DO  [facility]  If 7PM-7AM, please contact night-coverage [email or web address] Password [email or web address] 07/28/2021, 5:53 PM   LOS: 0 days

## 2021-07-28 NOTE — Plan of Care (Signed)
  Problem: Acute Rehab OT Goals (only OT should resolve) Goal: Pt. Will Perform Grooming Flowsheets (Taken 07/28/2021 0927) Pt Will Perform Grooming:  with modified independence  standing  with adaptive equipment Goal: Pt. Will Perform Lower Body Dressing Flowsheets (Taken 07/28/2021 0927) Pt Will Perform Lower Body Dressing:  with supervision  with adaptive equipment  sitting/lateral leans  sit to/from stand Goal: Pt. Will Transfer To Toilet Flowsheets (Taken 07/28/2021 [phone]) Pt Will Transfer to Toilet:  with modified independence  stand pivot transfer  ambulating Goal: Pt/Caregiver Will Perform Home Exercise Program Flowsheets (Taken 07/28/2021 [phone]) Pt/caregiver will Perform Home Exercise Program:  Increased ROM  Increased strength  Both right and left upper extremity  With Supervision  Junella Domke OT, MOT

## 2021-07-28 NOTE — Progress Notes (Signed)
SLP Cancellation Note  Patient Details Name: Nancy Medina MRN: [id number] DOB: 1925-02-11   Cancelled treatment:       Reason Eval/Treat Not Completed: SLP screened, no needs identified, will sign off. SLP spoke with Pt's daughter who reports Pt's cognition has returned to baseline and that memory impairment/confusion is baseline for her. No changes in speech and language ability. MRI was negative for acute findings. Thank you for this referral.  Rosamund Nyland H. Roddie Mc, CCC-SLP Speech Language Pathologist    Wende Bushy 07/28/2021, 4:42 PM

## 2021-07-28 NOTE — TOC Initial Note (Addendum)
Transition of Care [facility]) - Initial/Assessment Note    Patient Details  Name: Nancy Medina MRN: [id number] Date of Birth: [birth year]  [facility]) CM/SW Contact:    Shade Flood, LCSW Phone Number: 07/28/2021, 11:02 AM  Clinical Narrative:                  Pt admitted from [facility] ALF. PT/OT recommending return to ALF with [facility] PT/OT at dc. Awaiting return call from [place] and also Judson Roch at [place] to update and initiate referral for [facility]. MD anticipating dc tomorrow. TOC will follow and continue to assist.  1209: Due to insurance network issues, [facility] will provide PT/OT for pt at [facility].  Expected Discharge Plan: Assisted Living Barriers to Discharge: Continued Medical Work up   Patient Goals and CMS Choice Patient states their goals for this hospitalization and ongoing recovery are:: return to ALF CMS Medicare.gov Compare Post Acute Care list provided to:: Patient Represenative (must comment) Choice offered to / list presented to : Adult Children  Expected Discharge Plan and Services Expected Discharge Plan: Assisted Living In-house Referral: Clinical Social Work   Post Acute Care Choice: [place] arrangements for the past 2 months: Assisted Living Facility                           HH Arranged: PT, OT [place] Agency: [place] Date [place]: 07/28/21   Representative spoke with at [place]: Sarah  Prior Living Arrangements/Services Living arrangements for the past 2 months: [place] Lives with:: Facility Resident Patient language and need for interpreter reviewed:: Yes Do you feel safe going back to the place where you live?: Yes      Need for Family Participation in Patient Care: No (Comment) Care giver support system in place?: Yes (comment)   Criminal Activity/Legal Involvement Pertinent to Current Situation/Hospitalization: No - Comment as needed  Activities of Daily Living Home  Assistive Devices/Equipment: Environmental consultant (specify type) ADL Screening (condition at time of admission) Patient's cognitive ability adequate to safely complete daily activities?: No Is the patient deaf or have difficulty hearing?: Yes Does the patient have difficulty seeing, even when wearing glasses/contacts?: No Does the patient have difficulty concentrating, remembering, or making decisions?: Yes Patient able to express need for assistance with ADLs?: Yes Does the patient have difficulty dressing or bathing?: Yes Independently performs ADLs?: No Communication: Independent Dressing (OT): Needs assistance Is this a change from baseline?: Pre-admission baseline Grooming: Needs assistance Is this a change from baseline?: Pre-admission baseline Feeding: Independent Bathing: Needs assistance Is this a change from baseline?: Pre-admission baseline Toileting: Needs assistance Is this a change from baseline?: Pre-admission baseline In/Out Bed: Needs assistance Is this a change from baseline?: Pre-admission baseline Walks in Home: Needs assistance Is this a change from baseline?: Pre-admission baseline Does the patient have difficulty walking or climbing stairs?: Yes Weakness of Legs: Both Weakness of Arms/Hands: None  Permission Sought/Granted                  Emotional Assessment       Orientation: : Oriented to Self, Oriented to Place Alcohol / Substance Use: Not Applicable Psych Involvement: No (comment)  Admission diagnosis:  Altered mental state [R41.82] Fall [W19.XXXA] Altered mental status, unspecified altered mental status type [R41.82] Patient Active Problem List   Diagnosis Date Noted   Altered mental state 07/27/2021   Hypertension associated with stage 4 chronic kidney disease  due to type 2 diabetes mellitus ([place]) 05/01/2020   Hyperlipidemia associated with type 2 diabetes mellitus ([place]) 05/01/2020   CKD stage 4 due to type 2 diabetes mellitus ([place]) 05/01/2020    Acute blood loss anemia 05/01/2020   Bilateral lower extremity edema 05/01/2020   Increased intraocular pressure, bilateral 05/01/2020   Chronic constipation 05/01/2020   Displaced intertrochanteric fracture of left femur, subsequent encounter for closed fracture with routine healing 04/27/2020   Gait abnormality 09/13/2018   Mild cognitive impairment 09/13/2018   Uncontrolled hypertension 07/06/2018   Fall 07/04/2018   Scalp laceration 07/04/2018   Acute bronchitis due to Rhinovirus 03/20/2018   Diabetic hyperosmolar non-ketotic state ([place]) 03/18/2018   Acute respiratory failure with hypoxia ([place]) 03/18/2018   CKD (chronic kidney disease) stage 4, GFR 15-29 ml/min ([place]) 03/18/2018   Acute lower UTI 02/09/2018   Nausea 02/09/2018   Constipation 02/09/2018   Diarrhea 11/25/2011   Lymphocytic colitis 06/15/2011   Esophageal dysphagia 06/15/2011   UNSPECIFIED ANEMIA 04/27/2009   ACHILLES TENDINITIS 10/27/2008   PERIPHERAL EDEMA 10/14/2008   DEGENERATIVE [place] DISEASE 01/15/2008   Type 2 diabetes mellitus with hyperlipidemia ([place]) 12/13/2007   Hyperlipidemia 10/11/2006   OBESITY NOS 10/11/2006   Anxiety 10/11/2006   Depression 10/11/2006   Unspecified glaucoma 10/11/2006   BENIGN POSITIONAL VERTIGO 10/11/2006   Essential hypertension 10/11/2006   GERD 10/11/2006   OVERACTIVE BLADDER 10/11/2006   OSTEOPOROSIS 10/11/2006   BREAST CANCER, HX OF 10/11/2006   CEREBROVASCULAR ACCIDENT, HX OF 10/11/2006   PCP:  Lemmie Evens, MD Pharmacy:   [facility], [place] - [address] [place] [address] Phone: [phone] Fax: [phone]  [facility] - [place], [place] - [address] [place] [address] Phone: [phone] Fax: [phone]     Social Determinants of Health (SDOH) Interventions    Readmission Risk Interventions No flowsheet data found.

## 2021-07-28 NOTE — Care Management Obs Status (Signed)
[place] NOTIFICATION   Patient Details  Name: BLASA RAISCH MRN: [id number] Date of Birth: 04-Mar-1925   Medicare Observation Status Notification Given:  Yes    Shade Flood, LCSW 07/28/2021, 12:35 PM

## 2021-07-28 NOTE — Plan of Care (Signed)
  Problem: Acute Rehab PT Goals(only PT should resolve) Goal: Pt Will Go Supine/Side To Sit Outcome: Progressing Flowsheets (Taken 07/28/2021 1430) Pt will go Supine/Side to Sit:  with modified independence  with supervision Goal: Patient Will Transfer Sit To/From Stand Outcome: Progressing Flowsheets (Taken 07/28/2021 1430) Patient will transfer sit to/from stand: with supervision Goal: Pt Will Transfer Bed To Chair/Chair To Bed Outcome: Progressing Flowsheets (Taken 07/28/2021 1430) Pt will Transfer Bed to Chair/Chair to Bed: with supervision Goal: Pt Will Ambulate Outcome: Progressing Flowsheets (Taken 07/28/2021 1430) Pt will Ambulate:  100 feet  with supervision  with rolling walker   2:31 PM, 07/28/21 Lonell Grandchild, MPT Physical Therapist with [facility] [phone] office [phone] mobile phone

## 2021-07-29 LAB — BASIC METABOLIC PANEL
Anion gap: 8 (ref 5–15)
BUN: 36 mg/dL — ABNORMAL HIGH (ref 8–23)
CO2: 22 mmol/L (ref 22–32)
Calcium: 8.3 mg/dL — ABNORMAL LOW (ref 8.9–10.3)
Chloride: 112 mmol/L — ABNORMAL HIGH (ref 98–111)
Creatinine, Ser: 1.8 mg/dL — ABNORMAL HIGH (ref 0.44–1.00)
GFR, Estimated: 26 mL/min — ABNORMAL LOW (ref >60)
Glucose, Bld: 128 mg/dL — ABNORMAL HIGH (ref 70–99)
Potassium: 3.6 mmol/L (ref 3.5–5.1)
Sodium: 142 mmol/L (ref 135–145)

## 2021-07-29 LAB — MAGNESIUM: Magnesium: 2 mg/dL (ref 1.7–2.4)

## 2021-07-29 NOTE — TOC Transition Note (Signed)
Transition of Care [facility]) - CM/SW Discharge Note   Patient Details  Name: Nancy Medina MRN: [id number] Date of Birth: 1925/01/18  [facility] CM/SW Contact:  Natasha Bence, LCSW Phone Number: 07/29/2021, 12:14 PM   Clinical Narrative:    CSW notified of patient's readiness for discharge. Georgina Snell with [facility] agreeable provide [facility] services upon discharge. [place] agreeable to take patient at facility. CSW faxed FL2 and d/c summary. Nurse to call report. Daughter to provide transportation. TOC signing off.    Final next level of care: Assisted Living Barriers to Discharge: Barriers Resolved   Patient Goals and CMS Choice Patient states their goals for this hospitalization and ongoing recovery are:: Return to ALF CMS Medicare.gov Compare Post Acute Care list provided to:: Patient Choice offered to / list presented to : Patient  Discharge Placement                    Patient and family notified of of transfer: 07/29/21  Discharge Plan and Services In-house Referral: Clinical Social Work   Post Acute Care Choice: Home Health                    HH Arranged: PT, RN [facility] Agency: [place] Date [facility] Agency Contacted: 07/29/21 Time [place]: 1214 Representative spoke with at [place]: [place] Determinants of Health ([place]) Interventions     Readmission Risk Interventions No flowsheet data found.

## 2021-07-29 NOTE — Discharge Summary (Signed)
Physician Discharge Summary  Nancy Medina YQI:[id number] DOB: 1925-02-09 DOA: 07/27/2021  PCP: Lemmie Evens, MD  Admit date: 07/27/2021 Discharge date: 07/29/2021  Admitted From: ALF--[facility] Disposition:  ALF  Recommendations for Outpatient Follow-up:  Follow up with PCP in 1-2 weeks Please obtain BMP/CBC in one week Ambulatory referral made for outpatient neurology-Dr. Barbourmeade: HHPT   Discharge Condition: Stable CODE STATUS:DNR Diet recommendation: Heart Healthy / Carb Modified    Brief/Interim Summary: 87 y.o. female with medical history significant for cerebrovascular disease, hypertension, hyperlipidemia, type 2 diabetes mellitus who is a long-term resident at [facility] assisted living facility apparently fell out of bed and was found on the floor early this morning by staff members.  Pt is a poor historian.  Patient apparently was noted to have a right-sided gaze preference and was not talking and there was concern that she was aphasic.  Staff reported that there may been a short period of time where she was unresponsive.  This had resolved by the time she arrived.  She was evaluated in the emergency department and showing bilateral leg weakness.  CT head was negative for any acute process.  Her daughter says that she is not completely at her baseline at this time.  However she appears to be improving daily with supportive measures.  Code stroke was called in emergency department and patient was evaluated by telemetry neurologist.  He recommended admission for MRI brain and blood pressure management.  Patient was noted to have elevated blood pressure on arrival.  He recommended considering EEG.  Also recommended CBC CMP.  tPA was not administered.  He recommended neurochecks swallow evaluation IV fluids euglycemia and initiated or continue aspirin 81 mg daily. The patient was continued on IVF and her anti-HTN meds were restarted.  Stroke work up as stated below was  negative.  EEG was negative.  Patient's mental status returned to baseline with occasional episodes of agitated likely due to hospital delirium  Outpt referral was made for neurology.  Discharge Diagnoses:  Acute metabolic encephalopathy - -due to dehydration/hypertensive encephalopathy -concerned about seizure -held remeron/klonopin initially>>restart after discharge as mental status back to baseline -EEG--no epileptiform potential -UA--no significant pyuria -B12--2578 -folate--8.7 -TSH 1.913 -MRI brain--no acute findings -CTA head--no LVO -CTA--neck--no hemodynamically significant stenosis -Echo --EF 60-65%, no WMA, mild AS -CT brain--no acute findings -mental status gradually improving with IVF   Type 2 diabetes mellitus with renal complications -L8V--5.6   Uncontrolled hypertension- -continue amlodipine and hydralazine -BP acceptable on above meds   cognitive impairment -Encourage family to stay at bedside as much as possible. -had occasional episodes of agitation but was redirectable   Hyperlipidemia -resume home medications -LDL 123   Chronic constipation - laxatives ordered as needed.       Stage IV CKD  -baseline creatinine 1.7-1.8 -serum creatinine 1.80 on day ofd /c       Discharge Instructions  Discharge Instructions     Ambulatory referral to Neurology   Complete by: As directed    Refer to [facility]      Allergies as of 07/29/2021       Reactions   Metformin    This allergy is not listed under records with [facility] facility. Past records states that patient "felt poor" as a reaction to taking this medication        Medication List     STOP taking these medications    enoxaparin 30 MG/0.3ML injection Commonly known as: LOVENOX   ferrous  sulfate 325 (65 FE) MG tablet       TAKE these medications    acetaminophen 650 MG CR tablet Commonly known as: TYLENOL Take 650 mg by mouth every 6 (six) hours.   amLODipine  10 MG tablet Commonly known as: NORVASC Take 1 tablet (10 mg total) by mouth daily.   aspirin 81 MG tablet Take 81 mg by mouth daily.   brimonidine 0.2 % ophthalmic solution Commonly known as: ALPHAGAN Place 1 drop into both eyes every 8 (eight) hours.   clonazePAM 0.5 MG tablet Commonly known as: KLONOPIN Take 0.5 mg by mouth daily as needed for anxiety.   cycloSPORINE 0.05 % ophthalmic emulsion Commonly known as: RESTASIS Place 1 drop into both eyes 2 (two) times daily.   dorzolamide-timolol 22.3-6.8 MG/ML ophthalmic solution Commonly known as: COSOPT Place 1 drop into both eyes in the morning and at bedtime.   glimepiride 1 MG tablet Commonly known as: AMARYL Take 1 mg by mouth daily.   hydrALAZINE 10 MG tablet Commonly known as: APRESOLINE Take 10 mg by mouth 3 (three) times daily.   loperamide 2 MG tablet Commonly known as: IMODIUM A-D Take 2 mg by mouth 4 (four) times daily as needed for diarrhea or loose stools.   methocarbamol 500 MG tablet Commonly known as: ROBAXIN Take 1 tablet (500 mg total) by mouth every 6 (six) hours as needed for muscle spasms.   mirtazapine 7.5 MG tablet Commonly known as: REMERON Take 7.5 mg by mouth at bedtime.   NON FORMULARY Diet: _____ Regular, ______ NAS, ___x____Consistent Carbohydrate, _______NPO _____Other   Rocklatan 0.02-0.005 % Soln Generic drug: Netarsudil-Latanoprost Apply 1 drop to eye at bedtime.   sitaGLIPtin 25 MG tablet Commonly known as: Januvia Take 1 tablet (25 mg total) by mouth daily.   vitamin B-12 1000 MCG tablet Commonly known as: CYANOCOBALAMIN Take 1 tablet (1,000 mcg total) by mouth daily.   Vitamin D 50 MCG (2000 UT) Caps Take 1 capsule by mouth daily.        Allergies  Allergen Reactions   Metformin     This allergy is not listed under records with [facility] facility. Past records states that patient "felt poor" as a reaction to taking this medication     Consultations: none   Procedures/Studies: CT Angio Head W or Wo Contrast  Result Date: 07/27/2021 CLINICAL DATA:  Stroke/TIA, assess extracranial arteries EXAM: CT ANGIOGRAPHY HEAD AND NECK TECHNIQUE: Multidetector CT imaging of the head and neck was performed using the standard protocol during bolus administration of intravenous contrast. Multiplanar CT image reconstructions and MIPs were obtained to evaluate the vascular anatomy. Carotid stenosis measurements (when applicable) are obtained utilizing NASCET criteria, using the distal internal carotid diameter as the denominator. CONTRAST:  96mL OMNIPAQUE IOHEXOL 350 MG/ML SOLN COMPARISON:  None. FINDINGS: CTA NECK FINDINGS Aortic arch: Great vessel origins are patent.  Atherosclerosis. Right carotid system: No evidence of dissection, stenosis (50% or greater) or occlusion. Left carotid system: No evidence of dissection, stenosis (50% or greater) or occlusion. Retropharyngeal course. Vertebral arteries: Tortuous bilaterally. No significant (greater than 50%) stenosis or evidence of dissection. Mild narrowing of the left vertebral artery origin due to calcific atherosclerosis. Skeleton: Further evaluated on concurrent CT of the cervical spine. Other neck: Multiple thyroid nodules, measuring up to 1.3 cm on the left. Not clinically significant; no follow-up imaging recommended (ref: J Am Coll Radiol. 2015 Feb;12(2): 143-50). Upper chest: Visualized lung apices are clear. Review of the MIP images confirms  the above findings CTA HEAD FINDINGS Anterior circulation: Bilateral intracranial ICAs, MCAs, and ACAs are patent. Mild for age calcific atherosclerosis of bilateral intracranial ICAs without greater than 50% stenosis. Mild-to-moderate right supraclinoid ICA stenosis (approximately 30-40% stenosis). Posterior circulation: Bilateral intradural vertebral arteries, basilar artery, and posterior cerebral arteries are patent. Dolichoectasia of the basilar  artery, measuring up to 5.2 cm in diameter distally. Venous sinuses: As permitted by contrast timing, patent. Review of the MIP images confirms the above findings IMPRESSION: CTA head: 1. No large vessel occlusion. 2. Multifocal severe stenosis of the right P2 PCA. 3. Approximately 30-40% right supraclinoid ICA stenosis. 4. Dolichoectasia of the basilar artery. CTA neck: No significant (greater than 50%) stenosis. Electronically Signed   By: Margaretha Sheffield M.D.   On: 07/27/2021 07:34   CT Angio Neck W and/or Wo Contrast  Result Date: 07/27/2021 CLINICAL DATA:  Stroke/TIA, assess extracranial arteries EXAM: CT ANGIOGRAPHY HEAD AND NECK TECHNIQUE: Multidetector CT imaging of the head and neck was performed using the standard protocol during bolus administration of intravenous contrast. Multiplanar CT image reconstructions and MIPs were obtained to evaluate the vascular anatomy. Carotid stenosis measurements (when applicable) are obtained utilizing NASCET criteria, using the distal internal carotid diameter as the denominator. CONTRAST:  76mL OMNIPAQUE IOHEXOL 350 MG/ML SOLN COMPARISON:  None. FINDINGS: CTA NECK FINDINGS Aortic arch: Great vessel origins are patent.  Atherosclerosis. Right carotid system: No evidence of dissection, stenosis (50% or greater) or occlusion. Left carotid system: No evidence of dissection, stenosis (50% or greater) or occlusion. Retropharyngeal course. Vertebral arteries: Tortuous bilaterally. No significant (greater than 50%) stenosis or evidence of dissection. Mild narrowing of the left vertebral artery origin due to calcific atherosclerosis. Skeleton: Further evaluated on concurrent CT of the cervical spine. Other neck: Multiple thyroid nodules, measuring up to 1.3 cm on the left. Not clinically significant; no follow-up imaging recommended (ref: J Am Coll Radiol. 2015 Feb;12(2): 143-50). Upper chest: Visualized lung apices are clear. Review of the MIP images confirms the above  findings CTA HEAD FINDINGS Anterior circulation: Bilateral intracranial ICAs, MCAs, and ACAs are patent. Mild for age calcific atherosclerosis of bilateral intracranial ICAs without greater than 50% stenosis. Mild-to-moderate right supraclinoid ICA stenosis (approximately 30-40% stenosis). Posterior circulation: Bilateral intradural vertebral arteries, basilar artery, and posterior cerebral arteries are patent. Dolichoectasia of the basilar artery, measuring up to 5.2 cm in diameter distally. Venous sinuses: As permitted by contrast timing, patent. Review of the MIP images confirms the above findings IMPRESSION: CTA head: 1. No large vessel occlusion. 2. Multifocal severe stenosis of the right P2 PCA. 3. Approximately 30-40% right supraclinoid ICA stenosis. 4. Dolichoectasia of the basilar artery. CTA neck: No significant (greater than 50%) stenosis. Electronically Signed   By: Margaretha Sheffield M.D.   On: 07/27/2021 07:34   MR BRAIN WO CONTRAST  Result Date: 07/27/2021 CLINICAL DATA:  Neuro deficit, acute, stroke suspected EXAM: MRI HEAD WITHOUT CONTRAST TECHNIQUE: Multiplanar, multiecho pulse sequences of the brain and surrounding structures were obtained without intravenous contrast. COMPARISON:  August 2019 FINDINGS: Motion artifact is present. Brain: There is no acute infarction or intracranial hemorrhage. There is no intracranial mass, mass effect, or edema. There is no hydrocephalus or extra-axial fluid collection. Prominence of the ventricles and sulci reflects generalized parenchymal volume loss. Chronic left occipitotemporal infarct. Additional chronic infarcts involving the basal ganglia and cerebellum bilaterally. Other patchy and confluent areas of T2 hyperintensity in the supratentorial and pontine white matter are nonspecific but likely reflect moderate to advanced  chronic microvascular ischemic changes. Vascular: Major vessel flow voids at the skull base are preserved. Skull and upper cervical  spine: Normal marrow signal is preserved. Sinuses/Orbits: Mild mucosal thickening. Bilateral lens replacements. Other: Sella is unremarkable.  Mastoid air cells are clear. IMPRESSION: No acute infarction, hemorrhage, or mass. Multiple chronic infarcts and moderate to advanced chronic microvascular ischemic changes. Electronically Signed   By: Macy Mis M.D.   On: 07/27/2021 13:32   CT C-SPINE NO CHARGE  Result Date: 07/27/2021 CLINICAL DATA:  Fall W19.Merril Abbe (ICD-10-CM) EXAM: CT CERVICAL SPINE WITHOUT CONTRAST TECHNIQUE: Multidetector CT imaging of the cervical spine was performed without intravenous contrast. Multiplanar CT image reconstructions were also generated. COMPARISON:  May 09, 2018. FINDINGS: Alignment: Similar alignment. Similar straightening without substantial sagittal subluxation. Skull base and vertebrae: No evidence of acute fracture. Mild height loss of the T2 vertebral body is favored to relate to degenerative remodeling given severe degenerative disc disease and endplate Schmorl's nodes at this level, partially imaged. Soft tissues and spinal canal: No prevertebral fluid or swelling. No visible canal hematoma. Disc levels: Multilevel degenerative disc disease and facet arthropathy in the cervical spine. Progressive degenerative disease at T1-T2, partially imaged. Upper chest: Visualized lung apices are clear. Other: Multiple thyroid nodules, measuring up to 1.3 cm on the left. Not clinically significant; no follow-up imaging recommended (ref: J Am Coll Radiol. 2015 Feb;12(2): 143-50). IMPRESSION: No evidence of acute fracture or traumatic malalignment in the cervical spine. Electronically Signed   By: Margaretha Sheffield M.D.   On: 07/27/2021 07:43   EEG adult  Result Date: 07/28/2021 Lora Havens, MD     07/28/2021 11:00 AM Patient Name: Nancy Medina MRN: [id number] Epilepsy Attending: Lora Havens Referring Physician/Provider: Dr Irwin Brakeman Date: 07/27/2021 Duration: 24.34  mins Patient history: 87 year old female with altered mental status.  EEG to assess for seizures. Level of alertness: Awake AEDs during EEG study: None Technical aspects: This EEG study was done with scalp electrodes positioned according to the 10-20 International system of electrode placement. Electrical activity was acquired at a sampling rate of 500Hz  and reviewed with a high frequency filter of 70Hz  and a low frequency filter of 1Hz . EEG data were recorded continuously and digitally stored. Description: The posterior dominant rhythm consists of 7.5 Hz activity of moderate voltage (25-35 uV) seen predominantly in posterior head regions, symmetric and reactive to eye opening and eye closing.  Hyperventilation and photic stimulation were not performed.   IMPRESSION: This study is within normal limits. No seizures or epileptiform discharges were seen throughout the recording. Lora Havens   ECHOCARDIOGRAM COMPLETE  Result Date: 07/27/2021    ECHOCARDIOGRAM REPORT   Patient Name:   Nancy Medina Date of Exam: 07/27/2021 Medical Rec #:  [id number]       Height:       62.0 in Accession #:    [id number]      Weight:       170.0 lb Date of Birth:  1925/09/30       BSA:          1.784 m Patient Age:    47 years        BP:           160/74 mmHg Patient Gender: F               HR:           81 bpm. Exam Location:  [facility] Procedure: 2D Echo, Cardiac Doppler  and Color Doppler Indications:    CVA  History:        Patient has prior history of Echocardiogram examinations, most                 recent 03/20/2018. Stroke, Signs/Symptoms:Dementia; Risk                 Factors:Hypertension, Diabetes, Dyslipidemia and Obesity.  Sonographer:    Dustin Flock RDCS Referring Phys: [id number] Murlean Iba  Sonographer Comments: Technically difficult study due to poor echo windows and patient is morbidly obese. IMPRESSIONS  1. Left ventricular ejection fraction, by estimation, is 60 to 65%. The left ventricle has normal  function. The left ventricle has no regional wall motion abnormalities. There is moderate left ventricular hypertrophy. Left ventricular diastolic parameters are indeterminate.  2. Right ventricular systolic function is normal. The right ventricular size is normal.  3. Trivial mitral valve regurgitation. Moderate mitral annular calcification.  4. Poor acoustic windows limit study AV is thickened, calcified Peak and mean gradients through the valve are 15 and 6 mm Hg respectively Consistent with mild AS. COmpared to echo report from 2019, Mean gradient is decreased (11 to 6 mm Hg). . The aortic valve was not well visualized. Aortic valve regurgitation is mild.  5. The inferior vena cava is normal in size with greater than 50% respiratory variability, suggesting right atrial pressure of 3 mmHg. FINDINGS  Left Ventricle: Left ventricular ejection fraction, by estimation, is 60 to 65%. The left ventricle has normal function. The left ventricle has no regional wall motion abnormalities. The left ventricular internal cavity size was normal in size. There is  moderate left ventricular hypertrophy. Left ventricular diastolic parameters are indeterminate. Right Ventricle: The right ventricular size is normal. Right vetricular wall thickness was not assessed. Right ventricular systolic function is normal. Left Atrium: Left atrial size was normal in size. Right Atrium: Right atrial size was normal in size. Pericardium: There is no evidence of pericardial effusion. Mitral Valve: Moderate mitral annular calcification. Trivial mitral valve regurgitation. Tricuspid Valve: The tricuspid valve is grossly normal. Tricuspid valve regurgitation is trivial. Aortic Valve: Poor acoustic windows limit study AV is thickened, calcified Peak and mean gradients through the valve are 15 and 6 mm Hg respectively Consistent with mild AS. COmpared to echo report from 2019, Mean gradient is decreased (11 to 6 mm Hg). The aortic valve was not well  visualized. Aortic valve regurgitation is mild. Aortic regurgitation PHT measures 596 msec. Pulmonic Valve: The pulmonic valve was not well visualized. Pulmonic valve regurgitation is not visualized. Aorta: The aortic root and ascending aorta are structurally normal, with no evidence of dilitation. Venous: The inferior vena cava is normal in size with greater than 50% respiratory variability, suggesting right atrial pressure of 3 mmHg. IAS/Shunts: The interatrial septum was not assessed.  LEFT VENTRICLE PLAX 2D LVIDd:         4.09 cm  Diastology LVIDs:         2.66 cm  LV e' medial:    5.87 cm/s LV PW:         1.29 cm  LV E/e' medial:  8.3 LV IVS:        1.57 cm  LV e' lateral:   4.38 cm/s LVOT diam:     2.00 cm  LV E/e' lateral: 11.2 LV SV:         65 LV SV Index:   36 LVOT Area:     3.14 cm  RIGHT VENTRICLE RV Basal diam:  2.65 cm RV S prime:     8.92 cm/s TAPSE (M-mode): 1.6 cm LEFT ATRIUM             Index       RIGHT ATRIUM           Index LA diam:        3.20 cm 1.79 cm/m  RA Area:     10.90 cm LA Vol (A2C):   26.9 ml 15.08 ml/m RA Volume:   23.60 ml  13.23 ml/m LA Vol (A4C):   35.8 ml 20.07 ml/m LA Biplane Vol: 32.9 ml 18.44 ml/m  AORTIC VALVE LVOT Vmax:   106.00 cm/s LVOT Vmean:  69.100 cm/s LVOT VTI:    0.206 m AI PHT:      596 msec  AORTA Ao Root diam: 2.80 cm MITRAL VALVE MV Area (PHT): 4.74 cm    SHUNTS MV Decel Time: 160 msec    Systemic VTI:  0.21 m MV E velocity: 49.00 cm/s  Systemic Diam: 2.00 cm MV A velocity: 95.60 cm/s MV E/A ratio:  0.51 Dorris Carnes MD Electronically signed by Dorris Carnes MD Signature Date/Time: 07/27/2021/9:15:32 PM    Final    CT HEAD CODE STROKE WO CONTRAST  Result Date: 07/27/2021 CLINICAL DATA:  Code stroke.  May 14, 2021. EXAM: CT HEAD WITHOUT CONTRAST TECHNIQUE: Contiguous axial images were obtained from the base of the skull through the vertex without intravenous contrast. COMPARISON:  May 14, 2021. FINDINGS: Brain: No evidence of acute infarction, hemorrhage,  hydrocephalus, extra-axial collection or mass lesion/mass effect. Similar prior left occipital, bilateral basal ganglia, and bilateral cerebellar infarcts. Similar additional moderate to advanced patchy and confluent white matter hypoattenuation, nonspecific but compatible with chronic microvascular ischemic disease. Vascular: No hyperdense vessel identified. Skull: Acute fracture. Sinuses/Orbits:  Clear sinuses. No acute orbital findings. Other: No mastoid effusions. ASPECTS [facility] Stroke Program Early CT Score) total score (0-10 with 10 being normal): 10. IMPRESSION: 1. No evidence of acute large vascular territory infarct or acute hemorrhage. ASPECTS is 10. 2. Similar prior left occipital, bilateral basal ganglia, and bilateral cerebellar infarcts. 3. Moderate to advanced chronic microvascular ischemic disease. Code stroke imaging results were communicated on 07/27/2021 at 6:35 am to provider Dr. Sedonia Small via telephone, who verbally acknowledged these results. Electronically Signed   By: Margaretha Sheffield M.D.   On: 07/27/2021 06:38        Discharge Exam: Vitals:   07/29/21 0451 07/29/21 0745  BP: (!) 143/81 (!) 143/71  Pulse: 93 86  Resp: 20 16  Temp: 98.2 F (36.8 C) 98.2 F (36.8 C)  SpO2: 94% 95%   Vitals:   07/28/21 2037 07/29/21 0000 07/29/21 0451 07/29/21 0745  BP: (!) 146/62 135/61 (!) 143/81 (!) 143/71  Pulse: 77 71 93 86  Resp: 18 18 20 16   Temp: 98.1 F (36.7 C) 98 F (36.7 C) 98.2 F (36.8 C) 98.2 F (36.8 C)  TempSrc:  Oral  Oral  SpO2: 98% 99% 94% 95%  Weight:      Height:        General: Pt is alert, awake, not in acute distress Cardiovascular: RRR, S1/S2 +, no rubs, no gallops Respiratory: CTA bilaterally, no wheezing, no rhonchi Abdominal: Soft, NT, ND, bowel sounds + Extremities: nonpitting edema, no cyanosis   The results of significant diagnostics from this hospitalization (including imaging, microbiology, ancillary and laboratory) are listed below for  reference.    Significant Diagnostic Studies: CT  Angio Head W or Wo Contrast  Result Date: 07/27/2021 CLINICAL DATA:  Stroke/TIA, assess extracranial arteries EXAM: CT ANGIOGRAPHY HEAD AND NECK TECHNIQUE: Multidetector CT imaging of the head and neck was performed using the standard protocol during bolus administration of intravenous contrast. Multiplanar CT image reconstructions and MIPs were obtained to evaluate the vascular anatomy. Carotid stenosis measurements (when applicable) are obtained utilizing NASCET criteria, using the distal internal carotid diameter as the denominator. CONTRAST:  18mL OMNIPAQUE IOHEXOL 350 MG/ML SOLN COMPARISON:  None. FINDINGS: CTA NECK FINDINGS Aortic arch: Great vessel origins are patent.  Atherosclerosis. Right carotid system: No evidence of dissection, stenosis (50% or greater) or occlusion. Left carotid system: No evidence of dissection, stenosis (50% or greater) or occlusion. Retropharyngeal course. Vertebral arteries: Tortuous bilaterally. No significant (greater than 50%) stenosis or evidence of dissection. Mild narrowing of the left vertebral artery origin due to calcific atherosclerosis. Skeleton: Further evaluated on concurrent CT of the cervical spine. Other neck: Multiple thyroid nodules, measuring up to 1.3 cm on the left. Not clinically significant; no follow-up imaging recommended (ref: J Am Coll Radiol. 2015 Feb;12(2): 143-50). Upper chest: Visualized lung apices are clear. Review of the MIP images confirms the above findings CTA HEAD FINDINGS Anterior circulation: Bilateral intracranial ICAs, MCAs, and ACAs are patent. Mild for age calcific atherosclerosis of bilateral intracranial ICAs without greater than 50% stenosis. Mild-to-moderate right supraclinoid ICA stenosis (approximately 30-40% stenosis). Posterior circulation: Bilateral intradural vertebral arteries, basilar artery, and posterior cerebral arteries are patent. Dolichoectasia of the basilar artery,  measuring up to 5.2 cm in diameter distally. Venous sinuses: As permitted by contrast timing, patent. Review of the MIP images confirms the above findings IMPRESSION: CTA head: 1. No large vessel occlusion. 2. Multifocal severe stenosis of the right P2 PCA. 3. Approximately 30-40% right supraclinoid ICA stenosis. 4. Dolichoectasia of the basilar artery. CTA neck: No significant (greater than 50%) stenosis. Electronically Signed   By: Margaretha Sheffield M.D.   On: 07/27/2021 07:34   CT Angio Neck W and/or Wo Contrast  Result Date: 07/27/2021 CLINICAL DATA:  Stroke/TIA, assess extracranial arteries EXAM: CT ANGIOGRAPHY HEAD AND NECK TECHNIQUE: Multidetector CT imaging of the head and neck was performed using the standard protocol during bolus administration of intravenous contrast. Multiplanar CT image reconstructions and MIPs were obtained to evaluate the vascular anatomy. Carotid stenosis measurements (when applicable) are obtained utilizing NASCET criteria, using the distal internal carotid diameter as the denominator. CONTRAST:  41mL OMNIPAQUE IOHEXOL 350 MG/ML SOLN COMPARISON:  None. FINDINGS: CTA NECK FINDINGS Aortic arch: Great vessel origins are patent.  Atherosclerosis. Right carotid system: No evidence of dissection, stenosis (50% or greater) or occlusion. Left carotid system: No evidence of dissection, stenosis (50% or greater) or occlusion. Retropharyngeal course. Vertebral arteries: Tortuous bilaterally. No significant (greater than 50%) stenosis or evidence of dissection. Mild narrowing of the left vertebral artery origin due to calcific atherosclerosis. Skeleton: Further evaluated on concurrent CT of the cervical spine. Other neck: Multiple thyroid nodules, measuring up to 1.3 cm on the left. Not clinically significant; no follow-up imaging recommended (ref: J Am Coll Radiol. 2015 Feb;12(2): 143-50). Upper chest: Visualized lung apices are clear. Review of the MIP images confirms the above findings  CTA HEAD FINDINGS Anterior circulation: Bilateral intracranial ICAs, MCAs, and ACAs are patent. Mild for age calcific atherosclerosis of bilateral intracranial ICAs without greater than 50% stenosis. Mild-to-moderate right supraclinoid ICA stenosis (approximately 30-40% stenosis). Posterior circulation: Bilateral intradural vertebral arteries, basilar artery, and posterior cerebral arteries are  patent. Dolichoectasia of the basilar artery, measuring up to 5.2 cm in diameter distally. Venous sinuses: As permitted by contrast timing, patent. Review of the MIP images confirms the above findings IMPRESSION: CTA head: 1. No large vessel occlusion. 2. Multifocal severe stenosis of the right P2 PCA. 3. Approximately 30-40% right supraclinoid ICA stenosis. 4. Dolichoectasia of the basilar artery. CTA neck: No significant (greater than 50%) stenosis. Electronically Signed   By: Margaretha Sheffield M.D.   On: 07/27/2021 07:34   MR BRAIN WO CONTRAST  Result Date: 07/27/2021 CLINICAL DATA:  Neuro deficit, acute, stroke suspected EXAM: MRI HEAD WITHOUT CONTRAST TECHNIQUE: Multiplanar, multiecho pulse sequences of the brain and surrounding structures were obtained without intravenous contrast. COMPARISON:  August 2019 FINDINGS: Motion artifact is present. Brain: There is no acute infarction or intracranial hemorrhage. There is no intracranial mass, mass effect, or edema. There is no hydrocephalus or extra-axial fluid collection. Prominence of the ventricles and sulci reflects generalized parenchymal volume loss. Chronic left occipitotemporal infarct. Additional chronic infarcts involving the basal ganglia and cerebellum bilaterally. Other patchy and confluent areas of T2 hyperintensity in the supratentorial and pontine white matter are nonspecific but likely reflect moderate to advanced chronic microvascular ischemic changes. Vascular: Major vessel flow voids at the skull base are preserved. Skull and upper cervical spine:  Normal marrow signal is preserved. Sinuses/Orbits: Mild mucosal thickening. Bilateral lens replacements. Other: Sella is unremarkable.  Mastoid air cells are clear. IMPRESSION: No acute infarction, hemorrhage, or mass. Multiple chronic infarcts and moderate to advanced chronic microvascular ischemic changes. Electronically Signed   By: Macy Mis M.D.   On: 07/27/2021 13:32   CT C-SPINE NO CHARGE  Result Date: 07/27/2021 CLINICAL DATA:  Fall W19.Merril Abbe (ICD-10-CM) EXAM: CT CERVICAL SPINE WITHOUT CONTRAST TECHNIQUE: Multidetector CT imaging of the cervical spine was performed without intravenous contrast. Multiplanar CT image reconstructions were also generated. COMPARISON:  May 09, 2018. FINDINGS: Alignment: Similar alignment. Similar straightening without substantial sagittal subluxation. Skull base and vertebrae: No evidence of acute fracture. Mild height loss of the T2 vertebral body is favored to relate to degenerative remodeling given severe degenerative disc disease and endplate Schmorl's nodes at this level, partially imaged. Soft tissues and spinal canal: No prevertebral fluid or swelling. No visible canal hematoma. Disc levels: Multilevel degenerative disc disease and facet arthropathy in the cervical spine. Progressive degenerative disease at T1-T2, partially imaged. Upper chest: Visualized lung apices are clear. Other: Multiple thyroid nodules, measuring up to 1.3 cm on the left. Not clinically significant; no follow-up imaging recommended (ref: J Am Coll Radiol. 2015 Feb;12(2): 143-50). IMPRESSION: No evidence of acute fracture or traumatic malalignment in the cervical spine. Electronically Signed   By: Margaretha Sheffield M.D.   On: 07/27/2021 07:43   EEG adult  Result Date: 07/28/2021 Lora Havens, MD     07/28/2021 11:00 AM Patient Name: Nancy Medina MRN: [id number] Epilepsy Attending: Lora Havens Referring Physician/Provider: Dr Irwin Brakeman Date: 07/27/2021 Duration: 24.34 mins  Patient history: 87 year old female with altered mental status.  EEG to assess for seizures. Level of alertness: Awake AEDs during EEG study: None Technical aspects: This EEG study was done with scalp electrodes positioned according to the 10-20 International system of electrode placement. Electrical activity was acquired at a sampling rate of 500Hz  and reviewed with a high frequency filter of 70Hz  and a low frequency filter of 1Hz . EEG data were recorded continuously and digitally stored. Description: The posterior dominant rhythm consists of 7.5 Hz activity of  moderate voltage (25-35 uV) seen predominantly in posterior head regions, symmetric and reactive to eye opening and eye closing.  Hyperventilation and photic stimulation were not performed.   IMPRESSION: This study is within normal limits. No seizures or epileptiform discharges were seen throughout the recording. Lora Havens   ECHOCARDIOGRAM COMPLETE  Result Date: 07/27/2021    ECHOCARDIOGRAM REPORT   Patient Name:   Nancy Medina Date of Exam: 07/27/2021 Medical Rec #:  [id number]       Height:       62.0 in Accession #:    [id number]      Weight:       170.0 lb Date of Birth:  03-24-1925       BSA:          1.784 m Patient Age:    57 years        BP:           160/74 mmHg Patient Gender: F               HR:           81 bpm. Exam Location:  [facility] Procedure: 2D Echo, Cardiac Doppler and Color Doppler Indications:    CVA  History:        Patient has prior history of Echocardiogram examinations, most                 recent 03/20/2018. Stroke, Signs/Symptoms:Dementia; Risk                 Factors:Hypertension, Diabetes, Dyslipidemia and Obesity.  Sonographer:    Dustin Flock RDCS Referring Phys: [phone] Murlean Iba  Sonographer Comments: Technically difficult study due to poor echo windows and patient is morbidly obese. IMPRESSIONS  1. Left ventricular ejection fraction, by estimation, is 60 to 65%. The left ventricle has normal function.  The left ventricle has no regional wall motion abnormalities. There is moderate left ventricular hypertrophy. Left ventricular diastolic parameters are indeterminate.  2. Right ventricular systolic function is normal. The right ventricular size is normal.  3. Trivial mitral valve regurgitation. Moderate mitral annular calcification.  4. Poor acoustic windows limit study AV is thickened, calcified Peak and mean gradients through the valve are 15 and 6 mm Hg respectively Consistent with mild AS. COmpared to echo report from 2019, Mean gradient is decreased (11 to 6 mm Hg). . The aortic valve was not well visualized. Aortic valve regurgitation is mild.  5. The inferior vena cava is normal in size with greater than 50% respiratory variability, suggesting right atrial pressure of 3 mmHg. FINDINGS  Left Ventricle: Left ventricular ejection fraction, by estimation, is 60 to 65%. The left ventricle has normal function. The left ventricle has no regional wall motion abnormalities. The left ventricular internal cavity size was normal in size. There is  moderate left ventricular hypertrophy. Left ventricular diastolic parameters are indeterminate. Right Ventricle: The right ventricular size is normal. Right vetricular wall thickness was not assessed. Right ventricular systolic function is normal. Left Atrium: Left atrial size was normal in size. Right Atrium: Right atrial size was normal in size. Pericardium: There is no evidence of pericardial effusion. Mitral Valve: Moderate mitral annular calcification. Trivial mitral valve regurgitation. Tricuspid Valve: The tricuspid valve is grossly normal. Tricuspid valve regurgitation is trivial. Aortic Valve: Poor acoustic windows limit study AV is thickened, calcified Peak and mean gradients through the valve are 15 and 6 mm Hg respectively Consistent with mild AS. COmpared to  echo report from 2019, Mean gradient is decreased (11 to 6 mm Hg). The aortic valve was not well visualized.  Aortic valve regurgitation is mild. Aortic regurgitation PHT measures 596 msec. Pulmonic Valve: The pulmonic valve was not well visualized. Pulmonic valve regurgitation is not visualized. Aorta: The aortic root and ascending aorta are structurally normal, with no evidence of dilitation. Venous: The inferior vena cava is normal in size with greater than 50% respiratory variability, suggesting right atrial pressure of 3 mmHg. IAS/Shunts: The interatrial septum was not assessed.  LEFT VENTRICLE PLAX 2D LVIDd:         4.09 cm  Diastology LVIDs:         2.66 cm  LV e' medial:    5.87 cm/s LV PW:         1.29 cm  LV E/e' medial:  8.3 LV IVS:        1.57 cm  LV e' lateral:   4.38 cm/s LVOT diam:     2.00 cm  LV E/e' lateral: 11.2 LV SV:         65 LV SV Index:   36 LVOT Area:     3.14 cm  RIGHT VENTRICLE RV Basal diam:  2.65 cm RV S prime:     8.92 cm/s TAPSE (M-mode): 1.6 cm LEFT ATRIUM             Index       RIGHT ATRIUM           Index LA diam:        3.20 cm 1.79 cm/m  RA Area:     10.90 cm LA Vol (A2C):   26.9 ml 15.08 ml/m RA Volume:   23.60 ml  13.23 ml/m LA Vol (A4C):   35.8 ml 20.07 ml/m LA Biplane Vol: 32.9 ml 18.44 ml/m  AORTIC VALVE LVOT Vmax:   106.00 cm/s LVOT Vmean:  69.100 cm/s LVOT VTI:    0.206 m AI PHT:      596 msec  AORTA Ao Root diam: 2.80 cm MITRAL VALVE MV Area (PHT): 4.74 cm    SHUNTS MV Decel Time: 160 msec    Systemic VTI:  0.21 m MV E velocity: 49.00 cm/s  Systemic Diam: 2.00 cm MV A velocity: 95.60 cm/s MV E/A ratio:  0.51 Dorris Carnes MD Electronically signed by Dorris Carnes MD Signature Date/Time: 07/27/2021/9:15:32 PM    Final    CT HEAD CODE STROKE WO CONTRAST  Result Date: 07/27/2021 CLINICAL DATA:  Code stroke.  May 14, 2021. EXAM: CT HEAD WITHOUT CONTRAST TECHNIQUE: Contiguous axial images were obtained from the base of the skull through the vertex without intravenous contrast. COMPARISON:  May 14, 2021. FINDINGS: Brain: No evidence of acute infarction, hemorrhage, hydrocephalus,  extra-axial collection or mass lesion/mass effect. Similar prior left occipital, bilateral basal ganglia, and bilateral cerebellar infarcts. Similar additional moderate to advanced patchy and confluent white matter hypoattenuation, nonspecific but compatible with chronic microvascular ischemic disease. Vascular: No hyperdense vessel identified. Skull: Acute fracture. Sinuses/Orbits:  Clear sinuses. No acute orbital findings. Other: No mastoid effusions. ASPECTS [facility] Stroke Program Early CT Score) total score (0-10 with 10 being normal): 10. IMPRESSION: 1. No evidence of acute large vascular territory infarct or acute hemorrhage. ASPECTS is 10. 2. Similar prior left occipital, bilateral basal ganglia, and bilateral cerebellar infarcts. 3. Moderate to advanced chronic microvascular ischemic disease. Code stroke imaging results were communicated on 07/27/2021 at 6:35 am to provider Dr. Sedonia Small via telephone, who verbally  acknowledged these results. Electronically Signed   By: Margaretha Sheffield M.D.   On: 07/27/2021 06:38    Microbiology: Recent Results (from the past 240 hour(s))  Resp Panel by RT-PCR (Flu A&B, Covid) Nasopharyngeal Swab     Status: None   Collection Time: 07/27/21  6:45 AM   Specimen: Nasopharyngeal Swab; Nasopharyngeal(NP) swabs in vial transport medium  Result Value Ref Range Status   SARS Coronavirus 2 by RT PCR NEGATIVE NEGATIVE Final    Comment: (NOTE) SARS-CoV-2 target nucleic acids are NOT DETECTED.  The SARS-CoV-2 RNA is generally detectable in upper respiratory specimens during the acute phase of infection. The lowest concentration of SARS-CoV-2 viral copies this assay can detect is 138 copies/mL. A negative result does not preclude SARS-Cov-2 infection and should not be used as the sole basis for treatment or other patient management decisions. A negative result may occur with  improper specimen collection/handling, submission of specimen other than nasopharyngeal swab,  presence of viral mutation(s) within the areas targeted by this assay, and inadequate number of viral copies(<138 copies/mL). A negative result must be combined with clinical observations, patient history, and epidemiological information. The expected result is Negative.  Fact Sheet for Patients:  EntrepreneurPulse.com.au  Fact Sheet for Healthcare Providers:  IncredibleEmployment.be  This test is no t yet approved or cleared by the [place] FDA and  has been authorized for detection and/or diagnosis of SARS-CoV-2 by FDA under an Emergency Use Authorization (EUA). This EUA will remain  in effect (meaning this test can be used) for the duration of the COVID-19 declaration under Section 564(b)(1) of the Act, 21 U.S.C.section 360bbb-3(b)(1), unless the authorization is terminated  or revoked sooner.       Influenza A by PCR NEGATIVE NEGATIVE Final   Influenza B by PCR NEGATIVE NEGATIVE Final    Comment: (NOTE) The Xpert Xpress SARS-CoV-2/FLU/RSV plus assay is intended as an aid in the diagnosis of influenza from Nasopharyngeal swab specimens and should not be used as a sole basis for treatment. Nasal washings and aspirates are unacceptable for Xpert Xpress SARS-CoV-2/FLU/RSV testing.  Fact Sheet for Patients: EntrepreneurPulse.com.au  Fact Sheet for Healthcare Providers: IncredibleEmployment.be  This test is not yet approved or cleared by the [place] FDA and has been authorized for detection and/or diagnosis of SARS-CoV-2 by FDA under an Emergency Use Authorization (EUA). This EUA will remain in effect (meaning this test can be used) for the duration of the COVID-19 declaration under Section 564(b)(1) of the Act, 21 U.S.C. section 360bbb-3(b)(1), unless the authorization is terminated or revoked.  Performed at [facility], [address], [place], [place] [address]      Labs: Basic  Metabolic Panel: Recent Labs  Lab 07/27/21 0656 07/28/21 0559 07/29/21 0619  NA 138 142 142  K 3.7 3.7 3.6  CL 108 113* 112*  CO2 22 22 22   GLUCOSE 117* 100* 128*  BUN 41* 35* 36*  CREATININE 1.78* 1.65* 1.80*  CALCIUM 8.4* 8.3* 8.3*  MG  --   --  2.0   Liver Function Tests: Recent Labs  Lab 07/27/21 0656  AST 13*  ALT 10  ALKPHOS 51  BILITOT 0.7  PROT 6.5  ALBUMIN 3.8   No results for input(s): LIPASE, AMYLASE in the last 168 hours. Recent Labs  Lab 07/28/21 0620  AMMONIA 15   CBC: Recent Labs  Lab 07/27/21 0656 07/28/21 0620  WBC 10.6* 5.7  NEUTROABS 7.3  --   HGB 11.7* 11.3*  HCT 36.2 35.9*  MCV 95.3 95.7  PLT 213 208   Cardiac Enzymes: No results for input(s): CKTOTAL, CKMB, CKMBINDEX, TROPONINI in the last 168 hours. BNP: Invalid input(s): POCBNP CBG: No results for input(s): GLUCAP in the last 168 hours.  Time coordinating discharge:  36 minutes  Signed:  Orson Eva, DO [facility] Pager: [phone] 07/29/2021, 9:23 AM

## 2021-07-29 NOTE — Progress Notes (Signed)
Patient refused her morning medications states " I am not taking that crap " . Refused x 2. Educated patient that she needs her BP medication , she refused oral medications and eye drops . Patient also asked if she needed anything she said " coffee" . Patient then proceeded to say that someone had put " Crap " in her coffee cup . Will get patient a new cup of coffee.

## 2021-07-29 NOTE — NC FL2 (Signed)
[place] LEVEL OF CARE SCREENING TOOL     IDENTIFICATION  Patient Name: Nancy Medina Birthdate: 1925-07-25 Sex: female Admission Date (Current Location): 07/27/2021  [facility] and [place] Number:  [employer] and Address:  [facility] [address] [place]      Provider Number: [id number]  Attending Physician Name and Address:  Orson Eva, MD  Relative Name and Phone Number:       Current Level of Care: Hospital Recommended Level of Care: [place] Prior Approval Number:    Date Approved/Denied:   PASRR Number:    Discharge Plan: Other (Comment)    Current Diagnoses: Patient Active Problem List   Diagnosis Date Noted   Altered mental status 45/40/9811   Acute metabolic encephalopathy 91/47/8295   Altered mental state 07/27/2021   Hypertension associated with stage 4 chronic kidney disease due to type 2 diabetes mellitus ([place]) 05/01/2020   Hyperlipidemia associated with type 2 diabetes mellitus ([place]) 05/01/2020   CKD stage 4 due to type 2 diabetes mellitus ([place]) 05/01/2020   Acute blood loss anemia 05/01/2020   Bilateral lower extremity edema 05/01/2020   Increased intraocular pressure, bilateral 05/01/2020   Chronic constipation 05/01/2020   Displaced intertrochanteric fracture of left femur, subsequent encounter for closed fracture with routine healing 04/27/2020   Gait abnormality 09/13/2018   Mild cognitive impairment 09/13/2018   Uncontrolled hypertension 07/06/2018   Fall 07/04/2018   Scalp laceration 07/04/2018   Acute bronchitis due to Rhinovirus 03/20/2018   Diabetic hyperosmolar non-ketotic state ([place]) 03/18/2018   Acute respiratory failure with hypoxia ([place]) 03/18/2018   CKD (chronic kidney disease) stage 4, GFR 15-29 ml/min ([facility]) 03/18/2018   Acute lower UTI 02/09/2018   Nausea 02/09/2018   Constipation 02/09/2018   Diarrhea 11/25/2011   Lymphocytic colitis 06/15/2011   Esophageal  dysphagia 06/15/2011   UNSPECIFIED ANEMIA 04/27/2009   ACHILLES TENDINITIS 10/27/2008   PERIPHERAL EDEMA 10/14/2008   DEGENERATIVE [place] DISEASE 01/15/2008   Type 2 diabetes mellitus with hyperlipidemia ([place]) 12/13/2007   Hyperlipidemia 10/11/2006   OBESITY NOS 10/11/2006   Anxiety 10/11/2006   Depression 10/11/2006   Unspecified glaucoma 10/11/2006   BENIGN POSITIONAL VERTIGO 10/11/2006   Essential hypertension 10/11/2006   GERD 10/11/2006   OVERACTIVE BLADDER 10/11/2006   OSTEOPOROSIS 10/11/2006   BREAST CANCER, HX OF 10/11/2006   CEREBROVASCULAR ACCIDENT, HX OF 10/11/2006    Orientation RESPIRATION BLADDER Height & Weight     Self, Place  Normal Continent Weight: 160 lb 0.9 oz (72.6 kg) Height:  5\' 6"  (167.6 cm)  BEHAVIORAL SYMPTOMS/MOOD NEUROLOGICAL BOWEL NUTRITION STATUS      Continent Diet (DIET DYS 3 Room service appropriate? Yes; Fluid consistency: Thin)  AMBULATORY STATUS COMMUNICATION OF NEEDS Skin   Independent Verbally Normal                       Personal Care Assistance Level of Assistance  Bathing, Feeding, Dressing Bathing Assistance: Limited assistance Feeding assistance: Independent Dressing Assistance: Limited assistance     Functional Limitations Info  Sight, Hearing, Speech Sight Info: Adequate Hearing Info: Adequate Speech Info: Adequate    SPECIAL CARE FACTORS FREQUENCY                       Contractures Contractures Info: Not present    Additional Factors Info  Code Status, Allergies Code Status Info: DNR Allergies Info: Metformin  Current Medications (07/29/2021):  This is the current hospital active medication list Current Facility-Administered Medications  Medication Dose Route Frequency Provider Last Rate Last Admin    stroke: mapping our early stages of recovery book   Does not apply Once Johnson, Clanford L, MD       0.9 % NaCl with KCl 20 mEq/ L  infusion   Intravenous Continuous Tat, David, MD 75 mL/hr at  07/28/21 2356 New Bag at 07/28/21 2356   acetaminophen (TYLENOL) tablet 650 mg  650 mg Oral Q4H PRN Johnson, Clanford L, MD       Or   acetaminophen (TYLENOL) 160 MG/5ML solution 650 mg  650 mg Per Tube Q4H PRN Johnson, Clanford L, MD       Or   acetaminophen (TYLENOL) suppository 650 mg  650 mg Rectal Q4H PRN Johnson, Clanford L, MD       amLODipine (NORVASC) tablet 10 mg  10 mg Oral Daily Johnson, Clanford L, MD   10 mg at 07/28/21 1287   aspirin chewable tablet 81 mg  81 mg Oral Daily Johnson, Clanford L, MD   81 mg at 07/28/21 0922   brimonidine (ALPHAGAN) 0.2 % ophthalmic solution 1 drop  1 drop Both Eyes Q8H Johnson, Clanford L, MD   1 drop at 07/29/21 0546   cholecalciferol (VITAMIN D3) tablet 2,000 Units  2,000 Units Oral Daily Johnson, Clanford L, MD   2,000 Units at 07/28/21 8676   clonazePAM (KLONOPIN) tablet 0.5 mg  0.5 mg Oral QHS PRN Johnson, Clanford L, MD       cycloSPORINE (RESTASIS) 0.05 % ophthalmic emulsion 1 drop  1 drop Both Eyes BID Johnson, Clanford L, MD   1 drop at 07/28/21 2349   dorzolamide-timolol (COSOPT) 22.3-6.8 MG/ML ophthalmic solution 1 drop  1 drop Both Eyes BID Johnson, Clanford L, MD   1 drop at 07/28/21 2349   enoxaparin (LOVENOX) injection 30 mg  30 mg Subcutaneous Q24H Johnson, Clanford L, MD   30 mg at 07/28/21 1514   hydrALAZINE (APRESOLINE) tablet 10 mg  10 mg Oral TID Wynetta Emery, Clanford L, MD   10 mg at 07/28/21 2350   methocarbamol (ROBAXIN) tablet 500 mg  500 mg Oral Q6H PRN Johnson, Clanford L, MD       mirtazapine (REMERON) tablet 7.5 mg  7.5 mg Oral QHS Johnson, Clanford L, MD   7.5 mg at 07/28/21 2350   Netarsudil-Latanoprost 0.02-0.005 % SOLN 1 drop  1 drop Ophthalmic QHS Johnson, Clanford L, MD       senna-docusate (Senokot-S) tablet 1 tablet  1 tablet Oral QHS PRN Johnson, Clanford L, MD       vitamin B-12 (CYANOCOBALAMIN) tablet 1,000 mcg  1,000 mcg Oral Daily Wynetta Emery, Clanford L, MD   1,000 mcg at 07/28/21 7209     Discharge  Medications: Please see discharge summary for a list of discharge medications.  Relevant Imaging Results:  Relevant Lab Results:   Additional Information [facility] PT/OT  Natasha Bence, LCSW

## 2021-07-31 DIAGNOSIS — F4323 Adjustment disorder with mixed anxiety and depressed mood: Secondary | ICD-10-CM | POA: Diagnosis not present

## 2021-08-04 DIAGNOSIS — I10 Essential (primary) hypertension: Secondary | ICD-10-CM | POA: Diagnosis not present

## 2021-08-04 DIAGNOSIS — G9340 Encephalopathy, unspecified: Secondary | ICD-10-CM | POA: Diagnosis not present

## 2021-08-04 DIAGNOSIS — M6281 Muscle weakness (generalized): Secondary | ICD-10-CM | POA: Diagnosis not present

## 2021-08-04 DIAGNOSIS — W19XXXD Unspecified fall, subsequent encounter: Secondary | ICD-10-CM | POA: Diagnosis not present

## 2021-08-09 DIAGNOSIS — F0150 Vascular dementia without behavioral disturbance: Secondary | ICD-10-CM | POA: Diagnosis not present

## 2021-08-09 DIAGNOSIS — G4701 Insomnia due to medical condition: Secondary | ICD-10-CM | POA: Diagnosis not present

## 2021-08-09 DIAGNOSIS — F331 Major depressive disorder, recurrent, moderate: Secondary | ICD-10-CM | POA: Diagnosis not present

## 2021-08-10 DIAGNOSIS — M545 Low back pain, unspecified: Secondary | ICD-10-CM | POA: Diagnosis not present

## 2021-08-10 DIAGNOSIS — F419 Anxiety disorder, unspecified: Secondary | ICD-10-CM | POA: Diagnosis not present

## 2021-08-10 DIAGNOSIS — E860 Dehydration: Secondary | ICD-10-CM | POA: Diagnosis not present

## 2021-08-10 DIAGNOSIS — M810 Age-related osteoporosis without current pathological fracture: Secondary | ICD-10-CM | POA: Diagnosis not present

## 2021-08-10 DIAGNOSIS — F32A Depression, unspecified: Secondary | ICD-10-CM | POA: Diagnosis not present

## 2021-08-10 DIAGNOSIS — G9341 Metabolic encephalopathy: Secondary | ICD-10-CM | POA: Diagnosis not present

## 2021-08-10 DIAGNOSIS — H409 Unspecified glaucoma: Secondary | ICD-10-CM | POA: Diagnosis not present

## 2021-08-10 DIAGNOSIS — S61501D Unspecified open wound of right wrist, subsequent encounter: Secondary | ICD-10-CM | POA: Diagnosis not present

## 2021-08-10 DIAGNOSIS — E1169 Type 2 diabetes mellitus with other specified complication: Secondary | ICD-10-CM | POA: Diagnosis not present

## 2021-08-10 DIAGNOSIS — H547 Unspecified visual loss: Secondary | ICD-10-CM | POA: Diagnosis not present

## 2021-08-10 DIAGNOSIS — G3184 Mild cognitive impairment, so stated: Secondary | ICD-10-CM | POA: Diagnosis not present

## 2021-08-10 DIAGNOSIS — E785 Hyperlipidemia, unspecified: Secondary | ICD-10-CM | POA: Diagnosis not present

## 2021-08-10 DIAGNOSIS — E669 Obesity, unspecified: Secondary | ICD-10-CM | POA: Diagnosis not present

## 2021-08-10 DIAGNOSIS — D631 Anemia in chronic kidney disease: Secondary | ICD-10-CM | POA: Diagnosis not present

## 2021-08-10 DIAGNOSIS — I69398 Other sequelae of cerebral infarction: Secondary | ICD-10-CM | POA: Diagnosis not present

## 2021-08-10 DIAGNOSIS — M5134 Other intervertebral disc degeneration, thoracic region: Secondary | ICD-10-CM | POA: Diagnosis not present

## 2021-08-10 DIAGNOSIS — N184 Chronic kidney disease, stage 4 (severe): Secondary | ICD-10-CM | POA: Diagnosis not present

## 2021-08-10 DIAGNOSIS — E042 Nontoxic multinodular goiter: Secondary | ICD-10-CM | POA: Diagnosis not present

## 2021-08-10 DIAGNOSIS — K5909 Other constipation: Secondary | ICD-10-CM | POA: Diagnosis not present

## 2021-08-10 DIAGNOSIS — H8110 Benign paroxysmal vertigo, unspecified ear: Secondary | ICD-10-CM | POA: Diagnosis not present

## 2021-08-10 DIAGNOSIS — I083 Combined rheumatic disorders of mitral, aortic and tricuspid valves: Secondary | ICD-10-CM | POA: Diagnosis not present

## 2021-08-10 DIAGNOSIS — I129 Hypertensive chronic kidney disease with stage 1 through stage 4 chronic kidney disease, or unspecified chronic kidney disease: Secondary | ICD-10-CM | POA: Diagnosis not present

## 2021-08-10 DIAGNOSIS — K219 Gastro-esophageal reflux disease without esophagitis: Secondary | ICD-10-CM | POA: Diagnosis not present

## 2021-08-10 DIAGNOSIS — G8929 Other chronic pain: Secondary | ICD-10-CM | POA: Diagnosis not present

## 2021-08-10 DIAGNOSIS — E1122 Type 2 diabetes mellitus with diabetic chronic kidney disease: Secondary | ICD-10-CM | POA: Diagnosis not present

## 2021-08-11 DIAGNOSIS — E559 Vitamin D deficiency, unspecified: Secondary | ICD-10-CM | POA: Diagnosis not present

## 2021-08-11 DIAGNOSIS — I69398 Other sequelae of cerebral infarction: Secondary | ICD-10-CM | POA: Diagnosis not present

## 2021-08-11 DIAGNOSIS — F331 Major depressive disorder, recurrent, moderate: Secondary | ICD-10-CM | POA: Diagnosis not present

## 2021-08-11 DIAGNOSIS — D509 Iron deficiency anemia, unspecified: Secondary | ICD-10-CM | POA: Diagnosis not present

## 2021-08-11 DIAGNOSIS — N184 Chronic kidney disease, stage 4 (severe): Secondary | ICD-10-CM | POA: Diagnosis not present

## 2021-08-11 DIAGNOSIS — I129 Hypertensive chronic kidney disease with stage 1 through stage 4 chronic kidney disease, or unspecified chronic kidney disease: Secondary | ICD-10-CM | POA: Diagnosis not present

## 2021-08-11 DIAGNOSIS — M810 Age-related osteoporosis without current pathological fracture: Secondary | ICD-10-CM | POA: Diagnosis not present

## 2021-08-11 DIAGNOSIS — E1122 Type 2 diabetes mellitus with diabetic chronic kidney disease: Secondary | ICD-10-CM | POA: Diagnosis not present

## 2021-08-11 DIAGNOSIS — M170 Bilateral primary osteoarthritis of knee: Secondary | ICD-10-CM | POA: Diagnosis not present

## 2021-08-11 DIAGNOSIS — R269 Unspecified abnormalities of gait and mobility: Secondary | ICD-10-CM | POA: Diagnosis not present

## 2021-08-11 DIAGNOSIS — F0150 Vascular dementia without behavioral disturbance: Secondary | ICD-10-CM | POA: Diagnosis not present

## 2021-08-11 DIAGNOSIS — K219 Gastro-esophageal reflux disease without esophagitis: Secondary | ICD-10-CM | POA: Diagnosis not present

## 2021-08-16 DIAGNOSIS — F331 Major depressive disorder, recurrent, moderate: Secondary | ICD-10-CM | POA: Diagnosis not present

## 2021-08-20 DIAGNOSIS — N184 Chronic kidney disease, stage 4 (severe): Secondary | ICD-10-CM | POA: Diagnosis not present

## 2021-08-23 DIAGNOSIS — F419 Anxiety disorder, unspecified: Secondary | ICD-10-CM | POA: Diagnosis not present

## 2021-08-23 DIAGNOSIS — K219 Gastro-esophageal reflux disease without esophagitis: Secondary | ICD-10-CM | POA: Diagnosis not present

## 2021-08-23 DIAGNOSIS — S61501D Unspecified open wound of right wrist, subsequent encounter: Secondary | ICD-10-CM | POA: Diagnosis not present

## 2021-08-23 DIAGNOSIS — H547 Unspecified visual loss: Secondary | ICD-10-CM | POA: Diagnosis not present

## 2021-08-23 DIAGNOSIS — G3184 Mild cognitive impairment, so stated: Secondary | ICD-10-CM | POA: Diagnosis not present

## 2021-08-23 DIAGNOSIS — K5909 Other constipation: Secondary | ICD-10-CM | POA: Diagnosis not present

## 2021-08-23 DIAGNOSIS — M5134 Other intervertebral disc degeneration, thoracic region: Secondary | ICD-10-CM | POA: Diagnosis not present

## 2021-08-23 DIAGNOSIS — G8929 Other chronic pain: Secondary | ICD-10-CM | POA: Diagnosis not present

## 2021-08-23 DIAGNOSIS — E860 Dehydration: Secondary | ICD-10-CM | POA: Diagnosis not present

## 2021-08-23 DIAGNOSIS — H409 Unspecified glaucoma: Secondary | ICD-10-CM | POA: Diagnosis not present

## 2021-08-23 DIAGNOSIS — E1122 Type 2 diabetes mellitus with diabetic chronic kidney disease: Secondary | ICD-10-CM | POA: Diagnosis not present

## 2021-08-23 DIAGNOSIS — G9341 Metabolic encephalopathy: Secondary | ICD-10-CM | POA: Diagnosis not present

## 2021-08-23 DIAGNOSIS — E785 Hyperlipidemia, unspecified: Secondary | ICD-10-CM | POA: Diagnosis not present

## 2021-08-23 DIAGNOSIS — H8110 Benign paroxysmal vertigo, unspecified ear: Secondary | ICD-10-CM | POA: Diagnosis not present

## 2021-08-23 DIAGNOSIS — N184 Chronic kidney disease, stage 4 (severe): Secondary | ICD-10-CM | POA: Diagnosis not present

## 2021-08-23 DIAGNOSIS — I083 Combined rheumatic disorders of mitral, aortic and tricuspid valves: Secondary | ICD-10-CM | POA: Diagnosis not present

## 2021-08-23 DIAGNOSIS — I129 Hypertensive chronic kidney disease with stage 1 through stage 4 chronic kidney disease, or unspecified chronic kidney disease: Secondary | ICD-10-CM | POA: Diagnosis not present

## 2021-08-23 DIAGNOSIS — F32A Depression, unspecified: Secondary | ICD-10-CM | POA: Diagnosis not present

## 2021-08-23 DIAGNOSIS — M545 Low back pain, unspecified: Secondary | ICD-10-CM | POA: Diagnosis not present

## 2021-08-23 DIAGNOSIS — E042 Nontoxic multinodular goiter: Secondary | ICD-10-CM | POA: Diagnosis not present

## 2021-08-23 DIAGNOSIS — E669 Obesity, unspecified: Secondary | ICD-10-CM | POA: Diagnosis not present

## 2021-08-23 DIAGNOSIS — D631 Anemia in chronic kidney disease: Secondary | ICD-10-CM | POA: Diagnosis not present

## 2021-08-23 DIAGNOSIS — E1169 Type 2 diabetes mellitus with other specified complication: Secondary | ICD-10-CM | POA: Diagnosis not present

## 2021-08-23 DIAGNOSIS — I69398 Other sequelae of cerebral infarction: Secondary | ICD-10-CM | POA: Diagnosis not present

## 2021-08-23 DIAGNOSIS — M810 Age-related osteoporosis without current pathological fracture: Secondary | ICD-10-CM | POA: Diagnosis not present

## 2021-08-26 DIAGNOSIS — N390 Urinary tract infection, site not specified: Secondary | ICD-10-CM | POA: Diagnosis not present

## 2021-09-03 DIAGNOSIS — F331 Major depressive disorder, recurrent, moderate: Secondary | ICD-10-CM | POA: Diagnosis not present

## 2021-09-07 DIAGNOSIS — F419 Anxiety disorder, unspecified: Secondary | ICD-10-CM | POA: Diagnosis not present

## 2021-09-07 DIAGNOSIS — M5134 Other intervertebral disc degeneration, thoracic region: Secondary | ICD-10-CM | POA: Diagnosis not present

## 2021-09-07 DIAGNOSIS — E042 Nontoxic multinodular goiter: Secondary | ICD-10-CM | POA: Diagnosis not present

## 2021-09-07 DIAGNOSIS — I69398 Other sequelae of cerebral infarction: Secondary | ICD-10-CM | POA: Diagnosis not present

## 2021-09-07 DIAGNOSIS — H409 Unspecified glaucoma: Secondary | ICD-10-CM | POA: Diagnosis not present

## 2021-09-07 DIAGNOSIS — E1169 Type 2 diabetes mellitus with other specified complication: Secondary | ICD-10-CM | POA: Diagnosis not present

## 2021-09-07 DIAGNOSIS — H8110 Benign paroxysmal vertigo, unspecified ear: Secondary | ICD-10-CM | POA: Diagnosis not present

## 2021-09-07 DIAGNOSIS — G3184 Mild cognitive impairment, so stated: Secondary | ICD-10-CM | POA: Diagnosis not present

## 2021-09-07 DIAGNOSIS — K219 Gastro-esophageal reflux disease without esophagitis: Secondary | ICD-10-CM | POA: Diagnosis not present

## 2021-09-07 DIAGNOSIS — E669 Obesity, unspecified: Secondary | ICD-10-CM | POA: Diagnosis not present

## 2021-09-07 DIAGNOSIS — H547 Unspecified visual loss: Secondary | ICD-10-CM | POA: Diagnosis not present

## 2021-09-07 DIAGNOSIS — N184 Chronic kidney disease, stage 4 (severe): Secondary | ICD-10-CM | POA: Diagnosis not present

## 2021-09-07 DIAGNOSIS — G8929 Other chronic pain: Secondary | ICD-10-CM | POA: Diagnosis not present

## 2021-09-07 DIAGNOSIS — I083 Combined rheumatic disorders of mitral, aortic and tricuspid valves: Secondary | ICD-10-CM | POA: Diagnosis not present

## 2021-09-07 DIAGNOSIS — S61501D Unspecified open wound of right wrist, subsequent encounter: Secondary | ICD-10-CM | POA: Diagnosis not present

## 2021-09-07 DIAGNOSIS — M545 Low back pain, unspecified: Secondary | ICD-10-CM | POA: Diagnosis not present

## 2021-09-07 DIAGNOSIS — I129 Hypertensive chronic kidney disease with stage 1 through stage 4 chronic kidney disease, or unspecified chronic kidney disease: Secondary | ICD-10-CM | POA: Diagnosis not present

## 2021-09-07 DIAGNOSIS — G9341 Metabolic encephalopathy: Secondary | ICD-10-CM | POA: Diagnosis not present

## 2021-09-07 DIAGNOSIS — E1122 Type 2 diabetes mellitus with diabetic chronic kidney disease: Secondary | ICD-10-CM | POA: Diagnosis not present

## 2021-09-07 DIAGNOSIS — F32A Depression, unspecified: Secondary | ICD-10-CM | POA: Diagnosis not present

## 2021-09-07 DIAGNOSIS — E785 Hyperlipidemia, unspecified: Secondary | ICD-10-CM | POA: Diagnosis not present

## 2021-09-07 DIAGNOSIS — D631 Anemia in chronic kidney disease: Secondary | ICD-10-CM | POA: Diagnosis not present

## 2021-09-07 DIAGNOSIS — K5909 Other constipation: Secondary | ICD-10-CM | POA: Diagnosis not present

## 2021-09-07 DIAGNOSIS — E860 Dehydration: Secondary | ICD-10-CM | POA: Diagnosis not present

## 2021-09-07 DIAGNOSIS — M810 Age-related osteoporosis without current pathological fracture: Secondary | ICD-10-CM | POA: Diagnosis not present

## 2021-09-08 DIAGNOSIS — K219 Gastro-esophageal reflux disease without esophagitis: Secondary | ICD-10-CM | POA: Diagnosis not present

## 2021-09-08 DIAGNOSIS — R269 Unspecified abnormalities of gait and mobility: Secondary | ICD-10-CM | POA: Diagnosis not present

## 2021-09-08 DIAGNOSIS — M170 Bilateral primary osteoarthritis of knee: Secondary | ICD-10-CM | POA: Diagnosis not present

## 2021-09-08 DIAGNOSIS — N390 Urinary tract infection, site not specified: Secondary | ICD-10-CM | POA: Diagnosis not present

## 2021-09-08 DIAGNOSIS — F4323 Adjustment disorder with mixed anxiety and depressed mood: Secondary | ICD-10-CM | POA: Diagnosis not present

## 2021-09-08 DIAGNOSIS — F0150 Vascular dementia without behavioral disturbance: Secondary | ICD-10-CM | POA: Diagnosis not present

## 2021-09-08 DIAGNOSIS — I10 Essential (primary) hypertension: Secondary | ICD-10-CM | POA: Diagnosis not present

## 2021-09-08 DIAGNOSIS — I129 Hypertensive chronic kidney disease with stage 1 through stage 4 chronic kidney disease, or unspecified chronic kidney disease: Secondary | ICD-10-CM | POA: Diagnosis not present

## 2021-09-08 DIAGNOSIS — N184 Chronic kidney disease, stage 4 (severe): Secondary | ICD-10-CM | POA: Diagnosis not present

## 2021-09-08 DIAGNOSIS — E1122 Type 2 diabetes mellitus with diabetic chronic kidney disease: Secondary | ICD-10-CM | POA: Diagnosis not present

## 2021-09-08 DIAGNOSIS — R451 Restlessness and agitation: Secondary | ICD-10-CM | POA: Diagnosis not present

## 2021-09-09 DIAGNOSIS — G4701 Insomnia due to medical condition: Secondary | ICD-10-CM | POA: Diagnosis not present

## 2021-09-09 DIAGNOSIS — F0150 Vascular dementia without behavioral disturbance: Secondary | ICD-10-CM | POA: Diagnosis not present

## 2021-09-09 DIAGNOSIS — F331 Major depressive disorder, recurrent, moderate: Secondary | ICD-10-CM | POA: Diagnosis not present

## 2021-09-13 DIAGNOSIS — H53431 Sector or arcuate defects, right eye: Secondary | ICD-10-CM | POA: Diagnosis not present

## 2021-09-13 DIAGNOSIS — H401112 Primary open-angle glaucoma, right eye, moderate stage: Secondary | ICD-10-CM | POA: Diagnosis not present

## 2021-09-13 DIAGNOSIS — H401122 Primary open-angle glaucoma, left eye, moderate stage: Secondary | ICD-10-CM | POA: Diagnosis not present

## 2021-09-13 DIAGNOSIS — H353130 Nonexudative age-related macular degeneration, bilateral, stage unspecified: Secondary | ICD-10-CM | POA: Diagnosis not present

## 2021-09-20 DIAGNOSIS — F331 Major depressive disorder, recurrent, moderate: Secondary | ICD-10-CM | POA: Diagnosis not present

## 2021-09-21 DIAGNOSIS — M810 Age-related osteoporosis without current pathological fracture: Secondary | ICD-10-CM | POA: Diagnosis not present

## 2021-09-21 DIAGNOSIS — I129 Hypertensive chronic kidney disease with stage 1 through stage 4 chronic kidney disease, or unspecified chronic kidney disease: Secondary | ICD-10-CM | POA: Diagnosis not present

## 2021-09-21 DIAGNOSIS — M5134 Other intervertebral disc degeneration, thoracic region: Secondary | ICD-10-CM | POA: Diagnosis not present

## 2021-09-21 DIAGNOSIS — G3184 Mild cognitive impairment, so stated: Secondary | ICD-10-CM | POA: Diagnosis not present

## 2021-09-21 DIAGNOSIS — H409 Unspecified glaucoma: Secondary | ICD-10-CM | POA: Diagnosis not present

## 2021-09-21 DIAGNOSIS — S61501D Unspecified open wound of right wrist, subsequent encounter: Secondary | ICD-10-CM | POA: Diagnosis not present

## 2021-09-21 DIAGNOSIS — G9341 Metabolic encephalopathy: Secondary | ICD-10-CM | POA: Diagnosis not present

## 2021-09-21 DIAGNOSIS — H547 Unspecified visual loss: Secondary | ICD-10-CM | POA: Diagnosis not present

## 2021-09-21 DIAGNOSIS — G8929 Other chronic pain: Secondary | ICD-10-CM | POA: Diagnosis not present

## 2021-09-21 DIAGNOSIS — E785 Hyperlipidemia, unspecified: Secondary | ICD-10-CM | POA: Diagnosis not present

## 2021-09-21 DIAGNOSIS — K219 Gastro-esophageal reflux disease without esophagitis: Secondary | ICD-10-CM | POA: Diagnosis not present

## 2021-09-21 DIAGNOSIS — E860 Dehydration: Secondary | ICD-10-CM | POA: Diagnosis not present

## 2021-09-21 DIAGNOSIS — F419 Anxiety disorder, unspecified: Secondary | ICD-10-CM | POA: Diagnosis not present

## 2021-09-21 DIAGNOSIS — F32A Depression, unspecified: Secondary | ICD-10-CM | POA: Diagnosis not present

## 2021-09-21 DIAGNOSIS — I083 Combined rheumatic disorders of mitral, aortic and tricuspid valves: Secondary | ICD-10-CM | POA: Diagnosis not present

## 2021-09-21 DIAGNOSIS — E1122 Type 2 diabetes mellitus with diabetic chronic kidney disease: Secondary | ICD-10-CM | POA: Diagnosis not present

## 2021-09-21 DIAGNOSIS — E042 Nontoxic multinodular goiter: Secondary | ICD-10-CM | POA: Diagnosis not present

## 2021-09-21 DIAGNOSIS — N184 Chronic kidney disease, stage 4 (severe): Secondary | ICD-10-CM | POA: Diagnosis not present

## 2021-09-21 DIAGNOSIS — M545 Low back pain, unspecified: Secondary | ICD-10-CM | POA: Diagnosis not present

## 2021-09-21 DIAGNOSIS — H8110 Benign paroxysmal vertigo, unspecified ear: Secondary | ICD-10-CM | POA: Diagnosis not present

## 2021-09-21 DIAGNOSIS — D631 Anemia in chronic kidney disease: Secondary | ICD-10-CM | POA: Diagnosis not present

## 2021-09-21 DIAGNOSIS — E669 Obesity, unspecified: Secondary | ICD-10-CM | POA: Diagnosis not present

## 2021-09-21 DIAGNOSIS — I69398 Other sequelae of cerebral infarction: Secondary | ICD-10-CM | POA: Diagnosis not present

## 2021-09-21 DIAGNOSIS — K5909 Other constipation: Secondary | ICD-10-CM | POA: Diagnosis not present

## 2021-09-21 DIAGNOSIS — E1169 Type 2 diabetes mellitus with other specified complication: Secondary | ICD-10-CM | POA: Diagnosis not present

## 2021-09-28 DIAGNOSIS — M79675 Pain in left toe(s): Secondary | ICD-10-CM | POA: Diagnosis not present

## 2021-09-28 DIAGNOSIS — B351 Tinea unguium: Secondary | ICD-10-CM | POA: Diagnosis not present

## 2021-10-01 DIAGNOSIS — F331 Major depressive disorder, recurrent, moderate: Secondary | ICD-10-CM | POA: Diagnosis not present

## 2021-10-19 DIAGNOSIS — F0150 Vascular dementia without behavioral disturbance: Secondary | ICD-10-CM | POA: Diagnosis not present

## 2021-10-19 DIAGNOSIS — G4701 Insomnia due to medical condition: Secondary | ICD-10-CM | POA: Diagnosis not present

## 2021-10-19 DIAGNOSIS — F331 Major depressive disorder, recurrent, moderate: Secondary | ICD-10-CM | POA: Diagnosis not present

## 2021-10-21 DIAGNOSIS — F331 Major depressive disorder, recurrent, moderate: Secondary | ICD-10-CM | POA: Diagnosis not present

## 2021-11-12 DIAGNOSIS — Z79899 Other long term (current) drug therapy: Secondary | ICD-10-CM | POA: Diagnosis not present

## 2021-11-18 ENCOUNTER — Encounter (HOSPITAL_COMMUNITY): Payer: Self-pay

## 2021-11-18 ENCOUNTER — Observation Stay (HOSPITAL_COMMUNITY)
Admission: EM | Admit: 2021-11-18 | Discharge: 2021-11-19 | Disposition: A | Discharge status: Skilled Nursing Facility | Payer: PPO | Attending: Family Medicine | Admitting: Family Medicine

## 2021-11-18 ENCOUNTER — Emergency Department (HOSPITAL_COMMUNITY): Payer: PPO

## 2021-11-18 ENCOUNTER — Other Ambulatory Visit: Payer: Self-pay

## 2021-11-18 DIAGNOSIS — Z79899 Other long term (current) drug therapy: Secondary | ICD-10-CM | POA: Diagnosis not present

## 2021-11-18 DIAGNOSIS — E1122 Type 2 diabetes mellitus with diabetic chronic kidney disease: Secondary | ICD-10-CM | POA: Insufficient documentation

## 2021-11-18 DIAGNOSIS — Z20822 Contact with and (suspected) exposure to covid-19: Secondary | ICD-10-CM | POA: Diagnosis not present

## 2021-11-18 DIAGNOSIS — F0390 Unspecified dementia without behavioral disturbance: Secondary | ICD-10-CM | POA: Insufficient documentation

## 2021-11-18 DIAGNOSIS — R4182 Altered mental status, unspecified: Secondary | ICD-10-CM

## 2021-11-18 DIAGNOSIS — D631 Anemia in chronic kidney disease: Secondary | ICD-10-CM | POA: Diagnosis not present

## 2021-11-18 DIAGNOSIS — S79911A Unspecified injury of right hip, initial encounter: Secondary | ICD-10-CM | POA: Diagnosis present

## 2021-11-18 DIAGNOSIS — I10 Essential (primary) hypertension: Secondary | ICD-10-CM | POA: Diagnosis present

## 2021-11-18 DIAGNOSIS — Z87891 Personal history of nicotine dependence: Secondary | ICD-10-CM | POA: Diagnosis not present

## 2021-11-18 DIAGNOSIS — Z7982 Long term (current) use of aspirin: Secondary | ICD-10-CM | POA: Insufficient documentation

## 2021-11-18 DIAGNOSIS — M25551 Pain in right hip: Principal | ICD-10-CM | POA: Insufficient documentation

## 2021-11-18 DIAGNOSIS — N184 Chronic kidney disease, stage 4 (severe): Secondary | ICD-10-CM | POA: Diagnosis not present

## 2021-11-18 DIAGNOSIS — Z8673 Personal history of transient ischemic attack (TIA), and cerebral infarction without residual deficits: Secondary | ICD-10-CM | POA: Insufficient documentation

## 2021-11-18 DIAGNOSIS — Z853 Personal history of malignant neoplasm of breast: Secondary | ICD-10-CM | POA: Diagnosis not present

## 2021-11-18 DIAGNOSIS — I129 Hypertensive chronic kidney disease with stage 1 through stage 4 chronic kidney disease, or unspecified chronic kidney disease: Secondary | ICD-10-CM | POA: Insufficient documentation

## 2021-11-18 DIAGNOSIS — W19XXXA Unspecified fall, initial encounter: Secondary | ICD-10-CM | POA: Diagnosis not present

## 2021-11-18 DIAGNOSIS — G9341 Metabolic encephalopathy: Secondary | ICD-10-CM | POA: Diagnosis present

## 2021-11-18 DIAGNOSIS — D649 Anemia, unspecified: Secondary | ICD-10-CM | POA: Diagnosis present

## 2021-11-18 DIAGNOSIS — E1169 Type 2 diabetes mellitus with other specified complication: Secondary | ICD-10-CM | POA: Diagnosis present

## 2021-11-18 LAB — CBC WITH DIFFERENTIAL/PLATELET
Abs Immature Granulocytes: 0.03 10*3/uL (ref 0.00–0.07)
Basophils Absolute: 0 10*3/uL (ref 0.0–0.1)
Basophils Relative: 0 %
Eosinophils Absolute: 0.4 10*3/uL (ref 0.0–0.5)
Eosinophils Relative: 6 %
HCT: 32.5 % — ABNORMAL LOW (ref 36.0–46.0)
Hemoglobin: 10.3 g/dL — ABNORMAL LOW (ref 12.0–15.0)
Immature Granulocytes: 0 %
Lymphocytes Relative: 23 %
Lymphs Abs: 1.7 10*3/uL (ref 0.7–4.0)
MCH: 29.7 pg (ref 26.0–34.0)
MCHC: 31.7 g/dL (ref 30.0–36.0)
MCV: 93.7 fL (ref 80.0–100.0)
Monocytes Absolute: 0.6 10*3/uL (ref 0.1–1.0)
Monocytes Relative: 8 %
Neutro Abs: 4.6 10*3/uL (ref 1.7–7.7)
Neutrophils Relative %: 63 %
Platelets: 260 10*3/uL (ref 150–400)
RBC: 3.47 MIL/uL — ABNORMAL LOW (ref 3.87–5.11)
RDW: 13.2 % (ref 11.5–15.5)
WBC: 7.3 10*3/uL (ref 4.0–10.5)
nRBC: 0 % (ref 0.0–0.2)

## 2021-11-18 LAB — URINALYSIS, ROUTINE W REFLEX MICROSCOPIC
Bilirubin Urine: NEGATIVE
Glucose, UA: NEGATIVE mg/dL
Hgb urine dipstick: NEGATIVE
Ketones, ur: NEGATIVE mg/dL
Leukocytes,Ua: NEGATIVE
Nitrite: NEGATIVE
Protein, ur: NEGATIVE mg/dL
Specific Gravity, Urine: 1.008 (ref 1.005–1.030)
pH: 5 (ref 5.0–8.0)

## 2021-11-18 LAB — BASIC METABOLIC PANEL
Anion gap: 10 (ref 5–15)
BUN: 36 mg/dL — ABNORMAL HIGH (ref 8–23)
CO2: 22 mmol/L (ref 22–32)
Calcium: 9.2 mg/dL (ref 8.9–10.3)
Chloride: 108 mmol/L (ref 98–111)
Creatinine, Ser: 2.01 mg/dL — ABNORMAL HIGH (ref 0.44–1.00)
GFR, Estimated: 23 mL/min — ABNORMAL LOW (ref >60)
Glucose, Bld: 96 mg/dL (ref 70–99)
Potassium: 3.6 mmol/L (ref 3.5–5.1)
Sodium: 140 mmol/L (ref 135–145)

## 2021-11-18 LAB — TYPE AND SCREEN
ABO/RH(D): A POS
Antibody Screen: NEGATIVE

## 2021-11-18 LAB — RESP PANEL BY RT-PCR (FLU A&B, COVID) ARPGX2
Influenza A by PCR: NEGATIVE
Influenza B by PCR: NEGATIVE
SARS Coronavirus 2 by RT PCR: NEGATIVE

## 2021-11-18 LAB — GLUCOSE, CAPILLARY: Glucose-Capillary: 126 mg/dL — ABNORMAL HIGH (ref 70–99)

## 2021-11-18 LAB — PROTIME-INR
INR: 0.9 (ref 0.8–1.2)
Prothrombin Time: 12.6 seconds (ref 11.4–15.2)

## 2021-11-18 MED ORDER — NETARSUDIL-LATANOPROST 0.02-0.005 % OP SOLN: 1.0000 [drp] | Freq: Every day | OPHTHALMIC | Status: DC

## 2021-11-18 MED ORDER — BRIMONIDINE TARTRATE 0.2 % OP SOLN
1.0000 [drp] | Freq: Three times a day (TID) | OPHTHALMIC | Status: DC
  Administered 2021-11-18 – 2021-11-19 (×2): 1 [drp] via OPHTHALMIC
  Filled 2021-11-18: qty 5

## 2021-11-18 MED ORDER — ENSURE ENLIVE PO LIQD
237.0000 mL | Freq: Two times a day (BID) | ORAL | Status: DC
  Administered 2021-11-19: 09:00:00 237 mL via ORAL

## 2021-11-18 MED ORDER — HYDRALAZINE HCL 25 MG PO TABS
25.0000 mg | ORAL_TABLET | Freq: Three times a day (TID) | ORAL | Status: DC
  Administered 2021-11-18 – 2021-11-19 (×2): 25 mg via ORAL
  Filled 2021-11-18 (×2): qty 1

## 2021-11-18 MED ORDER — HEPARIN SODIUM (PORCINE) 5000 UNIT/ML IJ SOLN
5000.0000 [IU] | Freq: Three times a day (TID) | INTRAMUSCULAR | Status: DC
  Administered 2021-11-18 – 2021-11-19 (×2): 5000 [IU] via SUBCUTANEOUS
  Filled 2021-11-18 (×2): qty 1

## 2021-11-18 MED ORDER — ONDANSETRON HCL 4 MG/2ML IJ SOLN: 4.0000 mg | Freq: Four times a day (QID) | INTRAMUSCULAR | Status: DC | PRN

## 2021-11-18 MED ORDER — ONDANSETRON HCL 4 MG PO TABS: 4.0000 mg | ORAL_TABLET | Freq: Four times a day (QID) | ORAL | Status: DC | PRN

## 2021-11-18 MED ORDER — SODIUM CHLORIDE 0.9% FLUSH: 3.0000 mL | INTRAVENOUS | Status: DC | PRN

## 2021-11-18 MED ORDER — SODIUM CHLORIDE 0.9% FLUSH: 3.0000 mL | Freq: Two times a day (BID) | INTRAVENOUS | Status: DC

## 2021-11-18 MED ORDER — INSULIN ASPART 100 UNIT/ML IJ SOLN: 0.0000 [IU] | Freq: Three times a day (TID) | INTRAMUSCULAR | Status: DC

## 2021-11-18 MED ORDER — VITAMIN D 25 MCG (1000 UNIT) PO TABS
2000.0000 [IU] | ORAL_TABLET | Freq: Every day | ORAL | Status: DC
  Administered 2021-11-18 – 2021-11-19 (×2): 2000 [IU] via ORAL
  Filled 2021-11-18 (×2): qty 2

## 2021-11-18 MED ORDER — AMLODIPINE BESYLATE 5 MG PO TABS
2.5000 mg | ORAL_TABLET | Freq: Every day | ORAL | Status: DC
  Administered 2021-11-19: 09:00:00 2.5 mg via ORAL
  Filled 2021-11-18: qty 1

## 2021-11-18 MED ORDER — MIRTAZAPINE 15 MG PO TABS
7.5000 mg | ORAL_TABLET | Freq: Every day | ORAL | Status: DC
  Administered 2021-11-18: 23:00:00 7.5 mg via ORAL
  Filled 2021-11-18: qty 1

## 2021-11-18 MED ORDER — ACETAMINOPHEN 650 MG RE SUPP: 650.0000 mg | Freq: Four times a day (QID) | RECTAL | Status: DC | PRN

## 2021-11-18 MED ORDER — SODIUM CHLORIDE 0.9 % IV SOLN: 250.0000 mL | INTRAVENOUS | Status: DC | PRN

## 2021-11-18 MED ORDER — POLYETHYLENE GLYCOL 3350 17 G PO PACK: 17.0000 g | PACK | Freq: Every day | ORAL | Status: DC | PRN

## 2021-11-18 MED ORDER — FAMOTIDINE 20 MG PO TABS
20.0000 mg | ORAL_TABLET | Freq: Every day | ORAL | Status: DC
  Administered 2021-11-18 – 2021-11-19 (×2): 20 mg via ORAL
  Filled 2021-11-18 (×2): qty 1

## 2021-11-18 MED ORDER — ACETAMINOPHEN 325 MG PO TABS: 650.0000 mg | ORAL_TABLET | Freq: Four times a day (QID) | ORAL | Status: DC | PRN

## 2021-11-18 MED ORDER — DORZOLAMIDE HCL-TIMOLOL MAL 2-0.5 % OP SOLN
1.0000 [drp] | Freq: Two times a day (BID) | OPHTHALMIC | Status: DC
  Administered 2021-11-18 – 2021-11-19 (×2): 1 [drp] via OPHTHALMIC
  Filled 2021-11-18: qty 10

## 2021-11-18 MED ORDER — CYCLOSPORINE 0.05 % OP EMUL
1.0000 [drp] | Freq: Two times a day (BID) | OPHTHALMIC | Status: DC
  Administered 2021-11-18 – 2021-11-19 (×2): 1 [drp] via OPHTHALMIC
  Filled 2021-11-18 (×2): qty 30

## 2021-11-18 MED ORDER — ASPIRIN EC 81 MG PO TBEC
81.0000 mg | DELAYED_RELEASE_TABLET | Freq: Every day | ORAL | Status: DC
  Administered 2021-11-19: 09:00:00 81 mg via ORAL
  Filled 2021-11-18: qty 1

## 2021-11-18 MED ORDER — VITAMIN B-12 1000 MCG PO TABS
1000.0000 ug | ORAL_TABLET | Freq: Every day | ORAL | Status: DC
  Administered 2021-11-19: 09:00:00 1000 ug via ORAL
  Filled 2021-11-18: qty 1

## 2021-11-18 MED ORDER — INSULIN ASPART 100 UNIT/ML IJ SOLN: 0.0000 [IU] | Freq: Every day | INTRAMUSCULAR | Status: DC

## 2021-11-18 MED ORDER — BISACODYL 10 MG RE SUPP: 10.0000 mg | Freq: Every day | RECTAL | Status: DC | PRN

## 2021-11-18 MED ORDER — SODIUM CHLORIDE 0.9 % IV SOLN: INTRAVENOUS | Status: AC

## 2021-11-18 NOTE — H&P (Signed)
Patient Demographics:    Nancy Medina, is a 87 y.o. female  MRN: [id number]   DOB - 1924-12-31  Admit Date - 11/18/2021  Outpatient Primary MD for the patient is Vilinda Boehringer, NP   Assessment & Plan:    Principal Problem:   Acute metabolic encephalopathy   1) acute metabolic encephalopathy/AMS--suspect medication and dehydration related -PTA patient was taking Remeron, methocarbamol clonazepam and lorazepam -Suspect prolonged effect of sedatives in the setting of worsening renal function and dehydration--with GFR of 23 -Okay to stop all except for Remeron 7.5 mg nightly -Work-up without evidence of acute neuro findings on imaging studies, no significant evidence for acute infection at this time -No history of seizures  2) social/ethics----plan of care and advanced directive discussed with patient and patient's daughter [name] at bedside patient is a DNR/DNI  3)CKD stage -IV -GFR currently around 23 with creatinine around 2  -This is not too far from patient's recent baseline suspect some degree of dehydration -IV fluids as ordered -renally adjust medications, avoid nephrotoxic agents / dehydration  / hypotension  4)HTN--stage 2, BP currently not at goal, restart amlodipine, and hydralazine  5)DM2- Use Novolog/Humalog Sliding scale insulin with Accu-Cheks/Fingersticks as ordered   6)S/p Fall--- get PT eval, CT head, CT C-spine, CT hip and pelvic imaging studies without acute findings -Patient apparently was lethargic which may have contributed to her fall  7)HLD--- patient is 87 years old , risk versus benefits of statins discussed, decision to stop statins  8) anemia of CKD--- hemoglobin 10.3, overall stable continue to monitor  Disposition/Need for in-Hospital Stay- patient unable to be discharged  at this time due to -dehydration and altered mentation suspect sedative medication related -Needs IV fluids and further observation -Possible discharge in a.m. if awake and able to tolerate oral intake  Dispo: The patient is from: ALF              Anticipated d/c is to: ALF              Anticipated d/c date is: 1 day              Patient currently is not medically stable to d/c. Barriers: Not Clinically Stable-    With History of - Reviewed by me  Past Medical History:  Diagnosis Date   Achilles tendinitis    Allergic rhinitis    Anemia of chronic disease    at least back to 2010   Anxiety    Benign paroxysmal positional vertigo    Cataract    Cerebrovascular accident ([place])    Chronic kidney disease (CKD), stage III (moderate) (HCC)    Degenerative disc disease, thoracic    Depression    Diverticulitis    DJD (degenerative joint disease)    DM (diabetes mellitus) ([place])    type II   GERD (gastroesophageal reflux disease)    HTN (hypertension)    HX: breast cancer    Hyperlipidemia  Hypertonicity of bladder    Knee pain, left    Leg pain    Lymphocytic colitis 2008   treated with Lialda short-term   Memory loss    Obesity    Osteoporosis, unspecified    Renal disease    stage III   Unspecified fall    Unspecified glaucoma(365.9)       Past Surgical History:  Procedure Laterality Date   Bladder tack  2000   BREAST LUMPECTOMY  2004   right   cataracts     CHOLECYSTECTOMY  1960   COLONOSCOPY  07/2007   lymphocytic colitis   ESOPHAGOGASTRODUODENOSCOPY  06/24/2011   Procedure: ESOPHAGOGASTRODUODENOSCOPY (EGD);  Surgeon: Dorothyann Peng, MD;  Location: [facility];  Service: Endoscopy;  Laterality: N/A;.  Peptic stricture, mild erosive reflux esophagitis, hiatal   FEMUR IM NAIL Left 04/28/2020   Procedure: INTRAMEDULLARY (IM) NAIL FEMORAL;  Surgeon: Marchia Bond, MD;  Location: [facility];  Service: Orthopedics;  Laterality: Left;   FLEXIBLE SIGMOIDOSCOPY  11/25/2011    MODERATE DIVERTICULOSIS/INTERNAL HEMORRHOIDS   S/P Hysterectomy  2000   benign reasons   SAVORY DILATION  06/24/2011   Procedure: SAVORY DILATION;  Surgeon: Dorothyann Peng, MD;  Location: [facility];  Service: Endoscopy;  Laterality: N/A;      Chief Complaint  Patient presents with   Fall      HPI:    Nancy Medina  is a 87 y.o. female with past medical history relevant for stage II hypertension, DM, dementia, HLD, CKD 4, presents from long-term care facility after apparently falling while being lethargic -- In the ED UA is not consistent with UTI -BMP with creatinine of 2 and GFR of 23 which is not far from patient's prior baseline -WBC 7.3 hemoglobin 10.3 -History obtained from patient's daughter [name] at bedside- No fever  , no vomiting or diarrhea apparently -Apparently oral intake has not been great -Concerned that she may have been given sedatives and now appears to be sleeping longer than expected -CT head, CT C-spine, CT hip and pelvic imaging studies without acute findings - -EDP requested admission for observation due to persistent lethargy    Review of systems:    In addition to the HPI above,   A full Review of  Systems was done, all other systems reviewed are negative except as noted above in HPI , .    Social History:  Reviewed by me    Social History   Tobacco Use   Smoking status: Former    Packs/day: 1.00    Types: Cigarettes   Smokeless tobacco: Never   Tobacco comments:    30 yrs ago  Substance Use Topics   Alcohol use: No       Family History :  Reviewed by me    Family History  Problem Relation Age of Onset   Arthritis Mother        died at age 64   Other Father        died at age 70 - horse accident   Colon cancer Neg Hx            Liver disease Neg Hx            GI problems Neg Hx               Home Medications:   Prior to Admission medications   Medication Sig Start Date End Date Taking? Authorizing Provider   acetaminophen (TYLENOL) 650 MG CR tablet  Take 650 mg by mouth every 6 (six) hours. 05/05/20  Yes [provider]  amLODipine (NORVASC) 2.5 MG tablet Take 2.5 mg by mouth daily. 11/17/21  Yes [provider]  aspirin 81 MG tablet Take 81 mg by mouth daily.     Yes [provider]  brimonidine (ALPHAGAN) 0.2 % ophthalmic solution Place 1 drop into both eyes every 8 (eight) hours. 02/23/21  Yes [provider]  Cholecalciferol (VITAMIN D) 2000 units CAPS Take 1 capsule by mouth daily.   Yes [provider]  clonazePAM (KLONOPIN) 0.5 MG tablet Take 0.5 mg by mouth daily as needed for anxiety. 07/01/21  Yes [provider]  cycloSPORINE (RESTASIS) 0.05 % ophthalmic emulsion Place 1 drop into both eyes 2 (two) times daily.   Yes [provider]  dorzolamide-timolol (COSOPT) 22.3-6.8 MG/ML ophthalmic solution Place 1 drop into both eyes in the morning and at bedtime. 07/15/20  Yes [provider]  famotidine (PEPCID) 20 MG tablet Take 20 mg by mouth daily. 10/21/21  Yes [provider]  furosemide (LASIX) 20 MG tablet Take 20 mg by mouth daily.   Yes [provider]  glimepiride (AMARYL) 1 MG tablet Take 1 mg by mouth daily.  04/24/18  Yes [provider]  hydrALAZINE (APRESOLINE) 10 MG tablet Take 25 mg by mouth 3 (three) times daily. 07/19/21  Yes [provider]  loperamide (IMODIUM A-D) 2 MG tablet Take 2 mg by mouth 4 (four) times daily as needed for diarrhea or loose stools.   Yes [provider]  LORazepam (ATIVAN) 0.5 MG tablet Take 0.5 mg by mouth daily as needed for anxiety. 10/24/21  Yes [provider]  methocarbamol (ROBAXIN) 500 MG tablet Take 1 tablet (500 mg total) by mouth every 6 (six) hours as needed for muscle spasms. 04/30/20  Yes Pokhrel, Laxman, MD  mirtazapine (REMERON) 7.5 MG tablet Take 7.5 mg by mouth at bedtime. 07/01/21  Yes [provider]  ROCKLATAN  0.02-0.005 % SOLN Apply 1 drop to eye at bedtime. 02/20/21  Yes [provider]  sitaGLIPtin (JANUVIA) 25 MG tablet Take 1 tablet (25 mg total) by mouth daily. 03/20/18  Yes Tat, Shanon Brow, MD  vitamin B-12 (CYANOCOBALAMIN) 1000 MCG tablet Take 1 tablet (1,000 mcg total) by mouth daily. 03/20/18  Yes Tat, Shanon Brow, MD  amLODipine (NORVASC) 10 MG tablet Take 1 tablet (10 mg total) by mouth daily. Patient not taking: Reported on 11/18/2021 07/07/18   Dhungel, Flonnie Overman, MD  NON FORMULARY Diet: _____ Regular, ______ NAS, ___x____Consistent Carbohydrate, _______NPO _____Other    [provider]     Allergies:     Allergies  Allergen Reactions   Metformin     This allergy is not listed under records with [facility] facility. Past records states that patient "felt poor" as a reaction to taking this medication     Physical Exam:   Vitals  Blood pressure (!) 165/76, pulse 75, temperature 98 F (36.7 C), temperature source Oral, resp. rate 20, height 5\' 6"  (1.676 m), weight 68 kg, SpO2 100 %.  Physical Examination: General appearance -lethargic, in no acute distress  mental status -sleepy Eyes - sclera anicteric Mouth--dry oral mucosa Neck - supple, no JVD elevation , Chest - clear  to auscultation bilaterally, symmetrical air movement,  Heart - S1 and S2 normal, regular  Abdomen - soft, nontender, nondistended, no masses or organomegaly Neurological -lethargy without new focal deficits per se  extremities - no pedal edema noted,  intact peripheral pulses  Skin - warm, dry     Data Review:    CBC Recent Labs  Lab 11/18/21 0957  WBC 7.3  HGB 10.3*  HCT 32.5*  PLT 260  MCV 93.7  MCH 29.7  MCHC 31.7  RDW 13.2  LYMPHSABS 1.7  MONOABS 0.6  EOSABS 0.4  BASOSABS 0.0   ------------------------------------------------------------------------------------------------------------------  Chemistries  Recent Labs  Lab 11/18/21 0957  NA 140  K 3.6  CL 108  CO2  22  GLUCOSE 96  BUN 36*  CREATININE 2.01*  CALCIUM 9.2   ------------------------------------------------------------------------------------------------------------------ estimated creatinine clearance is 16 mL/min (A) (by C-G formula based on SCr of 2.01 mg/dL (H)). ------------------------------------------------------------------------------------------------------------------ No results for input(s): TSH, T4TOTAL, T3FREE, THYROIDAB in the last 72 hours.  Invalid input(s): FREET3   Coagulation profile Recent Labs  Lab 11/18/21 0957  INR 0.9   ------------------------------------------------------------------------------------------------------------------- No results for input(s): DDIMER in the last 72 hours. -------------------------------------------------------------------------------------------------------------------  Cardiac Enzymes No results for input(s): CKMB, TROPONINI, MYOGLOBIN in the last 168 hours.  Invalid input(s): CK ------------------------------------------------------------------------------------------------------------------    Component Value Date/Time   BNP 462.0 (H) 06/27/2018 1543     ---------------------------------------------------------------------------------------------------------------  Urinalysis    Component Value Date/Time   COLORURINE STRAW (A) 11/18/2021 1356   APPEARANCEUR CLEAR 11/18/2021 1356   LABSPEC 1.008 11/18/2021 1356   PHURINE 5.0 11/18/2021 1356   GLUCOSEU NEGATIVE 11/18/2021 1356   [place] 11/18/2021 1356   HGBUR negative 06/04/2008 0811   BILIRUBINUR NEGATIVE 11/18/2021 1356   [place] 11/18/2021 1356   PROTEINUR NEGATIVE 11/18/2021 1356   UROBILINOGEN 0.2 06/04/2008 0811   NITRITE NEGATIVE 11/18/2021 1356   LEUKOCYTESUR NEGATIVE 11/18/2021 1356    ----------------------------------------------------------------------------------------------------------------   Imaging Results:    CT  HEAD WO CONTRAST  Result Date: 11/18/2021 CLINICAL DATA:  87 year old female with history of trauma from a fall at a nursing facility. Head and neck pain. EXAM: CT HEAD WITHOUT CONTRAST CT CERVICAL SPINE WITHOUT CONTRAST TECHNIQUE: Multidetector CT imaging of the head and cervical spine was performed following the standard protocol without intravenous contrast. Multiplanar CT image reconstructions of the cervical spine were also generated. COMPARISON:  MRI of the brain 07/27/2021. CT the cervical spine 07/27/2021. Head CT 07/27/2021. FINDINGS: CT HEAD FINDINGS Brain: Moderate cerebral and cerebellar atrophy. Patchy and confluent areas of decreased attenuation are noted throughout the deep and periventricular white matter of the cerebral hemispheres bilaterally, compatible with chronic microvascular ischemic disease. Well-defined areas of low attenuation in the head of the caudate nuclei bilaterally, compatible with old lacunar infarcts. Well-defined area of low attenuation in the left occipital region, compatible with encephalomalacia from remote left PCA territory infarct, similar to the prior examination. Well-defined areas of low attenuation in the cerebellar hemispheres bilaterally, compatible with old lacunar infarcts. No evidence of acute infarction, hemorrhage, hydrocephalus, extra-axial collection or mass lesion/mass effect. Vascular: No hyperdense vessel or unexpected calcification. Skull: Normal. Negative for fracture or focal lesion. Sinuses/Orbits: No acute finding. Other: None. CT CERVICAL SPINE FINDINGS Alignment: Normal. Skull base and vertebrae: No acute fracture. No primary bone lesion or focal pathologic process. Soft tissues and spinal canal: No prevertebral fluid or swelling. No visible canal hematoma. Disc levels: Multilevel degenerative disc disease most evident at C5-C6 and C6-C7. Mild multilevel facet arthropathy. Upper chest: Unremarkable. Other: There are no aggressive appearing lytic or  blastic lesions noted in the visualized portions of the skeleton. IMPRESSION: 1. No evidence of significant acute traumatic injury to the skull, brain or cervical spine. 2.  Moderate cerebral and cerebellar atrophy with extensive chronic microvascular ischemic changes in the cerebral white matter, old lacunar infarcts in the basal ganglia and cerebellar hemispheres bilaterally, and old left PCA territory infarct, similar to prior examinations, as above. 3. Multilevel degenerative disc disease and cervical spondylosis, as above. Electronically Signed   By: Vinnie Langton M.D.   On: 11/18/2021 09:48   CT CERVICAL SPINE WO CONTRAST  Result Date: 11/18/2021 CLINICAL DATA:  87 year old female with history of trauma from a fall at a nursing facility. Head and neck pain. EXAM: CT HEAD WITHOUT CONTRAST CT CERVICAL SPINE WITHOUT CONTRAST TECHNIQUE: Multidetector CT imaging of the head and cervical spine was performed following the standard protocol without intravenous contrast. Multiplanar CT image reconstructions of the cervical spine were also generated. COMPARISON:  MRI of the brain 07/27/2021. CT the cervical spine 07/27/2021. Head CT 07/27/2021. FINDINGS: CT HEAD FINDINGS Brain: Moderate cerebral and cerebellar atrophy. Patchy and confluent areas of decreased attenuation are noted throughout the deep and periventricular white matter of the cerebral hemispheres bilaterally, compatible with chronic microvascular ischemic disease. Well-defined areas of low attenuation in the head of the caudate nuclei bilaterally, compatible with old lacunar infarcts. Well-defined area of low attenuation in the left occipital region, compatible with encephalomalacia from remote left PCA territory infarct, similar to the prior examination. Well-defined areas of low attenuation in the cerebellar hemispheres bilaterally, compatible with old lacunar infarcts. No evidence of acute infarction, hemorrhage, hydrocephalus, extra-axial  collection or mass lesion/mass effect. Vascular: No hyperdense vessel or unexpected calcification. Skull: Normal. Negative for fracture or focal lesion. Sinuses/Orbits: No acute finding. Other: None. CT CERVICAL SPINE FINDINGS Alignment: Normal. Skull base and vertebrae: No acute fracture. No primary bone lesion or focal pathologic process. Soft tissues and spinal canal: No prevertebral fluid or swelling. No visible canal hematoma. Disc levels: Multilevel degenerative disc disease most evident at C5-C6 and C6-C7. Mild multilevel facet arthropathy. Upper chest: Unremarkable. Other: There are no aggressive appearing lytic or blastic lesions noted in the visualized portions of the skeleton. IMPRESSION: 1. No evidence of significant acute traumatic injury to the skull, brain or cervical spine. 2. Moderate cerebral and cerebellar atrophy with extensive chronic microvascular ischemic changes in the cerebral white matter, old lacunar infarcts in the basal ganglia and cerebellar hemispheres bilaterally, and old left PCA territory infarct, similar to prior examinations, as above. 3. Multilevel degenerative disc disease and cervical spondylosis, as above. Electronically Signed   By: Vinnie Langton M.D.   On: 11/18/2021 09:48   CT PELVIS WO CONTRAST  Result Date: 11/18/2021 CLINICAL DATA:  Hip trauma, fracture suspected, xray done EXAM: CT PELVIS WITHOUT CONTRAST TECHNIQUE: Multidetector CT imaging of the pelvis was performed following the standard protocol without intravenous contrast. COMPARISON:  Same day hip radiograph FINDINGS: Bones/Joint/Cartilage There is no evidence of acute fracture. There is moderate bilateral hip osteoarthritis. Prior left proximal femur fixation, with chronic adjacent heterotopic ossification. Moderate degenerative changes of the symphysis pubis. Benign bone island in the right femoral head. Severe lower lumbar spine degenerative disc disease seen at L3-L4, L4-L5, and L5-S1. Bilateral SI  joint degenerative change. Ligaments Suboptimally assessed by CT. Muscles and Tendons There is severe gluteus minimus muscle atrophy bilaterally. Soft tissues No focal fluid collection. In the pelvis, there is extensive sigmoid diverticulosis. The bladder is markedly distended. Aortoiliac atherosclerotic disease. Prior hysterectomy. There is a hyperdense nodule adjacent to the rectum measuring 1.5 cm, unchanged from prior exam in September 2019 when it also measured 1.5 cm,  given stability this favors a benign entity and may be a dense, non gas-filled diverticula. IMPRESSION: No evidence of acute fracture. Moderate bilateral hip osteoarthritis. Prior left proximal femur fixation with chronic adjacent heterotopic ossification. Severe lower lumbar spine degenerative disc disease. Markedly distended urinary bladder. Hyperdense nodule adjacent to the rectum measuring 1.5 cm, unchanged from prior exam in September 2019. Given stability this favors a benign entity such as a complicated tail gut duplication cyst. Indolent neoplasm cannot be excluded. If desired, MRI with and without contrast would be useful for further evaluation. Electronically Signed   By: Maurine Simmering M.D.   On: 11/18/2021 11:53   DG Hip Unilat With Pelvis 2-3 Views Right  Result Date: 11/18/2021 CLINICAL DATA:  Fall, right hip pain EXAM: DG HIP (WITH OR WITHOUT PELVIS) 2-3V RIGHT COMPARISON:  Hip radiographs 04/28/2020 FINDINGS: Postsurgical changes reflecting intramedullary nail fixation of the left femur are noted, incompletely imaged. The imaged hardware is within normal limits, without evidence of complication. There is no perihardware fracture. There is a cortical step-off along the inferior margin of the right superior pubic ramus seen only on the first image. There is no other evidence of fracture. Femoroacetabular alignment is normal bilaterally. There is moderate joint space narrowing bilaterally. The SI joints and symphysis pubis are  intact. IMPRESSION: Suspected fracture of the right superior pubic ramus. Recommend CT of the pelvis for further evaluation. Electronically Signed   By: Valetta Mole M.D.   On: 11/18/2021 10:18    Radiological Exams on Admission: CT HEAD WO CONTRAST  Result Date: 11/18/2021 CLINICAL DATA:  87 year old female with history of trauma from a fall at a nursing facility. Head and neck pain. EXAM: CT HEAD WITHOUT CONTRAST CT CERVICAL SPINE WITHOUT CONTRAST TECHNIQUE: Multidetector CT imaging of the head and cervical spine was performed following the standard protocol without intravenous contrast. Multiplanar CT image reconstructions of the cervical spine were also generated. COMPARISON:  MRI of the brain 07/27/2021. CT the cervical spine 07/27/2021. Head CT 07/27/2021. FINDINGS: CT HEAD FINDINGS Brain: Moderate cerebral and cerebellar atrophy. Patchy and confluent areas of decreased attenuation are noted throughout the deep and periventricular white matter of the cerebral hemispheres bilaterally, compatible with chronic microvascular ischemic disease. Well-defined areas of low attenuation in the head of the caudate nuclei bilaterally, compatible with old lacunar infarcts. Well-defined area of low attenuation in the left occipital region, compatible with encephalomalacia from remote left PCA territory infarct, similar to the prior examination. Well-defined areas of low attenuation in the cerebellar hemispheres bilaterally, compatible with old lacunar infarcts. No evidence of acute infarction, hemorrhage, hydrocephalus, extra-axial collection or mass lesion/mass effect. Vascular: No hyperdense vessel or unexpected calcification. Skull: Normal. Negative for fracture or focal lesion. Sinuses/Orbits: No acute finding. Other: None. CT CERVICAL SPINE FINDINGS Alignment: Normal. Skull base and vertebrae: No acute fracture. No primary bone lesion or focal pathologic process. Soft tissues and spinal canal: No prevertebral  fluid or swelling. No visible canal hematoma. Disc levels: Multilevel degenerative disc disease most evident at C5-C6 and C6-C7. Mild multilevel facet arthropathy. Upper chest: Unremarkable. Other: There are no aggressive appearing lytic or blastic lesions noted in the visualized portions of the skeleton. IMPRESSION: 1. No evidence of significant acute traumatic injury to the skull, brain or cervical spine. 2. Moderate cerebral and cerebellar atrophy with extensive chronic microvascular ischemic changes in the cerebral white matter, old lacunar infarcts in the basal ganglia and cerebellar hemispheres bilaterally, and old left PCA territory infarct, similar to prior examinations,  as above. 3. Multilevel degenerative disc disease and cervical spondylosis, as above. Electronically Signed   By: Vinnie Langton M.D.   On: 11/18/2021 09:48   CT CERVICAL SPINE WO CONTRAST  Result Date: 11/18/2021 CLINICAL DATA:  87 year old female with history of trauma from a fall at a nursing facility. Head and neck pain. EXAM: CT HEAD WITHOUT CONTRAST CT CERVICAL SPINE WITHOUT CONTRAST TECHNIQUE: Multidetector CT imaging of the head and cervical spine was performed following the standard protocol without intravenous contrast. Multiplanar CT image reconstructions of the cervical spine were also generated. COMPARISON:  MRI of the brain 07/27/2021. CT the cervical spine 07/27/2021. Head CT 07/27/2021. FINDINGS: CT HEAD FINDINGS Brain: Moderate cerebral and cerebellar atrophy. Patchy and confluent areas of decreased attenuation are noted throughout the deep and periventricular white matter of the cerebral hemispheres bilaterally, compatible with chronic microvascular ischemic disease. Well-defined areas of low attenuation in the head of the caudate nuclei bilaterally, compatible with old lacunar infarcts. Well-defined area of low attenuation in the left occipital region, compatible with encephalomalacia from remote left PCA territory  infarct, similar to the prior examination. Well-defined areas of low attenuation in the cerebellar hemispheres bilaterally, compatible with old lacunar infarcts. No evidence of acute infarction, hemorrhage, hydrocephalus, extra-axial collection or mass lesion/mass effect. Vascular: No hyperdense vessel or unexpected calcification. Skull: Normal. Negative for fracture or focal lesion. Sinuses/Orbits: No acute finding. Other: None. CT CERVICAL SPINE FINDINGS Alignment: Normal. Skull base and vertebrae: No acute fracture. No primary bone lesion or focal pathologic process. Soft tissues and spinal canal: No prevertebral fluid or swelling. No visible canal hematoma. Disc levels: Multilevel degenerative disc disease most evident at C5-C6 and C6-C7. Mild multilevel facet arthropathy. Upper chest: Unremarkable. Other: There are no aggressive appearing lytic or blastic lesions noted in the visualized portions of the skeleton. IMPRESSION: 1. No evidence of significant acute traumatic injury to the skull, brain or cervical spine. 2. Moderate cerebral and cerebellar atrophy with extensive chronic microvascular ischemic changes in the cerebral white matter, old lacunar infarcts in the basal ganglia and cerebellar hemispheres bilaterally, and old left PCA territory infarct, similar to prior examinations, as above. 3. Multilevel degenerative disc disease and cervical spondylosis, as above. Electronically Signed   By: Vinnie Langton M.D.   On: 11/18/2021 09:48   CT PELVIS WO CONTRAST  Result Date: 11/18/2021 CLINICAL DATA:  Hip trauma, fracture suspected, xray done EXAM: CT PELVIS WITHOUT CONTRAST TECHNIQUE: Multidetector CT imaging of the pelvis was performed following the standard protocol without intravenous contrast. COMPARISON:  Same day hip radiograph FINDINGS: Bones/Joint/Cartilage There is no evidence of acute fracture. There is moderate bilateral hip osteoarthritis. Prior left proximal femur fixation, with chronic  adjacent heterotopic ossification. Moderate degenerative changes of the symphysis pubis. Benign bone island in the right femoral head. Severe lower lumbar spine degenerative disc disease seen at L3-L4, L4-L5, and L5-S1. Bilateral SI joint degenerative change. Ligaments Suboptimally assessed by CT. Muscles and Tendons There is severe gluteus minimus muscle atrophy bilaterally. Soft tissues No focal fluid collection. In the pelvis, there is extensive sigmoid diverticulosis. The bladder is markedly distended. Aortoiliac atherosclerotic disease. Prior hysterectomy. There is a hyperdense nodule adjacent to the rectum measuring 1.5 cm, unchanged from prior exam in September 2019 when it also measured 1.5 cm, given stability this favors a benign entity and may be a dense, non gas-filled diverticula. IMPRESSION: No evidence of acute fracture. Moderate bilateral hip osteoarthritis. Prior left proximal femur fixation with chronic adjacent heterotopic ossification. Severe lower  lumbar spine degenerative disc disease. Markedly distended urinary bladder. Hyperdense nodule adjacent to the rectum measuring 1.5 cm, unchanged from prior exam in September 2019. Given stability this favors a benign entity such as a complicated tail gut duplication cyst. Indolent neoplasm cannot be excluded. If desired, MRI with and without contrast would be useful for further evaluation. Electronically Signed   By: Maurine Simmering M.D.   On: 11/18/2021 11:53   DG Hip Unilat With Pelvis 2-3 Views Right  Result Date: 11/18/2021 CLINICAL DATA:  Fall, right hip pain EXAM: DG HIP (WITH OR WITHOUT PELVIS) 2-3V RIGHT COMPARISON:  Hip radiographs 04/28/2020 FINDINGS: Postsurgical changes reflecting intramedullary nail fixation of the left femur are noted, incompletely imaged. The imaged hardware is within normal limits, without evidence of complication. There is no perihardware fracture. There is a cortical step-off along the inferior margin of the right  superior pubic ramus seen only on the first image. There is no other evidence of fracture. Femoroacetabular alignment is normal bilaterally. There is moderate joint space narrowing bilaterally. The SI joints and symphysis pubis are intact. IMPRESSION: Suspected fracture of the right superior pubic ramus. Recommend CT of the pelvis for further evaluation. Electronically Signed   By: Valetta Mole M.D.   On: 11/18/2021 10:18    DVT Prophylaxis -SCD  AM Labs Ordered, also please review Full Orders  Family Communication: Admission, patients condition and plan of care including tests being ordered have been discussed with the patient and daughter Jeannene Patella who indicate understanding and agree with the plan   Code Status - DNR  Likely DC to  ALF   Condition   stable  Roxan Hockey M.D on 11/18/2021 at 6:51 PM Go to [email or web address] -  for contact info  [facility] - Office  [phone]

## 2021-11-18 NOTE — ED Triage Notes (Addendum)
BIB EMS from [facility] after fall complaining of right hip pain.Marland Kitchen + pedal pulse

## 2021-11-18 NOTE — ED Provider Notes (Signed)
[facility] EMERGENCY DEPARTMENT Provider Note  CSN: [id number] Arrival date & time: 11/18/21 6440    History Chief Complaint  Patient presents with   Lytle Michaels    Nancy Medina is a 87 y.o. female with history of prior stroke brought to the ED via EMS from [place] after a fall. She is unable to provide any history. She was apparently complaining of R hip pain and noted by EMS to have shortening and rotation concerning for hip fracture. Unknown if she had head injury. Level 5 caveat applies.    Past Medical History:  Diagnosis Date   Achilles tendinitis    Allergic rhinitis    Anemia of chronic disease    at least back to 2010   Anxiety    Benign paroxysmal positional vertigo    Cataract    Cerebrovascular accident [facility])    Chronic kidney disease (CKD), stage III (moderate) (HCC)    Degenerative disc disease, thoracic    Depression    Diverticulitis    DJD (degenerative joint disease)    DM (diabetes mellitus) ([place])    type II   GERD (gastroesophageal reflux disease)    HTN (hypertension)    HX: breast cancer    Hyperlipidemia    Hypertonicity of bladder    Knee pain, left    Leg pain    Lymphocytic colitis 2008   treated with Lialda short-term   Memory loss    Obesity    Osteoporosis, unspecified    Renal disease    stage III   Unspecified fall    Unspecified glaucoma(365.9)     Past Surgical History:  Procedure Laterality Date   Bladder tack  2000   BREAST LUMPECTOMY  2004   right   cataracts     CHOLECYSTECTOMY  1960   COLONOSCOPY  07/2007   lymphocytic colitis   ESOPHAGOGASTRODUODENOSCOPY  06/24/2011   Procedure: ESOPHAGOGASTRODUODENOSCOPY (EGD);  Surgeon: Dorothyann Peng, MD;  Location: [facility] ENDO SUITE;  Service: Endoscopy;  Laterality: N/A;.  Peptic stricture, mild erosive reflux esophagitis, hiatal   FEMUR IM NAIL Left 04/28/2020   Procedure: INTRAMEDULLARY (IM) NAIL FEMORAL;  Surgeon: Marchia Bond, MD;  Location: [facility] ORS;  Service: Orthopedics;   Laterality: Left;   FLEXIBLE SIGMOIDOSCOPY  11/25/2011   MODERATE DIVERTICULOSIS/INTERNAL HEMORRHOIDS   S/P Hysterectomy  2000   benign reasons   SAVORY DILATION  06/24/2011   Procedure: SAVORY DILATION;  Surgeon: Dorothyann Peng, MD;  Location: [facility] ENDO SUITE;  Service: Endoscopy;  Laterality: N/A;    Family History  Problem Relation Age of Onset   Arthritis Mother        died at age 10   Other Father        died at age 29 - horse accident   Colon cancer Neg Hx            Liver disease Neg Hx            GI problems Neg Hx             Social History   Tobacco Use   Smoking status: Former    Packs/day: 1.00    Types: Cigarettes   Smokeless tobacco: Never   Tobacco comments:    30 yrs ago  Vaping Use   Vaping Use: Never used  Substance Use Topics   Alcohol use: No   Drug use: No     Home Medications Prior to Admission medications   Medication Sig Start Date  End Date Taking? Authorizing Provider  acetaminophen (TYLENOL) 650 MG CR tablet Take 650 mg by mouth every 6 (six) hours. 05/05/20  Yes [provider]  amLODipine (NORVASC) 2.5 MG tablet Take 2.5 mg by mouth daily. 11/17/21  Yes [provider]  aspirin 81 MG tablet Take 81 mg by mouth daily.     Yes [provider]  brimonidine (ALPHAGAN) 0.2 % ophthalmic solution Place 1 drop into both eyes every 8 (eight) hours. 02/23/21  Yes [provider]  Cholecalciferol (VITAMIN D) 2000 units CAPS Take 1 capsule by mouth daily.   Yes [provider]  clonazePAM (KLONOPIN) 0.5 MG tablet Take 0.5 mg by mouth daily as needed for anxiety. 07/01/21  Yes [provider]  cycloSPORINE (RESTASIS) 0.05 % ophthalmic emulsion Place 1 drop into both eyes 2 (two) times daily.   Yes [provider]  dorzolamide-timolol (COSOPT) 22.3-6.8 MG/ML ophthalmic solution Place 1 drop into both eyes in the morning and at bedtime. 07/15/20  Yes [provider]  famotidine (PEPCID) 20 MG  tablet Take 20 mg by mouth daily. 10/21/21  Yes [provider]  furosemide (LASIX) 20 MG tablet Take 20 mg by mouth daily.   Yes [provider]  glimepiride (AMARYL) 1 MG tablet Take 1 mg by mouth daily.  04/24/18  Yes [provider]  hydrALAZINE (APRESOLINE) 10 MG tablet Take 25 mg by mouth 3 (three) times daily. 07/19/21  Yes [provider]  loperamide (IMODIUM A-D) 2 MG tablet Take 2 mg by mouth 4 (four) times daily as needed for diarrhea or loose stools.   Yes [provider]  LORazepam (ATIVAN) 0.5 MG tablet Take 0.5 mg by mouth daily as needed for anxiety. 10/24/21  Yes [provider]  methocarbamol (ROBAXIN) 500 MG tablet Take 1 tablet (500 mg total) by mouth every 6 (six) hours as needed for muscle spasms. 04/30/20  Yes Pokhrel, Laxman, MD  mirtazapine (REMERON) 7.5 MG tablet Take 7.5 mg by mouth at bedtime. 07/01/21  Yes [provider]  ROCKLATAN 0.02-0.005 % SOLN Apply 1 drop to eye at bedtime. 02/20/21  Yes [provider]  sitaGLIPtin (JANUVIA) 25 MG tablet Take 1 tablet (25 mg total) by mouth daily. 03/20/18  Yes Tat, Shanon Brow, MD  vitamin B-12 (CYANOCOBALAMIN) 1000 MCG tablet Take 1 tablet (1,000 mcg total) by mouth daily. 03/20/18  Yes Tat, Shanon Brow, MD  amLODipine (NORVASC) 10 MG tablet Take 1 tablet (10 mg total) by mouth daily. Patient not taking: Reported on 11/18/2021 07/07/18   Dhungel, Flonnie Overman, MD  NON FORMULARY Diet: _____ Regular, ______ NAS, ___x____Consistent Carbohydrate, _______NPO _____Other    [provider]     Allergies    Metformin   Review of Systems   Review of Systems Unable to assess due to mental status.    Physical Exam BP (!) 153/59    Pulse 63    Temp (!) 96.1 F (35.6 C) (Axillary)    Resp 15    Ht 5\' 6"  (1.676 m)    Wt 68 kg    SpO2 96%    BMI 24.21 kg/m   Physical Exam Vitals and nursing note reviewed.  Constitutional:      Appearance: Normal appearance.      Comments: somnolent  HENT:     Head: Normocephalic and atraumatic.     Nose: Nose normal.     Mouth/Throat:     Mouth: Mucous membranes are moist.  Eyes:  Extraocular Movements: Extraocular movements intact.     Conjunctiva/sclera: Conjunctivae normal.  Cardiovascular:     Rate and Rhythm: Normal rate.  Pulmonary:     Effort: Pulmonary effort is normal.     Breath sounds: Normal breath sounds.  Abdominal:     General: Abdomen is flat.     Palpations: Abdomen is soft.     Tenderness: There is no abdominal tenderness.  Musculoskeletal:        General: No swelling or deformity. Normal range of motion.     Cervical back: Neck supple. No tenderness.     Comments: She does not have shortening or rotation of either LE on my exam. She is able to ROM without signs of pain.   Skin:    General: Skin is warm and dry.  Neurological:     Mental Status: Mental status is at baseline. She is disoriented.  Psychiatric:        Mood and Affect: Mood normal.     ED Results / Procedures / Treatments   Labs (all labs ordered are listed, but only abnormal results are displayed) Labs Reviewed  CBC WITH DIFFERENTIAL/PLATELET - Abnormal; Notable for the following components:      Result Value   RBC 3.47 (*)    Hemoglobin 10.3 (*)    HCT 32.5 (*)    All other components within normal limits  BASIC METABOLIC PANEL - Abnormal; Notable for the following components:   BUN 36 (*)    Creatinine, Ser 2.01 (*)    GFR, Estimated 23 (*)    All other components within normal limits  URINALYSIS, ROUTINE W REFLEX MICROSCOPIC - Abnormal; Notable for the following components:   Color, Urine STRAW (*)    All other components within normal limits  RESP PANEL BY RT-PCR (FLU A&B, COVID) ARPGX2  PROTIME-INR  TYPE AND SCREEN    EKG None  Radiology CT HEAD WO CONTRAST  Result Date: 11/18/2021 CLINICAL DATA:  87 year old female with history of trauma from a fall at a nursing facility. Head and neck  pain. EXAM: CT HEAD WITHOUT CONTRAST CT CERVICAL SPINE WITHOUT CONTRAST TECHNIQUE: Multidetector CT imaging of the head and cervical spine was performed following the standard protocol without intravenous contrast. Multiplanar CT image reconstructions of the cervical spine were also generated. COMPARISON:  MRI of the brain 07/27/2021. CT the cervical spine 07/27/2021. Head CT 07/27/2021. FINDINGS: CT HEAD FINDINGS Brain: Moderate cerebral and cerebellar atrophy. Patchy and confluent areas of decreased attenuation are noted throughout the deep and periventricular white matter of the cerebral hemispheres bilaterally, compatible with chronic microvascular ischemic disease. Well-defined areas of low attenuation in the head of the caudate nuclei bilaterally, compatible with old lacunar infarcts. Well-defined area of low attenuation in the left occipital region, compatible with encephalomalacia from remote left PCA territory infarct, similar to the prior examination. Well-defined areas of low attenuation in the cerebellar hemispheres bilaterally, compatible with old lacunar infarcts. No evidence of acute infarction, hemorrhage, hydrocephalus, extra-axial collection or mass lesion/mass effect. Vascular: No hyperdense vessel or unexpected calcification. Skull: Normal. Negative for fracture or focal lesion. Sinuses/Orbits: No acute finding. Other: None. CT CERVICAL SPINE FINDINGS Alignment: Normal. Skull base and vertebrae: No acute fracture. No primary bone lesion or focal pathologic process. Soft tissues and spinal canal: No prevertebral fluid or swelling. No visible canal hematoma. Disc levels: Multilevel degenerative disc disease most evident at C5-C6 and C6-C7. Mild multilevel facet arthropathy. Upper chest: Unremarkable. Other: There are no aggressive appearing lytic  or blastic lesions noted in the visualized portions of the skeleton. IMPRESSION: 1. No evidence of significant acute traumatic injury to the skull, brain  or cervical spine. 2. Moderate cerebral and cerebellar atrophy with extensive chronic microvascular ischemic changes in the cerebral white matter, old lacunar infarcts in the basal ganglia and cerebellar hemispheres bilaterally, and old left PCA territory infarct, similar to prior examinations, as above. 3. Multilevel degenerative disc disease and cervical spondylosis, as above. Electronically Signed   By: Vinnie Langton M.D.   On: 11/18/2021 09:48   CT CERVICAL SPINE WO CONTRAST  Result Date: 11/18/2021 CLINICAL DATA:  87 year old female with history of trauma from a fall at a nursing facility. Head and neck pain. EXAM: CT HEAD WITHOUT CONTRAST CT CERVICAL SPINE WITHOUT CONTRAST TECHNIQUE: Multidetector CT imaging of the head and cervical spine was performed following the standard protocol without intravenous contrast. Multiplanar CT image reconstructions of the cervical spine were also generated. COMPARISON:  MRI of the brain 07/27/2021. CT the cervical spine 07/27/2021. Head CT 07/27/2021. FINDINGS: CT HEAD FINDINGS Brain: Moderate cerebral and cerebellar atrophy. Patchy and confluent areas of decreased attenuation are noted throughout the deep and periventricular white matter of the cerebral hemispheres bilaterally, compatible with chronic microvascular ischemic disease. Well-defined areas of low attenuation in the head of the caudate nuclei bilaterally, compatible with old lacunar infarcts. Well-defined area of low attenuation in the left occipital region, compatible with encephalomalacia from remote left PCA territory infarct, similar to the prior examination. Well-defined areas of low attenuation in the cerebellar hemispheres bilaterally, compatible with old lacunar infarcts. No evidence of acute infarction, hemorrhage, hydrocephalus, extra-axial collection or mass lesion/mass effect. Vascular: No hyperdense vessel or unexpected calcification. Skull: Normal. Negative for fracture or focal lesion.  Sinuses/Orbits: No acute finding. Other: None. CT CERVICAL SPINE FINDINGS Alignment: Normal. Skull base and vertebrae: No acute fracture. No primary bone lesion or focal pathologic process. Soft tissues and spinal canal: No prevertebral fluid or swelling. No visible canal hematoma. Disc levels: Multilevel degenerative disc disease most evident at C5-C6 and C6-C7. Mild multilevel facet arthropathy. Upper chest: Unremarkable. Other: There are no aggressive appearing lytic or blastic lesions noted in the visualized portions of the skeleton. IMPRESSION: 1. No evidence of significant acute traumatic injury to the skull, brain or cervical spine. 2. Moderate cerebral and cerebellar atrophy with extensive chronic microvascular ischemic changes in the cerebral white matter, old lacunar infarcts in the basal ganglia and cerebellar hemispheres bilaterally, and old left PCA territory infarct, similar to prior examinations, as above. 3. Multilevel degenerative disc disease and cervical spondylosis, as above. Electronically Signed   By: Vinnie Langton M.D.   On: 11/18/2021 09:48   CT PELVIS WO CONTRAST  Result Date: 11/18/2021 CLINICAL DATA:  Hip trauma, fracture suspected, xray done EXAM: CT PELVIS WITHOUT CONTRAST TECHNIQUE: Multidetector CT imaging of the pelvis was performed following the standard protocol without intravenous contrast. COMPARISON:  Same day hip radiograph FINDINGS: Bones/Joint/Cartilage There is no evidence of acute fracture. There is moderate bilateral hip osteoarthritis. Prior left proximal femur fixation, with chronic adjacent heterotopic ossification. Moderate degenerative changes of the symphysis pubis. Benign bone island in the right femoral head. Severe lower lumbar spine degenerative disc disease seen at L3-L4, L4-L5, and L5-S1. Bilateral SI joint degenerative change. Ligaments Suboptimally assessed by CT. Muscles and Tendons There is severe gluteus minimus muscle atrophy bilaterally. Soft  tissues No focal fluid collection. In the pelvis, there is extensive sigmoid diverticulosis. The bladder is markedly distended. Aortoiliac  atherosclerotic disease. Prior hysterectomy. There is a hyperdense nodule adjacent to the rectum measuring 1.5 cm, unchanged from prior exam in September 2019 when it also measured 1.5 cm, given stability this favors a benign entity and may be a dense, non gas-filled diverticula. IMPRESSION: No evidence of acute fracture. Moderate bilateral hip osteoarthritis. Prior left proximal femur fixation with chronic adjacent heterotopic ossification. Severe lower lumbar spine degenerative disc disease. Markedly distended urinary bladder. Hyperdense nodule adjacent to the rectum measuring 1.5 cm, unchanged from prior exam in September 2019. Given stability this favors a benign entity such as a complicated tail gut duplication cyst. Indolent neoplasm cannot be excluded. If desired, MRI with and without contrast would be useful for further evaluation. Electronically Signed   By: Maurine Simmering M.D.   On: 11/18/2021 11:53   DG Hip Unilat With Pelvis 2-3 Views Right  Result Date: 11/18/2021 CLINICAL DATA:  Fall, right hip pain EXAM: DG HIP (WITH OR WITHOUT PELVIS) 2-3V RIGHT COMPARISON:  Hip radiographs 04/28/2020 FINDINGS: Postsurgical changes reflecting intramedullary nail fixation of the left femur are noted, incompletely imaged. The imaged hardware is within normal limits, without evidence of complication. There is no perihardware fracture. There is a cortical step-off along the inferior margin of the right superior pubic ramus seen only on the first image. There is no other evidence of fracture. Femoroacetabular alignment is normal bilaterally. There is moderate joint space narrowing bilaterally. The SI joints and symphysis pubis are intact. IMPRESSION: Suspected fracture of the right superior pubic ramus. Recommend CT of the pelvis for further evaluation. Electronically Signed   By:  Valetta Mole M.D.   On: 11/18/2021 10:18    Procedures Procedures  Medications Ordered in the ED Medications - No data to display   MDM Rules/Calculators/A&P MDM Patient with reported fall, per ALF staff she was on the floor of her room this morning. Unwitnessed fall, not near her walker. Per their report the patient was complaining of hip pain but the staff I spoke with is unsure which one. She is usually alert and 'talks your ear off' but sometimes has periods of being very quiet. Will check labs, head CT due to possible mental status change from baseline, xray R hip and monitor in the ED.   ED Course  I have reviewed the triage vital signs and the nursing notes.  Pertinent labs & imaging results that were available during my care of the patient were reviewed by me and considered in my medical decision making (see chart for details).  Clinical Course as of 11/18/21 1444  Thu Nov 18, 2021  1021 CT head and c-spine neg for acute process/injury.  [CS]  9211 CBC with mild anemia, otherwise unremarkable.  [CS]  47 BMP shows CKD about at baseline. Hip xray reviewed, suspected pubic ramus fx, no apparent hip fracture, will send for CT to further evaluate.  [CS]  9417 CT images and results reviewed, no acute fracture, incidental findings noted. Patient has not urinated yet with purewick, will order in-and-out for urine sample and to drain distended bladder.  [CS]  1413 UA is neg for infection. She remains very somnolent. Unable to speak which is not her baseline per SNF report. Will discuss admission with hospitalist for AMS.  [CS]  42 Spoke with Dr. Maurene Capes, Hospitalist, who will evaluate in the ED.  [CS]    Clinical Course User Index [CS] Truddie Hidden, MD    Final Clinical Impression(s) / ED Diagnoses Final diagnoses:  Fall,  initial encounter  Altered mental status, unspecified altered mental status type    Rx / DC Orders ED Discharge Orders     None         Truddie Hidden, MD 11/18/21 1444

## 2021-11-19 DIAGNOSIS — G9341 Metabolic encephalopathy: Secondary | ICD-10-CM | POA: Diagnosis not present

## 2021-11-19 LAB — CBC
HCT: 31.8 % — ABNORMAL LOW (ref 36.0–46.0)
Hemoglobin: 10.1 g/dL — ABNORMAL LOW (ref 12.0–15.0)
MCH: 29.9 pg (ref 26.0–34.0)
MCHC: 31.8 g/dL (ref 30.0–36.0)
MCV: 94.1 fL (ref 80.0–100.0)
Platelets: 250 10*3/uL (ref 150–400)
RBC: 3.38 MIL/uL — ABNORMAL LOW (ref 3.87–5.11)
RDW: 13.3 % (ref 11.5–15.5)
WBC: 5.4 10*3/uL (ref 4.0–10.5)
nRBC: 0 % (ref 0.0–0.2)

## 2021-11-19 LAB — BASIC METABOLIC PANEL
Anion gap: 9 (ref 5–15)
BUN: 32 mg/dL — ABNORMAL HIGH (ref 8–23)
CO2: 22 mmol/L (ref 22–32)
Calcium: 8.9 mg/dL (ref 8.9–10.3)
Chloride: 111 mmol/L (ref 98–111)
Creatinine, Ser: 1.61 mg/dL — ABNORMAL HIGH (ref 0.44–1.00)
GFR, Estimated: 29 mL/min — ABNORMAL LOW (ref >60)
Glucose, Bld: 107 mg/dL — ABNORMAL HIGH (ref 70–99)
Potassium: 3.6 mmol/L (ref 3.5–5.1)
Sodium: 142 mmol/L (ref 135–145)

## 2021-11-19 LAB — GLUCOSE, CAPILLARY: Glucose-Capillary: 96 mg/dL (ref 70–99)

## 2021-11-19 MED ORDER — HYDRALAZINE HCL 50 MG PO TABS: 50.0000 mg | ORAL_TABLET | Freq: Three times a day (TID) | ORAL | 2 refills | Status: DC

## 2021-11-19 MED ORDER — ENSURE ENLIVE PO LIQD: 237.0000 mL | Freq: Two times a day (BID) | ORAL | 12 refills | Status: DC

## 2021-11-19 MED ORDER — AMLODIPINE BESYLATE 10 MG PO TABS: 10.0000 mg | ORAL_TABLET | Freq: Every day | ORAL | 3 refills | Status: DC

## 2021-11-19 MED ORDER — ASPIRIN 81 MG PO TABS: 81.0000 mg | ORAL_TABLET | Freq: Every day | ORAL | 1 refills | Status: DC

## 2021-11-19 MED ORDER — ACETAMINOPHEN 325 MG PO TABS: 650.0000 mg | ORAL_TABLET | Freq: Four times a day (QID) | ORAL | 0 refills | Status: DC | PRN

## 2021-11-19 MED ORDER — POLYETHYLENE GLYCOL 3350 17 G PO PACK: 17.0000 g | PACK | Freq: Every day | ORAL | 2 refills | Status: DC

## 2021-11-19 MED ORDER — LORAZEPAM 0.5 MG PO TABS: 0.5000 mg | ORAL_TABLET | Freq: Every day | ORAL | 0 refills | Status: DC | PRN

## 2021-11-19 NOTE — Discharge Instructions (Signed)
1)Avoid ibuprofen/Advil/Aleve/Motrin/Goody Powders/Naproxen/BC powders/Meloxicam/Diclofenac/Indomethacin and other Nonsteroidal anti-inflammatory medications as these will make you more likely to bleed and can cause stomach ulcers, can also cause Kidney problems.   2)Avoid excessive Lorazepam, Clonazepam  3)Repeat CBC and BMP blood test in about 1 week with Vilinda Boehringer, NP (Primary Care Provider)

## 2021-11-19 NOTE — Progress Notes (Signed)
Report called to [facility]. Discharge instruction given to daughter at bedside. Daughter will transport pt back to [facility].

## 2021-11-19 NOTE — Discharge Summary (Signed)
Nancy Medina, is a 87 y.o. female  DOB November 25, 1924  MRN [id number].  Admission date:  11/18/2021  Admitting Physician  Nancy Hinderer Denton Brick, MD  Discharge Date:  11/19/2021   Primary MD  Nancy Boehringer, NP  Recommendations for primary care physician for things to follow:   1)Avoid ibuprofen/Advil/Aleve/Motrin/Goody Powders/Naproxen/BC powders/Meloxicam/Diclofenac/Indomethacin and other Nonsteroidal anti-inflammatory medications as these will make you more likely to bleed and can cause stomach ulcers, can also cause Kidney problems.   2)Avoid excessive Lorazepam, Clonazepam  3)Repeat CBC and BMP blood test in about 1 week with Nancy Boehringer, NP (Primary Care Provider)   Admission Diagnosis  Fall, initial encounter [phone]] Altered mental status, unspecified altered mental status type [Z56.38] Acute metabolic encephalopathy [V56.43]   Discharge Diagnosis  Fall, initial encounter [W19.XXXA] Altered mental status, unspecified altered mental status type [P29.51] Acute metabolic encephalopathy [O84.16]    Principal Problem:   Acute metabolic encephalopathy Active Problems:   Essential hypertension   Anemia   Chronic kidney disease, stage 4 (severe) (HCC)   Type 2 diabetes mellitus with other specified complication (HCC)      Past Medical History:  Diagnosis Date   Achilles tendinitis    Allergic rhinitis    Anemia of chronic disease    at least back to 2010   Anxiety    Benign paroxysmal positional vertigo    Cataract    Cerebrovascular accident [facility])    Chronic kidney disease (CKD), stage III (moderate) (HCC)    Degenerative disc disease, thoracic    Depression    Diverticulitis    DJD (degenerative joint disease)    DM (diabetes mellitus) ([place])    type II   GERD (gastroesophageal reflux disease)    HTN (hypertension)    HX: breast cancer    Hyperlipidemia    Hypertonicity of  bladder    Knee pain, left    Leg pain    Lymphocytic colitis 2008   treated with Lialda short-term   Memory loss    Obesity    Osteoporosis, unspecified    Renal disease    stage III   Unspecified fall    Unspecified glaucoma(365.9)     Past Surgical History:  Procedure Laterality Date   Bladder tack  2000   BREAST LUMPECTOMY  2004   right   cataracts     CHOLECYSTECTOMY  1960   COLONOSCOPY  07/2007   lymphocytic colitis   ESOPHAGOGASTRODUODENOSCOPY  06/24/2011   Procedure: ESOPHAGOGASTRODUODENOSCOPY (EGD);  Surgeon: Dorothyann Peng, MD;  Location: [facility];  Service: Endoscopy;  Laterality: N/A;.  Peptic stricture, mild erosive reflux esophagitis, hiatal   FEMUR IM NAIL Left 04/28/2020   Procedure: INTRAMEDULLARY (IM) NAIL FEMORAL;  Surgeon: Marchia Bond, MD;  Location: [facility];  Service: Orthopedics;  Laterality: Left;   FLEXIBLE SIGMOIDOSCOPY  11/25/2011   MODERATE DIVERTICULOSIS/INTERNAL HEMORRHOIDS   S/P Hysterectomy  2000   benign reasons   SAVORY DILATION  06/24/2011   Procedure: SAVORY DILATION;  Surgeon: Dorothyann Peng, MD;  Location:  [facility];  Service: Endoscopy;  Laterality: N/A;     HPI  from the history and physical done on the day of admission:     Nancy Medina  is a 87 y.o. female with past medical history relevant for stage II hypertension, DM, dementia, HLD, CKD 4, presents from long-term care facility after apparently falling while being lethargic -- In the ED UA is not consistent with UTI -BMP with creatinine of 2 and GFR of 23 which is not far from patient's prior baseline -WBC 7.3 hemoglobin 10.3 -History obtained from patient's daughter Nancy Medina at bedside- No fever  , no vomiting or diarrhea apparently -Apparently oral intake has not been great -Concerned that she may have been given sedatives and now appears to be sleeping longer than expected -CT head, CT C-spine, CT hip and pelvic imaging studies without acute findings - -EDP requested  admission for observation due to persistent lethargy     Hospital Course:    1) acute metabolic encephalopathy/AMS--suspect medication and dehydration related -PTA patient was taking Remeron, methocarbamol clonazepam and lorazepam -Suspect prolonged effect of sedatives in the setting of worsening renal function and dehydration--with GFR of 23 - stopped all except for Remeron 7.5 mg nightly -Work-up without evidence of acute neuro findings on imaging studies, no significant evidence for acute infection at this time -No history of seizures -Patient is now awake, talkative, eating and drinking okay, participated in physical therapy session,  As per Daughter appears back to baseline from a mentation standpoint   2) social/ethics----plan of care and advanced directive discussed with patient and patient's daughter Nancy Medina at bedside patient is a DNR/DNI   3)CKD stage -IV -Creatinine is down to 1.61 from 2.0 on admission -Baseline creatinine usually between 1.6 and 1.8 -Overall renal function improved with hydration -Repeat BMP in about a week or so with PCP advised   4)HTN--stage 2, BP currently not at goal, amlodipine and hydralazine adjusted   5)DM2-recent A1c 6.1  -Stop glimepiride to avoid hypoglycemia given unreliable/inconsistent oral intake from time to time -Okay to continue Januvia   6)S/p Fall--- CT head, CT C-spine, CT hip and pelvic imaging studies without acute findings -Patient apparently was lethargic which may have contributed to her fall -Patient is now awake and back to baseline from a mentation standpoint -Physical therapy eval appreciated  -PT recommends home health PT   7)HLD--- patient is 87 years old , risk versus benefits of statins discussed, decision to stop statins   8) anemia of CKD--- hemoglobin 10.1, overall stable  -Repeat CBC with PCP in about a week or so  Disposition--[facility] Dispo: The patient is from: ALF              Anticipated d/c is  to: ALF  Discharge Condition: stable  Follow UP   Follow-up Information     Nancy Boehringer, NP Follow up in 1 week(s).   Why: Repeat CBC and BMP Blood Test Contact information: [place] [address] [phone]                Diet and Activity recommendation:  As advised  Discharge Instructions    Discharge Instructions     Call MD for:  difficulty breathing, headache or visual disturbances   Complete by: As directed    Call MD for:  persistant dizziness or light-headedness   Complete by: As directed    Call MD for:  persistant nausea and vomiting   Complete by: As directed  Call MD for:  severe uncontrolled pain   Complete by: As directed    Call MD for:  temperature >100.4   Complete by: As directed    Diet - low sodium heart healthy   Complete by: As directed    Discharge instructions   Complete by: As directed    1)Avoid ibuprofen/Advil/Aleve/Motrin/Goody Powders/Naproxen/BC powders/Meloxicam/Diclofenac/Indomethacin and other Nonsteroidal anti-inflammatory medications as these will make you more likely to bleed and can cause stomach ulcers, can also cause Kidney problems.   2)Avoid excessive Lorazepam, Clonazepam  3)Repeat CBC and BMP blood test in about 1 week with Nancy Boehringer, NP (Primary Care Provider)   Increase activity slowly   Complete by: As directed          Discharge Medications     Allergies as of 11/19/2021       Reactions   Metformin    This allergy is not listed under records with [facility] facility. Past records states that patient "felt poor" as a reaction to taking this medication        Medication List     STOP taking these medications    acetaminophen 650 MG CR tablet Commonly known as: TYLENOL Replaced by: acetaminophen 325 MG tablet   clonazePAM 0.5 MG tablet Commonly known as: KLONOPIN   glimepiride 1 MG tablet Commonly known as: AMARYL   methocarbamol 500 MG tablet Commonly  known as: ROBAXIN       TAKE these medications    acetaminophen 325 MG tablet Commonly known as: TYLENOL Take 2 tablets (650 mg total) by mouth every 6 (six) hours as needed for mild pain (or Fever >/= 101). Replaces: acetaminophen 650 MG CR tablet   amLODipine 10 MG tablet Commonly known as: NORVASC Take 1 tablet (10 mg total) by mouth daily. What changed: Another medication with the same name was removed. Continue taking this medication, and follow the directions you see here.   aspirin 81 MG tablet Take 1 tablet (81 mg total) by mouth daily with breakfast. What changed: when to take this   brimonidine 0.2 % ophthalmic solution Commonly known as: ALPHAGAN Place 1 drop into both eyes every 8 (eight) hours.   cycloSPORINE 0.05 % ophthalmic emulsion Commonly known as: RESTASIS Place 1 drop into both eyes 2 (two) times daily.   dorzolamide-timolol 22.3-6.8 MG/ML ophthalmic solution Commonly known as: COSOPT Place 1 drop into both eyes in the morning and at bedtime.   famotidine 20 MG tablet Commonly known as: PEPCID Take 20 mg by mouth daily.   feeding supplement Liqd Take 237 mLs by mouth 2 (two) times daily between meals.   furosemide 20 MG tablet Commonly known as: LASIX Take 20 mg by mouth daily.   hydrALAZINE 50 MG tablet Commonly known as: APRESOLINE Take 1 tablet (50 mg total) by mouth 3 (three) times daily. What changed:  medication strength how much to take   loperamide 2 MG tablet Commonly known as: IMODIUM A-D Take 2 mg by mouth 4 (four) times daily as needed for diarrhea or loose stools.   LORazepam 0.5 MG tablet Commonly known as: ATIVAN Take 1 tablet (0.5 mg total) by mouth daily as needed for anxiety.   mirtazapine 7.5 MG tablet Commonly known as: REMERON Take 7.5 mg by mouth at bedtime.   NON FORMULARY Diet: _____ Regular, ______ NAS, ___x____Consistent Carbohydrate, _______NPO _____Other   polyethylene glycol 17 g packet Commonly  known as: MIRALAX / GLYCOLAX Take 17 g by mouth daily.   Rocklatan  0.02-0.005 % Soln Generic drug: Netarsudil-Latanoprost Apply 1 drop to eye at bedtime.   sitaGLIPtin 25 MG tablet Commonly known as: Januvia Take 1 tablet (25 mg total) by mouth daily.   vitamin B-12 1000 MCG tablet Commonly known as: CYANOCOBALAMIN Take 1 tablet (1,000 mcg total) by mouth daily.   Vitamin D 50 MCG (2000 UT) Caps Take 1 capsule by mouth daily.        Major procedures and Radiology Reports - PLEASE review detailed and final reports for all details, in brief -  CT HEAD WO CONTRAST  Result Date: 11/18/2021 CLINICAL DATA:  87 year old female with history of trauma from a fall at a nursing facility. Head and neck pain. EXAM: CT HEAD WITHOUT CONTRAST CT CERVICAL SPINE WITHOUT CONTRAST TECHNIQUE: Multidetector CT imaging of the head and cervical spine was performed following the standard protocol without intravenous contrast. Multiplanar CT image reconstructions of the cervical spine were also generated. COMPARISON:  MRI of the brain 07/27/2021. CT the cervical spine 07/27/2021. Head CT 07/27/2021. FINDINGS: CT HEAD FINDINGS Brain: Moderate cerebral and cerebellar atrophy. Patchy and confluent areas of decreased attenuation are noted throughout the deep and periventricular white matter of the cerebral hemispheres bilaterally, compatible with chronic microvascular ischemic disease. Well-defined areas of low attenuation in the head of the caudate nuclei bilaterally, compatible with old lacunar infarcts. Well-defined area of low attenuation in the left occipital region, compatible with encephalomalacia from remote left PCA territory infarct, similar to the prior examination. Well-defined areas of low attenuation in the cerebellar hemispheres bilaterally, compatible with old lacunar infarcts. No evidence of acute infarction, hemorrhage, hydrocephalus, extra-axial collection or mass lesion/mass effect. Vascular: No  hyperdense vessel or unexpected calcification. Skull: Normal. Negative for fracture or focal lesion. Sinuses/Orbits: No acute finding. Other: None. CT CERVICAL SPINE FINDINGS Alignment: Normal. Skull base and vertebrae: No acute fracture. No primary bone lesion or focal pathologic process. Soft tissues and spinal canal: No prevertebral fluid or swelling. No visible canal hematoma. Disc levels: Multilevel degenerative disc disease most evident at C5-C6 and C6-C7. Mild multilevel facet arthropathy. Upper chest: Unremarkable. Other: There are no aggressive appearing lytic or blastic lesions noted in the visualized portions of the skeleton. IMPRESSION: 1. No evidence of significant acute traumatic injury to the skull, brain or cervical spine. 2. Moderate cerebral and cerebellar atrophy with extensive chronic microvascular ischemic changes in the cerebral white matter, old lacunar infarcts in the basal ganglia and cerebellar hemispheres bilaterally, and old left PCA territory infarct, similar to prior examinations, as above. 3. Multilevel degenerative disc disease and cervical spondylosis, as above. Electronically Signed   By: Vinnie Langton M.D.   On: 11/18/2021 09:48   CT CERVICAL SPINE WO CONTRAST  Result Date: 11/18/2021 CLINICAL DATA:  87 year old female with history of trauma from a fall at a nursing facility. Head and neck pain. EXAM: CT HEAD WITHOUT CONTRAST CT CERVICAL SPINE WITHOUT CONTRAST TECHNIQUE: Multidetector CT imaging of the head and cervical spine was performed following the standard protocol without intravenous contrast. Multiplanar CT image reconstructions of the cervical spine were also generated. COMPARISON:  MRI of the brain 07/27/2021. CT the cervical spine 07/27/2021. Head CT 07/27/2021. FINDINGS: CT HEAD FINDINGS Brain: Moderate cerebral and cerebellar atrophy. Patchy and confluent areas of decreased attenuation are noted throughout the deep and periventricular white matter of the  cerebral hemispheres bilaterally, compatible with chronic microvascular ischemic disease. Well-defined areas of low attenuation in the head of the caudate nuclei bilaterally, compatible with old lacunar infarcts. Well-defined  area of low attenuation in the left occipital region, compatible with encephalomalacia from remote left PCA territory infarct, similar to the prior examination. Well-defined areas of low attenuation in the cerebellar hemispheres bilaterally, compatible with old lacunar infarcts. No evidence of acute infarction, hemorrhage, hydrocephalus, extra-axial collection or mass lesion/mass effect. Vascular: No hyperdense vessel or unexpected calcification. Skull: Normal. Negative for fracture or focal lesion. Sinuses/Orbits: No acute finding. Other: None. CT CERVICAL SPINE FINDINGS Alignment: Normal. Skull base and vertebrae: No acute fracture. No primary bone lesion or focal pathologic process. Soft tissues and spinal canal: No prevertebral fluid or swelling. No visible canal hematoma. Disc levels: Multilevel degenerative disc disease most evident at C5-C6 and C6-C7. Mild multilevel facet arthropathy. Upper chest: Unremarkable. Other: There are no aggressive appearing lytic or blastic lesions noted in the visualized portions of the skeleton. IMPRESSION: 1. No evidence of significant acute traumatic injury to the skull, brain or cervical spine. 2. Moderate cerebral and cerebellar atrophy with extensive chronic microvascular ischemic changes in the cerebral white matter, old lacunar infarcts in the basal ganglia and cerebellar hemispheres bilaterally, and old left PCA territory infarct, similar to prior examinations, as above. 3. Multilevel degenerative disc disease and cervical spondylosis, as above. Electronically Signed   By: Vinnie Langton M.D.   On: 11/18/2021 09:48   CT PELVIS WO CONTRAST  Result Date: 11/18/2021 CLINICAL DATA:  Hip trauma, fracture suspected, xray done EXAM: CT PELVIS  WITHOUT CONTRAST TECHNIQUE: Multidetector CT imaging of the pelvis was performed following the standard protocol without intravenous contrast. COMPARISON:  Same day hip radiograph FINDINGS: Bones/Joint/Cartilage There is no evidence of acute fracture. There is moderate bilateral hip osteoarthritis. Prior left proximal femur fixation, with chronic adjacent heterotopic ossification. Moderate degenerative changes of the symphysis pubis. Benign bone island in the right femoral head. Severe lower lumbar spine degenerative disc disease seen at L3-L4, L4-L5, and L5-S1. Bilateral SI joint degenerative change. Ligaments Suboptimally assessed by CT. Muscles and Tendons There is severe gluteus minimus muscle atrophy bilaterally. Soft tissues No focal fluid collection. In the pelvis, there is extensive sigmoid diverticulosis. The bladder is markedly distended. Aortoiliac atherosclerotic disease. Prior hysterectomy. There is a hyperdense nodule adjacent to the rectum measuring 1.5 cm, unchanged from prior exam in September 2019 when it also measured 1.5 cm, given stability this favors a benign entity and may be a dense, non gas-filled diverticula. IMPRESSION: No evidence of acute fracture. Moderate bilateral hip osteoarthritis. Prior left proximal femur fixation with chronic adjacent heterotopic ossification. Severe lower lumbar spine degenerative disc disease. Markedly distended urinary bladder. Hyperdense nodule adjacent to the rectum measuring 1.5 cm, unchanged from prior exam in September 2019. Given stability this favors a benign entity such as a complicated tail gut duplication cyst. Indolent neoplasm cannot be excluded. If desired, MRI with and without contrast would be useful for further evaluation. Electronically Signed   By: Maurine Simmering M.D.   On: 11/18/2021 11:53   DG Hip Unilat With Pelvis 2-3 Views Right  Result Date: 11/18/2021 CLINICAL DATA:  Fall, right hip pain EXAM: DG HIP (WITH OR WITHOUT PELVIS) 2-3V  RIGHT COMPARISON:  Hip radiographs 04/28/2020 FINDINGS: Postsurgical changes reflecting intramedullary nail fixation of the left femur are noted, incompletely imaged. The imaged hardware is within normal limits, without evidence of complication. There is no perihardware fracture. There is a cortical step-off along the inferior margin of the right superior pubic ramus seen only on the first image. There is no other evidence of fracture. Femoroacetabular alignment  is normal bilaterally. There is moderate joint space narrowing bilaterally. The SI joints and symphysis pubis are intact. IMPRESSION: Suspected fracture of the right superior pubic ramus. Recommend CT of the pelvis for further evaluation. Electronically Signed   By: Valetta Mole M.D.   On: 11/18/2021 10:18    Micro Results   Recent Results (from the past 240 hour(s))  Resp Panel by RT-PCR (Flu A&B, Covid) Nasopharyngeal Swab     Status: None   Collection Time: 11/18/21  2:54 PM   Specimen: Nasopharyngeal Swab; Nasopharyngeal(NP) swabs in vial transport medium  Result Value Ref Range Status   SARS Coronavirus 2 by RT PCR NEGATIVE NEGATIVE Final    Comment: (NOTE) SARS-CoV-2 target nucleic acids are NOT DETECTED.  The SARS-CoV-2 RNA is generally detectable in upper respiratory specimens during the acute phase of infection. The lowest concentration of SARS-CoV-2 viral copies this assay can detect is 138 copies/mL. A negative result does not preclude SARS-Cov-2 infection and should not be used as the sole basis for treatment or other patient management decisions. A negative result may occur with  improper specimen collection/handling, submission of specimen other than nasopharyngeal swab, presence of viral mutation(s) within the areas targeted by this assay, and inadequate number of viral copies(<138 copies/mL). A negative result must be combined with clinical observations, patient history, and epidemiological information. The expected  result is Negative.  Fact Sheet for Patients:  EntrepreneurPulse.com.au  Fact Sheet for Healthcare Providers:  IncredibleEmployment.be  This test is no t yet approved or cleared by the [place] FDA and  has been authorized for detection and/or diagnosis of SARS-CoV-2 by FDA under an Emergency Use Authorization (EUA). This EUA will remain  in effect (meaning this test can be used) for the duration of the COVID-19 declaration under Section 564(b)(1) of the Act, 21 U.S.C.section 360bbb-3(b)(1), unless the authorization is terminated  or revoked sooner.       Influenza A by PCR NEGATIVE NEGATIVE Final   Influenza B by PCR NEGATIVE NEGATIVE Final    Comment: (NOTE) The Xpert Xpress SARS-CoV-2/FLU/RSV plus assay is intended as an aid in the diagnosis of influenza from Nasopharyngeal swab specimens and should not be used as a sole basis for treatment. Nasal washings and aspirates are unacceptable for Xpert Xpress SARS-CoV-2/FLU/RSV testing.  Fact Sheet for Patients: EntrepreneurPulse.com.au  Fact Sheet for Healthcare Providers: IncredibleEmployment.be  This test is not yet approved or cleared by the [place] FDA and has been authorized for detection and/or diagnosis of SARS-CoV-2 by FDA under an Emergency Use Authorization (EUA). This EUA will remain in effect (meaning this test can be used) for the duration of the COVID-19 declaration under Section 564(b)(1) of the Act, 21 U.S.C. section 360bbb-3(b)(1), unless the authorization is terminated or revoked.  Performed at [facility], [address], [place], [place] [address]        Today   Subjective    Nancy Medina today has no new complaints         - -Patient is now awake, talkative, eating and drinking okay, participated in physical therapy session,  As per Daughter appears back to baseline from a mentation standpoint   Patient has  been seen and examined prior to discharge   Objective   Blood pressure (!) 149/65, pulse 70, temperature 97.6 F (36.4 C), temperature source Oral, resp. rate 18, height 5\' 6"  (1.676 m), weight 68 kg, SpO2 96 %.   Intake/Output Summary (Last 24 hours) at 11/19/2021 1312 Last data filed  at 11/19/2021 0900 Gross per 24 hour  Intake 982.89 ml  Output --  Net 982.89 ml    Exam Gen:- Awake Alert, no acute distress  HEENT:- Ada.AT, No sclera icterus Ears:- HOH Neck-Supple Neck,No JVD,.  Lungs-  CTAB , good air movement bilaterally  CV- S1, S2 normal, regular Abd-  +ve B.Sounds, Abd Soft, No tenderness,    Extremity/Skin:- No  edema,   good pulses Psych-affect is appropriate, oriented x3, underlying cognitive and memory deficits Neuro-generalized weakness, no new focal deficits, no tremors    Data Review   CBC w Diff:  Lab Results  Component Value Date   WBC 5.4 11/19/2021   HGB 10.1 (L) 11/19/2021   HCT 31.8 (L) 11/19/2021   HCT 35 06/01/2011   PLT 250 11/19/2021   LYMPHOPCT 23 11/18/2021   MONOPCT 8 11/18/2021   EOSPCT 6 11/18/2021   BASOPCT 0 11/18/2021    CMP:  Lab Results  Component Value Date   NA 142 11/19/2021   NA 140 06/01/2011   K 3.6 11/19/2021   K 4.7 06/01/2011   CL 111 11/19/2021   CO2 22 11/19/2021   BUN 32 (H) 11/19/2021   BUN 28 (A) 06/01/2011   CREATININE 1.61 (H) 11/19/2021   CREATININE 1.36 06/01/2011   GLU 125 07/09/2016   PROT 6.5 07/27/2021   ALBUMIN 3.8 07/27/2021   BILITOT 0.7 07/27/2021   ALKPHOS 51 07/27/2021   AST 13 (L) 07/27/2021   ALT 10 07/27/2021  .   Total Discharge time is about 33 minutes  Roxan Hockey M.D on 11/19/2021 at 1:12 PM  Go to [email or web address] -  for contact info  [facility] - Office  [phone]

## 2021-11-19 NOTE — Evaluation (Signed)
Physical Therapy Evaluation Patient Details Name: Nancy Medina MRN: [id number] DOB: [birth year] Today's Date: 11/19/2021  History of Present Illness  Nancy Medina  is a 87 y.o. female with past medical history relevant for stage II hypertension, DM, dementia, HLD, CKD 4, presents from long-term care facility after apparently falling while being lethargic   Clinical Impression  Patient does not require assist with bed mobility and demonstrates good sitting balance EOB. Patient transfers to standing with RW and requires assist to fully upright trunk. She is able to ambulate without loss of balance with use of RW and returns to bed at end of session. Patient will benefit from continued skilled physical therapy in hospital and recommended venue below to increase strength, balance, endurance for safe ADLs and gait.        Recommendations for follow up therapy are one component of a multi-disciplinary discharge planning process, led by the attending physician.  Recommendations may be updated based on patient status, additional functional criteria and insurance authorization.  Follow Up Recommendations Home health PT    Assistance Recommended at Discharge Intermittent Supervision/Assistance  Functional Status Assessment Patient has had a recent decline in their functional status and demonstrates the ability to make significant improvements in function in a reasonable and predictable amount of time.  Equipment Recommendations  None recommended by PT    Recommendations for Other Services       Precautions / Restrictions Precautions Precautions: Fall Restrictions Weight Bearing Restrictions: No      Mobility  Bed Mobility Overal bed mobility: Modified Independent             General bed mobility comments: slow, labored    Transfers Overall transfer level: Needs assistance Equipment used: Rolling walker (2 wheels) Transfers: Sit to/from Stand Sit to Stand: Min guard;Min  assist           General transfer comment: min assist to fully upright trunk with use of RW    Ambulation/Gait Ambulation/Gait assistance: Min guard Gait Distance (Feet): 40 Feet Assistive device: Rolling walker (2 wheels) Gait Pattern/deviations: Step-through pattern;Decreased stride length Gait velocity: decreased     General Gait Details: slow, labored cadence  Stairs            Wheelchair Mobility    Modified Rankin (Stroke Patients Only)       Balance Overall balance assessment: Needs assistance Sitting-balance support: Feet supported Sitting balance-Leahy Scale: Good Sitting balance - Comments: seated EOB   Standing balance support: Bilateral upper extremity supported Standing balance-Leahy Scale: Good Standing balance comment: good/fair with RW                             Pertinent Vitals/Pain Pain Assessment: No/denies pain    Home Living Family/patient expects to be discharged to:: Assisted living                 Home Equipment: Conservation officer, nature (2 wheels)      Prior Function Prior Level of Function : Needs assist             Mobility Comments: household ambulator with RW ADLs Comments: staff assists PRN     Hand Dominance        Extremity/Trunk Assessment   Upper Extremity Assessment Upper Extremity Assessment: Overall WFL for tasks assessed    Lower Extremity Assessment Lower Extremity Assessment: Generalized weakness    Cervical / Trunk Assessment Cervical / Trunk Assessment: Kyphotic  Communication  Communication: No difficulties  Cognition Arousal/Alertness: Awake/alert Behavior During Therapy: WFL for tasks assessed/performed Overall Cognitive Status: No family/caregiver present to determine baseline cognitive functioning                                          General Comments      Exercises     Assessment/Plan    PT Assessment Patient needs continued PT services  PT  Problem List Decreased strength;Decreased activity tolerance;Decreased balance;Decreased mobility       PT Treatment Interventions DME instruction;Balance training;Gait training;Neuromuscular re-education;Stair training;Functional mobility training;Patient/family education;Therapeutic activities;Therapeutic exercise    PT Goals (Current goals can be found in the Care Plan section)  Acute Rehab PT Goals Patient Stated Goal: return home PT Goal Formulation: With patient Time For Goal Achievement: 12/03/21 Potential to Achieve Goals: Good    Frequency Min 3X/week   Barriers to discharge        Co-evaluation               AM-PAC PT "6 Clicks" Mobility  Outcome Measure Help needed turning from your back to your side while in a flat bed without using bedrails?: None Help needed moving from lying on your back to sitting on the side of a flat bed without using bedrails?: None Help needed moving to and from a bed to a chair (including a wheelchair)?: A Little Help needed standing up from a chair using your arms (e.g., wheelchair or bedside chair)?: A Little Help needed to walk in hospital room?: A Little Help needed climbing 3-5 steps with a railing? : A Lot 6 Click Score: 19    End of Session Equipment Utilized During Treatment: Gait belt Activity Tolerance: Patient tolerated treatment well Patient left: in bed;with bed alarm set;with call bell/phone within reach Nurse Communication: Mobility status PT Visit Diagnosis: Unsteadiness on feet (R26.81);Other abnormalities of gait and mobility (R26.89);History of falling (Z91.81)    Time: 3557-3220 PT Time Calculation (min) (ACUTE ONLY): 10 min   Charges:   PT Evaluation $PT Eval Low Complexity: 1 Low          11:32 AM, 11/19/21 Mearl Latin PT, DPT Physical Therapist at [facility]

## 2021-11-19 NOTE — NC FL2 (Signed)
[place] LEVEL OF CARE SCREENING TOOL     IDENTIFICATION  Patient Name: Nancy Medina Birthdate: 31-Aug-1925 Sex: female Admission Date (Current Location): 11/18/2021  [facility] and [place] Number:  [employer] and Address:  [facility] [address] [place]      Provider Number: [id number]  Attending Physician Name and Address:  Roxan Hockey, MD  Relative Name and Phone Number:  Mickie Kay (Daughter)   [phone]    Current Level of Care: Hospital (observation) Recommended Level of Care: Assisted Living Facility Prior Approval Number:    Date Approved/Denied:   PASRR Number:    Discharge Plan: Domiciliary (Rest home)    Current Diagnoses: Patient Active Problem List   Diagnosis Date Noted   Acute metabolic encephalopathy 77/82/4235   Metabolic encephalopathy 36/14/4315   Altered mental state 07/27/2021   Altered mental status 07/27/2021   Hypertension associated with stage 4 chronic kidney disease due to type 2 diabetes mellitus ([place]) 05/01/2020   Hyperlipidemia associated with type 2 diabetes mellitus ([place]) 05/01/2020   CKD stage 4 due to type 2 diabetes mellitus ([place]) 05/01/2020   Acute blood loss anemia 05/01/2020   Bilateral lower extremity edema 05/01/2020   Increased intraocular pressure, bilateral 05/01/2020   Chronic constipation 05/01/2020   Localized edema 05/01/2020   Displaced intertrochanteric fracture of left femur, subsequent encounter for closed fracture with routine healing 04/27/2020   Abnormal gait 09/13/2018   Mild cognitive impairment 09/13/2018   Uncontrolled hypertension 07/06/2018   Fall 07/04/2018   Scalp laceration 07/04/2018   Acute bronchitis due to Rhinovirus 03/20/2018   Diabetic hyperosmolar non-ketotic state ([place]) 03/18/2018   Acute respiratory failure with hypoxia ([place]) 03/18/2018   CKD (chronic kidney disease) stage 4, GFR 15-29 ml/min (HCC) 03/18/2018   Chronic kidney  disease, stage 4 (severe) ([place]) 03/18/2018   Type 2 diabetes mellitus with hyperosmolarity without nonketotic hyperglycemic-hyperosmolar coma [facility]) ([place]) 03/18/2018   Acute lower UTI 02/09/2018   Nausea 02/09/2018   Constipation 02/09/2018   Diarrhea 11/25/2011   Lymphocytic colitis 06/15/2011   Esophageal dysphagia 06/15/2011   UNSPECIFIED ANEMIA 04/27/2009   Anemia 04/27/2009   ACHILLES TENDINITIS 10/27/2008   PERIPHERAL EDEMA 10/14/2008   Peripheral edema 10/14/2008   DEGENERATIVE DISC DISEASE 01/15/2008   Type 2 diabetes mellitus with hyperlipidemia ([place]) 12/13/2007   Type 2 diabetes mellitus with other specified complication ([place]) 40/06/6760   Hyperlipidemia 10/11/2006   Obesity 10/11/2006   Anxiety 10/11/2006   Depression 10/11/2006   Unspecified glaucoma 10/11/2006   BENIGN POSITIONAL VERTIGO 10/11/2006   Essential hypertension 10/11/2006   GERD 10/11/2006   Overactive bladder 10/11/2006   Osteoporosis 10/11/2006   BREAST CANCER, HX OF 10/11/2006   CEREBROVASCULAR ACCIDENT, HX OF 10/11/2006   Depressive disorder 10/11/2006    Orientation RESPIRATION BLADDER Height & Weight     Self, Time, Situation, Place  Normal Continent Weight: 150 lb (68 kg) Height:  5\' 6"  (167.6 cm)  BEHAVIORAL SYMPTOMS/MOOD NEUROLOGICAL BOWEL NUTRITION STATUS      Continent  (low sodium heart health)  AMBULATORY STATUS COMMUNICATION OF NEEDS Skin   Limited Assist Verbally Normal                       Personal Care Assistance Level of Assistance  Bathing, Feeding, Dressing Bathing Assistance: Limited assistance Feeding assistance: Independent Dressing Assistance: Limited assistance     Functional Limitations Info  Sight, Hearing, Speech Sight Info: Adequate Hearing Info: Adequate  Speech Info: Adequate    SPECIAL CARE FACTORS FREQUENCY  PT (By licensed PT)     PT Frequency: 3x/week              Contractures Contractures Info: Not present    Additional Factors Info   Code Status, Allergies Code Status Info: DNR Allergies Info: Metformin           Current Medications (11/19/2021):  This is the current hospital active medication list Current Facility-Administered Medications  Medication Dose Route Frequency Provider Last Rate Last Admin   0.9 %  sodium chloride infusion  250 mL Intravenous PRN Emokpae, Courage, MD       acetaminophen (TYLENOL) tablet 650 mg  650 mg Oral Q6H PRN Emokpae, Courage, MD       Or   acetaminophen (TYLENOL) suppository 650 mg  650 mg Rectal Q6H PRN Emokpae, Courage, MD       amLODipine (NORVASC) tablet 2.5 mg  2.5 mg Oral Daily Emokpae, Courage, MD   2.5 mg at 11/19/21 0855   aspirin EC tablet 81 mg  81 mg Oral Daily Emokpae, Courage, MD   81 mg at 11/19/21 0853   bisacodyl (DULCOLAX) suppository 10 mg  10 mg Rectal Daily PRN Emokpae, Courage, MD       brimonidine (ALPHAGAN) 0.2 % ophthalmic solution 1 drop  1 drop Both Eyes Q8H Emokpae, Courage, MD   1 drop at 11/19/21 0541   cholecalciferol (VITAMIN D3) tablet 2,000 Units  2,000 Units Oral Daily Denton Brick, Courage, MD   2,000 Units at 11/19/21 0854   cycloSPORINE (RESTASIS) 0.05 % ophthalmic emulsion 1 drop  1 drop Both Eyes BID Emokpae, Courage, MD   1 drop at 11/19/21 0900   dorzolamide-timolol (COSOPT) 22.3-6.8 MG/ML ophthalmic solution 1 drop  1 drop Both Eyes BID Emokpae, Courage, MD   1 drop at 11/19/21 0900   famotidine (PEPCID) tablet 20 mg  20 mg Oral Daily Emokpae, Courage, MD   20 mg at 11/19/21 0853   feeding supplement (ENSURE ENLIVE / ENSURE PLUS) liquid 237 mL  237 mL Oral BID BM Emokpae, Courage, MD   237 mL at 11/19/21 0900   heparin injection 5,000 Units  5,000 Units Subcutaneous Q8H Emokpae, Courage, MD   5,000 Units at 11/19/21 0540   hydrALAZINE (APRESOLINE) tablet 25 mg  25 mg Oral TID Roxan Hockey, MD   25 mg at 11/19/21 0855   insulin aspart (novoLOG) injection 0-5 Units  0-5 Units Subcutaneous QHS Emokpae, Courage, MD       insulin aspart (novoLOG)  injection 0-6 Units  0-6 Units Subcutaneous TID WC Emokpae, Courage, MD       mirtazapine (REMERON) tablet 7.5 mg  7.5 mg Oral QHS Emokpae, Courage, MD   7.5 mg at 11/18/21 2256   Netarsudil-Latanoprost 0.02-0.005 % SOLN 1 drop  1 drop Ophthalmic QHS Emokpae, Courage, MD       ondansetron (ZOFRAN) tablet 4 mg  4 mg Oral Q6H PRN Emokpae, Courage, MD       Or   ondansetron (ZOFRAN) injection 4 mg  4 mg Intravenous Q6H PRN Emokpae, Courage, MD       polyethylene glycol (MIRALAX / GLYCOLAX) packet 17 g  17 g Oral Daily PRN Emokpae, Courage, MD       sodium chloride flush (NS) 0.9 % injection 3 mL  3 mL Intravenous Q12H Emokpae, Courage, MD       sodium chloride flush (NS) 0.9 % injection 3 mL  3  mL Intravenous Q12H Emokpae, Courage, MD       sodium chloride flush (NS) 0.9 % injection 3 mL  3 mL Intravenous PRN Emokpae, Courage, MD       vitamin B-12 (CYANOCOBALAMIN) tablet 1,000 mcg  1,000 mcg Oral Daily Denton Brick, Courage, MD   1,000 mcg at 11/19/21 0901     Discharge Medications: TAKE these medications     acetaminophen 325 MG tablet Commonly known as: TYLENOL Take 2 tablets (650 mg total) by mouth every 6 (six) hours as needed for mild pain (or Fever >/= 101). Replaces: acetaminophen 650 MG CR tablet    amLODipine 10 MG tablet Commonly known as: NORVASC Take 1 tablet (10 mg total) by mouth daily. What changed: Another medication with the same name was removed. Continue taking this medication, and follow the directions you see here.    aspirin 81 MG tablet Take 1 tablet (81 mg total) by mouth daily with breakfast. What changed: when to take this    brimonidine 0.2 % ophthalmic solution Commonly known as: ALPHAGAN Place 1 drop into both eyes every 8 (eight) hours.    cycloSPORINE 0.05 % ophthalmic emulsion Commonly known as: RESTASIS Place 1 drop into both eyes 2 (two) times daily.    dorzolamide-timolol 22.3-6.8 MG/ML ophthalmic solution Commonly known as: COSOPT Place 1 drop  into both eyes in the morning and at bedtime.    famotidine 20 MG tablet Commonly known as: PEPCID Take 20 mg by mouth daily.    feeding supplement Liqd Take 237 mLs by mouth 2 (two) times daily between meals.    furosemide 20 MG tablet Commonly known as: LASIX Take 20 mg by mouth daily.    hydrALAZINE 50 MG tablet Commonly known as: APRESOLINE Take 1 tablet (50 mg total) by mouth 3 (three) times daily. What changed:  medication strength how much to take    loperamide 2 MG tablet Commonly known as: IMODIUM A-D Take 2 mg by mouth 4 (four) times daily as needed for diarrhea or loose stools.    LORazepam 0.5 MG tablet Commonly known as: ATIVAN Take 1 tablet (0.5 mg total) by mouth daily as needed for anxiety.    mirtazapine 7.5 MG tablet Commonly known as: REMERON Take 7.5 mg by mouth at bedtime.    NON FORMULARY Diet: _____ Regular, ______ NAS, ___x____Consistent Carbohydrate, _______NPO _____Other    polyethylene glycol 17 g packet Commonly known as: MIRALAX / GLYCOLAX Take 17 g by mouth daily.    [place] 0.02-0.005 % Soln Generic drug: Netarsudil-Latanoprost Apply 1 drop to eye at bedtime.    sitaGLIPtin 25 MG tablet Commonly known as: Januvia Take 1 tablet (25 mg total) by mouth daily.    vitamin B-12 1000 MCG tablet Commonly known as: CYANOCOBALAMIN Take 1 tablet (1,000 mcg total) by mouth daily.    Vitamin D 50 MCG (2000 UT) Caps Take 1 capsule by mouth daily.      Relevant Imaging Results:  Relevant Lab Results:   Additional Information    Kamare Caspers, Clydene Pugh, LCSW

## 2021-11-19 NOTE — Progress Notes (Signed)
Patient from [facility]. Returning today. D/c summary and FL2 sent to facility. Daughter to transport back to facility.    Gerilynn Mccullars, Clydene Pugh, LCSW

## 2021-11-19 NOTE — Plan of Care (Signed)
°  Problem: Acute Rehab PT Goals(only PT should resolve) Goal: Patient Will Transfer Sit To/From Stand Outcome: Progressing Flowsheets (Taken 11/19/2021 1133) Patient will transfer sit to/from stand: with supervision Goal: Pt Will Transfer Bed To Chair/Chair To Bed Outcome: Progressing Flowsheets (Taken 11/19/2021 1133) Pt will Transfer Bed to Chair/Chair to Bed: with supervision Goal: Pt Will Ambulate Outcome: Progressing Flowsheets (Taken 11/19/2021 1133) Pt will Ambulate:  75 feet  with supervision  with min guard assist  with least restrictive assistive device Goal: Pt/caregiver will Perform Home Exercise Program Outcome: Progressing Flowsheets (Taken 11/19/2021 1133) Pt/caregiver will Perform Home Exercise Program:  For increased strengthening  For improved balance  With Supervision, verbal cues required/provided  11:33 AM, 11/19/21 Mearl Latin PT, DPT Physical Therapist at [facility]

## 2021-12-24 ENCOUNTER — Other Ambulatory Visit (HOSPITAL_COMMUNITY): Payer: Self-pay | Admitting: Nephrology

## 2021-12-24 DIAGNOSIS — D472 Monoclonal gammopathy: Secondary | ICD-10-CM

## 2021-12-24 DIAGNOSIS — E1129 Type 2 diabetes mellitus with other diabetic kidney complication: Secondary | ICD-10-CM

## 2021-12-24 DIAGNOSIS — E1122 Type 2 diabetes mellitus with diabetic chronic kidney disease: Secondary | ICD-10-CM

## 2021-12-24 DIAGNOSIS — I952 Hypotension due to drugs: Secondary | ICD-10-CM

## 2021-12-24 DIAGNOSIS — D638 Anemia in other chronic diseases classified elsewhere: Secondary | ICD-10-CM

## 2021-12-24 DIAGNOSIS — R809 Proteinuria, unspecified: Secondary | ICD-10-CM

## 2021-12-24 DIAGNOSIS — I5032 Chronic diastolic (congestive) heart failure: Secondary | ICD-10-CM

## 2021-12-24 DIAGNOSIS — R768 Other specified abnormal immunological findings in serum: Secondary | ICD-10-CM

## 2021-12-25 ENCOUNTER — Emergency Department (HOSPITAL_COMMUNITY): Payer: PPO

## 2021-12-25 ENCOUNTER — Encounter (HOSPITAL_COMMUNITY): Payer: Self-pay

## 2021-12-25 ENCOUNTER — Observation Stay (HOSPITAL_COMMUNITY)
Admission: EM | Admit: 2021-12-25 | Discharge: 2021-12-27 | Disposition: A | Discharge status: Home / Self Care | Payer: PPO | Attending: Internal Medicine | Admitting: Internal Medicine

## 2021-12-25 ENCOUNTER — Other Ambulatory Visit: Payer: Self-pay

## 2021-12-25 DIAGNOSIS — R4182 Altered mental status, unspecified: Secondary | ICD-10-CM

## 2021-12-25 DIAGNOSIS — Z7984 Long term (current) use of oral hypoglycemic drugs: Secondary | ICD-10-CM | POA: Diagnosis not present

## 2021-12-25 DIAGNOSIS — N390 Urinary tract infection, site not specified: Secondary | ICD-10-CM | POA: Diagnosis not present

## 2021-12-25 DIAGNOSIS — N184 Chronic kidney disease, stage 4 (severe): Secondary | ICD-10-CM | POA: Diagnosis not present

## 2021-12-25 DIAGNOSIS — K5730 Diverticulosis of large intestine without perforation or abscess without bleeding: Secondary | ICD-10-CM | POA: Diagnosis not present

## 2021-12-25 DIAGNOSIS — Z87891 Personal history of nicotine dependence: Secondary | ICD-10-CM | POA: Insufficient documentation

## 2021-12-25 DIAGNOSIS — E1122 Type 2 diabetes mellitus with diabetic chronic kidney disease: Secondary | ICD-10-CM | POA: Diagnosis not present

## 2021-12-25 DIAGNOSIS — E876 Hypokalemia: Secondary | ICD-10-CM

## 2021-12-25 DIAGNOSIS — Z7982 Long term (current) use of aspirin: Secondary | ICD-10-CM | POA: Diagnosis not present

## 2021-12-25 DIAGNOSIS — F32A Depression, unspecified: Secondary | ICD-10-CM | POA: Diagnosis present

## 2021-12-25 DIAGNOSIS — E785 Hyperlipidemia, unspecified: Secondary | ICD-10-CM

## 2021-12-25 DIAGNOSIS — N179 Acute kidney failure, unspecified: Secondary | ICD-10-CM

## 2021-12-25 DIAGNOSIS — I129 Hypertensive chronic kidney disease with stage 1 through stage 4 chronic kidney disease, or unspecified chronic kidney disease: Secondary | ICD-10-CM | POA: Diagnosis not present

## 2021-12-25 DIAGNOSIS — M5136 Other intervertebral disc degeneration, lumbar region: Secondary | ICD-10-CM | POA: Insufficient documentation

## 2021-12-25 DIAGNOSIS — Z79899 Other long term (current) drug therapy: Secondary | ICD-10-CM | POA: Diagnosis not present

## 2021-12-25 DIAGNOSIS — G9340 Encephalopathy, unspecified: Principal | ICD-10-CM | POA: Insufficient documentation

## 2021-12-25 DIAGNOSIS — B961 Klebsiella pneumoniae [K. pneumoniae] as the cause of diseases classified elsewhere: Secondary | ICD-10-CM | POA: Diagnosis not present

## 2021-12-25 DIAGNOSIS — I10 Essential (primary) hypertension: Secondary | ICD-10-CM

## 2021-12-25 DIAGNOSIS — K219 Gastro-esophageal reflux disease without esophagitis: Secondary | ICD-10-CM

## 2021-12-25 DIAGNOSIS — E1169 Type 2 diabetes mellitus with other specified complication: Secondary | ICD-10-CM

## 2021-12-25 DIAGNOSIS — Z20822 Contact with and (suspected) exposure to covid-19: Secondary | ICD-10-CM | POA: Insufficient documentation

## 2021-12-25 DIAGNOSIS — Z853 Personal history of malignant neoplasm of breast: Secondary | ICD-10-CM | POA: Insufficient documentation

## 2021-12-25 DIAGNOSIS — R531 Weakness: Secondary | ICD-10-CM

## 2021-12-25 DIAGNOSIS — H409 Unspecified glaucoma: Secondary | ICD-10-CM | POA: Diagnosis present

## 2021-12-25 DIAGNOSIS — Z8673 Personal history of transient ischemic attack (TIA), and cerebral infarction without residual deficits: Secondary | ICD-10-CM

## 2021-12-25 LAB — URINALYSIS, MICROSCOPIC (REFLEX): WBC, UA: >50 WBC/hpf (ref 0–5)

## 2021-12-25 LAB — URINALYSIS, ROUTINE W REFLEX MICROSCOPIC
Bilirubin Urine: NEGATIVE
Glucose, UA: NEGATIVE mg/dL
Ketones, ur: NEGATIVE mg/dL
Nitrite: NEGATIVE
Protein, ur: 30 mg/dL — AB
Specific Gravity, Urine: 1.02 (ref 1.005–1.030)
pH: 6 (ref 5.0–8.0)

## 2021-12-25 LAB — CBC WITH DIFFERENTIAL/PLATELET
Abs Immature Granulocytes: 0.04 10*3/uL (ref 0.00–0.07)
Basophils Absolute: 0.1 10*3/uL (ref 0.0–0.1)
Basophils Relative: 1 %
Eosinophils Absolute: 0.4 10*3/uL (ref 0.0–0.5)
Eosinophils Relative: 5 %
HCT: 33.6 % — ABNORMAL LOW (ref 36.0–46.0)
Hemoglobin: 10.6 g/dL — ABNORMAL LOW (ref 12.0–15.0)
Immature Granulocytes: 1 %
Lymphocytes Relative: 17 %
Lymphs Abs: 1.5 10*3/uL (ref 0.7–4.0)
MCH: 29.8 pg (ref 26.0–34.0)
MCHC: 31.5 g/dL (ref 30.0–36.0)
MCV: 94.4 fL (ref 80.0–100.0)
Monocytes Absolute: 0.7 10*3/uL (ref 0.1–1.0)
Monocytes Relative: 8 %
Neutro Abs: 6.1 10*3/uL (ref 1.7–7.7)
Neutrophils Relative %: 68 %
Platelets: 234 10*3/uL (ref 150–400)
RBC: 3.56 MIL/uL — ABNORMAL LOW (ref 3.87–5.11)
RDW: 13.4 % (ref 11.5–15.5)
WBC: 8.9 10*3/uL (ref 4.0–10.5)
nRBC: 0 % (ref 0.0–0.2)

## 2021-12-25 LAB — TSH: TSH: 1.71 u[IU]/mL (ref 0.350–4.500)

## 2021-12-25 LAB — COMPREHENSIVE METABOLIC PANEL
ALT: 7 U/L (ref 0–44)
AST: 13 U/L — ABNORMAL LOW (ref 15–41)
Albumin: 3.3 g/dL — ABNORMAL LOW (ref 3.5–5.0)
Alkaline Phosphatase: 62 U/L (ref 38–126)
Anion gap: 8 (ref 5–15)
BUN: 40 mg/dL — ABNORMAL HIGH (ref 8–23)
CO2: 25 mmol/L (ref 22–32)
Calcium: 8.6 mg/dL — ABNORMAL LOW (ref 8.9–10.3)
Chloride: 104 mmol/L (ref 98–111)
Creatinine, Ser: 2.19 mg/dL — ABNORMAL HIGH (ref 0.44–1.00)
GFR, Estimated: 20 mL/min — ABNORMAL LOW (ref >60)
Glucose, Bld: 164 mg/dL — ABNORMAL HIGH (ref 70–99)
Potassium: 3 mmol/L — ABNORMAL LOW (ref 3.5–5.1)
Sodium: 137 mmol/L (ref 135–145)
Total Bilirubin: 0.6 mg/dL (ref 0.3–1.2)
Total Protein: 6.5 g/dL (ref 6.5–8.1)

## 2021-12-25 LAB — CBG MONITORING, ED
Glucose-Capillary: 156 mg/dL — ABNORMAL HIGH (ref 70–99)
Glucose-Capillary: 168 mg/dL — ABNORMAL HIGH (ref 70–99)

## 2021-12-25 LAB — GLUCOSE, CAPILLARY
Glucose-Capillary: 180 mg/dL — ABNORMAL HIGH (ref 70–99)
Glucose-Capillary: 227 mg/dL — ABNORMAL HIGH (ref 70–99)

## 2021-12-25 LAB — PHOSPHORUS: Phosphorus: 4.5 mg/dL (ref 2.5–4.6)

## 2021-12-25 LAB — HEMOGLOBIN A1C
Hgb A1c MFr Bld: 6.6 % — ABNORMAL HIGH (ref 4.8–5.6)
Mean Plasma Glucose: 142.72 mg/dL

## 2021-12-25 LAB — RESP PANEL BY RT-PCR (FLU A&B, COVID) ARPGX2
Influenza A by PCR: NEGATIVE
Influenza B by PCR: NEGATIVE
SARS Coronavirus 2 by RT PCR: NEGATIVE

## 2021-12-25 LAB — LACTIC ACID, PLASMA: Lactic Acid, Venous: 1.1 mmol/L (ref 0.5–1.9)

## 2021-12-25 LAB — PROTIME-INR
INR: 1 (ref 0.8–1.2)
Prothrombin Time: 12.6 seconds (ref 11.4–15.2)

## 2021-12-25 LAB — MAGNESIUM: Magnesium: 2 mg/dL (ref 1.7–2.4)

## 2021-12-25 MED ORDER — ASPIRIN 81 MG PO CHEW
81.0000 mg | CHEWABLE_TABLET | Freq: Every day | ORAL | Status: DC
  Administered 2021-12-26 – 2021-12-27 (×2): 81 mg via ORAL
  Filled 2021-12-25 (×3): qty 1

## 2021-12-25 MED ORDER — SODIUM CHLORIDE 0.9 % IV SOLN
1.0000 g | Freq: Once | INTRAVENOUS | Status: AC
  Administered 2021-12-25: 1 g via INTRAVENOUS
  Filled 2021-12-25: qty 10

## 2021-12-25 MED ORDER — CYCLOSPORINE 0.05 % OP EMUL
1.0000 [drp] | Freq: Two times a day (BID) | OPHTHALMIC | Status: DC
  Administered 2021-12-25 – 2021-12-27 (×4): 1 [drp] via OPHTHALMIC
  Filled 2021-12-25 (×4): qty 30

## 2021-12-25 MED ORDER — ENSURE ENLIVE PO LIQD
237.0000 mL | Freq: Two times a day (BID) | ORAL | Status: DC
  Administered 2021-12-25 – 2021-12-26 (×2): 237 mL via ORAL
  Filled 2021-12-25 (×2): qty 237

## 2021-12-25 MED ORDER — INSULIN ASPART 100 UNIT/ML IJ SOLN
0.0000 [IU] | Freq: Every day | INTRAMUSCULAR | Status: DC
  Administered 2021-12-25: 2 [IU] via SUBCUTANEOUS

## 2021-12-25 MED ORDER — LACTATED RINGERS IV BOLUS (SEPSIS)
500.0000 mL | Freq: Once | INTRAVENOUS | Status: AC
  Administered 2021-12-25: 500 mL via INTRAVENOUS

## 2021-12-25 MED ORDER — MIRTAZAPINE 15 MG PO TABS
7.5000 mg | ORAL_TABLET | Freq: Every day | ORAL | Status: DC
  Administered 2021-12-25 – 2021-12-26 (×2): 7.5 mg via ORAL
  Filled 2021-12-25 (×2): qty 1

## 2021-12-25 MED ORDER — POTASSIUM CHLORIDE 10 MEQ/100ML IV SOLN
10.0000 meq | INTRAVENOUS | Status: AC
  Administered 2021-12-25 (×4): 10 meq via INTRAVENOUS
  Filled 2021-12-25 (×4): qty 100

## 2021-12-25 MED ORDER — BRIMONIDINE TARTRATE 0.2 % OP SOLN
1.0000 [drp] | Freq: Three times a day (TID) | OPHTHALMIC | Status: DC
  Administered 2021-12-25 – 2021-12-26 (×5): 1 [drp] via OPHTHALMIC
  Filled 2021-12-25 (×2): qty 5

## 2021-12-25 MED ORDER — VITAMIN B-12 1000 MCG PO TABS
1000.0000 ug | ORAL_TABLET | Freq: Every day | ORAL | Status: DC
  Administered 2021-12-25 – 2021-12-27 (×3): 1000 ug via ORAL
  Filled 2021-12-25 (×3): qty 1

## 2021-12-25 MED ORDER — POTASSIUM CHLORIDE CRYS ER 20 MEQ PO TBCR
20.0000 meq | EXTENDED_RELEASE_TABLET | Freq: Once | ORAL | Status: AC
Start: 2021-12-25 — End: 2021-12-25
  Administered 2021-12-25: 20 meq via ORAL
  Filled 2021-12-25: qty 1

## 2021-12-25 MED ORDER — FAMOTIDINE 20 MG PO TABS
20.0000 mg | ORAL_TABLET | Freq: Every day | ORAL | Status: DC
  Administered 2021-12-25 – 2021-12-27 (×3): 20 mg via ORAL
  Filled 2021-12-25 (×4): qty 1

## 2021-12-25 MED ORDER — NETARSUDIL-LATANOPROST 0.02-0.005 % OP SOLN: 1.0000 [drp] | Freq: Every day | OPHTHALMIC | Status: DC

## 2021-12-25 MED ORDER — DORZOLAMIDE HCL-TIMOLOL MAL 2-0.5 % OP SOLN
1.0000 [drp] | Freq: Two times a day (BID) | OPHTHALMIC | Status: DC
  Administered 2021-12-25 – 2021-12-27 (×4): 1 [drp] via OPHTHALMIC
  Filled 2021-12-25 (×2): qty 10

## 2021-12-25 MED ORDER — PANTOPRAZOLE SODIUM 40 MG PO TBEC
40.0000 mg | DELAYED_RELEASE_TABLET | Freq: Every day | ORAL | Status: DC
  Administered 2021-12-25 – 2021-12-27 (×3): 40 mg via ORAL
  Filled 2021-12-25 (×3): qty 1

## 2021-12-25 MED ORDER — INSULIN ASPART 100 UNIT/ML IJ SOLN
0.0000 [IU] | Freq: Three times a day (TID) | INTRAMUSCULAR | Status: DC
  Administered 2021-12-25 – 2021-12-26 (×3): 1 [IU] via SUBCUTANEOUS
  Administered 2021-12-26: 2 [IU] via SUBCUTANEOUS
  Administered 2021-12-26: 1 [IU] via SUBCUTANEOUS
  Administered 2021-12-27: 2 [IU] via SUBCUTANEOUS
  Filled 2021-12-25: qty 1

## 2021-12-25 MED ORDER — SODIUM CHLORIDE 0.9 % IV SOLN
1.0000 g | INTRAVENOUS | Status: DC
  Administered 2021-12-26 – 2021-12-27 (×2): 1 g via INTRAVENOUS
  Filled 2021-12-25 (×2): qty 10

## 2021-12-25 MED ORDER — HYDRALAZINE HCL 25 MG PO TABS
50.0000 mg | ORAL_TABLET | Freq: Three times a day (TID) | ORAL | Status: DC
  Administered 2021-12-25 – 2021-12-27 (×8): 50 mg via ORAL
  Filled 2021-12-25 (×8): qty 2

## 2021-12-25 MED ORDER — AMLODIPINE BESYLATE 5 MG PO TABS
2.5000 mg | ORAL_TABLET | Freq: Every day | ORAL | Status: DC
  Administered 2021-12-25 – 2021-12-27 (×3): 2.5 mg via ORAL
  Filled 2021-12-25 (×3): qty 1

## 2021-12-25 MED ORDER — LACTATED RINGERS IV SOLN: INTRAVENOUS | Status: DC

## 2021-12-25 MED ORDER — ENOXAPARIN SODIUM 30 MG/0.3ML IJ SOSY
30.0000 mg | PREFILLED_SYRINGE | INTRAMUSCULAR | Status: DC
  Administered 2021-12-25 – 2021-12-26 (×2): 30 mg via SUBCUTANEOUS
  Filled 2021-12-25 (×2): qty 0.3

## 2021-12-25 NOTE — Assessment & Plan Note (Addendum)
-  A1c 6.6 -Continue to follow oral hypoglycemic agents at discharge; watch the amount of carbohydrates in patient's diet. -Maintain adequate hydration.

## 2021-12-25 NOTE — H&P (Addendum)
History and Physical    Patient: Nancy Medina:[id number] DOB: 1925/02/21 DOA: 12/25/2021 DOS: the patient was seen and examined on 12/25/2021 PCP: Vilinda Boehringer, NP  Patient coming from: SNF, [facility].  Chief Complaint:  Chief Complaint  Patient presents with   Altered Mental Status    HPI: Nancy Medina is a 88 y.o. female with medical history significant of type 2 diabetes mellitus, chronic kidney disease stage IV, hypertension, hyperlipidemia, history of stroke, memory loss/early dementia, gastroesophageal reflux disease and glaucoma; who presented to the hospital secondary to altered mental status.  Patient at baseline is confused but talkative unable to follow commands and with assistance performing activities of daily living.  [facility] lives at [facility].  On the day she presented to the emergency department patient was not able to answer questions, function as she normally will do and demonstrated out of proportion generalized weakness.  There has not been any chest pain, cough, fever, nausea, vomiting, hematuria, melena, hematochezia, sick contacts or any other complaints.    Given patient confusion she is limited to provide any further information currently.  Per records patient is vaccinated against COVID but no boosted; COVID PCR in the ED negative.  In ED: Chest x-ray demonstrated no acute cardiopulmonary process, CT head without acute intracranial abnormalities once again demonstrated all previous nonhemorrhagic stroke affecting left parietal occipital area and lacunar infarcts in her cerebellar hemispheres. Urinalysis suggesting the presence of acute infection, with marked leukocyte esterase and many bacteria. Normal WBCs and currently afebrile; lactic acid within normal limits. Patient received IV fluids, cultures were taken and patient started on IV ceftriaxone.  TRH has been contacted to place in the hospital for further evaluation and  management.  Review of Systems: As mentioned in the history of present illness. All other systems reviewed and are negative. Past Medical History:  Diagnosis Date   Achilles tendinitis    Allergic rhinitis    Anemia of chronic disease    at least back to 2010   Anxiety    Benign paroxysmal positional vertigo    Cataract    Cerebrovascular accident [facility])    Chronic kidney disease (CKD), stage III (moderate) (HCC)    Degenerative disc disease, thoracic    Depression    Diverticulitis    DJD (degenerative joint disease)    DM (diabetes mellitus) ([place])    type II   GERD (gastroesophageal reflux disease)    HTN (hypertension)    HX: breast cancer    Hyperlipidemia    Hypertonicity of bladder    Knee pain, left    Leg pain    Lymphocytic colitis 2008   treated with Lialda short-term   Memory loss    Obesity    Osteoporosis, unspecified    Renal disease    stage III   Unspecified fall    Unspecified glaucoma(365.9)    Past Surgical History:  Procedure Laterality Date   Bladder tack  2000   BREAST LUMPECTOMY  2004   right   cataracts     CHOLECYSTECTOMY  1960   COLONOSCOPY  07/2007   lymphocytic colitis   ESOPHAGOGASTRODUODENOSCOPY  06/24/2011   Procedure: ESOPHAGOGASTRODUODENOSCOPY (EGD);  Surgeon: Dorothyann Peng, MD;  Location: [facility];  Service: Endoscopy;  Laterality: N/A;.  Peptic stricture, mild erosive reflux esophagitis, hiatal   FEMUR IM NAIL Left 04/28/2020   Procedure: INTRAMEDULLARY (IM) NAIL FEMORAL;  Surgeon: Marchia Bond, MD;  Location: [facility];  Service: Orthopedics;  Laterality:  Left;   FLEXIBLE SIGMOIDOSCOPY  11/25/2011   MODERATE DIVERTICULOSIS/INTERNAL HEMORRHOIDS   S/P Hysterectomy  2000   benign reasons   SAVORY DILATION  06/24/2011   Procedure: SAVORY DILATION;  Surgeon: Dorothyann Peng, MD;  Location: [facility];  Service: Endoscopy;  Laterality: N/A;   Social History:  reports that she has quit smoking. Her smoking use included cigarettes. She  smoked an average of 1 pack per day. She has never used smokeless tobacco. She reports that she does not drink alcohol and does not use drugs.  Allergies  Allergen Reactions   Metformin     This allergy is not listed under records with [place] facility. Past records states that patient "felt poor" as a reaction to taking this medication    Family History  Problem Relation Age of Onset   Arthritis Mother        died at age 64   Other Father        died at age 46 - horse accident   Colon cancer Neg Hx            Liver disease Neg Hx            GI problems Neg Hx             Prior to Admission medications   Medication Sig Start Date End Date Taking? Authorizing Provider  acetaminophen (TYLENOL) 325 MG tablet Take 2 tablets (650 mg total) by mouth every 6 (six) hours as needed for mild pain (or Fever >/= 101). 11/19/21  Yes Emokpae, Courage, MD  amLODipine (NORVASC) 2.5 MG tablet Take 2.5 mg by mouth daily. 12/16/21  Yes [provider]  aspirin 81 MG tablet Take 1 tablet (81 mg total) by mouth daily with breakfast. 11/19/21  Yes Emokpae, Courage, MD  brimonidine (ALPHAGAN) 0.2 % ophthalmic solution Place 1 drop into both eyes every 8 (eight) hours. 02/23/21  Yes [provider]  Cholecalciferol (VITAMIN D) 2000 units CAPS Take 1 capsule by mouth daily.   Yes [provider]  clonazePAM (KLONOPIN) 0.5 MG tablet Take 0.5 mg by mouth at bedtime. 12/16/21  Yes [provider]  cycloSPORINE (RESTASIS) 0.05 % ophthalmic emulsion Place 1 drop into both eyes 2 (two) times daily.   Yes [provider]  dorzolamide-timolol (COSOPT) 22.3-6.8 MG/ML ophthalmic solution Place 1 drop into both eyes in the morning and at bedtime. 07/15/20  Yes [provider]  famotidine (PEPCID) 20 MG tablet Take 20 mg by mouth daily. 10/21/21  Yes [provider]  feeding supplement (ENSURE ENLIVE / ENSURE PLUS) LIQD Take 237 mLs by mouth 2 (two)  times daily between meals. 11/19/21  Yes Emokpae, Courage, MD  furosemide (LASIX) 20 MG tablet Take 20 mg by mouth daily.   Yes [provider]  hydrALAZINE (APRESOLINE) 50 MG tablet Take 1 tablet (50 mg total) by mouth 3 (three) times daily. 11/19/21  Yes Emokpae, Courage, MD  loperamide (IMODIUM A-D) 2 MG tablet Take 2 mg by mouth 4 (four) times daily as needed for diarrhea or loose stools.   Yes [provider]  LORazepam (ATIVAN) 0.5 MG tablet Take 1 tablet (0.5 mg total) by mouth daily as needed for anxiety. 11/19/21  Yes Emokpae, Courage, MD  mirtazapine (REMERON) 7.5 MG tablet Take 7.5 mg by mouth at bedtime. 07/01/21  Yes [provider]  ROCKLATAN 0.02-0.005 % SOLN Place 1 drop into both eyes at bedtime. 02/20/21  Yes [provider]  sitaGLIPtin (JANUVIA) 25 MG tablet Take 1 tablet (25 mg total) by mouth daily. 03/20/18  Yes Tat, Shanon Brow, MD  vitamin B-12 (CYANOCOBALAMIN) 1000 MCG tablet Take 1 tablet (1,000 mcg total) by mouth daily. 03/20/18  Yes Tat, Shanon Brow, MD  NON FORMULARY Diet: _____ Regular, ______ NAS, ___x____Consistent Carbohydrate, _______NPO _____Other    [provider]  polyethylene glycol (MIRALAX / GLYCOLAX) 17 g packet Take 17 g by mouth daily. Patient not taking: Reported on 12/25/2021 11/19/21   Roxan Hockey, MD    Physical Exam: Vitals:   12/25/21 1200 12/25/21 1230 12/25/21 1300 12/25/21 1330  BP: (!) 187/74 (!) 150/87 (!) 180/86 (!) 172/74  Pulse: 64 67 68 73  Resp: 15 14 17 13   Temp:      TempSrc:      SpO2: 97% 96% 97% 97%  Weight:      Height:       General exam: Patient is awake, alert, intermittently follow simple commands and having difficulty verbally communicating.  She is able to move all 4 limbs spontaneously.  No fever, no nausea, no vomiting.  No chest pain Respiratory system: Good saturation on room air, no wheezing, no crackles, no using accessory muscles. Cardiovascular system: RRR, no rubs, no  gallops, no JVD. Gastrointestinal system: Abdomen is nondistended, soft and nontender. No organomegaly or masses felt. Normal bowel sounds heard. Central nervous system: No focal neurological deficits. Extremities: No cyanosis or clubbing. Skin: No petechiae. Psychiatry: Unable to properly assess secondary to current mentation.  Mood appears to be stable.   Data Reviewed: As mentioned above CT scan demonstrated chronic left occipitoparietal infarct along with lacunar infarcts in her cerebellar hemispheres; acute intracranial abnormalities. Chest x-ray: Demonstrating no acute cardiopulmonary process. Urinalysis with many bacteria and positive leukocyte esterase; prior cultures has demonstrated isolation of Klebsiella pneumonia and microorganism resistant to nitrofurantoin and ampicillin. Normal WBCs, acute on chronic renal failure with elevation in her baseline creatinine (normally 1.6--1.8) at presentation 2.19. Positive hypokalemia with a potassium of 3.0.  Assessment and Plan: * Acute encephalopathy- (present on admission) -In the setting of metabolic encephalopathy from UTI -Patient with underlying cognitive impairment/dementia. -Follow culture results and continue current antibiotics. -Provide supportive care and adequate hydration -Constant reorientation.  Hypokalemia- (present on admission) -Checking magnesium and phosphorus level -Replete electrolytes and follow trend.   CKD (chronic kidney disease) stage 4, GFR 15-29 ml/min (HCC)- (present on admission) -Patient presented with acute kidney injury and chronic stage IV renal failure. -Most likely in the setting of prerenal azotemia and UTI. -We will provide fluid resuscitation, avoid the use of nephrotoxic agents and contrast; treat UTI as mentioned above with antibiotics and follow culture results. -CT scan of the abdomen demonstrated no hydronephrosis.  History of ischemic stroke -No new focal deficits  appreciated. -Continue risk factor modifications and aspirin for secondary prevention. -CT scan has demonstrated no acute intracranial abnormalities.  GERD- (present on admission) -Continue the use of famotidine and PPI  Essential hypertension- (present on admission) -Resume home hydralazine and amlodipine. -Heart healthy diet has been ordered. -Follow vital signs and further adjust antihypertensive treatment as required.  Unspecified glaucoma- (present on admission) -Resume home eyedrops regimen. -Continue outpatient follow-up with ophthalmology service.  Depression- (present on admission) -Continue the use of Remeron.  Type 2 diabetes mellitus with hyperlipidemia ([place])- (present on admission) -Will check A1c -Hold oral hypoglycemic agents while inpatient. -Started on sliding scale insulin and will follow CBGs fluctuation to further adjust management.   Advance Care Planning:  Code Status: DNR   Consults: None  Family Communication: No family at bedside at time of evaluation.  Severity of Illness: The appropriate patient status for this patient is OBSERVATION. Observation status is judged to be reasonable and necessary in order to provide the required intensity of service to ensure the patient's safety. The patient's presenting symptoms, physical exam findings, and initial radiographic and laboratory data in the context of their medical condition is felt to place them at decreased risk for further clinical deterioration. Furthermore, it is anticipated that the patient will be medically stable for discharge from the hospital within 2 midnights of admission.   Author: Barton Dubois, MD 12/25/2021 3:02 PM  For on call review [email or web address].

## 2021-12-25 NOTE — Assessment & Plan Note (Addendum)
-  Continue the use of famotidine and PPI. -Patient denies any GI complaints at this time.

## 2021-12-25 NOTE — Assessment & Plan Note (Addendum)
-  In the setting of metabolic encephalopathy from UTI. -Improved after fluid resuscitation and antibiotics -Mentation appears to be back to baseline. -Patient with underlying cognitive impairment/dementia. -Continue constant reorientation; maintain adequate hydration and completed treatment for UTI.

## 2021-12-25 NOTE — Assessment & Plan Note (Signed)
-  No new focal deficits appreciated. -Continue risk factor modifications and aspirin for secondary prevention. -CT scan has demonstrated no acute intracranial abnormalities.

## 2021-12-25 NOTE — ED Notes (Signed)
Report given to Acute Care. Pt stable and ready for transfer.

## 2021-12-25 NOTE — Assessment & Plan Note (Addendum)
-  Patient presented with acute kidney injury on chronic stage IV renal failure. -Most likely in the setting of prerenal azotemia and UTI. -Improve and back to baseline at time of discharge. -Continue to maintain adequate hydration -Continue treatment for UTI -Repeat basic metabolic panel follow-up visit to assess electrolytes and renal function.

## 2021-12-25 NOTE — ED Provider Notes (Signed)
[facility] EMERGENCY DEPARTMENT Provider Note   CSN: [id number] Arrival date & time: 12/25/21  6789     History  Chief Complaint  Patient presents with   Altered Mental Status    Nancy Medina is a 88 y.o. female.  HPI Patient presents for altered mental status and generalized weakness.  She arrives from [facility] facility via EMS.  History is very limited.  I spoke with a sitter on scene he stated that patient was not acting like her normal baseline.  She is reportedly talkative at baseline.  Patient is currently very slow to respond and not able to fully answer questions.  She has a history of CVA.  It is unclear if she has dementia.  Her baseline mobility is unclear as well.  EMS did note a wheelchair in the room.  She was reportedly last seen in her normal by family 12 hours ago.  Per chart review, her medical history is notable for CHF, chronic anemia, anxiety, CVA, CKD, depression, DM, GERD, HTN, HLD.  Her medications include amlodipine, ASA, various ophthalmic drops, Pepcid, Lasix, hydralazine, Ativan, Remeron, and Januvia.  Per chart review, she was seen by her nephrologist yesterday.  At that time, her blood pressure was low.  She was instructed to hold her amlodipine.    Home Medications Prior to Admission medications   Medication Sig Start Date End Date Taking? Authorizing Provider  acetaminophen (TYLENOL) 325 MG tablet Take 2 tablets (650 mg total) by mouth every 6 (six) hours as needed for mild pain (or Fever >/= 101). 11/19/21   Roxan Hockey, MD  amLODipine (NORVASC) 10 MG tablet Take 1 tablet (10 mg total) by mouth daily. 11/19/21   Roxan Hockey, MD  aspirin 81 MG tablet Take 1 tablet (81 mg total) by mouth daily with breakfast. 11/19/21   Emokpae, Courage, MD  brimonidine (ALPHAGAN) 0.2 % ophthalmic solution Place 1 drop into both eyes every 8 (eight) hours. 02/23/21   [provider]  Cholecalciferol (VITAMIN D) 2000 units CAPS Take 1  capsule by mouth daily.    [provider]  cycloSPORINE (RESTASIS) 0.05 % ophthalmic emulsion Place 1 drop into both eyes 2 (two) times daily.    [provider]  dorzolamide-timolol (COSOPT) 22.3-6.8 MG/ML ophthalmic solution Place 1 drop into both eyes in the morning and at bedtime. 07/15/20   [provider]  famotidine (PEPCID) 20 MG tablet Take 20 mg by mouth daily. 10/21/21   [provider]  feeding supplement (ENSURE ENLIVE / ENSURE PLUS) LIQD Take 237 mLs by mouth 2 (two) times daily between meals. 11/19/21   Roxan Hockey, MD  furosemide (LASIX) 20 MG tablet Take 20 mg by mouth daily.    [provider]  hydrALAZINE (APRESOLINE) 50 MG tablet Take 1 tablet (50 mg total) by mouth 3 (three) times daily. 11/19/21   Roxan Hockey, MD  loperamide (IMODIUM A-D) 2 MG tablet Take 2 mg by mouth 4 (four) times daily as needed for diarrhea or loose stools.    [provider]  LORazepam (ATIVAN) 0.5 MG tablet Take 1 tablet (0.5 mg total) by mouth daily as needed for anxiety. 11/19/21   Roxan Hockey, MD  mirtazapine (REMERON) 7.5 MG tablet Take 7.5 mg by mouth at bedtime. 07/01/21   [provider]  NON FORMULARY Diet: _____ Regular, ______ NAS, ___x____Consistent Carbohydrate, _______NPO _____Other    [provider]  polyethylene glycol (MIRALAX / GLYCOLAX) 17 g packet Take 17 g by  mouth daily. 11/19/21   Emokpae, Courage, MD  ROCKLATAN 0.02-0.005 % SOLN Apply 1 drop to eye at bedtime. 02/20/21   [provider]  sitaGLIPtin (JANUVIA) 25 MG tablet Take 1 tablet (25 mg total) by mouth daily. 03/20/18   Orson Eva, MD  vitamin B-12 (CYANOCOBALAMIN) 1000 MCG tablet Take 1 tablet (1,000 mcg total) by mouth daily. 03/20/18   Orson Eva, MD      Allergies    Metformin    Review of Systems   Review of Systems  Unable to perform ROS: Mental status change   Physical Exam Updated Vital Signs BP (!) 184/81    Pulse  (!) 59    Temp 97.7 F (36.5 C) (Rectal)    Resp 14    Ht 5\' 4"  (1.626 m)    Wt 67.1 kg    SpO2 96%    BMI 25.40 kg/m  Physical Exam Constitutional:      General: She is not in acute distress.    Appearance: She is normal weight. She is ill-appearing. She is not toxic-appearing or diaphoretic.  HENT:     Head: Normocephalic and atraumatic.     Right Ear: External ear normal.     Left Ear: External ear normal.     Nose: Nose normal.     Mouth/Throat:     Mouth: Mucous membranes are dry.     Pharynx: Oropharynx is clear.  Eyes:     General: No scleral icterus.    Extraocular Movements: Extraocular movements intact.  Cardiovascular:     Rate and Rhythm: Normal rate and regular rhythm.  Pulmonary:     Effort: Pulmonary effort is normal. No respiratory distress.     Breath sounds: Normal breath sounds. No wheezing.  Chest:     Chest wall: No tenderness.  Abdominal:     Palpations: Abdomen is soft.     Tenderness: There is no abdominal tenderness.  Musculoskeletal:        General: No swelling or deformity.     Cervical back: Normal range of motion. No rigidity.  Skin:    General: Skin is warm and dry.     Coloration: Skin is not jaundiced or pale.  Neurological:     Mental Status: She is alert. She is disoriented.     Cranial Nerves: No cranial nerve deficit.     Sensory: No sensory deficit.     Motor: No weakness.    ED Results / Procedures / Treatments   Labs (all labs ordered are listed, but only abnormal results are displayed) Labs Reviewed  COMPREHENSIVE METABOLIC PANEL - Abnormal; Notable for the following components:      Result Value   Potassium 3.0 (*)    Glucose, Bld 164 (*)    BUN 40 (*)    Creatinine, Ser 2.19 (*)    Calcium 8.6 (*)    Albumin 3.3 (*)    AST 13 (*)    GFR, Estimated 20 (*)    All other components within normal limits  CBC WITH DIFFERENTIAL/PLATELET - Abnormal; Notable for the following components:   RBC 3.56 (*)    Hemoglobin 10.6 (*)     HCT 33.6 (*)    All other components within normal limits  URINALYSIS, ROUTINE W REFLEX MICROSCOPIC - Abnormal; Notable for the following components:   APPearance CLOUDY (*)    Hgb urine dipstick TRACE (*)    Protein, ur 30 (*)    Leukocytes,Ua LARGE (*)  All other components within normal limits  URINALYSIS, MICROSCOPIC (REFLEX) - Abnormal; Notable for the following components:   Bacteria, UA MANY (*)    All other components within normal limits  CBG MONITORING, ED - Abnormal; Notable for the following components:   Glucose-Capillary 156 (*)    All other components within normal limits  CULTURE, BLOOD (ROUTINE X 2)  CULTURE, BLOOD (ROUTINE X 2)  RESP PANEL BY RT-PCR (FLU A&B, COVID) ARPGX2  URINE CULTURE  MAGNESIUM  PROTIME-INR  LACTIC ACID, PLASMA  TSH    EKG None  Radiology CT HEAD WO CONTRAST  Result Date: 12/25/2021 CLINICAL DATA:  Mental status changes. EXAM: CT HEAD WITHOUT CONTRAST TECHNIQUE: Contiguous axial images were obtained from the base of the skull through the vertex without intravenous contrast. RADIATION DOSE REDUCTION: This exam was performed according to the departmental dose-optimization program which includes automated exposure control, adjustment of the mA and/or kV according to patient size and/or use of iterative reconstruction technique. COMPARISON:  11/18/2021 FINDINGS: Brain: There is no evidence for acute hemorrhage, hydrocephalus, mass lesion, or abnormal extra-axial fluid collection. No definite CT evidence for acute infarction. Diffuse loss of parenchymal volume is consistent with atrophy. Patchy low attenuation in the deep hemispheric and periventricular white matter is nonspecific, but likely reflects chronic microvascular ischemic demyelination. Old left parietooccipital infarct again noted. Lacunar infarcts noted in the cerebellar hemispheres bilaterally. Vascular: No hyperdense vessel or unexpected calcification. Skull: No evidence for fracture.  No worrisome lytic or sclerotic lesion. Sinuses/Orbits: The visualized paranasal sinuses and mastoid air cells are clear. Visualized portions of the globes and intraorbital fat are unremarkable. Other: None. IMPRESSION: 1. No acute intracranial abnormality. 2. Atrophy with chronic small vessel ischemic disease. 3. Old left parietooccipital infarct with lacunar infarcts in the cerebellar hemispheres again noted. Electronically Signed   By: Misty Stanley M.D.   On: 12/25/2021 08:46   DG Chest Port 1 View  Result Date: 12/25/2021 CLINICAL DATA:  Altered mental status. EXAM: PORTABLE CHEST 1 VIEW COMPARISON:  04/27/2020 FINDINGS: 0806 hours. The cardio pericardial silhouette is enlarged. Interstitial markings are diffusely coarsened with chronic features. Bones are diffusely demineralized. Telemetry leads overlie the chest. IMPRESSION: Chronic interstitial changes without acute cardiopulmonary findings. Electronically Signed   By: Misty Stanley M.D.   On: 12/25/2021 08:21    Procedures Procedures    Medications Ordered in ED Medications  potassium chloride 10 mEq in 100 mL IVPB (10 mEq Intravenous New Bag/Given 12/25/21 0929)  lactated ringers infusion ( Intravenous New Bag/Given 12/25/21 0925)  lactated ringers bolus 500 mL (0 mLs Intravenous Stopped 12/25/21 0924)  cefTRIAXone (ROCEPHIN) 1 g in sodium chloride 0.9 % 100 mL IVPB (0 g Intravenous Stopped 12/25/21 0929)  lactated ringers bolus 500 mL (0 mLs Intravenous Stopped 12/25/21 1003)    ED Course/ Medical Decision Making/ A&P                           Medical Decision Making Amount and/or Complexity of Data Reviewed Labs: ordered. Radiology: ordered.  Risk Prescription drug management. Decision regarding hospitalization.   This patient presents to the ED for concern of altered mental status, this involves an extensive number of treatment options, and is a complaint that carries with it a high risk of complications and morbidity.  The  differential diagnosis includes sepsis, polypharmacy, metabolic abnormalities, hypoglycemia, dehydration, CVA, acute anemia   Co morbidities that complicate the patient evaluation  chronic anemia, anxiety, CVA,  CKD, depression, DM, GERD, HTN, HLD   Additional history obtained:  Additional history obtained from EMS, patient's daughter External records from outside source obtained and reviewed including EMR   Lab Tests:  I Ordered, and personally interpreted labs.  The pertinent results include: Hypokalemia, slight anemia, no leukocytosis, normal lactic acid, creatinine and BUN slightly above baseline, UA consistent with acute UTI.   Imaging Studies ordered:  I ordered imaging studies including chest x-ray, CT head, CT of abdomen and pelvis I independently visualized and interpreted imaging which showed no acute findings on chest x-ray or CT head.  The results of CT abdomen/pelvis pending at time of admission I agree with the radiologist interpretation   Cardiac Monitoring:  The patient was maintained on a cardiac monitor.  I personally viewed and interpreted the cardiac monitored which showed an underlying rhythm of: Sinus rhythm   Medicines ordered and prescription drug management:  I ordered medication including IV fluids and antibiotics for UTI Reevaluation of the patient after these medicines showed that the patient improved I have reviewed the patients home medicines and have made adjustments as needed  Problem List / ED Course:  88 year old female with history of dementia, presenting from skilled nursing facility for change in mental status.  History is provided by EMS and her daughter.  Patient's daughter was with the patient yesterday and accompanied her to nephrology clinic appointment.  At that time, patient was at her mental baseline.  They enjoyed a nice lunch together.  Today, patient's daughter confirms that patient is at her mental baseline.  On her arrival in the  ED, patient is able to answer questions and follow commands.  She has global weakness.  She does not have any focal neurologic deficits.  She denies any discomfort at this time.  Diagnostic work-up was initiated.  CT scan of head showed no acute findings.  Lab work showed hypokalemia and UTI.  On reassessment following initial 500 cc bolus of IV fluids, patient does appear improved in strength and conversational ability.  Ceftriaxone was ordered.  Additional IV fluids were ordered.  Patient's daughter was updated on lab findings.  Daughter states that patient does have a history of UTIs.  Although she has never seen her mother this week from a UTI, she does state that she notices changes in the patient's mentation when the UTI is present.  Patient's daughter is not aware of any recent urine catheterization that the patient has undergone.  She is not aware of any recent vomiting or diarrhea.  Given that she is elderly and not at her mental baseline, patient to be admitted.  Given limited history and concern of possible stercoral colitis and/or complicated UTI, noncontrasted CT scan of abdomen pelvis was ordered.   Reevaluation:  After the interventions noted above, I reevaluated the patient and found that they have :improved   Social Determinants of Health:  Elderly, dementia, lives in skilled nursing facility   Dispostion:  After consideration of the diagnostic results and the patients response to treatment, I feel that the patent would benefit from admission.          Final Clinical Impression(s) / ED Diagnoses Final diagnoses:  Altered mental status, unspecified altered mental status type  Generalized weakness  Urinary tract infection without hematuria, site unspecified    Rx / DC Orders ED Discharge Orders     None         Godfrey Pick, MD 12/25/21 1017

## 2021-12-25 NOTE — Assessment & Plan Note (Addendum)
-  Resume home hydralazine and amlodipine. -Heart healthy diet has been ordered. -Follow vital signs and further adjust antihypertensive treatment as required. -Blood pressure overall stable. -Heart healthy/low-sodium diet recommended.

## 2021-12-25 NOTE — Assessment & Plan Note (Addendum)
-  Magnesium within normal limits. -Potassium has been repleted and is stable at time of discharge. -Repeat basic metabolic panel to evaluate electrolytes trend/stability.

## 2021-12-25 NOTE — Progress Notes (Signed)
Received patient from ED, she is alert to self and place. Meds given crushed in applesauce.

## 2021-12-25 NOTE — Assessment & Plan Note (Signed)
-  Resume home eyedrops regimen. -Continue outpatient follow-up with ophthalmology service.

## 2021-12-25 NOTE — Assessment & Plan Note (Addendum)
-  Continue the use of Remeron. -Mood is overall stable at this time. -No hallucinations or suicidal ideation.Marland Kitchen

## 2021-12-25 NOTE — Plan of Care (Signed)

## 2021-12-25 NOTE — ED Triage Notes (Signed)
Patient brought in via EMS from [facility]. Patient alert but nonverbal. Per paramedic sitter stated that patient is normally talkative and joking. Patient has some "slight" dementia per daughter and usually walks with walker. Patient able to follow commands, weak in all extremities. Patient assessed by Dr Doren Custard at [phone].

## 2021-12-26 DIAGNOSIS — K219 Gastro-esophageal reflux disease without esophagitis: Secondary | ICD-10-CM | POA: Diagnosis not present

## 2021-12-26 DIAGNOSIS — G9340 Encephalopathy, unspecified: Secondary | ICD-10-CM | POA: Diagnosis not present

## 2021-12-26 DIAGNOSIS — N184 Chronic kidney disease, stage 4 (severe): Secondary | ICD-10-CM | POA: Diagnosis not present

## 2021-12-26 DIAGNOSIS — F32A Depression, unspecified: Secondary | ICD-10-CM | POA: Diagnosis not present

## 2021-12-26 LAB — BASIC METABOLIC PANEL
Anion gap: 9 (ref 5–15)
BUN: 31 mg/dL — ABNORMAL HIGH (ref 8–23)
CO2: 25 mmol/L (ref 22–32)
Calcium: 8.5 mg/dL — ABNORMAL LOW (ref 8.9–10.3)
Chloride: 106 mmol/L (ref 98–111)
Creatinine, Ser: 1.79 mg/dL — ABNORMAL HIGH (ref 0.44–1.00)
GFR, Estimated: 26 mL/min — ABNORMAL LOW (ref >60)
Glucose, Bld: 165 mg/dL — ABNORMAL HIGH (ref 70–99)
Potassium: 4 mmol/L (ref 3.5–5.1)
Sodium: 140 mmol/L (ref 135–145)

## 2021-12-26 LAB — GLUCOSE, CAPILLARY
Glucose-Capillary: 154 mg/dL — ABNORMAL HIGH (ref 70–99)
Glucose-Capillary: 236 mg/dL — ABNORMAL HIGH (ref 70–99)
Glucose-Capillary: 126 mg/dL — ABNORMAL HIGH (ref 70–99)
Glucose-Capillary: 125 mg/dL — ABNORMAL HIGH (ref 70–99)

## 2021-12-26 MED ORDER — SODIUM CHLORIDE 0.9 % IV SOLN: INTRAVENOUS | Status: DC | PRN

## 2021-12-26 NOTE — Progress Notes (Signed)
Progress Note   Patient: Nancy Medina ZOX:[id number] DOB: 06/21/1925 DOA: 12/25/2021     0 DOS: the patient was seen and examined on 12/26/2021   Brief admission narrative: Nancy Medina is a 88 y.o. female with medical history significant of type 2 diabetes mellitus, chronic kidney disease stage IV, hypertension, hyperlipidemia, history of stroke, memory loss/early dementia, gastroesophageal reflux disease and glaucoma; who presented to the hospital secondary to altered mental status.  Patient at baseline is confused but talkative unable to follow commands and with assistance performing activities of daily living.  She lives at [facility] skilled nursing facility.  On the day she presented to the emergency department patient was not able to answer questions, function as she normally will do and demonstrated out of proportion generalized weakness.  There has not been any chest pain, cough, fever, nausea, vomiting, hematuria, melena, hematochezia, sick contacts or any other complaints.     Given patient confusion she is limited to provide any further information currently.   Per records patient is vaccinated against COVID but no boosted; COVID PCR in the ED negative.   In ED: Chest x-ray demonstrated no acute cardiopulmonary process, CT head without acute intracranial abnormalities once again demonstrated all previous nonhemorrhagic stroke affecting left parietal occipital area and lacunar infarcts in her cerebellar hemispheres. Urinalysis suggesting the presence of acute infection, with marked leukocyte esterase and many bacteria. Normal WBCs and currently afebrile; lactic acid within normal limits. Patient received IV fluids, cultures were taken and patient started on IV ceftriaxone.  TR[facility] has been contacted to place in the hospital for further evaluation and management. Assessment and Plan: * Acute encephalopathy- (present on admission) -In the setting of metabolic encephalopathy from  UTI. -Improved after fluid resuscitation and antibiotics -Mentation appears to be back to baseline. -Patient with underlying cognitive impairment/dementia. -Continue constant reorientation; maintain adequate hydration and follow culture results. -Will continue treatment with current antibiotics.  Hypokalemia- (present on admission) -Magnesium within normal limits. -Potassium has been repleted and is stable currently -Continue to follow electrolytes trend.   CKD (chronic kidney disease) stage 4, GFR 15-29 ml/min (HCC)- (present on admission) -Patient presented with acute kidney injury and chronic stage IV renal failure. -Most likely in the setting of prerenal azotemia and UTI. -Continue to maintain adequate hydration -Continue treatment for UTI -Creatinine down to 1.79 currently.  Antibiotics and follow culture results. -Continue avoiding nephrotoxic agents, hypotension  History of ischemic stroke -No new focal deficits appreciated. -Continue risk factor modifications and aspirin for secondary prevention. -CT scan has demonstrated no acute intracranial abnormalities.  GERD- (present on admission) -Continue the use of famotidine and PPI. -Patient denies any GI complaints at this time.  Essential hypertension- (present on admission) -Resume home hydralazine and amlodipine. -Heart healthy diet has been ordered. -Follow vital signs and further adjust antihypertensive treatment as required. -Blood pressure overall stable.  Unspecified glaucoma- (present on admission) -Resume home eyedrops regimen. -Continue outpatient follow-up with ophthalmology service.  Depression- (present on admission) -Continue the use of Remeron. -Mood is overall stable at this time. -No hallucinations.  Type 2 diabetes mellitus with hyperlipidemia ([place])- (present on admission) -A1c 6.6 -Continue holding oral hypoglycemic agents while inpatient. -Started on sliding scale insulin and will follow CBGs  fluctuation to further adjust management.   Subjective:  In no acute distress.  Denies nausea, vomiting, chest pain and shortness of breath.  Patient is afebrile.  Tolerating diet with improved mentation (essentially back to baseline).  In good spirit and very  conversant.  Physical Exam: Vitals:   12/25/21 1600 12/25/21 2000 12/26/21 0525 12/26/21 1350  BP: (!) 175/96 (!) 154/79 (!) 156/81 129/65  Pulse: 82 84 70 63  Resp: 20 20  18   Temp: 98.2 F (36.8 C) 98.8 F (37.1 C) (!) 97.2 F (36.2 C) 98.7 F (37.1 C)  TempSrc: Oral Oral  Oral  SpO2: 98% 96% 98% 98%  Weight: 71.9 kg     Height: 5\' 4"  (1.626 m)      General exam: Alert, awake, oriented to person and place (able to state that she was at the hospital); mentation appears to be back to baseline.  In good spirit and conversant.  No fever Respiratory system: Clear to auscultation. Respiratory effort normal.  No using accessory muscle.  Good saturation on room air. Cardiovascular system:RRR. No murmurs, rubs, gallops.  No JVD. Gastrointestinal system: Abdomen is nondistended, soft and nontender. No organomegaly or masses felt. Normal bowel sounds heard. Central nervous system: Following commands appropriately; moving 4 limbs spontaneously.  No new focal deficits appreciated. Extremities: No cyanosis or clubbing. Skin: No petechiae. Psychiatry: Judgement and insight appear at baseline; stable mood.   Data Reviewed: Basic metabolic panel demonstrating normal potassium, normal sodium and improved creatinine back to baseline with a level of 1.79.  Family Communication: No family at bedside.  Disposition: Status is: Observation The patient remains OBS appropriate and will d/c before 2 midnights.  Planned Discharge Destination: Back to her ALF Nancy Medina).;  Patient with significant improvement and medical stability to return back to assisted living facility today; unfortunately her facility not capable to get her back due to  staffing.  Will anticipate discharge back to her on 12/27/2021.  Author: Barton Dubois, MD 12/26/2021 3:23 PM  For on call review [email or web address].

## 2021-12-26 NOTE — TOC Initial Note (Signed)
Transition of Care [facility]) - Initial/Assessment Note    Patient Details  Name: Nancy Medina MRN: [id number] Date of Birth: 1925-02-28  Transition of Care [facility]) CM/SW Contact:    Servando Snare, LCSW Phone Number: 12/26/2021, 11:24 AM  Clinical Narrative:    Patient ready for dc. LCSW called facility to confirm patient return. Facility reports patient cannot return over the weekend due to staffing and can return on Monday.               Expected Discharge Plan: Assisted Living Barriers to Discharge: Other (must enter comment) (Facilty will not accept patient over the weekend due to staffing. Patient can dc tomorrow.)   Patient Goals and CMS Choice     Choice offered to / list presented to : NA  Expected Discharge Plan and Services Expected Discharge Plan: Assisted Living In-house Referral: NA Discharge Planning Services: NA Post Acute Care Choice: NA Living arrangements for the past 2 months: [place]                   DME Agency: NA       HH Arranged: NA [place] Agency: NA        Prior Living Arrangements/Services Living arrangements for the past 2 months: [place] Lives with:: Facility Resident Patient language and need for interpreter reviewed:: Yes Do you feel safe going back to the place where you live?: Yes      Need for Family Participation in Patient Care: No (Comment) Care giver support system in place?: Yes (comment)   Criminal Activity/Legal Involvement Pertinent to Current Situation/Hospitalization: No - Comment as needed  Activities of Daily Living      Permission Sought/Granted Permission sought to share information with : Family Supports                Emotional Assessment Appearance:: Appears stated age Attitude/Demeanor/Rapport: Unable to Assess Affect (typically observed): Unable to Assess Orientation: : Oriented to Self, Oriented to Place Alcohol / Substance Use: Never Used Psych Involvement: No  (comment)  Admission diagnosis:  Acute encephalopathy [G93.40] Generalized weakness [R53.1] Urinary tract infection without hematuria, site unspecified [N39.0] Altered mental status, unspecified altered mental status type [R41.82] Patient Active Problem List   Diagnosis Date Noted   Acute encephalopathy 12/25/2021   Hypokalemia 82/42/3536   Acute metabolic encephalopathy 14/43/1540   Metabolic encephalopathy 08/67/6195   Altered mental state 07/27/2021   Altered mental status 07/27/2021   Hypertension associated with stage 4 chronic kidney disease due to type 2 diabetes mellitus ([place]) 05/01/2020   Hyperlipidemia associated with type 2 diabetes mellitus ([place]) 05/01/2020   CKD stage 4 due to type 2 diabetes mellitus ([place]) 05/01/2020   Acute blood loss anemia 05/01/2020   Bilateral lower extremity edema 05/01/2020   Increased intraocular pressure, bilateral 05/01/2020   Chronic constipation 05/01/2020   Localized edema 05/01/2020   Displaced intertrochanteric fracture of left femur, subsequent encounter for closed fracture with routine healing 04/27/2020   Abnormal gait 09/13/2018   Mild cognitive impairment 09/13/2018   Uncontrolled hypertension 07/06/2018   Fall 07/04/2018   Scalp laceration 07/04/2018   Acute bronchitis due to Rhinovirus 03/20/2018   Diabetic hyperosmolar non-ketotic state ([place]) 03/18/2018   Acute respiratory failure with hypoxia ([place]) 03/18/2018   CKD (chronic kidney disease) stage 4, GFR 15-29 ml/min ([place]) 03/18/2018   Chronic kidney disease, stage 4 (severe) ([place]) 03/18/2018   Type 2 diabetes mellitus with hyperosmolarity without nonketotic hyperglycemic-hyperosmolar coma [facility]) ([place]) 03/18/2018  Acute lower UTI 02/09/2018   Nausea 02/09/2018   Constipation 02/09/2018   Diarrhea 11/25/2011   Lymphocytic colitis 06/15/2011   Esophageal dysphagia 06/15/2011   UNSPECIFIED ANEMIA 04/27/2009   Anemia 04/27/2009   ACHILLES TENDINITIS 10/27/2008   PERIPHERAL  EDEMA 10/14/2008   Peripheral edema 10/14/2008   DEGENERATIVE [place] DISEASE 01/15/2008   Type 2 diabetes mellitus with hyperlipidemia ([place]) 12/13/2007   Type 2 diabetes mellitus with other specified complication ([place]) 76/72/0947   Hyperlipidemia 10/11/2006   Obesity 10/11/2006   Anxiety 10/11/2006   Depression 10/11/2006   Unspecified glaucoma 10/11/2006   BENIGN POSITIONAL VERTIGO 10/11/2006   Essential hypertension 10/11/2006   GERD 10/11/2006   Overactive bladder 10/11/2006   Osteoporosis 10/11/2006   BREAST CANCER, HX OF 10/11/2006   History of ischemic stroke 10/11/2006   Depressive disorder 10/11/2006   PCP:  Vilinda Boehringer, NP Pharmacy:   [facility] [place] [facility] [address] [place] [address] Phone: [phone] Fax: [phone]  [facility] - [place], [place] - [address] [place] [address] Phone: [phone] Fax: [phone]     Social Determinants of Health (SDOH) Interventions    Readmission Risk Interventions No flowsheet data found.

## 2021-12-26 NOTE — Progress Notes (Signed)
Assisted up to bedside commode earlier and voided and had bm.  Sat in chair for most of afternoon with chair alarm activated.  Confused at baseline , knows name and can talk about her children and her past but thinks that she is back at [facility] and doesn't understand to use call light before getting up.  Assisted back to bed and bed alarm set.

## 2021-12-27 DIAGNOSIS — N179 Acute kidney failure, unspecified: Secondary | ICD-10-CM

## 2021-12-27 DIAGNOSIS — K219 Gastro-esophageal reflux disease without esophagitis: Secondary | ICD-10-CM | POA: Diagnosis not present

## 2021-12-27 DIAGNOSIS — G9340 Encephalopathy, unspecified: Secondary | ICD-10-CM | POA: Diagnosis not present

## 2021-12-27 DIAGNOSIS — F32A Depression, unspecified: Secondary | ICD-10-CM | POA: Diagnosis not present

## 2021-12-27 DIAGNOSIS — N184 Chronic kidney disease, stage 4 (severe): Secondary | ICD-10-CM | POA: Diagnosis not present

## 2021-12-27 LAB — BASIC METABOLIC PANEL
Anion gap: 7 (ref 5–15)
BUN: 27 mg/dL — ABNORMAL HIGH (ref 8–23)
CO2: 26 mmol/L (ref 22–32)
Calcium: 8.6 mg/dL — ABNORMAL LOW (ref 8.9–10.3)
Chloride: 107 mmol/L (ref 98–111)
Creatinine, Ser: 1.6 mg/dL — ABNORMAL HIGH (ref 0.44–1.00)
GFR, Estimated: 30 mL/min — ABNORMAL LOW (ref >60)
Glucose, Bld: 148 mg/dL — ABNORMAL HIGH (ref 70–99)
Potassium: 3.8 mmol/L (ref 3.5–5.1)
Sodium: 140 mmol/L (ref 135–145)

## 2021-12-27 LAB — GLUCOSE, CAPILLARY
Glucose-Capillary: 132 mg/dL — ABNORMAL HIGH (ref 70–99)
Glucose-Capillary: 220 mg/dL — ABNORMAL HIGH (ref 70–99)

## 2021-12-27 MED ORDER — FUROSEMIDE 20 MG PO TABS: 20.0000 mg | ORAL_TABLET | ORAL | Status: DC

## 2021-12-27 MED ORDER — CEFDINIR 300 MG PO CAPS: 300.0000 mg | ORAL_CAPSULE | Freq: Two times a day (BID) | ORAL | 0 refills | Status: AC

## 2021-12-27 MED ORDER — PANTOPRAZOLE SODIUM 40 MG PO TBEC: 40.0000 mg | DELAYED_RELEASE_TABLET | Freq: Every day | ORAL | 0 refills | Status: DC

## 2021-12-27 NOTE — Progress Notes (Signed)
Report called to [place] at [place].  IV removed and apresoline 50 mg given a little early for bp 171/91.  Daughter has been at bedside and to transport patient to facility.  Discharge instructions reviewed with daughter and Sharyn Lull.  Two hard scripts in envelope

## 2021-12-27 NOTE — Assessment & Plan Note (Signed)
-  Secondary to Klebsiella pneumoniae microorganism -Patient with excellent response to the use of ceftriaxone -After 48 hours of IV antibiotics patient mentation back to baseline, no fever, normal WBCs and no complaints of dysuria. -3 more days of oral cefdinir recommended at discharge to complete antibiotic therapy. -Patient advised to maintain adequate hydration.

## 2021-12-27 NOTE — NC FL2 (Signed)
[place] LEVEL OF CARE SCREENING TOOL     IDENTIFICATION  Patient Name: Nancy Medina Birthdate: 07/26/1925 Sex: female Admission Date (Current Location): 12/25/2021  [facility] and [place] Number:  [employer] and Address:  [facility] [address], [place]      Provider Number: [id number]  Attending Physician Name and Address:  Barton Dubois, MD  Relative Name and Phone Number:  Mickie Kay (Daughter)   [phone]    Current Level of Care: Hospital (observation) Recommended Level of Care: Assisted Living Facility Prior Approval Number:    Date Approved/Denied:   PASRR Number:    Discharge Plan: Domiciliary (Rest home)    Current Diagnoses: Patient Active Problem List   Diagnosis Date Noted   Acute encephalopathy 12/25/2021   Hypokalemia 50/27/7412   Acute metabolic encephalopathy 87/86/7672   Metabolic encephalopathy 09/47/0962   Altered mental state 07/27/2021   Altered mental status 07/27/2021   Hypertension associated with stage 4 chronic kidney disease due to type 2 diabetes mellitus ([place]) 05/01/2020   Hyperlipidemia associated with type 2 diabetes mellitus ([place]) 05/01/2020   CKD stage 4 due to type 2 diabetes mellitus ([place]) 05/01/2020   Acute blood loss anemia 05/01/2020   Bilateral lower extremity edema 05/01/2020   Increased intraocular pressure, bilateral 05/01/2020   Chronic constipation 05/01/2020   Localized edema 05/01/2020   Displaced intertrochanteric fracture of left femur, subsequent encounter for closed fracture with routine healing 04/27/2020   Abnormal gait 09/13/2018   Mild cognitive impairment 09/13/2018   Uncontrolled hypertension 07/06/2018   Fall 07/04/2018   Scalp laceration 07/04/2018   Acute bronchitis due to Rhinovirus 03/20/2018   Diabetic hyperosmolar non-ketotic state ([place]) 03/18/2018   Acute respiratory failure with hypoxia ([place]) 03/18/2018   CKD (chronic kidney disease) stage  4, GFR 15-29 ml/min (HCC) 03/18/2018   Chronic kidney disease, stage 4 (severe) ([place]) 03/18/2018   Type 2 diabetes mellitus with hyperosmolarity without nonketotic hyperglycemic-hyperosmolar coma [facility]) ([place]) 03/18/2018   Urinary tract infection without hematuria 02/09/2018   Nausea 02/09/2018   Constipation 02/09/2018   Diarrhea 11/25/2011   Lymphocytic colitis 06/15/2011   Esophageal dysphagia 06/15/2011   UNSPECIFIED ANEMIA 04/27/2009   Anemia 04/27/2009   ACHILLES TENDINITIS 10/27/2008   PERIPHERAL EDEMA 10/14/2008   Peripheral edema 10/14/2008   DEGENERATIVE DISC DISEASE 01/15/2008   Type 2 diabetes mellitus with hyperlipidemia ([place]) 12/13/2007   Type 2 diabetes mellitus with other specified complication ([place]) 83/66/2947   Hyperlipidemia 10/11/2006   Obesity 10/11/2006   Anxiety 10/11/2006   Depression 10/11/2006   Unspecified glaucoma 10/11/2006   BENIGN POSITIONAL VERTIGO 10/11/2006   Essential hypertension 10/11/2006   GERD 10/11/2006   Overactive bladder 10/11/2006   Osteoporosis 10/11/2006   BREAST CANCER, HX OF 10/11/2006   History of ischemic stroke 10/11/2006   Depressive disorder 10/11/2006    Orientation RESPIRATION BLADDER Height & Weight     Self  Normal Incontinent Weight: 158 lb 8.2 oz (71.9 kg) Height:  5\' 4"  (162.6 cm)  BEHAVIORAL SYMPTOMS/MOOD NEUROLOGICAL BOWEL NUTRITION STATUS      Continent  (soft heart healthy/modified carbohydrate diet)  AMBULATORY STATUS COMMUNICATION OF NEEDS Skin   Limited Assist   Normal                       Personal Care Assistance Level of Assistance  Bathing, Feeding, Dressing Bathing Assistance: Limited assistance Feeding assistance: Independent Dressing Assistance: Limited assistance     Functional  Limitations Info  Sight, Hearing, Speech Sight Info: Adequate Hearing Info: Adequate Speech Info: Adequate    SPECIAL CARE FACTORS FREQUENCY                       Contractures      Additional  Factors Info  Code Status, Allergies, Psychotropic Code Status Info: DNR Allergies Info: Metformin Psychotropic Info: (P) Klonopin         Current Medications (12/27/2021):  This is the current hospital active medication list Current Facility-Administered Medications  Medication Dose Route Frequency Provider Last Rate Last Admin   0.9 %  sodium chloride infusion   Intravenous PRN Barton Dubois, MD 10 mL/hr at 12/26/21 0920 New Bag at 12/26/21 0920   amLODipine (NORVASC) tablet 2.5 mg  2.5 mg Oral Daily Barton Dubois, MD   2.5 mg at 12/27/21 1004   aspirin chewable tablet 81 mg  81 mg Oral Q breakfast Barton Dubois, MD   81 mg at 12/27/21 1006   brimonidine (ALPHAGAN) 0.2 % ophthalmic solution 1 drop  1 drop Both Eyes Q8H Barton Dubois, MD   1 drop at 12/26/21 2101   cefTRIAXone (ROCEPHIN) 1 g in sodium chloride 0.9 % 100 mL IVPB  1 g Intravenous Q24H Barton Dubois, MD 200 mL/hr at 12/27/21 1004 1 g at 12/27/21 1004   cycloSPORINE (RESTASIS) 0.05 % ophthalmic emulsion 1 drop  1 drop Both Eyes BID Barton Dubois, MD   1 drop at 12/27/21 1004   dorzolamide-timolol (COSOPT) 22.3-6.8 MG/ML ophthalmic solution 1 drop  1 drop Both Eyes BID Barton Dubois, MD   1 drop at 12/27/21 1004   enoxaparin (LOVENOX) injection 30 mg  30 mg Subcutaneous Q24H Barton Dubois, MD   30 mg at 12/26/21 2109   famotidine (PEPCID) tablet 20 mg  20 mg Oral Daily Barton Dubois, MD   20 mg at 12/27/21 1004   feeding supplement (ENSURE ENLIVE / ENSURE PLUS) liquid 237 mL  237 mL Oral BID BM Barton Dubois, MD   237 mL at 12/26/21 1536   hydrALAZINE (APRESOLINE) tablet 50 mg  50 mg Oral TID Barton Dubois, MD   50 mg at 12/27/21 1005   insulin aspart (novoLOG) injection 0-5 Units  0-5 Units Subcutaneous QHS Barton Dubois, MD   2 Units at 12/25/21 2151   insulin aspart (novoLOG) injection 0-6 Units  0-6 Units Subcutaneous TID [facility] Barton Dubois, MD   2 Units at 12/27/21 1303   lactated ringers infusion   Intravenous  Continuous Barton Dubois, MD 75 mL/hr at 12/26/21 1750 New Bag at 12/26/21 1750   mirtazapine (REMERON) tablet 7.5 mg  7.5 mg Oral QHS Barton Dubois, MD   7.5 mg at 12/26/21 2109   pantoprazole (PROTONIX) EC tablet 40 mg  40 mg Oral Daily Barton Dubois, MD   40 mg at 12/27/21 1004   vitamin B-12 (CYANOCOBALAMIN) tablet 1,000 mcg  1,000 mcg Oral Daily Barton Dubois, MD   1,000 mcg at 12/27/21 1004     Discharge Medications: TAKE these medications     acetaminophen 325 MG tablet Commonly known as: TYLENOL Take 2 tablets (650 mg total) by mouth every 6 (six) hours as needed for mild pain (or Fever >/= 101).    amLODipine 2.5 MG tablet Commonly known as: NORVASC Take 2.5 mg by mouth daily.    aspirin 81 MG tablet Take 1 tablet (81 mg total) by mouth daily with breakfast.    brimonidine 0.2 % ophthalmic solution  Commonly known as: ALPHAGAN Place 1 drop into both eyes every 8 (eight) hours.    cefdinir 300 MG capsule Commonly known as: OMNICEF Take 1 capsule (300 mg total) by mouth 2 (two) times daily for 3 days.    clonazePAM 0.5 MG tablet Commonly known as: KLONOPIN Take 0.5 mg by mouth at bedtime.    cycloSPORINE 0.05 % ophthalmic emulsion Commonly known as: RESTASIS Place 1 drop into both eyes 2 (two) times daily.    dorzolamide-timolol 22.3-6.8 MG/ML ophthalmic solution Commonly known as: COSOPT Place 1 drop into both eyes in the morning and at bedtime.    famotidine 20 MG tablet Commonly known as: PEPCID Take 20 mg by mouth daily.    feeding supplement Liqd Take 237 mLs by mouth 2 (two) times daily between meals.    furosemide 20 MG tablet Commonly known as: LASIX Take 1 tablet (20 mg total) by mouth every other day. What changed: when to take this    hydrALAZINE 50 MG tablet Commonly known as: APRESOLINE Take 1 tablet (50 mg total) by mouth 3 (three) times daily.    loperamide 2 MG tablet Commonly known as: IMODIUM A-D Take 2 mg by mouth 4 (four) times  daily as needed for diarrhea or loose stools.    LORazepam 0.5 MG tablet Commonly known as: ATIVAN Take 1 tablet (0.5 mg total) by mouth daily as needed for anxiety.    mirtazapine 7.5 MG tablet Commonly known as: REMERON Take 7.5 mg by mouth at bedtime.    pantoprazole 40 MG tablet Commonly known as: PROTONIX Take 1 tablet (40 mg total) by mouth daily. Start taking on: December 28, 2021    [place] 0.02-0.005 % Soln Generic drug: Netarsudil-Latanoprost Place 1 drop into both eyes at bedtime.    sitaGLIPtin 25 MG tablet Commonly known as: Januvia Take 1 tablet (25 mg total) by mouth daily.    vitamin B-12 1000 MCG tablet Commonly known as: CYANOCOBALAMIN Take 1 tablet (1,000 mcg total) by mouth daily.    Vitamin D 50 MCG (2000 UT) Caps Take 1 capsule by mouth daily.           Relevant Imaging Results:  Relevant Lab Results:   Additional Information    Darian Cansler, Clydene Pugh, LCSW

## 2021-12-27 NOTE — Discharge Summary (Signed)
Physician Discharge Summary   Patient: Nancy Medina MRN: [id number] DOB: [birth year]  Admit date:     12/25/2021  Discharge date: 12/27/21  Discharge Physician: Barton Dubois   PCP: Vilinda Boehringer, NP   Recommendations at discharge:  Repeat basic metabolic panel to follow electrolytes and renal function. Assess complete resolution of patient's UTI infection. Reassess blood pressure and adjust antihypertensive treatment as required.  Discharge Diagnoses: Klebsiella pneumonitis urinary tract infection without hematuria. Acute encephalopathy Type 2 diabetes mellitus with hyperlipidemia (HCC) Depression Unspecified glaucoma Essential hypertension GERD History of ischemic stroke Acute kidney injury on CKD (chronic kidney disease) stage 4, GFR 15-29 ml/min (HCC) Hypokalemia  Brief admission narrative: Nancy Medina is a 88 y.o. female with medical history significant of type 2 diabetes mellitus, chronic kidney disease stage IV, hypertension, hyperlipidemia, history of stroke, memory loss/early dementia, gastroesophageal reflux disease and glaucoma; who presented to the hospital secondary to altered mental status.  Patient at baseline is confused but talkative unable to follow commands and with assistance performing activities of daily living.  She lives at [facility].  On the day she presented to the emergency department patient was not able to answer questions, function as she normally will do and demonstrated out of proportion generalized weakness.  There has not been any chest pain, cough, fever, nausea, vomiting, hematuria, melena, hematochezia, sick contacts or any other complaints.     Given patient confusion she is limited to provide any further information currently.   Per records patient is vaccinated against COVID but no boosted; COVID PCR in the ED negative.   In ED: Chest x-ray demonstrated no acute cardiopulmonary process, CT head without acute  intracranial abnormalities once again demonstrated all previous nonhemorrhagic stroke affecting left parietal occipital area and lacunar infarcts in her cerebellar hemispheres. Urinalysis suggesting the presence of acute infection, with marked leukocyte esterase and many bacteria. Normal WBCs and currently afebrile; lactic acid within normal limits. Patient received IV fluids, cultures were taken and patient started on IV ceftriaxone.  TRH has been contacted to place in the hospital for further evaluation and management.   Assessment and Plan: * Acute encephalopathy- (present on admission) -In the setting of metabolic encephalopathy from UTI. -Improved after fluid resuscitation and antibiotics -Mentation appears to be back to baseline. -Patient with underlying cognitive impairment/dementia. -Continue constant reorientation; maintain adequate hydration and completed treatment for UTI.   Hypokalemia- (present on admission) -Magnesium within normal limits. -Potassium has been repleted and is stable at time of discharge. -Repeat basic metabolic panel to evaluate electrolytes trend/stability.   CKD (chronic kidney disease) stage 4, GFR 15-29 ml/min (HCC)- (present on admission) -Patient presented with acute kidney injury on chronic stage IV renal failure. -Most likely in the setting of prerenal azotemia and UTI. -Improve and back to baseline at time of discharge. -Continue to maintain adequate hydration -Continue treatment for UTI -Repeat basic metabolic panel follow-up visit to assess electrolytes and renal function.  Urinary tract infection without hematuria -Secondary to Klebsiella pneumoniae microorganism -Patient with excellent response to the use of ceftriaxone -After 48 hours of IV antibiotics patient mentation back to baseline, no fever, normal WBCs and no complaints of dysuria. -3 more days of oral cefdinir recommended at discharge to complete antibiotic therapy. -Patient  advised to maintain adequate hydration.  History of ischemic stroke -No new focal deficits appreciated. -Continue risk factor modifications and aspirin for secondary prevention. -CT scan has demonstrated no acute intracranial abnormalities.  GERD- (present on admission) -Continue  the use of famotidine and PPI. -Patient denies any GI complaints at this time.  Essential hypertension- (present on admission) -Resume home hydralazine and amlodipine. -Heart healthy diet has been ordered. -Follow vital signs and further adjust antihypertensive treatment as required. -Blood pressure overall stable. -Heart healthy/low-sodium diet recommended.  Unspecified glaucoma- (present on admission) -Resume home eyedrops regimen. -Continue outpatient follow-up with ophthalmology service.  Depression- (present on admission) -Continue the use of Remeron. -Mood is overall stable at this time. -No hallucinations or suicidal ideation..  Type 2 diabetes mellitus with hyperlipidemia ([facility])- (present on admission) -A1c 6.6 -Continue to follow oral hypoglycemic agents at discharge; watch the amount of carbohydrates in patient's diet. -Maintain adequate hydration.   Consultants: None Procedures performed: See below for x-ray reports. Disposition: Patient will return back to assisted living facility.  Diet recommendation: Soft heart healthy/modified carbohydrate diet.   DISCHARGE MEDICATION: Allergies as of 12/27/2021       Reactions   Metformin    This allergy is not listed under records with [facility] facility. Past records states that patient "felt poor" as a reaction to taking this medication        Medication List     STOP taking these medications    NON FORMULARY   polyethylene glycol 17 g packet Commonly known as: MIRALAX / GLYCOLAX       TAKE these medications    acetaminophen 325 MG tablet Commonly known as: TYLENOL Take 2 tablets (650 mg total) by mouth every 6 (six)  hours as needed for mild pain (or Fever >/= 101).   amLODipine 2.5 MG tablet Commonly known as: NORVASC Take 2.5 mg by mouth daily.   aspirin 81 MG tablet Take 1 tablet (81 mg total) by mouth daily with breakfast.   brimonidine 0.2 % ophthalmic solution Commonly known as: ALPHAGAN Place 1 drop into both eyes every 8 (eight) hours.   cefdinir 300 MG capsule Commonly known as: OMNICEF Take 1 capsule (300 mg total) by mouth 2 (two) times daily for 3 days.   clonazePAM 0.5 MG tablet Commonly known as: KLONOPIN Take 0.5 mg by mouth at bedtime.   cycloSPORINE 0.05 % ophthalmic emulsion Commonly known as: RESTASIS Place 1 drop into both eyes 2 (two) times daily.   dorzolamide-timolol 22.3-6.8 MG/ML ophthalmic solution Commonly known as: COSOPT Place 1 drop into both eyes in the morning and at bedtime.   famotidine 20 MG tablet Commonly known as: PEPCID Take 20 mg by mouth daily.   feeding supplement Liqd Take 237 mLs by mouth 2 (two) times daily between meals.   furosemide 20 MG tablet Commonly known as: LASIX Take 1 tablet (20 mg total) by mouth every other day. What changed: when to take this   hydrALAZINE 50 MG tablet Commonly known as: APRESOLINE Take 1 tablet (50 mg total) by mouth 3 (three) times daily.   loperamide 2 MG tablet Commonly known as: IMODIUM A-D Take 2 mg by mouth 4 (four) times daily as needed for diarrhea or loose stools.   LORazepam 0.5 MG tablet Commonly known as: ATIVAN Take 1 tablet (0.5 mg total) by mouth daily as needed for anxiety.   mirtazapine 7.5 MG tablet Commonly known as: REMERON Take 7.5 mg by mouth at bedtime.   pantoprazole 40 MG tablet Commonly known as: PROTONIX Take 1 tablet (40 mg total) by mouth daily. Start taking on: December 28, 2021   Rocklatan 0.02-0.005 % Soln Generic drug: Netarsudil-Latanoprost Place 1 drop into both eyes at bedtime.  sitaGLIPtin 25 MG tablet Commonly known as: Januvia Take 1 tablet (25 mg  total) by mouth daily.   vitamin B-12 1000 MCG tablet Commonly known as: CYANOCOBALAMIN Take 1 tablet (1,000 mcg total) by mouth daily.   Vitamin D 50 MCG (2000 UT) Caps Take 1 capsule by mouth daily.        Follow-up Information     Vilinda Boehringer, NP. Schedule an appointment as soon as possible for a visit in 10 day(s).   Contact information: [place] [address] [phone]                 Discharge Exam: Danley Danker Weights   12/25/21 0735 12/25/21 1600  Weight: 67.1 kg 71.9 kg   General exam: Alert, awake, oriented to person and place (able to state that she was at the hospital); mentation appears to be back to baseline.  In good spirit and conversant.  No fever Respiratory system: Clear to auscultation. Respiratory effort normal.  No using accessory muscle.  Good saturation on room air. Cardiovascular system:RRR. No murmurs, rubs, gallops.  No JVD. Gastrointestinal system: Abdomen is nondistended, soft and nontender. No organomegaly or masses felt. Normal bowel sounds heard. Central nervous system: Following commands appropriately; moving 4 limbs spontaneously.  No new focal deficits appreciated. Extremities: No cyanosis or clubbing. Skin: No petechiae. Psychiatry: Judgement and insight appear at baseline; stable mood.  Condition at discharge: Stable and improved.  The results of significant diagnostics from this hospitalization (including imaging, microbiology, ancillary and laboratory) are listed below for reference.   Imaging Studies: CT ABDOMEN PELVIS WO CONTRAST  Result Date: 12/25/2021 CLINICAL DATA:  Urinary tract infection, recurrent/complicated. EXAM: CT ABDOMEN AND PELVIS WITHOUT CONTRAST TECHNIQUE: Multidetector CT imaging of the abdomen and pelvis was performed following the standard protocol without IV contrast. RADIATION DOSE REDUCTION: This exam was performed according to the departmental dose-optimization program which includes  automated exposure control, adjustment of the mA and/or kV according to patient size and/or use of iterative reconstruction technique. COMPARISON:  CT pelvis dated November 18, 2021 FINDINGS: Lower chest: Bibasilar atelectasis. Coronary artery atherosclerotic calcifications. No acute abnormality. Hepatobiliary: No focal liver abnormality is seen. Status post cholecystectomy. No intrahepatic biliary dilatation. Prominence of the common duct, likely secondary to post cholecystectomy status. Pancreas: Moderate pancreatic atrophy. No pancreatic ductal dilatation or surrounding inflammatory changes. Spleen: Normal in size without focal abnormality. Adrenals/Urinary Tract: Adrenal glands are unremarkable. Bilateral renal cortical atrophy. Punctate calculi versus calcifications. No evidence of hydronephrosis or ureteral calculus. Bladder is unremarkable. Stomach/Bowel: Stomach is within normal limits. Appendix not visualized. Marked sigmoid colonic diverticulosis without evidence of acute diverticulitis. Vascular/Lymphatic: Aorta is tortuous with atherosclerotic disease. No enlarged abdominal or pelvic lymph nodes. Reproductive: Status post hysterectomy. No adnexal masses. Other: No abdominal wall hernia or abnormality. No abdominopelvic ascites. Musculoskeletal: Cephalomedullary nail placement of the left femur. Advanced degenerate disc disease of the lumbar spine. Bone island in the right femoral head. No acute osseous abnormality. IMPRESSION: 1. Punctate calculi and/or calcification in bilateral kidneys. No evidence of hydronephrosis or ureteral calculus. Renal cortical atrophy. Urinary bladder is unremarkable. Urinalysis could be obtained if clinical concern for urinary tract infection. 2. Marked colonic diverticulosis without evidence of acute diverticulitis. 3. Advanced degenerate disc disease of the lumbar spine. Left femoral cephalomedullary nail placement. 4.  Additional chronic findings as above. Electronically  Signed   By: Keane Police D.O.   On: 12/25/2021 10:38   CT HEAD WO CONTRAST  Result Date: 12/25/2021  CLINICAL DATA:  Mental status changes. EXAM: CT HEAD WITHOUT CONTRAST TECHNIQUE: Contiguous axial images were obtained from the base of the skull through the vertex without intravenous contrast. RADIATION DOSE REDUCTION: This exam was performed according to the departmental dose-optimization program which includes automated exposure control, adjustment of the mA and/or kV according to patient size and/or use of iterative reconstruction technique. COMPARISON:  11/18/2021 FINDINGS: Brain: There is no evidence for acute hemorrhage, hydrocephalus, mass lesion, or abnormal extra-axial fluid collection. No definite CT evidence for acute infarction. Diffuse loss of parenchymal volume is consistent with atrophy. Patchy low attenuation in the deep hemispheric and periventricular white matter is nonspecific, but likely reflects chronic microvascular ischemic demyelination. Old left parietooccipital infarct again noted. Lacunar infarcts noted in the cerebellar hemispheres bilaterally. Vascular: No hyperdense vessel or unexpected calcification. Skull: No evidence for fracture. No worrisome lytic or sclerotic lesion. Sinuses/Orbits: The visualized paranasal sinuses and mastoid air cells are clear. Visualized portions of the globes and intraorbital fat are unremarkable. Other: None. IMPRESSION: 1. No acute intracranial abnormality. 2. Atrophy with chronic small vessel ischemic disease. 3. Old left parietooccipital infarct with lacunar infarcts in the cerebellar hemispheres again noted. Electronically Signed   By: Misty Stanley M.D.   On: 12/25/2021 08:46   DG Chest Port 1 View  Result Date: 12/25/2021 CLINICAL DATA:  Altered mental status. EXAM: PORTABLE CHEST 1 VIEW COMPARISON:  04/27/2020 FINDINGS: 0806 hours. The cardio pericardial silhouette is enlarged. Interstitial markings are diffusely coarsened with chronic  features. Bones are diffusely demineralized. Telemetry leads overlie the chest. IMPRESSION: Chronic interstitial changes without acute cardiopulmonary findings. Electronically Signed   By: Misty Stanley M.D.   On: 12/25/2021 08:21    Microbiology: Results for orders placed or performed during the hospital encounter of 12/25/21  Urine Culture     Status: Abnormal (Preliminary result)   Collection Time: 12/25/21  7:29 AM   Specimen: Urine, Clean Catch  Result Value Ref Range Status   Specimen Description   Final    URINE, CLEAN CATCH Performed at [facility], [address] [place], [place] [address]    Special Requests   Final    NONE Performed at [facility], [address], [place], [place] [address]    Culture (A)  Final    >=100,000 COLONIES/mL KLEBSIELLA PNEUMONIAE SUSCEPTIBILITIES TO FOLLOW Performed at [facility] [address], [place], [place] [address]    Report Status PENDING  Incomplete  Resp Panel by RT-PCR (Flu A&B, Covid) Nasopharyngeal Swab     Status: None   Collection Time: 12/25/21  7:30 AM   Specimen: Nasopharyngeal Swab; Nasopharyngeal(NP) swabs in vial transport medium  Result Value Ref Range Status   SARS Coronavirus 2 by RT PCR NEGATIVE NEGATIVE Final    Comment: (NOTE) SARS-CoV-2 target nucleic acids are NOT DETECTED.  The SARS-CoV-2 RNA is generally detectable in upper respiratory specimens during the acute phase of infection. The lowest concentration of SARS-CoV-2 viral copies this assay can detect is 138 copies/mL. A negative result does not preclude SARS-Cov-2 infection and should not be used as the sole basis for treatment or other patient management decisions. A negative result may occur with  improper specimen collection/handling, submission of specimen other than nasopharyngeal swab, presence of viral mutation(s) within the areas targeted by this assay, and inadequate number of viral copies(<138 copies/mL). A negative result must be  combined with clinical observations, patient history, and epidemiological information. The expected result is Negative.  Fact Sheet for  Patients:  EntrepreneurPulse.com.au  Fact Sheet for Healthcare Providers:  IncredibleEmployment.be  This test is no t yet approved or cleared by the [place] FDA and  has been authorized for detection and/or diagnosis of SARS-CoV-2 by FDA under an Emergency Use Authorization (EUA). This EUA will remain  in effect (meaning this test can be used) for the duration of the COVID-19 declaration under Section 564(b)(1) of the Act, 21 U.S.C.section 360bbb-3(b)(1), unless the authorization is terminated  or revoked sooner.       Influenza A by PCR NEGATIVE NEGATIVE Final   Influenza B by PCR NEGATIVE NEGATIVE Final    Comment: (NOTE) The Xpert Xpress SARS-CoV-2/FLU/RSV plus assay is intended as an aid in the diagnosis of influenza from Nasopharyngeal swab specimens and should not be used as a sole basis for treatment. Nasal washings and aspirates are unacceptable for Xpert Xpress SARS-CoV-2/FLU/RSV testing.  Fact Sheet for Patients: EntrepreneurPulse.com.au  Fact Sheet for Healthcare Providers: IncredibleEmployment.be  This test is not yet approved or cleared by the [place] FDA and has been authorized for detection and/or diagnosis of SARS-CoV-2 by FDA under an Emergency Use Authorization (EUA). This EUA will remain in effect (meaning this test can be used) for the duration of the COVID-19 declaration under Section 564(b)(1) of the Act, 21 U.S.C. section 360bbb-3(b)(1), unless the authorization is terminated or revoked.  Performed at [facility], [address], [place], [place] [address]   Blood Cultures (routine x 2)     Status: None (Preliminary result)   Collection Time: 12/25/21  8:14 AM   Specimen: Left Antecubital; Blood  Result Value Ref Range Status    Specimen Description   Final    LEFT ANTECUBITAL BOTTLES DRAWN AEROBIC AND ANAEROBIC   Special Requests   Final    Blood Culture results may not be optimal due to an excessive volume of blood received in culture bottles   Culture   Final    NO GROWTH < 24 HOURS Performed at [facility], [address], [place], [place] [address]    Report Status PENDING  Incomplete  Blood Cultures (routine x 2)     Status: None (Preliminary result)   Collection Time: 12/25/21  8:17 AM   Specimen: BLOOD LEFT HAND  Result Value Ref Range Status   Specimen Description   Final    BLOOD LEFT HAND BOTTLES DRAWN AEROBIC AND ANAEROBIC   Special Requests   Final    Blood Culture results may not be optimal due to an excessive volume of blood received in culture bottles   Culture   Final    NO GROWTH < 24 HOURS Performed at [facility], [address], [place], [place] [address]    Report Status PENDING  Incomplete    Labs: CBC: Recent Labs  Lab 12/25/21 0815  WBC 8.9  NEUTROABS 6.1  HGB 10.6*  HCT 33.6*  MCV 94.4  PLT 101   Basic Metabolic Panel: Recent Labs  Lab 12/25/21 0815 12/26/21 0444 12/27/21 0438  NA 137 140 140  K 3.0* 4.0 3.8  CL 104 106 107  CO2 25 25 26   GLUCOSE 164* 165* 148*  BUN 40* 31* 27*  CREATININE 2.19* 1.79* 1.60*  CALCIUM 8.6* 8.5* 8.6*  MG 2.0  --   --   PHOS 4.5  --   --    Liver Function Tests: Recent Labs  Lab 12/25/21 0815  AST 13*  ALT 7  ALKPHOS 62  BILITOT 0.6  PROT 6.5  ALBUMIN 3.3*   CBG: Recent Labs  Lab 12/26/21 1141 12/26/21 1643 12/26/21 2100 12/27/21 0739 12/27/21 1216  GLUCAP 236* 126* 125* 132* 220*    Discharge time spent: greater than 30 minutes.  Signed: Barton Dubois, MD [facility] 12/27/2021

## 2021-12-27 NOTE — TOC Transition Note (Signed)
Transition of Care [facility]) - CM/SW Discharge Note   Patient Details  Name: JENILLE LASZLO MRN: [id number] Date of Birth: 23-Jul-1925  [facility] CM/SW Contact:  Ihor Gully, LCSW Phone Number: 12/27/2021, 2:26 PM   Clinical Narrative:    D/c clinicals sent to facility. Stacy at [facility] notified of discharge. Daughter to transport back to facility.    Final next level of care: Assisted Living Barriers to Discharge: No Barriers Identified   Patient Goals and CMS Choice     Choice offered to / list presented to : NA  Discharge Placement                       Discharge Plan and Services In-house Referral: NA Discharge Planning Services: NA Post Acute Care Choice: NA            DME Agency: NA       HH Arranged: NA HH Agency: NA        Social Determinants of Health (SDOH) Interventions     Readmission Risk Interventions No flowsheet data found.

## 2021-12-28 LAB — URINE CULTURE: Culture: >=100000 — AB

## 2021-12-30 LAB — CULTURE, BLOOD (ROUTINE X 2)
Culture: NO GROWTH
Culture: NO GROWTH

## 2021-12-31 ENCOUNTER — Other Ambulatory Visit: Payer: Self-pay

## 2021-12-31 ENCOUNTER — Ambulatory Visit (HOSPITAL_COMMUNITY)
Admission: RE | Admit: 2021-12-31 | Discharge: 2021-12-31 | Disposition: A | Discharge status: Home / Self Care | Payer: PPO | Source: Ambulatory Visit | Attending: Nephrology | Admitting: Nephrology

## 2021-12-31 DIAGNOSIS — R768 Other specified abnormal immunological findings in serum: Secondary | ICD-10-CM | POA: Diagnosis present

## 2021-12-31 DIAGNOSIS — N181 Chronic kidney disease, stage 1: Secondary | ICD-10-CM | POA: Insufficient documentation

## 2021-12-31 DIAGNOSIS — D472 Monoclonal gammopathy: Secondary | ICD-10-CM | POA: Diagnosis present

## 2021-12-31 DIAGNOSIS — E1122 Type 2 diabetes mellitus with diabetic chronic kidney disease: Secondary | ICD-10-CM | POA: Diagnosis not present

## 2021-12-31 DIAGNOSIS — I952 Hypotension due to drugs: Secondary | ICD-10-CM | POA: Insufficient documentation

## 2021-12-31 DIAGNOSIS — E1129 Type 2 diabetes mellitus with other diabetic kidney complication: Secondary | ICD-10-CM | POA: Diagnosis present

## 2021-12-31 DIAGNOSIS — R809 Proteinuria, unspecified: Secondary | ICD-10-CM | POA: Diagnosis present

## 2021-12-31 DIAGNOSIS — I5032 Chronic diastolic (congestive) heart failure: Secondary | ICD-10-CM | POA: Insufficient documentation

## 2021-12-31 DIAGNOSIS — D638 Anemia in other chronic diseases classified elsewhere: Secondary | ICD-10-CM | POA: Insufficient documentation

## 2022-02-15 ENCOUNTER — Emergency Department (HOSPITAL_BASED_OUTPATIENT_CLINIC_OR_DEPARTMENT_OTHER): Payer: PPO | Admitting: Anesthesiology

## 2022-02-15 ENCOUNTER — Encounter (HOSPITAL_COMMUNITY): Payer: Self-pay

## 2022-02-15 ENCOUNTER — Emergency Department (HOSPITAL_COMMUNITY): Payer: PPO

## 2022-02-15 ENCOUNTER — Emergency Department (HOSPITAL_COMMUNITY): Payer: PPO | Admitting: Anesthesiology

## 2022-02-15 ENCOUNTER — Other Ambulatory Visit (INDEPENDENT_AMBULATORY_CARE_PROVIDER_SITE_OTHER): Payer: Self-pay | Admitting: Gastroenterology

## 2022-02-15 ENCOUNTER — Encounter (HOSPITAL_COMMUNITY)
Admission: EM | Disposition: A | Discharge status: Skilled Nursing Facility | Payer: Self-pay | Source: Home / Self Care | Attending: Emergency Medicine

## 2022-02-15 ENCOUNTER — Ambulatory Visit (HOSPITAL_COMMUNITY)
Admission: EM | Admit: 2022-02-15 | Discharge: 2022-02-15 | Disposition: A | Discharge status: Skilled Nursing Facility | Payer: PPO | Attending: Emergency Medicine | Admitting: Emergency Medicine

## 2022-02-15 ENCOUNTER — Other Ambulatory Visit: Payer: Self-pay

## 2022-02-15 DIAGNOSIS — T18108A Unspecified foreign body in esophagus causing other injury, initial encounter: Secondary | ICD-10-CM | POA: Diagnosis not present

## 2022-02-15 DIAGNOSIS — T18128A Food in esophagus causing other injury, initial encounter: Secondary | ICD-10-CM

## 2022-02-15 DIAGNOSIS — I129 Hypertensive chronic kidney disease with stage 1 through stage 4 chronic kidney disease, or unspecified chronic kidney disease: Secondary | ICD-10-CM | POA: Insufficient documentation

## 2022-02-15 DIAGNOSIS — X58XXXA Exposure to other specified factors, initial encounter: Secondary | ICD-10-CM | POA: Insufficient documentation

## 2022-02-15 DIAGNOSIS — Z7984 Long term (current) use of oral hypoglycemic drugs: Secondary | ICD-10-CM | POA: Insufficient documentation

## 2022-02-15 DIAGNOSIS — K449 Diaphragmatic hernia without obstruction or gangrene: Secondary | ICD-10-CM | POA: Insufficient documentation

## 2022-02-15 DIAGNOSIS — K2100 Gastro-esophageal reflux disease with esophagitis, without bleeding: Secondary | ICD-10-CM

## 2022-02-15 DIAGNOSIS — F32A Depression, unspecified: Secondary | ICD-10-CM | POA: Insufficient documentation

## 2022-02-15 DIAGNOSIS — N184 Chronic kidney disease, stage 4 (severe): Secondary | ICD-10-CM | POA: Insufficient documentation

## 2022-02-15 DIAGNOSIS — Z87891 Personal history of nicotine dependence: Secondary | ICD-10-CM | POA: Diagnosis not present

## 2022-02-15 DIAGNOSIS — T182XXA Foreign body in stomach, initial encounter: Secondary | ICD-10-CM | POA: Diagnosis not present

## 2022-02-15 DIAGNOSIS — K209 Esophagitis, unspecified without bleeding: Secondary | ICD-10-CM

## 2022-02-15 DIAGNOSIS — F0393 Unspecified dementia, unspecified severity, with mood disturbance: Secondary | ICD-10-CM | POA: Diagnosis not present

## 2022-02-15 DIAGNOSIS — Z20822 Contact with and (suspected) exposure to covid-19: Secondary | ICD-10-CM | POA: Diagnosis not present

## 2022-02-15 DIAGNOSIS — F419 Anxiety disorder, unspecified: Secondary | ICD-10-CM | POA: Insufficient documentation

## 2022-02-15 DIAGNOSIS — Z8673 Personal history of transient ischemic attack (TIA), and cerebral infarction without residual deficits: Secondary | ICD-10-CM | POA: Diagnosis not present

## 2022-02-15 DIAGNOSIS — E1122 Type 2 diabetes mellitus with diabetic chronic kidney disease: Secondary | ICD-10-CM | POA: Insufficient documentation

## 2022-02-15 HISTORY — PX: ESOPHAGOGASTRODUODENOSCOPY (EGD) WITH PROPOFOL: SHX5813

## 2022-02-15 LAB — RESP PANEL BY RT-PCR (FLU A&B, COVID) ARPGX2
Influenza A by PCR: NEGATIVE
Influenza B by PCR: NEGATIVE
SARS Coronavirus 2 by RT PCR: NEGATIVE

## 2022-02-15 LAB — COMPREHENSIVE METABOLIC PANEL
ALT: 9 U/L (ref 0–44)
AST: 13 U/L — ABNORMAL LOW (ref 15–41)
Albumin: 3.9 g/dL (ref 3.5–5.0)
Alkaline Phosphatase: 71 U/L (ref 38–126)
Anion gap: 13 (ref 5–15)
BUN: 45 mg/dL — ABNORMAL HIGH (ref 8–23)
CO2: 25 mmol/L (ref 22–32)
Calcium: 9.2 mg/dL (ref 8.9–10.3)
Chloride: 101 mmol/L (ref 98–111)
Creatinine, Ser: 2.06 mg/dL — ABNORMAL HIGH (ref 0.44–1.00)
GFR, Estimated: 22 mL/min — ABNORMAL LOW (ref >60)
Glucose, Bld: 265 mg/dL — ABNORMAL HIGH (ref 70–99)
Potassium: 3.7 mmol/L (ref 3.5–5.1)
Sodium: 139 mmol/L (ref 135–145)
Total Bilirubin: 0.5 mg/dL (ref 0.3–1.2)
Total Protein: 7 g/dL (ref 6.5–8.1)

## 2022-02-15 LAB — CBC WITH DIFFERENTIAL/PLATELET
Abs Immature Granulocytes: 0.04 10*3/uL (ref 0.00–0.07)
Basophils Absolute: 0 10*3/uL (ref 0.0–0.1)
Basophils Relative: 1 %
Eosinophils Absolute: 0.3 10*3/uL (ref 0.0–0.5)
Eosinophils Relative: 5 %
HCT: 38 % (ref 36.0–46.0)
Hemoglobin: 12.2 g/dL (ref 12.0–15.0)
Immature Granulocytes: 1 %
Lymphocytes Relative: 20 %
Lymphs Abs: 1.3 10*3/uL (ref 0.7–4.0)
MCH: 30.3 pg (ref 26.0–34.0)
MCHC: 32.1 g/dL (ref 30.0–36.0)
MCV: 94.3 fL (ref 80.0–100.0)
Monocytes Absolute: 0.5 10*3/uL (ref 0.1–1.0)
Monocytes Relative: 8 %
Neutro Abs: 4.3 10*3/uL (ref 1.7–7.7)
Neutrophils Relative %: 65 %
Platelets: 224 10*3/uL (ref 150–400)
RBC: 4.03 MIL/uL (ref 3.87–5.11)
RDW: 13.3 % (ref 11.5–15.5)
WBC: 6.5 10*3/uL (ref 4.0–10.5)
nRBC: 0 % (ref 0.0–0.2)

## 2022-02-15 LAB — LIPASE, BLOOD: Lipase: 39 U/L (ref 11–51)

## 2022-02-15 SURGERY — ESOPHAGOGASTRODUODENOSCOPY (EGD) WITH PROPOFOL
Anesthesia: General

## 2022-02-15 MED ORDER — PROPOFOL 500 MG/50ML IV EMUL: INTRAVENOUS | Status: DC | PRN

## 2022-02-15 MED ORDER — NITROGLYCERIN 0.4 MG SL SUBL: 0.4000 mg | SUBLINGUAL_TABLET | Freq: Once | SUBLINGUAL | Status: DC

## 2022-02-15 MED ORDER — GLUCAGON HCL RDNA (DIAGNOSTIC) 1 MG IJ SOLR
1.0000 mg | Freq: Once | INTRAMUSCULAR | Status: AC
Start: 2022-02-15 — End: 2022-02-15
  Administered 2022-02-15: 1 mg via INTRAVENOUS
  Filled 2022-02-15: qty 1

## 2022-02-15 MED ORDER — SODIUM CHLORIDE 0.9 % IV BOLUS: 1000.0000 mL | Freq: Once | INTRAVENOUS | Status: DC

## 2022-02-15 MED ORDER — LACTATED RINGERS IV SOLN: INTRAVENOUS | Status: DC

## 2022-02-15 MED ORDER — SODIUM CHLORIDE 0.9 % IV SOLN: INTRAVENOUS | Status: DC

## 2022-02-15 MED ORDER — PROPOFOL 10 MG/ML IV BOLUS
INTRAVENOUS | Status: DC | PRN
  Administered 2022-02-15 (×2): 50 mg via INTRAVENOUS

## 2022-02-15 MED ORDER — SUCCINYLCHOLINE CHLORIDE 200 MG/10ML IV SOSY
PREFILLED_SYRINGE | INTRAVENOUS | Status: DC | PRN
  Administered 2022-02-15: 100 mg via INTRAVENOUS

## 2022-02-15 MED ORDER — PANTOPRAZOLE SODIUM 40 MG PO TBEC: 40.0000 mg | DELAYED_RELEASE_TABLET | Freq: Every day | ORAL | 3 refills | Status: DC

## 2022-02-15 NOTE — Op Note (Addendum)
[facility] ?Patient Name: Nancy Medina ?Procedure Date: 02/15/2022 4:05 PM ?MRN: [id number] ?Date of Birth: Sep 03, 1925 ?Attending MD: Maylon Peppers ,  ?CSN: [id number] ?Age: 88 ?Admit Type: Outpatient ?Procedure:                Upper GI endoscopy ?Indications:              Foreign body in the esophagus ?Providers:                Maylon Peppers, Charlsie Quest Theda Sers RN, RN, Eugene Garnet  ?                          Shanon Brow, Technician ?Referring MD:              ?Medicines:                Monitored Anesthesia Care ?Complications:            No immediate complications. ?Estimated Blood Loss:     Estimated blood loss: none. ?Procedure:                Pre-Anesthesia Assessment: ?                          - Prior to the procedure, a History and Physical  ?                          was performed, and patient medications, allergies  ?                          and sensitivities were reviewed. The patient's  ?                          tolerance of previous anesthesia was reviewed. ?                          - The risks and benefits of the procedure and the  ?                          sedation options and risks were discussed with the  ?                          patient. All questions were answered and informed  ?                          consent was obtained. ?                          After obtaining informed consent, the endoscope was  ?                          passed under direct vision. Throughout the  ?                          procedure, the patient's blood pressure, pulse, and  ?                          oxygen saturations were monitored  continuously. The  ?                          [id number]-[id number] ([id number]) scope was introduced through the  ?                          mouth, and advanced to the second part of duodenum.  ?                          The upper GI endoscopy was accomplished without  ?                          difficulty. The patient tolerated the procedure  ?                          well. ?Scope In: 4:22:27  PM ?Scope Out: 4:42:45 PM ?Total Procedure Duration: 0 hours 20 minutes 18 seconds  ?Findings: ?     Food was found in the entire esophagus. Removal of food was accomplished  ?     with the use of a transparent cap and a Roth net. ?     Moderately severe esophagitis with no bleeding was found in the lower  ?     third of the esophagus. ?     A medium amount of food (residue) was found in the entire examined  ?     stomach. ?     The examined duodenum was normal. ?Impression:               - Food in the esophagus. Removal was successful. ?                          - Moderately severe esophagitis with no bleeding. ?                          - A medium amount of food (residue) in the stomach. ?                          - Normal examined duodenum. ?Moderate Sedation: ?     Per Anesthesia Care ?Recommendation:           - Discharge patient to home (ambulatory). ?                          - Full liquid diet for the rest of the patient's  ?                          life. ?                          - Use Prilosec (omeprazole) 40 mg PO daily  ?                          indefinitely. ?Procedure Code(s):        --- Professional --- ?                          [phone], Esophagogastroduodenoscopy, flexible,  ?  transoral; with removal of foreign body(s) ?Diagnosis Code(s):        --- Professional --- ?                          R44.315Q, Food in esophagus causing other injury,  ?                          initial encounter ?                          K20.90, Esophagitis, unspecified without bleeding ?                          T18.2XXA, Foreign body in stomach, initial encounter ?                          T18.108A, Unspecified foreign body in esophagus  ?                          causing other injury, initial encounter ?CPT copyright 2019 American Medical Association. All rights reserved. ?The codes documented in this report are preliminary and upon coder review may  ?be revised to meet current compliance  requirements. ?Maylon Peppers, MD ?Maylon Peppers,  ?02/15/2022 4:48:19 PM ?This report has been signed electronically. ?Number of Addenda: 0 ?

## 2022-02-15 NOTE — ED Provider Notes (Signed)
?[facility] ?Provider Note ? ? ?CSN: [id number] ?Arrival date & time: 02/15/22  1320 ? ?  ? ?History ? ?Chief Complaint  ?Patient presents with  ? Swallowed Foreign Body  ? ? ?Nancy Medina is a 88 y.o. female. ? ?HPI ? ?HPI will be deferred due to level 5 caveat dementia ? ?Patient with medical history including diabetes, dementia, CKD stage IV CVA presents with episode of choking.  Patient's daughter at  bedside to provide HPI, she states that patient has been having difficulty swallowing for over a years time, states that she was to go down for a swallow study but never had that done.  She states that she was notified by the nursing facility [facility] and that patient was eating a ham sandwich and started to choke it.  staff state that she was able to swallow the sandwich but afterwards she was unable to drink.  Every time she drinks, she would vomit it back out.  staff states that she has been drooling more often than usual.  Daughter states that patient is at her normal baseline, she does not appear to be acting differently, she is not a DNR, she has had surgery in the past for a hip. ? ?Per [facility] nurse, patient was eating ham sandwich today and successfully swallowed her food. Patient then went to drink water and began throwing up mucous without obvious food particles. Patient then started complaining of chest tightness. Denies any recent falls. History of chronic UTIs but no symptoms of infection recently. States patient has been acting like herself but has "bouts of depression" where patient "refuses to walk or talk" but eventually returns to baseline. Patient may be in a bout of depression currently.  ? ? ? ?Home Medications ?Prior to Admission medications   ?Medication Sig Start Date End Date Taking? Authorizing Provider  ?acetaminophen (TYLENOL) 325 MG tablet Take 2 tablets (650 mg total) by mouth every 6 (six) hours as needed for mild pain (or Fever >/= 101). 11/19/21  Yes  Roxan Hockey, MD  ?aspirin 81 MG tablet Take 1 tablet (81 mg total) by mouth daily with breakfast. 11/19/21  Yes Emokpae, Courage, MD  ?brimonidine (ALPHAGAN) 0.2 % ophthalmic solution Place 1 drop into both eyes every 8 (eight) hours. 02/23/21  Yes [provider]  ?Cholecalciferol (VITAMIN D) 2000 units CAPS Take 1 capsule by mouth daily.   Yes [provider]  ?clonazePAM (KLONOPIN) 0.5 MG tablet Take 0.5 mg by mouth at bedtime. 12/16/21  Yes [provider]  ?cycloSPORINE (RESTASIS) 0.05 % ophthalmic emulsion Place 1 drop into both eyes 2 (two) times daily.   Yes [provider]  ?dorzolamide-timolol (COSOPT) 22.3-6.8 MG/ML ophthalmic solution Place 1 drop into both eyes in the morning and at bedtime. 07/15/20  Yes [provider]  ?famotidine (PEPCID) 20 MG tablet Take 20 mg by mouth daily. 10/21/21  Yes [provider]  ?feeding supplement (ENSURE ENLIVE / ENSURE PLUS) LIQD Take 237 mLs by mouth 2 (two) times daily between meals. 11/19/21  Yes Roxan Hockey, MD  ?furosemide (LASIX) 20 MG tablet Take 1 tablet (20 mg total) by mouth every other day. 12/27/21  Yes Barton Dubois, MD  ?hydrALAZINE (APRESOLINE) 50 MG tablet Take 1 tablet (50 mg total) by mouth 3 (three) times daily. 11/19/21  Yes Roxan Hockey, MD  ?loperamide (IMODIUM A-D) 2 MG tablet Take 2 mg by mouth 4 (four) times daily as needed for diarrhea or loose stools.   Yes  [provider]  ?LORazepam (ATIVAN) 0.5 MG tablet Take 1 tablet (0.5 mg total) by mouth daily as needed for anxiety. 11/19/21  Yes Roxan Hockey, MD  ?mirtazapine (REMERON) 7.5 MG tablet Take 15 mg by mouth at bedtime. 07/01/21  Yes [provider]  ?pantoprazole (PROTONIX) 40 MG tablet Take 1 tablet (40 mg total) by mouth daily. 12/28/21  Yes Barton Dubois, MD  ?ROCKLATAN 0.02-0.005 % SOLN Place 1 drop into both eyes at bedtime. 02/20/21  Yes [provider]  ?sitaGLIPtin (JANUVIA) 25 MG tablet Take  1 tablet (25 mg total) by mouth daily. 03/20/18  Yes Tat, Shanon Brow, MD  ?vitamin B-12 (CYANOCOBALAMIN) 1000 MCG tablet Take 1 tablet (1,000 mcg total) by mouth daily. 03/20/18  Yes Tat, Shanon Brow, MD  ?amLODipine (NORVASC) 2.5 MG tablet Take 2.5 mg by mouth daily. ?Patient not taking: Reported on 02/15/2022 12/16/21   [provider]  ?   ? ?Allergies    ?Metformin   ? ?Review of Systems   ?Review of Systems  ?Unable to perform ROS: Dementia  ? ?Physical Exam ?Updated Vital Signs ?BP (!) 179/99 (BP Location: Right Arm)   Pulse 72   Temp 98 ?F (36.7 ?C) (Oral)   Resp 20   Ht '5\' 4"'$  (1.626 m)   Wt 71.7 kg   SpO2 98%   BMI 27.12 kg/m?  ?Physical Exam ?Vitals and nursing note reviewed.  ?Constitutional:   ?   General: She is not in acute distress. ?   Appearance: She is not ill-appearing.  ?   Comments: Deconditioned state  ?HENT:  ?   Head: Normocephalic and atraumatic.  ?   Comments: No deformity of  the head present no raccoon eyes or battle sign noted. ?   Nose: No congestion.  ?   Mouth/Throat:  ?   Mouth: Mucous membranes are moist.  ?   Pharynx: Oropharynx is clear. No oropharyngeal exudate or posterior oropharyngeal erythema.  ?   Comments: No trismus no torticollis tongue you have both midline controlling, patient was actively drooling, tonsils equal symmetrical bilaterally no tongue elevation ?Eyes:  ?   Conjunctiva/sclera: Conjunctivae normal.  ?   Pupils: Pupils are equal, round, and reactive to light.  ?Cardiovascular:  ?   Rate and Rhythm: Normal rate and regular rhythm.  ?   Pulses: Normal pulses.  ?   Heart sounds: No murmur heard. ?  No friction rub. No gallop.  ?Pulmonary:  ?   Effort: No respiratory distress.  ?   Breath sounds: No wheezing, rhonchi or rales.  ?Abdominal:  ?   Palpations: Abdomen is soft.  ?   Tenderness: There is no abdominal tenderness. There is no right CVA tenderness or left CVA tenderness.  ?Musculoskeletal:  ?   Cervical back: Neck supple.  ?Skin: ?   General: Skin is warm  and dry.  ?   Comments: No hives or systemic rash present my exam.  ?Neurological:  ?   Mental Status: She is alert.  ?   Comments: Patient at her baseline per daughter who is at bedside, no facial asymmetry, no unilateral weakness present.  ?Psychiatric:     ?   Mood and Affect: Mood normal.  ? ? ?ED Results / Procedures / Treatments   ?Labs ?(all labs ordered are listed, but only abnormal results are displayed) ?Labs Reviewed  ?RESP PANEL BY RT-PCR (FLU A&B, COVID) ARPGX2  ?CBC WITH DIFFERENTIAL/PLATELET  ?COMPREHENSIVE METABOLIC PANEL  ?LIPASE, BLOOD  ? ? ?EKG ?  None ? ?Radiology ?DG Chest Portable 1 View ? ?Result Date: 02/15/2022 ?CLINICAL DATA:  Coughing, choking. EXAM: PORTABLE CHEST 1 VIEW COMPARISON:  December 25, 2021. FINDINGS: Stable cardiomediastinal silhouette. Both lungs are clear. The visualized skeletal structures are unremarkable. IMPRESSION: No active disease. Electronically Signed   By: Marijo Conception M.D.   On: 02/15/2022 14:20   ? ?Procedures ?Procedures  ? ? ?Medications Ordered in ED ?Medications  ?nitroGLYCERIN (NITROSTAT) SL tablet 0.4 mg (has no administration in time range)  ?glucagon (human recombinant) (GLUCAGEN) injection 1 mg (1 mg Intravenous Given 02/15/22 1502)  ? ? ?ED Course/ Medical Decision Making/ A&P ?  ?                        ?Medical Decision Making ?Amount and/or Complexity of Data Reviewed ?Labs: ordered. ?Radiology: ordered. ? ?Risk ?Prescription drug management. ?Decision regarding hospitalization. ? ? ?This patient presents to the ED for concern of choking, this involves an extensive number of treatment options, and is a complaint that carries with it a high risk of complications and morbidity.  The differential diagnosis includes food impaction, anaphylaxis, foreign body in the upper airway ? ? ? ?Additional history obtained: ? ?Additional history obtained from daughter who is at bedside, nursing staff at patient's facility ?External records from outside source obtained  and reviewed including please review HPI ? ? ?Co morbidities that complicate the patient evaluation ? ?Dementia, CKD stage IV ? ?Social Determinants of Health: ? ?Geriatric ? ? ? ?Lab Tests: ? ?I Ordered, and pe

## 2022-02-15 NOTE — ED Triage Notes (Signed)
Patient via EMS from [facility] due to choking or coughing while eating a ham sandwich.  ?

## 2022-02-15 NOTE — Anesthesia Preprocedure Evaluation (Signed)
Anesthesia Evaluation  ?Patient identified by MRN, date of birth, ID band ?Patient awake ? ? ? ?Reviewed: ?Allergy & Precautions, H&P , NPO status , Patient's Chart, lab work & pertinent test results, reviewed documented beta blocker date and time  ? ?Airway ?Mallampati: II ? ?TM Distance: >3 FB ?Neck ROM: full ? ? ? Dental ?no notable dental hx. ? ?  ?Pulmonary ?neg pulmonary ROS, former smoker,  ?  ?Pulmonary exam normal ?breath sounds clear to auscultation ? ? ? ? ? ? Cardiovascular ?Exercise Tolerance: Good ?hypertension, negative cardio ROS ? ? ?Rhythm:regular Rate:Normal ? ? ?  ?Neuro/Psych ?PSYCHIATRIC DISORDERS Anxiety Depression negative neurological ROS ?   ? GI/Hepatic ?Neg liver ROS, GERD  Medicated,  ?Endo/Other  ?negative endocrine ROSdiabetes, Type 2 ? Renal/GU ?CRFRenal disease  ?negative genitourinary ?  ?Musculoskeletal ? ? Abdominal ?  ?Peds ? Hematology ? ?(+) Blood dyscrasia, anemia ,   ?Anesthesia Other Findings ? ? Reproductive/Obstetrics ?negative OB ROS ? ?  ? ? ? ? ? ? ? ? ? ? ? ? ? ?  ?  ? ? ? ? ? ? ? ? ?Anesthesia Physical ?Anesthesia Plan ? ?ASA: 3 and emergent ? ?Anesthesia Plan: General, General ETT and Rapid Sequence  ? ?Post-op Pain Management:   ? ?Induction:  ? ?PONV Risk Score and Plan: Ondansetron ? ?Airway Management Planned:  ? ?Additional Equipment:  ? ?Intra-op Plan:  ? ?Post-operative Plan:  ? ?Informed Consent: I have reviewed the patients History and Physical, chart, labs and discussed the procedure including the risks, benefits and alternatives for the proposed anesthesia with the patient or authorized representative who has indicated his/her understanding and acceptance.  ? ? ? ?Dental Advisory Given ? ?Plan Discussed with: CRNA ? ?Anesthesia Plan Comments:   ? ? ? ? ? ? ?Anesthesia Quick Evaluation ? ?

## 2022-02-15 NOTE — Transfer of Care (Signed)
Immediate Anesthesia Transfer of Care Note ? ?Patient: SHANIKIA KERNODLE ? ?Procedure(s) Performed: ESOPHAGOGASTRODUODENOSCOPY (EGD) WITH PROPOFOL ? ?Patient Location: PACU ? ?Anesthesia Type:General ? ?Level of Consciousness: awake, alert , oriented and patient cooperative ? ?Airway & Oxygen Therapy: Patient Spontanous Breathing and Patient connected to nasal cannula oxygen ? ?Post-op Assessment: Report given to RN, Post -op Vital signs reviewed and stable and Patient moving all extremities X 4 ? ?Post vital signs: Reviewed ? ?Last Vitals:  ?Vitals Value Taken Time  ?BP 183/86 02/15/22 1648  ?Temp    ?Pulse 72 02/15/22 1650  ?Resp 18 02/15/22 1650  ?SpO2 99 % 02/15/22 1650  ?Vitals shown include unvalidated device data. ? ?Last Pain:  ?Vitals:  ? 02/15/22 1615  ?TempSrc:   ?PainSc: 0-No pain  ?   ? ?  ? ?Complications: No notable events documented. ?

## 2022-02-15 NOTE — Anesthesia Procedure Notes (Signed)
Procedure Name: Intubation ?Date/Time: 02/15/2022 4:18 PM ?Performed by: Jonna Munro, CRNA ?Pre-anesthesia Checklist: Patient identified, Emergency Drugs available, Suction available, Patient being monitored and Timeout performed ?Patient Re-evaluated:Patient Re-evaluated prior to induction ?Oxygen Delivery Method: Circle system utilized ?Preoxygenation: Pre-oxygenation with 100% oxygen ?Induction Type: IV induction, Rapid sequence and Cricoid Pressure applied ?Laryngoscope Size: Mac and 3 ?Grade View: Grade I ?Tube type: Oral ?Number of attempts: 1 ?Airway Equipment and Method: Stylet ?Placement Confirmation: ETT inserted through vocal cords under direct vision, positive ETCO2 and breath sounds checked- equal and bilateral ?Secured at: 22 cm ?Tube secured with: Tape ?Dental Injury: Teeth and Oropharynx as per pre-operative assessment  ? ? ? ? ?

## 2022-02-15 NOTE — Consult Note (Signed)
? ?Gastroenterology Consult  ? ?Referring Provider: No ref. provider found ?Primary Care Physician:  Vilinda Boehringer, NP ?Primary Gastroenterologist:  formerly Dr. Oneida Alar ? ?Patient ID: Nancy Medina; [id number]; Jul 25, 1925  ? ?Admit date: 02/15/2022 ? LOS: 0 days  ? ?Date of Consultation: 02/15/2022 ? ?Reason for Consultation:  food impaction ? ?History of Present Illness  ? ?Nancy Medina is a 88 y.o. year old female with history of HTN, CKD stage 3, vertigo, anxiety, anemia of chronic disease, HLD, GERD, Diabetes, HTN, and diverticulitis, who comes to the hospital for evaluation after presenting an episode of food impaction. The patient was sent via EMS from the nursing home due to choking on her ham sandwich at lunch time. She is confused at baseline therefore unable to give any history. Per report from nursing home to daughter she was choking on her sandwich and spitting up bites of it and unable to swallow water.  ?  ?Drooling:no ?Able to swallow food: no ?Able to drink liquids: no ?Previous reflux symptoms: yes, on medication ?Weight changes: unclear ?Seasonal allegies: no ?  ?Previous episodes: Per daughter this is first occurrence of impaction. ?  ?Medications currently used: pepcid and pantoprazole. Not on blood thinners ?  ?Previous EGD: 06/24/2011 - distal esophageal stricture dilated, 3-4 cm hiatal hernia, esophageal erosions, normal duodenum ? ?Previous colonoscopy: 11/25/2011 - scattered diverticula in sigmoid and descending colon, small internal hemorrhoids, formed stool in the colon ?  ?In the ER, her vital signs were BP 179/99, HR 64-72, RR 171-20. SpO2 96% on room air.  She was protecting her airway. Labs were notable for Hgb 12.2, Potassium 3.7, Creatinine 2.06, platelets 224. Chest x-ray clear. ? ? ? ? ?Past Medical History:  ?Diagnosis Date  ? Achilles tendinitis   ? Allergic rhinitis   ? Anemia of chronic disease   ? at least back to 2010  ? Anxiety   ? Benign paroxysmal positional vertigo   ?  Cataract   ? Cerebrovascular accident [facility])   ? Chronic kidney disease (CKD), stage III (moderate) (HCC)   ? Degenerative disc disease, thoracic   ? Depression   ? Diverticulitis   ? DJD (degenerative joint disease)   ? DM (diabetes mellitus) ([place])   ? type II  ? GERD (gastroesophageal reflux disease)   ? HTN (hypertension)   ? HX: breast cancer   ? Hyperlipidemia   ? Hypertonicity of bladder   ? Knee pain, left   ? Leg pain   ? Lymphocytic colitis 2008  ? treated with Lialda short-term  ? Memory loss   ? Obesity   ? Osteoporosis, unspecified   ? Renal disease   ? stage III  ? Unspecified fall   ? Unspecified glaucoma(365.9)   ? ? ?Past Surgical History:  ?Procedure Laterality Date  ? Bladder tack  2000  ? BREAST LUMPECTOMY  2004  ? right  ? cataracts    ? CHOLECYSTECTOMY  1960  ? COLONOSCOPY  07/2007  ? lymphocytic colitis  ? ESOPHAGOGASTRODUODENOSCOPY  06/24/2011  ? Procedure: ESOPHAGOGASTRODUODENOSCOPY (EGD);  Surgeon: Dorothyann Peng, MD;  Location: [facility];  Service: Endoscopy;  Laterality: N/A;.  Peptic stricture, mild erosive reflux esophagitis, hiatal  ? FEMUR IM NAIL Left 04/28/2020  ? Procedure: INTRAMEDULLARY (IM) NAIL FEMORAL;  Surgeon: Marchia Bond, MD;  Location: [facility] ORS;  Service: Orthopedics;  Laterality: Left;  ? FLEXIBLE SIGMOIDOSCOPY  11/25/2011  ? MODERATE DIVERTICULOSIS/INTERNAL HEMORRHOIDS  ? S/P Hysterectomy  2000  ? benign reasons  ?  SAVORY DILATION  06/24/2011  ? Procedure: SAVORY DILATION;  Surgeon: Dorothyann Peng, MD;  Location: [facility];  Service: Endoscopy;  Laterality: N/A;  ? ? ?Prior to Admission medications   ?Medication Sig Start Date End Date Taking? Authorizing Provider  ?acetaminophen (TYLENOL) 325 MG tablet Take 2 tablets (650 mg total) by mouth every 6 (six) hours as needed for mild pain (or Fever >/= 101). 11/19/21  Yes Roxan Hockey, MD  ?aspirin 81 MG tablet Take 1 tablet (81 mg total) by mouth daily with breakfast. 11/19/21  Yes Emokpae, Courage, MD  ?brimonidine  (ALPHAGAN) 0.2 % ophthalmic solution Place 1 drop into both eyes every 8 (eight) hours. 02/23/21  Yes [provider]  ?Cholecalciferol (VITAMIN D) 2000 units CAPS Take 1 capsule by mouth daily.   Yes [provider]  ?clonazePAM (KLONOPIN) 0.5 MG tablet Take 0.5 mg by mouth at bedtime. 12/16/21  Yes [provider]  ?cycloSPORINE (RESTASIS) 0.05 % ophthalmic emulsion Place 1 drop into both eyes 2 (two) times daily.   Yes [provider]  ?dorzolamide-timolol (COSOPT) 22.3-6.8 MG/ML ophthalmic solution Place 1 drop into both eyes in the morning and at bedtime. 07/15/20  Yes [provider]  ?famotidine (PEPCID) 20 MG tablet Take 20 mg by mouth daily. 10/21/21  Yes [provider]  ?feeding supplement (ENSURE ENLIVE / ENSURE PLUS) LIQD Take 237 mLs by mouth 2 (two) times daily between meals. 11/19/21  Yes Roxan Hockey, MD  ?furosemide (LASIX) 20 MG tablet Take 1 tablet (20 mg total) by mouth every other day. 12/27/21  Yes Barton Dubois, MD  ?hydrALAZINE (APRESOLINE) 50 MG tablet Take 1 tablet (50 mg total) by mouth 3 (three) times daily. 11/19/21  Yes Roxan Hockey, MD  ?loperamide (IMODIUM A-D) 2 MG tablet Take 2 mg by mouth 4 (four) times daily as needed for diarrhea or loose stools.   Yes [provider]  ?LORazepam (ATIVAN) 0.5 MG tablet Take 1 tablet (0.5 mg total) by mouth daily as needed for anxiety. 11/19/21  Yes Roxan Hockey, MD  ?mirtazapine (REMERON) 7.5 MG tablet Take 15 mg by mouth at bedtime. 07/01/21  Yes [provider]  ?pantoprazole (PROTONIX) 40 MG tablet Take 1 tablet (40 mg total) by mouth daily. 12/28/21  Yes Barton Dubois, MD  ?ROCKLATAN 0.02-0.005 % SOLN Place 1 drop into both eyes at bedtime. 02/20/21  Yes [provider]  ?sitaGLIPtin (JANUVIA) 25 MG tablet Take 1 tablet (25 mg total) by mouth daily. 03/20/18  Yes Tat, Shanon Brow, MD  ?vitamin B-12 (CYANOCOBALAMIN) 1000 MCG tablet Take 1 tablet (1,000 mcg total) by  mouth daily. 03/20/18  Yes Tat, Shanon Brow, MD  ?amLODipine (NORVASC) 2.5 MG tablet Take 2.5 mg by mouth daily. ?Patient not taking: Reported on 02/15/2022 12/16/21   [provider]  ? ? ?Current Facility-Administered Medications  ?Medication Dose Route Frequency Provider Last Rate Last Admin  ? glucagon (human recombinant) (GLUCAGEN) injection 1 mg  1 mg Intravenous Once Marcello Fennel, PA-C      ? nitroGLYCERIN (NITROSTAT) SL tablet 0.4 mg  0.4 mg Sublingual Once Marcello Fennel, PA-C      ? ?Current Outpatient Medications  ?Medication Sig Dispense Refill  ? acetaminophen (TYLENOL) 325 MG tablet Take 2 tablets (650 mg total) by mouth every 6 (six) hours as needed for mild pain (or Fever >/= 101). 12 tablet 0  ? aspirin 81 MG tablet Take 1 tablet (81 mg total) by mouth daily with breakfast. 30 tablet  1  ? brimonidine (ALPHAGAN) 0.2 % ophthalmic solution Place 1 drop into both eyes every 8 (eight) hours.    ? Cholecalciferol (VITAMIN D) 2000 units CAPS Take 1 capsule by mouth daily.    ? clonazePAM (KLONOPIN) 0.5 MG tablet Take 0.5 mg by mouth at bedtime.    ? cycloSPORINE (RESTASIS) 0.05 % ophthalmic emulsion Place 1 drop into both eyes 2 (two) times daily.    ? dorzolamide-timolol (COSOPT) 22.3-6.8 MG/ML ophthalmic solution Place 1 drop into both eyes in the morning and at bedtime.    ? famotidine (PEPCID) 20 MG tablet Take 20 mg by mouth daily.    ? feeding supplement (ENSURE ENLIVE / ENSURE PLUS) LIQD Take 237 mLs by mouth 2 (two) times daily between meals. 237 mL 12  ? furosemide (LASIX) 20 MG tablet Take 1 tablet (20 mg total) by mouth every other day.    ? hydrALAZINE (APRESOLINE) 50 MG tablet Take 1 tablet (50 mg total) by mouth 3 (three) times daily. 90 tablet 2  ? loperamide (IMODIUM A-D) 2 MG tablet Take 2 mg by mouth 4 (four) times daily as needed for diarrhea or loose stools.    ? LORazepam (ATIVAN) 0.5 MG tablet Take 1 tablet (0.5 mg total) by mouth daily as needed for anxiety. 15 tablet 0   ? mirtazapine (REMERON) 7.5 MG tablet Take 15 mg by mouth at bedtime.    ? pantoprazole (PROTONIX) 40 MG tablet Take 1 tablet (40 mg total) by mouth daily. 30 tablet 0  ? ROCKLATAN 0.02-0.005 % SOLN Place 1

## 2022-02-15 NOTE — Brief Op Note (Signed)
02/15/2022 ? ?4:48 PM ? ?PATIENT:  Nancy Medina  88 y.o. female ? ?PRE-OPERATIVE DIAGNOSIS:  food impaction ? ?POST-OPERATIVE DIAGNOSIS:  food disimpaction/hiatal hernia  ? ?PROCEDURE:  Procedure(s): ?ESOPHAGOGASTRODUODENOSCOPY (EGD) WITH PROPOFOL (N/A) ? ?SURGEON:  Surgeon(s) and Role: ?   Harvel Quale, MD - Primary ? ?Patient underwent EGD under propofol sedation.  Tolerated the procedure adequately.  Food was found in the entire esophagus.  Removal of food was accomplished with the use of a transparent cap and a Roth net. Moderately severe esophagitis with no bleeding was found in the lower third of the esophagus. A medium amount of food (residue) was found in the entire examined stomach. The examined duodenum was normal.  ? ?RECOMMENDATIONS ?- Discharge patient to home (ambulatory). ? - Full liquid diet for the rest of the patient's life. ?- Use Prilosec (omeprazole) 40 mg PO daily indefinitely. ? ?Maylon Peppers, MD ?Gastroenterology and Hepatology ?[facility] ? ?

## 2022-02-16 ENCOUNTER — Telehealth (INDEPENDENT_AMBULATORY_CARE_PROVIDER_SITE_OTHER): Payer: Self-pay | Admitting: *Deleted

## 2022-02-16 LAB — GLUCOSE, CAPILLARY: Glucose-Capillary: 278 mg/dL — ABNORMAL HIGH (ref 70–99)

## 2022-02-16 NOTE — Anesthesia Postprocedure Evaluation (Signed)
Anesthesia Post Note ? ?Patient: Nancy Medina ? ?Procedure(s) Performed: ESOPHAGOGASTRODUODENOSCOPY (EGD) WITH PROPOFOL ? ?Patient location during evaluation: Phase II ?Anesthesia Type: General ?Level of consciousness: awake ?Pain management: pain level controlled ?Vital Signs Assessment: post-procedure vital signs reviewed and stable ?Respiratory status: spontaneous breathing and respiratory function stable ?Cardiovascular status: blood pressure returned to baseline and stable ?Postop Assessment: no headache and no apparent nausea or vomiting ?Anesthetic complications: no ?Comments: Late entry ? ? ?No notable events documented. ? ? ?Last Vitals:  ?Vitals:  ? 02/15/22 1700 02/15/22 1728  ?BP: (!) 197/85 (!) 221/90  ?Pulse: 67 68  ?Resp: 16 17  ?Temp:  (!) 36.1 ?C  ?SpO2: 100% 97%  ?  ?Last Pain:  ?Vitals:  ? 02/15/22 1728  ?TempSrc: Oral  ?PainSc: 0-No pain  ? ? ?  ?  ?  ?  ?  ?  ? ?Louann Sjogren ? ? ? ? ?

## 2022-02-16 NOTE — Telephone Encounter (Signed)
She can continue pantoprazole, does not need to switch to omeprazole ?

## 2022-02-16 NOTE — Telephone Encounter (Signed)
Meredith from [facility] calling to clarify order. Pt has been on pantoprazole '40mg'$  one daily and had endocopy yesterday and prescribed prilosec '40mg'$  one daily. Wants to clarify if she is to change from pantoprazole 40 to prilosec as stated on endoscopy report. ( I checked med list and saw where pantoprazole was sent in yesterday and do not see prilosec on med list)  ? ?Ailene Ravel at [facility] - [phone] ?

## 2022-02-16 NOTE — Telephone Encounter (Signed)
Called and left detailed message on meredith's voicemail at pharm.  ?

## 2022-02-16 NOTE — Telephone Encounter (Signed)
Nancy Medina from [facility] phone - [phone] ? ?Patient seen in ED yesterday and had upper GI. Lovey Newcomer states patient Came back  last night with diet order for a full liquid diet for the rest of her life. She States this is unrealistic and would like to know if they can advance her diet as tolerated. Can an esophageal stretching be done to see if she can tolerate solid foods.  ?

## 2022-02-16 NOTE — Telephone Encounter (Signed)
Spoke to Nancy Medina.  Explained to her that she likely has severe esophageal dysmotility as evidenced by the presence of diverticula in her distal esophagus which will not improve with esophageal dilations.  She needs to adhere to a liquid diet, can try things like honey to thicken the liquids more but I would strongly advise not to advance her diet to any solid/soft consistency. Patient understood and agreed. ? ?

## 2022-02-18 ENCOUNTER — Encounter (HOSPITAL_COMMUNITY): Payer: Self-pay | Admitting: Gastroenterology

## 2022-02-21 ENCOUNTER — Non-Acute Institutional Stay (SKILLED_NURSING_FACILITY): Payer: PPO | Admitting: Internal Medicine

## 2022-02-21 ENCOUNTER — Encounter: Payer: Self-pay | Admitting: Internal Medicine

## 2022-02-21 DIAGNOSIS — E1169 Type 2 diabetes mellitus with other specified complication: Secondary | ICD-10-CM | POA: Diagnosis not present

## 2022-02-21 DIAGNOSIS — E1122 Type 2 diabetes mellitus with diabetic chronic kidney disease: Secondary | ICD-10-CM | POA: Diagnosis not present

## 2022-02-21 DIAGNOSIS — E785 Hyperlipidemia, unspecified: Secondary | ICD-10-CM

## 2022-02-21 DIAGNOSIS — R1319 Other dysphagia: Secondary | ICD-10-CM

## 2022-02-21 DIAGNOSIS — D649 Anemia, unspecified: Secondary | ICD-10-CM | POA: Diagnosis not present

## 2022-02-21 DIAGNOSIS — N184 Chronic kidney disease, stage 4 (severe): Secondary | ICD-10-CM

## 2022-02-21 DIAGNOSIS — E441 Mild protein-calorie malnutrition: Secondary | ICD-10-CM

## 2022-02-21 NOTE — Progress Notes (Signed)
? ?  NURSING HOME LOCATION:  [place] ?ROOM NUMBER:  [id number] ? ?CODE STATUS:  DNR ? ?PCP: Vilinda Boehringer, NP ? ?This is a comprehensive admission note to this SNFperformed on this date less than 30 days from date of admission. ?Included are preadmission medical/surgical history; reconciled medication list; family history; social history and comprehensive review of systems.  ?Corrections and additions to the records were documented. Comprehensive physical exam was also performed. Additionally a clinical summary was entered for each active diagnosis pertinent to this admission in the Problem List to enhance continuity of care. ? ?HPI: She is a legal ward of the state and apparently was at [facility] but has been transferred here as her social worker resides in this county. ?On 02/15/2022 Dr. Jenetta Downer, GI performed endoscopy to remove food impaction. ?Labs were completed 02/15/2022 and revealed a creatinine of 2.06 and GFR of 22. CBC and differential was normal.  A1c was performed 12/25/2021 and was 6.6% indicating excellent control in the context of her advanced age and comorbidities. ? ?Past medical and surgical history: Include essential hypertension, vitamin D deficiency, GERD, paroxysmal positional vertigo, glaucoma, dyslipidemia, history of stroke, and diabetes with CKD stage IV.  ?Surgeries and procedures include bladder tacking, breast lumpectomy, cholecystectomy, colonoscopy, femur IM nailing, hysterectomy, multiple EGDs, and savory dilation. ? ?Social history: Nondrinker; former smoker. ? ?Family history: Noncontributory due to advanced age. ?  ?Review of systems: Clinical neurocognitive deficits prevented ROS completion. Date of birth given as only [birth year].  She could not give me the year.  She cannot tell me where she resided prior to being transferred here.  She stated that she was "all right".  She denied any history of dysphagia.  She could relate nothing about her recent endoscopy or the  findings.  She had difficulty following commands. ? ?Physical exam:  ?Pertinent or positive findings: When I entered the room she was asleep and wearing a mask.  She did not exhibit any snoring, hypopnea or apnea.  She was difficult to arouse.  She is markedly hard of hearing.  She has bilateral ptosis.  Teeth are coated.  She has a grade 8/9-2 systolic murmur at the right base.  First heart sound is increased.  Abdomen is protuberant.  She has 1/2+ edema at the sock line. ? ?General appearance: Adequately nourished; no acute distress, increased work of breathing is present.   ?Lymphatic: No lymphadenopathy about the head, neck, axilla. ?Eyes: No conjunctival inflammation or lid edema is present. There is no scleral icterus. ?Ears:  External ear exam shows no significant lesions or deformities.   ?Nose:  External nasal examination shows no deformity or inflammation. Nasal mucosa are pink and moist without lesions, exudates ?Neck:  No thyromegaly, masses, tenderness noted.    ?Heart:  Normal rate and regular rhythm. S2 normal without gallop,  click, rub.  ?Lungs: Chest clear to auscultation without wheezes, rhonchi, rales, rubs. ?Abdomen: Bowel sounds are normal.  Abdomen is soft and nontender with no organomegaly, hernias, masses. ?GU: Deferred  ?Extremities:  No cyanosis, clubbing. ?Neurologic exam:  Balance, Rhomberg, finger to nose testing could not be completed due to clinical state ?Skin: Warm & dry w/o tenting. ?No significant lesions or rash. ? ?See clinical summary under each active problem in the Problem List with associated updated therapeutic plan ? ?

## 2022-02-22 ENCOUNTER — Encounter: Payer: Self-pay | Admitting: Adult Health

## 2022-02-22 ENCOUNTER — Non-Acute Institutional Stay (SKILLED_NURSING_FACILITY): Payer: PPO | Admitting: Adult Health

## 2022-02-22 ENCOUNTER — Other Ambulatory Visit (HOSPITAL_COMMUNITY)
Admission: RE | Admit: 2022-02-22 | Discharge: 2022-02-22 | Disposition: A | Discharge status: Home / Self Care | Payer: PPO | Source: Skilled Nursing Facility | Attending: Internal Medicine | Admitting: Internal Medicine

## 2022-02-22 DIAGNOSIS — H40053 Ocular hypertension, bilateral: Secondary | ICD-10-CM

## 2022-02-22 DIAGNOSIS — E1122 Type 2 diabetes mellitus with diabetic chronic kidney disease: Secondary | ICD-10-CM | POA: Diagnosis not present

## 2022-02-22 DIAGNOSIS — F419 Anxiety disorder, unspecified: Secondary | ICD-10-CM

## 2022-02-22 DIAGNOSIS — K219 Gastro-esophageal reflux disease without esophagitis: Secondary | ICD-10-CM | POA: Diagnosis not present

## 2022-02-22 DIAGNOSIS — R1319 Other dysphagia: Secondary | ICD-10-CM

## 2022-02-22 DIAGNOSIS — G3184 Mild cognitive impairment, so stated: Secondary | ICD-10-CM | POA: Diagnosis not present

## 2022-02-22 DIAGNOSIS — E46 Unspecified protein-calorie malnutrition: Secondary | ICD-10-CM | POA: Insufficient documentation

## 2022-02-22 DIAGNOSIS — N184 Chronic kidney disease, stage 4 (severe): Secondary | ICD-10-CM

## 2022-02-22 DIAGNOSIS — E1169 Type 2 diabetes mellitus with other specified complication: Secondary | ICD-10-CM | POA: Insufficient documentation

## 2022-02-22 DIAGNOSIS — I700 Atherosclerosis of aorta: Secondary | ICD-10-CM

## 2022-02-22 DIAGNOSIS — D649 Anemia, unspecified: Secondary | ICD-10-CM

## 2022-02-22 DIAGNOSIS — E785 Hyperlipidemia, unspecified: Secondary | ICD-10-CM

## 2022-02-22 DIAGNOSIS — I129 Hypertensive chronic kidney disease with stage 1 through stage 4 chronic kidney disease, or unspecified chronic kidney disease: Secondary | ICD-10-CM

## 2022-02-22 DIAGNOSIS — E569 Vitamin deficiency, unspecified: Secondary | ICD-10-CM | POA: Diagnosis not present

## 2022-02-22 DIAGNOSIS — E441 Mild protein-calorie malnutrition: Secondary | ICD-10-CM

## 2022-02-22 DIAGNOSIS — F339 Major depressive disorder, recurrent, unspecified: Secondary | ICD-10-CM

## 2022-02-22 DIAGNOSIS — F0150 Vascular dementia without behavioral disturbance: Secondary | ICD-10-CM

## 2022-02-22 DIAGNOSIS — R600 Localized edema: Secondary | ICD-10-CM

## 2022-02-22 LAB — COMPREHENSIVE METABOLIC PANEL
ALT: 8 U/L (ref 0–44)
AST: 11 U/L — ABNORMAL LOW (ref 15–41)
Albumin: 3.2 g/dL — ABNORMAL LOW (ref 3.5–5.0)
Alkaline Phosphatase: 58 U/L (ref 38–126)
Anion gap: 10 (ref 5–15)
BUN: 29 mg/dL — ABNORMAL HIGH (ref 8–23)
CO2: 26 mmol/L (ref 22–32)
Calcium: 8.9 mg/dL (ref 8.9–10.3)
Chloride: 103 mmol/L (ref 98–111)
Creatinine, Ser: 1.75 mg/dL — ABNORMAL HIGH (ref 0.44–1.00)
GFR, Estimated: 27 mL/min — ABNORMAL LOW (ref >60)
Glucose, Bld: 226 mg/dL — ABNORMAL HIGH (ref 70–99)
Potassium: 3 mmol/L — ABNORMAL LOW (ref 3.5–5.1)
Sodium: 139 mmol/L (ref 135–145)
Total Bilirubin: 0.3 mg/dL (ref 0.3–1.2)
Total Protein: 5.8 g/dL — ABNORMAL LOW (ref 6.5–8.1)

## 2022-02-22 LAB — VITAMIN D 25 HYDROXY (VIT D DEFICIENCY, FRACTURES): Vit D, 25-Hydroxy: 71.97 ng/mL (ref 30–100)

## 2022-02-22 LAB — CBC
HCT: 32.2 % — ABNORMAL LOW (ref 36.0–46.0)
Hemoglobin: 10.5 g/dL — ABNORMAL LOW (ref 12.0–15.0)
MCH: 29.7 pg (ref 26.0–34.0)
MCHC: 32.6 g/dL (ref 30.0–36.0)
MCV: 91 fL (ref 80.0–100.0)
Platelets: 193 10*3/uL (ref 150–400)
RBC: 3.54 MIL/uL — ABNORMAL LOW (ref 3.87–5.11)
RDW: 13 % (ref 11.5–15.5)
WBC: 10.5 10*3/uL (ref 4.0–10.5)
nRBC: 0 % (ref 0.0–0.2)

## 2022-02-22 LAB — HEMOGLOBIN A1C
Hgb A1c MFr Bld: 7.7 % — ABNORMAL HIGH (ref 4.8–5.6)
Mean Plasma Glucose: 174 mg/dL

## 2022-02-22 LAB — VITAMIN B12: Vitamin B-12: 4968 pg/mL — ABNORMAL HIGH (ref 180–914)

## 2022-02-22 NOTE — Assessment & Plan Note (Signed)
Current H/H 10.5/32.2. B12 almost 5000. B12 D/Ced ?

## 2022-02-22 NOTE — Assessment & Plan Note (Signed)
Current A1c 6.6%. No change indicated. ?

## 2022-02-22 NOTE — Progress Notes (Signed)
?Location:  [place] ?Nursing Home Room Number: 154-D ?Place of Service:  SNF (31) ? ? ?CODE STATUS: DNR ? ?Allergies  ?Allergen Reactions  ? Metformin   ?  This allergy is not listed under records with [place] facility. Past records states that patient "felt poor" as a reaction to taking this medication  ? ? ?Chief Complaint  ?Patient presents with  ? Follow-up  ?  Transfer follow-up   ? ? ?HPI: ? ?She is being transferred to this facility for long term placement. She is alert to herself. She does deny any pain. States that she is feeling good. There are no reports of anxiety present. She will continue to be followed for her chronic illnesses including: Hypertension associated with stage 4 chronic kidney disease due to type 2 diabetes:  Esophageal dysphagia:  Esophageal reflux disease without esophagitis:  CKD stage 4 due to type 2 diabetes mellitus ? ?Past Medical History:  ?Diagnosis Date  ? Achilles tendinitis   ? Allergic rhinitis   ? Anemia of chronic disease   ? at least back to 2010  ? Anxiety   ? Benign paroxysmal positional vertigo   ? Cataract   ? Cerebrovascular accident [facility])   ? Chronic kidney disease (CKD), stage III (moderate) (HCC)   ? Degenerative disc disease, thoracic   ? Depression   ? Diverticulitis   ? DJD (degenerative joint disease)   ? DM (diabetes mellitus) ([place])   ? type II  ? GERD (gastroesophageal reflux disease)   ? HTN (hypertension)   ? HX: breast cancer   ? Hyperlipidemia   ? Hypertonicity of bladder   ? Knee pain, left   ? Leg pain   ? Lymphocytic colitis 2008  ? treated with Lialda short-term  ? Memory loss   ? Obesity   ? Osteoporosis, unspecified   ? Renal disease   ? stage III  ? Unspecified fall   ? Unspecified glaucoma(365.9)   ? ? ?Past Surgical History:  ?Procedure Laterality Date  ? Bladder tack  2000  ? BREAST LUMPECTOMY  2004  ? right  ? cataracts    ? CHOLECYSTECTOMY  1960  ? COLONOSCOPY  07/2007  ? lymphocytic colitis  ? ESOPHAGOGASTRODUODENOSCOPY   06/24/2011  ? Procedure: ESOPHAGOGASTRODUODENOSCOPY (EGD);  Surgeon: Dorothyann Peng, MD;  [facility];  Service: Endoscopy;  Laterality: N/A;.  Peptic stricture, mild erosive reflux esophagitis, hiatal  ? ESOPHAGOGASTRODUODENOSCOPY (EGD) WITH PROPOFOL N/A 02/15/2022  ? Procedure: ESOPHAGOGASTRODUODENOSCOPY (EGD) WITH PROPOFOL;  Surgeon: Harvel Quale, MD;  [facility];  Service: Gastroenterology;  Laterality: N/A;  ? FEMUR IM NAIL Left 04/28/2020  ? Procedure: INTRAMEDULLARY (IM) NAIL FEMORAL;  Surgeon: Marchia Bond, MD;  Location: [facility];  Service: Orthopedics;  Laterality: Left;  ? FLEXIBLE SIGMOIDOSCOPY  11/25/2011  ? MODERATE DIVERTICULOSIS/INTERNAL HEMORRHOIDS  ? S/P Hysterectomy  2000  ? benign reasons  ? SAVORY DILATION  06/24/2011  ? Procedure: SAVORY DILATION;  Surgeon: Dorothyann Peng, MD;  [facility];  Service: Endoscopy;  Laterality: N/A;  ? ? ?Social History  ? ?Socioeconomic History  ? Marital status: Widowed  ?  Spouse name: Not on file  ? Number of children: 2  ? Years of education: 74  ? Highest education level: High school graduate  ?Occupational History  ? Occupation: retired  ?  Employer: RETIRED  ?  Comment: [employer]  ?Tobacco Use  ? Smoking status: Former  ?  Packs/day:  1.00  ?  Types: Cigarettes  ? Smokeless tobacco: Never  ? Tobacco comments:  ?  30 yrs ago  ?Vaping Use  ? Vaping Use: Never used  ?Substance and Sexual Activity  ? Alcohol use: No  ? Drug use: No  ? Sexual activity: Not on file  ?Other Topics Concern  ? Not on file  ?Social History Narrative  ? Lives at [facility] in [place].  ? Right-handed.  ? 2-3 cups caffeine per day.  ? ?Social Determinants of Health  ? ?Financial Resource Strain: Not on file  ?Food Insecurity: Not on file  ?Transportation Needs: Not on file  ?Physical Activity: Not on file  ?Stress: Not on file  ?Social Connections: Not on file  ?Intimate Partner Violence: Not on file  ? ?Family  History  ?Problem Relation Age of Onset  ? Arthritis Mother   ?     died at age 39  ? Other Father   ?     died at age 75 - horse accident  ? Colon cancer Neg Hx   ?        ? Liver disease Neg Hx   ?        ? GI problems Neg Hx   ?        ? ? ? ? ?VITAL SIGNS ?BP (!) 143/80   Pulse 82   Temp 98.7 ?F (37.1 ?C)   Resp 20   Ht 5' (1.524 m)   Wt 143 lb 6.4 oz (65 kg)   SpO2 100%   BMI 28.01 kg/m?  ? ?Outpatient Encounter Medications as of 02/22/2022  ?Medication Sig  ? aspirin 81 MG tablet Take 1 tablet (81 mg total) by mouth daily with breakfast.  ? brimonidine (ALPHAGAN) 0.2 % ophthalmic solution Place 1 drop into both eyes every 8 (eight) hours.  ? Cholecalciferol (VITAMIN D) 2000 units CAPS Take 1 capsule by mouth daily.  ? clonazePAM (KLONOPIN) 0.5 MG tablet Take 0.5 mg by mouth at bedtime.  ? cycloSPORINE (RESTASIS) 0.05 % ophthalmic emulsion Place 1 drop into both eyes 2 (two) times daily.  ? dorzolamide-timolol (COSOPT) 22.3-6.8 MG/ML ophthalmic solution Place 1 drop into both eyes in the morning and at bedtime.  ? famotidine (PEPCID) 20 MG tablet Take 20 mg by mouth daily.  ? furosemide (LASIX) 20 MG tablet Take 1 tablet (20 mg total) by mouth every other day.  ? hydrALAZINE (APRESOLINE) 50 MG tablet Take 1 tablet (50 mg total) by mouth 3 (three) times daily.  ? LORazepam (ATIVAN) 0.5 MG tablet Take 1 tablet (0.5 mg total) by mouth daily as needed for anxiety.  ? mirtazapine (REMERON) 15 MG tablet Take 15 mg by mouth at bedtime.  ? pantoprazole (PROTONIX) 40 MG tablet Take 1 tablet (40 mg total) by mouth daily.  ? ROCKLATAN 0.02-0.005 % SOLN Place 1 drop into both eyes at bedtime.  ? sitaGLIPtin (JANUVIA) 25 MG tablet Take 1 tablet (25 mg total) by mouth daily.  ? UNABLE TO FIND Diet: Full liquids only  ? vitamin B-12 (CYANOCOBALAMIN) 1000 MCG tablet Take 1 tablet (1,000 mcg total) by mouth daily.  ? acetaminophen (TYLENOL) 325 MG tablet Take 2 tablets (650 mg total) by mouth every 6 (six) hours as needed  for mild pain (or Fever >/= 101).  ? amLODipine (NORVASC) 2.5 MG tablet Take 2.5 mg by mouth daily.   ? ?No facility-administered encounter medications on file as of 02/22/2022.  ? ? ? ?SIGNIFICANT DIAGNOSTIC  EXAMS ? ?LABS REVIEWED;  ? ?12-25-21: tsh 1.710 ?02-22-22; wbc 10.5; hgb 10.5; hct 32.2; mcv 91.0 plt 193; glucose 226; bun 29; creat 1.75; k+ 3.0; na++ 139; ca 8.9; GFR 27 protein 5.8; albumin 3.2 hgb a1c 7.7; vit B 12: 4968; vitamin D 71.97  ? ?Review of Systems  ?Unable to perform ROS: Dementia   ? ? ?Physical Exam ?Constitutional:   ?   General: She is not in acute distress. ?   Appearance: She is well-developed. She is not diaphoretic.  ?Neck:  ?   Thyroid: No thyromegaly.  ?Cardiovascular:  ?   Rate and Rhythm: Normal rate and regular rhythm.  ?   Pulses: Normal pulses.  ?   Heart sounds: Normal heart sounds.  ?Pulmonary:  ?   Effort: Pulmonary effort is normal. No respiratory distress.  ?   Breath sounds: Normal breath sounds.  ?Abdominal:  ?   General: Bowel sounds are normal. There is no distension.  ?   Palpations: Abdomen is soft.  ?   Tenderness: There is no abdominal tenderness.  ?Musculoskeletal:     ?   General: Normal range of motion.  ?   Cervical back: Neck supple.  ?   Right lower leg: No edema.  ?   Left lower leg: No edema.  ?Lymphadenopathy:  ?   Cervical: No cervical adenopathy.  ?Skin: ?   General: Skin is warm and dry.  ?Neurological:  ?   Mental Status: She is alert. Mental status is at baseline.  ?Psychiatric:     ?   Mood and Affect: Mood normal.  ? ? ? ?ASSESSMENT/ PLAN: ? ?TODAY ? ?Hypertension associated with stage 4 chronic kidney disease due to type 2 diabetes: b/p 143/80: will continue apresoline 50 mg three times daily  ? ?2. Esophageal dysphagia: has history of esophageal impaction: is on full liquid diet.  ? ?3. Esophageal reflux disease without esophagitis: will continue pepcid 20 mg daily will change protonix to a crushable medication ? ?4. CKD stage 4 due to type 2 diabetes  mellitus: bun 29; creat 1.75; GFR 27 ? ?5. Hyperlipidemia associated with type 2 diabetes mellitus: will monitor no statin due to advanced age ? ?6. Type 2 diabetes mellitus with stage 4 chronic kidney disease and

## 2022-02-22 NOTE — Assessment & Plan Note (Signed)
Current creat 1.75 & GFR 27, CKD Stage 4.  ?Medication List reviewed. No nephrotoxic agents identified. ? ? ?

## 2022-02-22 NOTE — Assessment & Plan Note (Signed)
Current total protein 5.8, albumin 3.2 ?Mild protein caloric malnutrition ?Nutrition consult at SNF ? ?

## 2022-02-22 NOTE — Patient Instructions (Signed)
See assessment and plan under each diagnosis in the problem list and acutely for this visit 

## 2022-02-22 NOTE — Assessment & Plan Note (Addendum)
She is demented & has no comprehension of GI issues & risk. NP switched all meds to liquid or crushable formulations. ?

## 2022-02-24 ENCOUNTER — Other Ambulatory Visit: Payer: Self-pay | Admitting: Adult Health

## 2022-02-24 MED ORDER — CLONAZEPAM 0.5 MG PO TABS
0.5000 mg | ORAL_TABLET | Freq: Every evening | ORAL | 0 refills | Status: DC
Start: 2022-02-24 — End: 2022-03-25

## 2022-03-01 DIAGNOSIS — F0150 Vascular dementia without behavioral disturbance: Secondary | ICD-10-CM | POA: Insufficient documentation

## 2022-03-01 DIAGNOSIS — I700 Atherosclerosis of aorta: Secondary | ICD-10-CM | POA: Insufficient documentation

## 2022-03-01 DIAGNOSIS — E1122 Type 2 diabetes mellitus with diabetic chronic kidney disease: Secondary | ICD-10-CM | POA: Insufficient documentation

## 2022-03-11 ENCOUNTER — Non-Acute Institutional Stay (SKILLED_NURSING_FACILITY): Payer: PPO | Admitting: Adult Health

## 2022-03-11 ENCOUNTER — Encounter: Payer: Self-pay | Admitting: Adult Health

## 2022-03-11 DIAGNOSIS — I700 Atherosclerosis of aorta: Secondary | ICD-10-CM | POA: Diagnosis not present

## 2022-03-11 DIAGNOSIS — F0150 Vascular dementia without behavioral disturbance: Secondary | ICD-10-CM | POA: Diagnosis not present

## 2022-03-11 DIAGNOSIS — I129 Hypertensive chronic kidney disease with stage 1 through stage 4 chronic kidney disease, or unspecified chronic kidney disease: Secondary | ICD-10-CM | POA: Diagnosis not present

## 2022-03-11 DIAGNOSIS — N184 Chronic kidney disease, stage 4 (severe): Secondary | ICD-10-CM

## 2022-03-11 DIAGNOSIS — E1122 Type 2 diabetes mellitus with diabetic chronic kidney disease: Secondary | ICD-10-CM

## 2022-03-11 NOTE — Progress Notes (Signed)
?Location:  [place] ?Nursing Home Room Number: 154-D ?Place of Service:  SNF (31) ? ? ?CODE STATUS: DNR ? ?Allergies  ?Allergen Reactions  ? Metformin   ?  This allergy is not listed under records with [facility] facility. Past records states that patient "felt poor" as a reaction to taking this medication  ? ? ?Chief Complaint  ?Patient presents with  ? Acute Visit  ?  Care plan meeting  ? ? ?HPI: ? ?We have come together for her care plan meeting. Family present  BIMS 4/15 mood 0/30: takes off clothes will spit. She requires limited to extensive assist with adl care. She is incontinent of bladder and bowel. She is nonambulatory has had one fall without injury. Dietary: on full liquid diet; has ensure; weight is 135.8 pounds admission weight 143.4 pounds; has poor appetite; will try lower dose of remeron to help with appetite.  Therapy: none at this time.  She continues to be followed for her chronic illnesses including:   Aortic atherosclerosis   Hypertension associated with stage 4 chronic kidney disease due to diabetes type 2 diabetes   Vascular dementia without behavioral disturbance ? ?Past Medical History:  ?Diagnosis Date  ? Achilles tendinitis   ? Allergic rhinitis   ? Anemia of chronic disease   ? at least back to 2010  ? Anxiety   ? Benign paroxysmal positional vertigo   ? Cataract   ? Cerebrovascular accident [facility])   ? Chronic kidney disease (CKD), stage III (moderate) (HCC)   ? Degenerative disc disease, thoracic   ? Depression   ? Diverticulitis   ? DJD (degenerative joint disease)   ? DM (diabetes mellitus) ([place])   ? type II  ? GERD (gastroesophageal reflux disease)   ? HTN (hypertension)   ? HX: breast cancer   ? Hyperlipidemia   ? Hypertonicity of bladder   ? Knee pain, left   ? Leg pain   ? Lymphocytic colitis 2008  ? treated with Lialda short-term  ? Memory loss   ? Obesity   ? Osteoporosis, unspecified   ? Renal disease   ? stage III  ? Unspecified fall   ? Unspecified  glaucoma(365.9)   ? ? ?Past Surgical History:  ?Procedure Laterality Date  ? Bladder tack  2000  ? BREAST LUMPECTOMY  2004  ? right  ? cataracts    ? CHOLECYSTECTOMY  1960  ? COLONOSCOPY  07/2007  ? lymphocytic colitis  ? ESOPHAGOGASTRODUODENOSCOPY  06/24/2011  ? Procedure: ESOPHAGOGASTRODUODENOSCOPY (EGD);  Surgeon: Dorothyann Peng, MD;  [facility];  Service: Endoscopy;  Laterality: N/A;.  Peptic stricture, mild erosive reflux esophagitis, hiatal  ? ESOPHAGOGASTRODUODENOSCOPY (EGD) WITH PROPOFOL N/A 02/15/2022  ? Procedure: ESOPHAGOGASTRODUODENOSCOPY (EGD) WITH PROPOFOL;  Surgeon: Harvel Quale, MD;  [facility];  Service: Gastroenterology;  Laterality: N/A;  ? FEMUR IM NAIL Left 04/28/2020  ? Procedure: INTRAMEDULLARY (IM) NAIL FEMORAL;  Surgeon: Marchia Bond, MD;  Location: [facility];  Service: Orthopedics;  Laterality: Left;  ? FLEXIBLE SIGMOIDOSCOPY  11/25/2011  ? MODERATE DIVERTICULOSIS/INTERNAL HEMORRHOIDS  ? S/P Hysterectomy  2000  ? benign reasons  ? SAVORY DILATION  06/24/2011  ? Procedure: SAVORY DILATION;  Surgeon: Dorothyann Peng, MD;  [facility];  Service: Endoscopy;  Laterality: N/A;  ? ? ?Social History  ? ?Socioeconomic History  ? Marital status: Widowed  ?  Spouse name: Not on file  ? Number of children: 2  ? Years  of education: 12  ? Highest education level: High school graduate  ?Occupational History  ? Occupation: retired  ?  Employer: RETIRED  ?  Comment: [employer]  ?Tobacco Use  ? Smoking status: Former  ?  Packs/day: 1.00  ?  Types: Cigarettes  ? Smokeless tobacco: Never  ? Tobacco comments:  ?  30 yrs ago  ?Vaping Use  ? Vaping Use: Never used  ?Substance and Sexual Activity  ? Alcohol use: No  ? Drug use: No  ? Sexual activity: Not on file  ?Other Topics Concern  ? Not on file  ?Social History Narrative  ? Lives at [facility] in [place].  ? Right-handed.  ? 2-3 cups caffeine per day.  ? ?Social Determinants of Health   ? ?Financial Resource Strain: Not on file  ?Food Insecurity: Not on file  ?Transportation Needs: Not on file  ?Physical Activity: Not on file  ?Stress: Not on file  ?Social Connections: Not on file  ?Intimate Partner Violence: Not on file  ? ?Family History  ?Problem Relation Age of Onset  ? Arthritis Mother   ?     died at age 66  ? Other Father   ?     died at age 39 - horse accident  ? Colon cancer Neg Hx   ?        ? Liver disease Neg Hx   ?        ? GI problems Neg Hx   ?        ? ? ? ? ?VITAL SIGNS ?BP (!) 148/73   Pulse 64   Temp (!) 97.3 ?F (36.3 ?C)   Resp 20   Ht 5' (1.524 m)   Wt 136 lb 9.6 oz (62 kg)   SpO2 97%   BMI 26.68 kg/m?  ? ?Outpatient Encounter Medications as of 03/11/2022  ?Medication Sig  ? aspirin 81 MG tablet Take 1 tablet (81 mg total) by mouth daily with breakfast.  ? brimonidine (ALPHAGAN) 0.2 % ophthalmic solution Place 1 drop into both eyes every 8 (eight) hours.  ? clonazePAM (KLONOPIN) 0.5 MG tablet Take 1 tablet (0.5 mg total) by mouth at bedtime.  ? cycloSPORINE (RESTASIS) 0.05 % ophthalmic emulsion Place 1 drop into both eyes 2 (two) times daily.  ? dorzolamide-timolol (COSOPT) 22.3-6.8 MG/ML ophthalmic solution Place 1 drop into both eyes in the morning and at bedtime.  ? famotidine (PEPCID) 20 MG tablet Take 20 mg by mouth daily.  ? furosemide (LASIX) 20 MG tablet Take 1 tablet (20 mg total) by mouth every other day.  ? hydrALAZINE (APRESOLINE) 50 MG tablet Take 1 tablet (50 mg total) by mouth 3 (three) times daily.  ? melatonin 3 MG TABS tablet Take 3 mg by mouth at bedtime.  ? mirtazapine (REMERON) 15 MG tablet Take 15 mg by mouth at bedtime.  ? Nutritional Supplements (ENSURE ENLIVE PO) Take by mouth. Three Times A Day with meals due to full liquid diet.  ?Special Instructions: Resident prefers it to be cold. Please provide with ice.  ? omeprazole (PRILOSEC) 40 MG capsule Take 40 mg by mouth daily.  ? ROCKLATAN 0.02-0.005 % SOLN Place 1 drop into both eyes at bedtime.   ? sitaGLIPtin (JANUVIA) 25 MG tablet Take 1 tablet (25 mg total) by mouth daily.  ? UNABLE TO FIND Diet: Full liquids only  ? acetaminophen (TYLENOL) 325 MG tablet Take 2 tablets (650 mg total) by mouth every 6 (six) hours  as needed for mild pain (or Fever >/= 101). (Patient not taking: Reported on 03/11/2022)  ? Cholecalciferol (VITAMIN D) 2000 units CAPS Take 1 capsule by mouth daily. (Patient not taking: Reported on 03/11/2022)  ? LORazepam (ATIVAN) 0.5 MG tablet Take 1 tablet (0.5 mg total) by mouth daily as needed for anxiety. (Patient not taking: Reported on 03/11/2022)  ? pantoprazole (PROTONIX) 40 MG tablet Take 1 tablet (40 mg total) by mouth daily. (Patient not taking: Reported on 03/11/2022)  ? vitamin B-12 (CYANOCOBALAMIN) 1000 MCG tablet Take 1 tablet (1,000 mcg total) by mouth daily. (Patient not taking: Reported on 03/11/2022)  ? [DISCONTINUED] amLODipine (NORVASC) 2.5 MG tablet Take 2.5 mg by mouth daily. (Patient not taking: Reported on 02/15/2022)  ? ?No facility-administered encounter medications on file as of 03/11/2022.  ? ? ? ?SIGNIFICANT DIAGNOSTIC EXAMS ? ? ?LABS REVIEWED; PREVIOUS  ? ?12-25-21: tsh 1.710 ?02-22-22; wbc 10.5; hgb 10.5; hct 32.2; mcv 91.0 plt 193; glucose 226; bun 29; creat 1.75; k+ 3.0; na++ 139; ca 8.9; GFR 27 protein 5.8; albumin 3.2 hgb a1c 7.7; vit B 12: 4968; vitamin D 71.97  ? ?NO NEW LABS.  ? ?Review of Systems  ?Unable to perform ROS: Dementia  ? ?Physical Exam ?Constitutional:   ?   General: She is not in acute distress. ?   Appearance: She is well-developed. She is not diaphoretic.  ?Neck:  ?   Thyroid: No thyromegaly.  ?Cardiovascular:  ?   Rate and Rhythm: Normal rate and regular rhythm.  ?   Pulses: Normal pulses.  ?   Heart sounds: Normal heart sounds.  ?Pulmonary:  ?   Effort: Pulmonary effort is normal. No respiratory distress.  ?   Breath sounds: Normal breath sounds.  ?Abdominal:  ?   General: Bowel sounds are normal. There is no distension.  ?   Palpations: Abdomen  is soft.  ?   Tenderness: There is no abdominal tenderness.  ?Musculoskeletal:     ?   General: Normal range of motion.  ?   Cervical back: Neck supple.  ?   Right lower leg: No edema.  ?   Left lower leg: No edema.  ?Lymp

## 2022-03-22 ENCOUNTER — Encounter: Payer: Self-pay | Admitting: Adult Health

## 2022-03-22 ENCOUNTER — Non-Acute Institutional Stay (SKILLED_NURSING_FACILITY): Payer: PPO | Admitting: Adult Health

## 2022-03-22 DIAGNOSIS — K219 Gastro-esophageal reflux disease without esophagitis: Secondary | ICD-10-CM

## 2022-03-22 DIAGNOSIS — K222 Esophageal obstruction: Secondary | ICD-10-CM | POA: Diagnosis not present

## 2022-03-22 DIAGNOSIS — I129 Hypertensive chronic kidney disease with stage 1 through stage 4 chronic kidney disease, or unspecified chronic kidney disease: Secondary | ICD-10-CM

## 2022-03-22 DIAGNOSIS — R1319 Other dysphagia: Secondary | ICD-10-CM | POA: Diagnosis not present

## 2022-03-22 DIAGNOSIS — E1122 Type 2 diabetes mellitus with diabetic chronic kidney disease: Secondary | ICD-10-CM

## 2022-03-22 DIAGNOSIS — R634 Abnormal weight loss: Secondary | ICD-10-CM | POA: Insufficient documentation

## 2022-03-22 DIAGNOSIS — N184 Chronic kidney disease, stage 4 (severe): Secondary | ICD-10-CM

## 2022-03-22 DIAGNOSIS — T18128A Food in esophagus causing other injury, initial encounter: Secondary | ICD-10-CM

## 2022-03-22 NOTE — Progress Notes (Signed)
?Location:  [facility] ?Nursing Home Room Number: 154-D ?Place of Service:  SNF (31) ? ? ?CODE STATUS: DNR ? ?Allergies  ?Allergen Reactions  ? Metformin   ?  This allergy is not listed under records with [facility] facility. Past records states that patient "felt poor" as a reaction to taking this medication  ? ? ?Chief Complaint  ?Patient presents with  ? Medical Management of Chronic Issues  ?                          Hypertension associated with stage 4 chronic kidney disease due to type 2 diabetes:  Esophageal dysphagia history of esophageal impaction:  Esophageal reflux disease without esophagitis: CKD stage 4 due type 2 diabetes mellitus:   ? ? ?HPI: ? ?She is a 88 year old long term resident of this facility being seen for the management of her chronic illnesses:  Hypertension associated with stage 4 chronic kidney disease due to type 2 diabetes:  Esophageal dysphagia history of esophageal impaction:  Esophageal reflux disease without esophagitis: CKD stage 4 due type 2 diabetes mellitus: there are no reports of uncontrolled pain. There are no reports of aspiration present. No report of coughing with meals.  ? ?Past Medical History:  ?Diagnosis Date  ? Achilles tendinitis   ? Allergic rhinitis   ? Anemia of chronic disease   ? at least back to 2010  ? Anxiety   ? Benign paroxysmal positional vertigo   ? Cataract   ? Cerebrovascular accident [facility])   ? Chronic kidney disease (CKD), stage III (moderate) (HCC)   ? Degenerative disc disease, thoracic   ? Depression   ? Diverticulitis   ? DJD (degenerative joint disease)   ? DM (diabetes mellitus) ([place])   ? type II  ? GERD (gastroesophageal reflux disease)   ? HTN (hypertension)   ? HX: breast cancer   ? Hyperlipidemia   ? Hypertonicity of bladder   ? Knee pain, left   ? Leg pain   ? Lymphocytic colitis 2008  ? treated with Lialda short-term  ? Memory loss   ? Obesity   ? Osteoporosis, unspecified   ? Renal disease   ? stage III  ? Unspecified  fall   ? Unspecified glaucoma(365.9)   ? ? ?Past Surgical History:  ?Procedure Laterality Date  ? Bladder tack  2000  ? BREAST LUMPECTOMY  2004  ? right  ? cataracts    ? CHOLECYSTECTOMY  1960  ? COLONOSCOPY  07/2007  ? lymphocytic colitis  ? ESOPHAGOGASTRODUODENOSCOPY  06/24/2011  ? Procedure: ESOPHAGOGASTRODUODENOSCOPY (EGD);  Surgeon: Dorothyann Peng, MD;  [facility];  Service: Endoscopy;  Laterality: N/A;.  Peptic stricture, mild erosive reflux esophagitis, hiatal  ? ESOPHAGOGASTRODUODENOSCOPY (EGD) WITH PROPOFOL N/A 02/15/2022  ? Procedure: ESOPHAGOGASTRODUODENOSCOPY (EGD) WITH PROPOFOL;  Surgeon: Harvel Quale, MD;  [facility];  Service: Gastroenterology;  Laterality: N/A;  ? FEMUR IM NAIL Left 04/28/2020  ? Procedure: INTRAMEDULLARY (IM) NAIL FEMORAL;  Surgeon: Marchia Bond, MD;  Location: [facility];  Service: Orthopedics;  Laterality: Left;  ? FLEXIBLE SIGMOIDOSCOPY  11/25/2011  ? MODERATE DIVERTICULOSIS/INTERNAL HEMORRHOIDS  ? S/P Hysterectomy  2000  ? benign reasons  ? SAVORY DILATION  06/24/2011  ? Procedure: SAVORY DILATION;  Surgeon: Dorothyann Peng, MD;  [facility];  Service: Endoscopy;  Laterality: N/A;  ? ? ?Social History  ? ?Socioeconomic History  ? Marital status:  Widowed  ?  Spouse name: Not on file  ? Number of children: 2  ? Years of education: 15  ? Highest education level: High school graduate  ?Occupational History  ? Occupation: retired  ?  Employer: RETIRED  ?  Comment: [place]  ?Tobacco Use  ? Smoking status: Former  ?  Packs/day: 1.00  ?  Types: Cigarettes  ? Smokeless tobacco: Never  ? Tobacco comments:  ?  30 yrs ago  ?Vaping Use  ? Vaping Use: Never used  ?Substance and Sexual Activity  ? Alcohol use: No  ? Drug use: No  ? Sexual activity: Not on file  ?Other Topics Concern  ? Not on file  ?Social History Narrative  ? Lives at [facility] in [place].  ? Right-handed.  ? 2-3 cups caffeine per day.  ? ?Social  Determinants of Health  ? ?Financial Resource Strain: Not on file  ?Food Insecurity: Not on file  ?Transportation Needs: Not on file  ?Physical Activity: Not on file  ?Stress: Not on file  ?Social Connections: Not on file  ?Intimate Partner Violence: Not on file  ? ?Family History  ?Problem Relation Age of Onset  ? Arthritis Mother   ?     died at age 72  ? Other Father   ?     died at age 66 - horse accident  ? Colon cancer Neg Hx   ?        ? Liver disease Neg Hx   ?        ? GI problems Neg Hx   ?        ? ? ? ? ?VITAL SIGNS ?BP 133/74   Pulse 70   Temp (!) 96.3 ?F (35.7 ?C)   Resp 20   Ht 5' (1.524 m)   Wt 131 lb 6.4 oz (59.6 kg)   SpO2 97%   BMI 25.66 kg/m?  ? ?Outpatient Encounter Medications as of 03/22/2022  ?Medication Sig  ? aspirin 81 MG tablet Take 1 tablet (81 mg total) by mouth daily with breakfast.  ? brimonidine (ALPHAGAN) 0.2 % ophthalmic solution Place 1 drop into both eyes every 8 (eight) hours.  ? clonazePAM (KLONOPIN) 0.5 MG tablet Take 1 tablet (0.5 mg total) by mouth at bedtime.  ? cycloSPORINE (RESTASIS) 0.05 % ophthalmic emulsion Place 1 drop into both eyes 2 (two) times daily.  ? dorzolamide-timolol (COSOPT) 22.3-6.8 MG/ML ophthalmic solution Place 1 drop into both eyes in the morning and at bedtime.  ? famotidine (PEPCID) 20 MG tablet Take 20 mg by mouth daily.  ? furosemide (LASIX) 20 MG tablet Take 1 tablet (20 mg total) by mouth every other day.  ? hydrALAZINE (APRESOLINE) 50 MG tablet Take 1 tablet (50 mg total) by mouth 3 (three) times daily.  ? melatonin 3 MG TABS tablet Take 3 mg by mouth at bedtime.  ? mirtazapine (REMERON) 15 MG tablet Take 15 mg by mouth at bedtime.  ? Nutritional Supplements (ENSURE ENLIVE PO) Take by mouth. Three Times A Day with meals due to full liquid diet.  ?Special Instructions: Resident prefers it to be cold. Please provide with ice.  ? omeprazole (PRILOSEC) 40 MG capsule Take 40 mg by mouth daily.  ? ROCKLATAN 0.02-0.005 % SOLN Place 1 drop into  both eyes at bedtime.  ? sitaGLIPtin (JANUVIA) 25 MG tablet Take 1 tablet (25 mg total) by mouth daily.  ? UNABLE TO FIND Diet: Full liquids only  ? [  DISCONTINUED] acetaminophen (TYLENOL) 325 MG tablet Take 2 tablets (650 mg total) by mouth every 6 (six) hours as needed for mild pain (or Fever >/= 101). (Patient not taking: Reported on 03/11/2022)  ? [DISCONTINUED] Cholecalciferol (VITAMIN D) 2000 units CAPS Take 1 capsule by mouth daily. (Patient not taking: Reported on 03/11/2022)  ? [DISCONTINUED] LORazepam (ATIVAN) 0.5 MG tablet Take 1 tablet (0.5 mg total) by mouth daily as needed for anxiety. (Patient not taking: Reported on 03/11/2022)  ? [DISCONTINUED] pantoprazole (PROTONIX) 40 MG tablet Take 1 tablet (40 mg total) by mouth daily. (Patient not taking: Reported on 03/11/2022)  ? [DISCONTINUED] vitamin B-12 (CYANOCOBALAMIN) 1000 MCG tablet Take 1 tablet (1,000 mcg total) by mouth daily. (Patient not taking: Reported on 03/11/2022)  ? ?No facility-administered encounter medications on file as of 03/22/2022.  ? ? ? ?SIGNIFICANT DIAGNOSTIC EXAMS ? ?LABS REVIEWED; PREVIOUS  ? ?12-25-21: tsh 1.710 ?02-22-22; wbc 10.5; hgb 10.5; hct 32.2; mcv 91.0 plt 193; glucose 226; bun 29; creat 1.75; k+ 3.0; na++ 139; ca 8.9; GFR 27 protein 5.8; albumin 3.2 hgb a1c 7.7; vit B 12: 4968; vitamin D 71.97  ? ?NO NEW LABS.  ? ?Review of Systems  ?Unable to perform ROS: Dementia  ? ? ?Physical Exam ?Constitutional:   ?   General: She is not in acute distress. ?   Appearance: She is well-developed. She is not diaphoretic.  ?Neck:  ?   Thyroid: No thyromegaly.  ?Cardiovascular:  ?   Rate and Rhythm: Normal rate and regular rhythm.  ?   Pulses: Normal pulses.  ?   Heart sounds: Normal heart sounds.  ?Pulmonary:  ?   Effort: Pulmonary effort is normal. No respiratory distress.  ?   Breath sounds: Normal breath sounds.  ?Abdominal:  ?   General: Bowel sounds are normal. There is no distension.  ?   Palpations: Abdomen is soft.  ?   Tenderness:  There is no abdominal tenderness.  ?Musculoskeletal:     ?   General: Normal range of motion.  ?   Cervical back: Neck supple.  ?   Right lower leg: No edema.  ?   Left lower leg: No edema.  ?Lymphadenopathy:  ?   Cervical: N

## 2022-03-25 ENCOUNTER — Other Ambulatory Visit: Payer: Self-pay | Admitting: Adult Health

## 2022-03-25 MED ORDER — CLONAZEPAM 0.5 MG PO TABS
0.5000 mg | ORAL_TABLET | Freq: Every evening | ORAL | 0 refills | Status: DC
Start: 2022-03-25 — End: 2022-04-27

## 2022-04-01 ENCOUNTER — Other Ambulatory Visit (HOSPITAL_COMMUNITY)
Admission: RE | Admit: 2022-04-01 | Discharge: 2022-04-01 | Disposition: A | Discharge status: Home / Self Care | Payer: PPO | Source: Skilled Nursing Facility | Attending: Adult Health | Admitting: Adult Health

## 2022-04-01 DIAGNOSIS — M6281 Muscle weakness (generalized): Secondary | ICD-10-CM | POA: Insufficient documentation

## 2022-04-01 LAB — POTASSIUM: Potassium: 3.2 mmol/L — ABNORMAL LOW (ref 3.5–5.1)

## 2022-04-04 ENCOUNTER — Non-Acute Institutional Stay (SKILLED_NURSING_FACILITY): Payer: PPO | Admitting: Adult Health

## 2022-04-04 ENCOUNTER — Encounter: Payer: Self-pay | Admitting: Adult Health

## 2022-04-04 DIAGNOSIS — E876 Hypokalemia: Secondary | ICD-10-CM | POA: Diagnosis not present

## 2022-04-04 NOTE — Progress Notes (Signed)
?Location:  [facility] ?Nursing Home Room Number: 154 D ?Place of Service:  SNF (31) ?Provider: Ok Edwards, NP ? ? ?CODE STATUS: DNR ? ?Allergies  ?Allergen Reactions  ? Metformin   ?  This allergy is not listed under records with [facility] facility. Past records states that patient "felt poor" as a reaction to taking this medication  ? ? ?Chief Complaint  ?Patient presents with  ? Acute Visit  ?  Follow up labs  ? ? ?HPI: ? ?Her k+ is 3.2 she will need k+ supplementation. There are no physical indications of hypokalemia.  ? ?Past Medical History:  ?Diagnosis Date  ? Achilles tendinitis   ? Allergic rhinitis   ? Anemia of chronic disease   ? at least back to 2010  ? Anxiety   ? Benign paroxysmal positional vertigo   ? Cataract   ? Cerebrovascular accident [facility])   ? Chronic kidney disease (CKD), stage III (moderate) (HCC)   ? Degenerative disc disease, thoracic   ? Depression   ? Diverticulitis   ? DJD (degenerative joint disease)   ? DM (diabetes mellitus) ([place])   ? type II  ? GERD (gastroesophageal reflux disease)   ? HTN (hypertension)   ? HX: breast cancer   ? Hyperlipidemia   ? Hypertonicity of bladder   ? Knee pain, left   ? Leg pain   ? Lymphocytic colitis 2008  ? treated with Lialda short-term  ? Memory loss   ? Obesity   ? Osteoporosis, unspecified   ? Renal disease   ? stage III  ? Unspecified fall   ? Unspecified glaucoma(365.9)   ? ? ?Past Surgical History:  ?Procedure Laterality Date  ? Bladder tack  2000  ? BREAST LUMPECTOMY  2004  ? right  ? cataracts    ? CHOLECYSTECTOMY  1960  ? COLONOSCOPY  07/2007  ? lymphocytic colitis  ? ESOPHAGOGASTRODUODENOSCOPY  06/24/2011  ? Procedure: ESOPHAGOGASTRODUODENOSCOPY (EGD);  Surgeon: Dorothyann Peng, MD;  [facility];  Service: Endoscopy;  Laterality: N/A;.  Peptic stricture, mild erosive reflux esophagitis, hiatal  ? ESOPHAGOGASTRODUODENOSCOPY (EGD) WITH PROPOFOL N/A 02/15/2022  ? Procedure: ESOPHAGOGASTRODUODENOSCOPY (EGD) WITH  PROPOFOL;  Surgeon: Harvel Quale, MD;  [facility];  Service: Gastroenterology;  Laterality: N/A;  ? FEMUR IM NAIL Left 04/28/2020  ? Procedure: INTRAMEDULLARY (IM) NAIL FEMORAL;  Surgeon: Marchia Bond, MD;  [facility];  Service: Orthopedics;  Laterality: Left;  ? FLEXIBLE SIGMOIDOSCOPY  11/25/2011  ? MODERATE DIVERTICULOSIS/INTERNAL HEMORRHOIDS  ? S/P Hysterectomy  2000  ? benign reasons  ? SAVORY DILATION  06/24/2011  ? Procedure: SAVORY DILATION;  Surgeon: Dorothyann Peng, MD;  [facility];  Service: Endoscopy;  Laterality: N/A;  ? ? ?Social History  ? ?Socioeconomic History  ? Marital status: Widowed  ?  Spouse name: Not on file  ? Number of children: 2  ? Years of education: 54  ? Highest education level: High school graduate  ?Occupational History  ? Occupation: retired  ?  Employer: RETIRED  ?  Comment: [place]  ?Tobacco Use  ? Smoking status: Former  ?  Packs/day: 1.00  ?  Types: Cigarettes  ? Smokeless tobacco: Never  ? Tobacco comments:  ?  30 yrs ago  ?Vaping Use  ? Vaping Use: Never used  ?Substance and Sexual Activity  ? Alcohol use: No  ? Drug use: No  ? Sexual activity: Not on file  ?Other  Topics Concern  ? Not on file  ?Social History Narrative  ? Lives [facility] in [place].  ? Right-handed.  ? 2-3 cups caffeine per day.  ? ?Social Determinants of Health  ? ?Financial Resource Strain: Not on file  ?Food Insecurity: Not on file  ?Transportation Needs: Not on file  ?Physical Activity: Not on file  ?Stress: Not on file  ?Social Connections: Not on file  ?Intimate Partner Violence: Not on file  ? ?Family History  ?Problem Relation Age of Onset  ? Arthritis Mother   ?     died at age 68  ? Other Father   ?     died at age 29 - horse accident  ? Colon cancer Neg Hx   ?        ? Liver disease Neg Hx   ?        ? GI problems Neg Hx   ?        ? ? ? ? ?VITAL SIGNS ?BP 131/74   Pulse 68   Ht 5' (1.524 m)   Wt 128 lb 11.2 oz (58.4 kg)    BMI 25.13 kg/m?  ? ?Outpatient Encounter Medications as of 04/04/2022  ?Medication Sig  ? aspirin 81 MG tablet Take 1 tablet (81 mg total) by mouth daily with breakfast.  ? brimonidine (ALPHAGAN) 0.2 % ophthalmic solution Place 1 drop into both eyes every 8 (eight) hours.  ? clonazePAM (KLONOPIN) 0.5 MG tablet Take 1 tablet (0.5 mg total) by mouth at bedtime.  ? cycloSPORINE (RESTASIS) 0.05 % ophthalmic emulsion Place 1 drop into both eyes 2 (two) times daily.  ? dorzolamide-timolol (COSOPT) 22.3-6.8 MG/ML ophthalmic solution Place 1 drop into both eyes in the morning and at bedtime.  ? famotidine (PEPCID) 20 MG tablet Take 20 mg by mouth daily.  ? furosemide (LASIX) 20 MG tablet Take 1 tablet (20 mg total) by mouth every other day.  ? hydrALAZINE (APRESOLINE) 50 MG tablet Take 1 tablet (50 mg total) by mouth 3 (three) times daily.  ? melatonin 3 MG TABS tablet Take 3 mg by mouth at bedtime.  ? mirtazapine (REMERON) 7.5 MG tablet Take 7.5 mg by mouth at bedtime.  ? Nutritional Supplements (ENSURE ENLIVE PO) Take by mouth. Three Times A Day with meals due to full liquid diet.  ?Special Instructions: Resident prefers it to be cold. Please provide with ice.  ? omeprazole (PRILOSEC) 40 MG capsule Take 40 mg by mouth daily.  ? ROCKLATAN 0.02-0.005 % SOLN Place 1 drop into both eyes at bedtime.  ? sitaGLIPtin (JANUVIA) 25 MG tablet Take 1 tablet (25 mg total) by mouth daily.  ? UNABLE TO FIND Diet: Full liquids only  ? ?No facility-administered encounter medications on file as of 04/04/2022.  ? ? ? ?SIGNIFICANT DIAGNOSTIC EXAMS ? ? ?LABS REVIEWED; PREVIOUS  ? ?12-25-21: tsh 1.710 ?02-22-22; wbc 10.5; hgb 10.5; hct 32.2; mcv 91.0 plt 193; glucose 226; bun 29; creat 1.75; k+ 3.0; na++ 139; ca 8.9; GFR 27 protein 5.8; albumin 3.2 hgb a1c 7.7; vit B 12: 4968; vitamin D 71.97  ? ?TODAY ? ?04-01-22: k+ 3.2  ? ?Review of Systems  ?Unable to perform ROS: Dementia  ? ? ?Physical Exam ?Constitutional:   ?   General: She is not in acute  distress. ?   Appearance: She is well-developed. She is not diaphoretic.  ?Neck:  ?   Thyroid: No thyromegaly.  ?Cardiovascular:  ?   Rate and Rhythm:  Normal rate and regular rhythm.  ?   Pulses: Normal pulses.  ?   Heart sounds: Normal heart sounds.  ?Pulmonary:  ?   Effort: Pulmonary effort is normal. No respiratory distress.  ?   Breath sounds: Normal breath sounds.  ?Abdominal:  ?   General: Bowel sounds are normal. There is no distension.  ?   Palpations: Abdomen is soft.  ?   Tenderness: There is no abdominal tenderness.  ?Musculoskeletal:     ?   General: Normal range of motion.  ?   Cervical back: Neck supple.  ?   Right lower leg: No edema.  ?   Left lower leg: No edema.  ?Lymphadenopathy:  ?   Cervical: No cervical adenopathy.  ?Skin: ?   General: Skin is warm and dry.  ?Neurological:  ?   Mental Status: She is alert. Mental status is at baseline.  ?Psychiatric:     ?   Mood and Affect: Mood normal.  ? ? ? ? ?ASSESSMENT/ PLAN: ? ?TODAY ? ?Hypokalemia: worse; will begin k+ 20 meq daily and will repeat k+ on 04-11-22.  ? ? ?Ok Edwards NP ?[facility]  ?call [phone]  ? ?

## 2022-04-07 ENCOUNTER — Non-Acute Institutional Stay (SKILLED_NURSING_FACILITY): Payer: PPO | Admitting: Adult Health

## 2022-04-07 ENCOUNTER — Encounter: Payer: Self-pay | Admitting: Adult Health

## 2022-04-07 DIAGNOSIS — R627 Adult failure to thrive: Secondary | ICD-10-CM | POA: Diagnosis not present

## 2022-04-07 DIAGNOSIS — E440 Moderate protein-calorie malnutrition: Secondary | ICD-10-CM

## 2022-04-07 NOTE — Progress Notes (Signed)
Location:  [place] Room Number: 154-D Place of Service:  SNF (31)   CODE STATUS: DNR  Allergies  Allergen Reactions   Metformin     This allergy is not listed under records with [facility] facility. Past records states that patient "felt poor" as a reaction to taking this medication    Chief Complaint  Patient presents with   Acute Visit    Weight loss    HPI:  She is losing weight. On 03-27-22 weight was 137.2 pounds current weight is 137.8 pounds. She is presently on remeron 7.5 mg nightly to help stimulate her appetite. This medication has not been effective. She remains on a full liquid diet due to history of an impacted esophagus.   Past Medical History:  Diagnosis Date   Achilles tendinitis    Allergic rhinitis    Anemia of chronic disease    at least back to 2010   Anxiety    Benign paroxysmal positional vertigo    Cataract    Cerebrovascular accident [facility])    Chronic kidney disease (CKD), stage III (moderate) (HCC)    Degenerative disc disease, thoracic    Depression    Diverticulitis    DJD (degenerative joint disease)    DM (diabetes mellitus) ([place])    type II   GERD (gastroesophageal reflux disease)    HTN (hypertension)    HX: breast cancer    Hyperlipidemia    Hypertonicity of bladder    Knee pain, left    Leg pain    Lymphocytic colitis 2008   treated with Lialda short-term   Memory loss    Obesity    Osteoporosis, unspecified    Renal disease    stage III   Unspecified fall    Unspecified glaucoma(365.9)     Past Surgical History:  Procedure Laterality Date   Bladder tack  2000   BREAST LUMPECTOMY  2004   right   cataracts     CHOLECYSTECTOMY  1960   COLONOSCOPY  07/2007   lymphocytic colitis   ESOPHAGOGASTRODUODENOSCOPY  06/24/2011   Procedure: ESOPHAGOGASTRODUODENOSCOPY (EGD);  Surgeon: Dorothyann Peng, MD;  [facility];  Service: Endoscopy;  Laterality: N/A;.  Peptic stricture, mild erosive  reflux esophagitis, hiatal   ESOPHAGOGASTRODUODENOSCOPY (EGD) WITH PROPOFOL N/A 02/15/2022   Procedure: ESOPHAGOGASTRODUODENOSCOPY (EGD) WITH PROPOFOL;  Surgeon: Harvel Quale, MD;  [facility];  Service: Gastroenterology;  Laterality: N/A;   FEMUR IM NAIL Left 04/28/2020   Procedure: INTRAMEDULLARY (IM) NAIL FEMORAL;  Surgeon: Marchia Bond, MD;  Location: [facility];  Service: Orthopedics;  Laterality: Left;   FLEXIBLE SIGMOIDOSCOPY  11/25/2011   MODERATE DIVERTICULOSIS/INTERNAL HEMORRHOIDS   S/P Hysterectomy  2000   benign reasons   SAVORY DILATION  06/24/2011   Procedure: SAVORY DILATION;  Surgeon: Dorothyann Peng, MD;  [facility];  Service: Endoscopy;  Laterality: N/A;    Social History   Socioeconomic History   Marital status: Widowed    Spouse name: Not on file   Number of children: 2   Years of education: 12   Highest education level: High school graduate  Occupational History   Occupation: retired    Fish farm manager: RETIRED    Comment: [place]  Tobacco Use   Smoking status: Former    Packs/day: 1.00    Types: Cigarettes   Smokeless tobacco: Never   Tobacco comments:    30 yrs ago  Vaping Use   Vaping Use:  Never used  Substance and Sexual Activity   Alcohol use: No   Drug use: No   Sexual activity: Not on file  Other Topics Concern   Not on file  Social History Narrative   Lives at [facility] in [place].   Right-handed.   2-3 cups caffeine per day.   Social Determinants of Health   Financial Resource Strain: Not on file  Food Insecurity: Not on file  Transportation Needs: Not on file  Physical Activity: Not on file  Stress: Not on file  Social Connections: Not on file  Intimate Partner Violence: Not on file   Family History  Problem Relation Age of Onset   Arthritis Mother        died at age 92   Other Father        died at age 40 - horse accident   Colon cancer Neg Hx            Liver disease  Neg Hx            GI problems Neg Hx               VITAL SIGNS BP 131/74   Pulse 68   Temp (!) 96.8 F (36 C)   Resp 20   Ht 5' (1.524 m)   Wt 128 lb 11.2 oz (58.4 kg)   SpO2 97%   BMI 25.13 kg/m   Outpatient Encounter Medications as of 04/07/2022  Medication Sig   aspirin 81 MG tablet Take 1 tablet (81 mg total) by mouth daily with breakfast.   brimonidine (ALPHAGAN) 0.2 % ophthalmic solution Place 1 drop into both eyes every 8 (eight) hours.   clonazePAM (KLONOPIN) 0.5 MG tablet Take 1 tablet (0.5 mg total) by mouth at bedtime.   cycloSPORINE (RESTASIS) 0.05 % ophthalmic emulsion Place 1 drop into both eyes 2 (two) times daily.   dorzolamide-timolol (COSOPT) 22.3-6.8 MG/ML ophthalmic solution Place 1 drop into both eyes in the morning and at bedtime.   famotidine (PEPCID) 20 MG tablet Take 20 mg by mouth daily.   furosemide (LASIX) 20 MG tablet Take 1 tablet (20 mg total) by mouth every other day.   hydrALAZINE (APRESOLINE) 50 MG tablet Take 1 tablet (50 mg total) by mouth 3 (three) times daily.   melatonin 3 MG TABS tablet Take 3 mg by mouth at bedtime.   Nutritional Supplements (ENSURE ENLIVE PO) Take by mouth. Three Times A Day with meals due to full liquid diet.  Special Instructions: Resident prefers it to be cold. Please provide with ice.   omeprazole (PRILOSEC) 40 MG capsule Take 40 mg by mouth daily.   POTASSIUM CHLORIDE CRYS ER PO Take by mouth. 20 mEq; oral Once A Day Special Instructions: for k+ 3.2   ROCKLATAN 0.02-0.005 % SOLN Place 1 drop into both eyes at bedtime.   sitaGLIPtin (JANUVIA) 25 MG tablet Take 1 tablet (25 mg total) by mouth daily.   UNABLE TO FIND Diet: Full liquids only   [DISCONTINUED] mirtazapine (REMERON) 7.5 MG tablet Take 7.5 mg by mouth at bedtime.   No facility-administered encounter medications on file as of 04/07/2022.     SIGNIFICANT DIAGNOSTIC EXAMS  LABS REVIEWED; PREVIOUS   12-25-21: tsh 1.710 02-22-22; wbc 10.5; hgb 10.5; hct  32.2; mcv 91.0 plt 193; glucose 226; bun 29; creat 1.75; k+ 3.0; na++ 139; ca 8.9; GFR 27 protein 5.8; albumin 3.2 hgb a1c 7.7; vit B 12: 4968; vitamin D 71.97  TODAY  04-01-22: k+ 3.2   Review of Systems  Unable to perform ROS: Dementia    Physical Exam Constitutional:      General: She is not in acute distress.    Appearance: She is well-developed. She is not diaphoretic.  Neck:     Thyroid: No thyromegaly.  Cardiovascular:     Rate and Rhythm: Normal rate and regular rhythm.     Pulses: Normal pulses.     Heart sounds: Normal heart sounds.  Pulmonary:     Effort: Pulmonary effort is normal. No respiratory distress.     Breath sounds: Normal breath sounds.  Abdominal:     General: Bowel sounds are normal. There is no distension.     Palpations: Abdomen is soft.     Tenderness: There is no abdominal tenderness.  Musculoskeletal:        General: Normal range of motion.     Cervical back: Neck supple.     Right lower leg: No edema.     Left lower leg: No edema.  Lymphadenopathy:     Cervical: No cervical adenopathy.  Skin:    General: Skin is warm and dry.  Neurological:     Mental Status: She is alert. Mental status is at baseline.  Psychiatric:        Mood and Affect: Mood normal.     ASSESSMENT/ PLAN:  TODAY  Moderate protein calorie malnutrition Failure to thrive in adult  Will stop remeron and will begin marinol 2.5 mg daily prior to lunch for the next 30 days.       Ok Edwards NP [facility]  [phone]

## 2022-04-11 ENCOUNTER — Other Ambulatory Visit (HOSPITAL_COMMUNITY)
Admission: RE | Admit: 2022-04-11 | Discharge: 2022-04-11 | Disposition: A | Discharge status: Home / Self Care | Payer: PPO | Source: Skilled Nursing Facility | Attending: Adult Health | Admitting: Adult Health

## 2022-04-11 ENCOUNTER — Other Ambulatory Visit: Payer: Self-pay | Admitting: Adult Health

## 2022-04-11 DIAGNOSIS — E876 Hypokalemia: Secondary | ICD-10-CM | POA: Diagnosis present

## 2022-04-11 DIAGNOSIS — R627 Adult failure to thrive: Secondary | ICD-10-CM | POA: Insufficient documentation

## 2022-04-11 LAB — POTASSIUM: Potassium: 4.3 mmol/L (ref 3.5–5.1)

## 2022-04-11 MED ORDER — DRONABINOL 2.5 MG PO CAPS: 2.5000 mg | ORAL_CAPSULE | Freq: Every day | ORAL | 0 refills | Status: DC

## 2022-04-21 ENCOUNTER — Encounter: Payer: Self-pay | Admitting: Adult Health

## 2022-04-21 ENCOUNTER — Non-Acute Institutional Stay (SKILLED_NURSING_FACILITY): Payer: PPO | Admitting: Adult Health

## 2022-04-21 DIAGNOSIS — E1122 Type 2 diabetes mellitus with diabetic chronic kidney disease: Secondary | ICD-10-CM | POA: Diagnosis not present

## 2022-04-21 DIAGNOSIS — F0150 Vascular dementia without behavioral disturbance: Secondary | ICD-10-CM | POA: Diagnosis not present

## 2022-04-21 DIAGNOSIS — I129 Hypertensive chronic kidney disease with stage 1 through stage 4 chronic kidney disease, or unspecified chronic kidney disease: Secondary | ICD-10-CM | POA: Diagnosis not present

## 2022-04-21 DIAGNOSIS — E1169 Type 2 diabetes mellitus with other specified complication: Secondary | ICD-10-CM

## 2022-04-21 DIAGNOSIS — N184 Chronic kidney disease, stage 4 (severe): Secondary | ICD-10-CM

## 2022-04-21 DIAGNOSIS — E785 Hyperlipidemia, unspecified: Secondary | ICD-10-CM

## 2022-04-21 NOTE — Progress Notes (Signed)
Location:  [place] Room Number: 154-D Place of Service:  SNF (31)   CODE STATUS: DNR  Allergies  Allergen Reactions   Metformin     This allergy is not listed under records with [place] facility. Past records states that patient "felt poor" as a reaction to taking this medication    Chief Complaint  Patient presents with   Medical Management of Chronic Issues                           Hyperlipidemia associated with type 2 diabetes mellitus:  Type 2 diabetes mellitus with stage 4 chronic kidney disease and hypertension:Vascular dementia without behavioral disturbance    HPI:  She is a 88 year old long term resident of this facility being seen for the management of her chronic illnesses:    Hyperlipidemia associated with type 2 diabetes mellitus:  Type 2 diabetes mellitus with stage 4 chronic kidney disease and hypertension:Vascular dementia without behavioral disturbance. There are no indications of uncontrolled pain. Her po intake remains very poor; she is losing weight. Remeron has not been effective; will stop marinol as this medication is not providing any benefit.   Past Medical History:  Diagnosis Date   Achilles tendinitis    Allergic rhinitis    Anemia of chronic disease    at least back to 2010   Anxiety    Benign paroxysmal positional vertigo    Cataract    Cerebrovascular accident [facility])    Chronic kidney disease (CKD), stage III (moderate) (HCC)    Degenerative disc disease, thoracic    Depression    Diverticulitis    DJD (degenerative joint disease)    DM (diabetes mellitus) ([place])    type II   GERD (gastroesophageal reflux disease)    HTN (hypertension)    HX: breast cancer    Hyperlipidemia    Hypertonicity of bladder    Knee pain, left    Leg pain    Lymphocytic colitis 2008   treated with Lialda short-term   Memory loss    Obesity    Osteoporosis, unspecified    Renal disease    stage III   Unspecified fall     Unspecified glaucoma(365.9)     Past Surgical History:  Procedure Laterality Date   Bladder tack  2000   BREAST LUMPECTOMY  2004   right   cataracts     CHOLECYSTECTOMY  1960   COLONOSCOPY  07/2007   lymphocytic colitis   ESOPHAGOGASTRODUODENOSCOPY  06/24/2011   Procedure: ESOPHAGOGASTRODUODENOSCOPY (EGD);  Surgeon: Dorothyann Peng, MD;  [facility];  Service: Endoscopy;  Laterality: N/A;.  Peptic stricture, mild erosive reflux esophagitis, hiatal   ESOPHAGOGASTRODUODENOSCOPY (EGD) WITH PROPOFOL N/A 02/15/2022   Procedure: ESOPHAGOGASTRODUODENOSCOPY (EGD) WITH PROPOFOL;  Surgeon: Harvel Quale, MD;  [facility];  Service: Gastroenterology;  Laterality: N/A;   FEMUR IM NAIL Left 04/28/2020   Procedure: INTRAMEDULLARY (IM) NAIL FEMORAL;  Surgeon: Marchia Bond, MD;  Location: [facility] ORS;  Service: Orthopedics;  Laterality: Left;   FLEXIBLE SIGMOIDOSCOPY  11/25/2011   MODERATE DIVERTICULOSIS/INTERNAL HEMORRHOIDS   S/P Hysterectomy  2000   benign reasons   SAVORY DILATION  06/24/2011   Procedure: SAVORY DILATION;  Surgeon: Dorothyann Peng, MD;  [facility];  Service: Endoscopy;  Laterality: N/A;    Social History   Socioeconomic History   Marital status: Widowed    Spouse name: Not  on file   Number of children: 2   Years of education: 12   Highest education level: High school graduate  Occupational History   Occupation: retired    Fish farm manager: RETIRED    Comment: [place]  Tobacco Use   Smoking status: Former    Packs/day: 1.00    Types: Cigarettes   Smokeless tobacco: Never   Tobacco comments:    30 yrs ago  Vaping Use   Vaping Use: Never used  Substance and Sexual Activity   Alcohol use: No   Drug use: No   Sexual activity: Not on file  Other Topics Concern   Not on file  Social History Narrative   Lives at [facility] in [place].   Right-handed.   2-3 cups caffeine per day.   Social Determinants of  Health   Financial Resource Strain: Not on file  Food Insecurity: Not on file  Transportation Needs: Not on file  Physical Activity: Not on file  Stress: Not on file  Social Connections: Not on file  Intimate Partner Violence: Not on file   Family History  Problem Relation Age of Onset   Arthritis Mother        died at age 44   Other Father        died at age 95 - horse accident   Colon cancer Neg Hx            Liver disease Neg Hx            GI problems Neg Hx               VITAL SIGNS BP (!) 143/72   Pulse 67   Temp (!) 96.8 F (36 C)   Resp 20   Ht 5' (1.524 m)   Wt 118 lb 9.6 oz (53.8 kg)   SpO2 97%   BMI 23.16 kg/m   Outpatient Encounter Medications as of 04/21/2022  Medication Sig   aspirin 81 MG tablet Take 1 tablet (81 mg total) by mouth daily with breakfast.   brimonidine (ALPHAGAN) 0.2 % ophthalmic solution Place 1 drop into both eyes every 8 (eight) hours.   clonazePAM (KLONOPIN) 0.5 MG tablet Take 1 tablet (0.5 mg total) by mouth at bedtime.   cycloSPORINE (RESTASIS) 0.05 % ophthalmic emulsion Place 1 drop into both eyes 2 (two) times daily.   dorzolamide-timolol (COSOPT) 22.3-6.8 MG/ML ophthalmic solution Place 1 drop into both eyes in the morning and at bedtime.   famotidine (PEPCID) 20 MG tablet Take 20 mg by mouth daily.   furosemide (LASIX) 20 MG tablet Take 1 tablet (20 mg total) by mouth every other day.   hydrALAZINE (APRESOLINE) 50 MG tablet Take 1 tablet (50 mg total) by mouth 3 (three) times daily.   melatonin 3 MG TABS tablet Take 3 mg by mouth at bedtime.   Nutritional Supplements (ENSURE ENLIVE PO) Take by mouth. Three Times A Day with meals due to full liquid diet.  Special Instructions: Resident prefers it to be cold. Please provide with ice.   omeprazole (PRILOSEC) 40 MG capsule Take 40 mg by mouth daily.   POTASSIUM CHLORIDE CRYS ER PO Take by mouth. 20 mEq; oral Once A Day Special Instructions: for k+ 3.2   ROCKLATAN 0.02-0.005 % SOLN  Place 1 drop into both eyes at bedtime.   sitaGLIPtin (JANUVIA) 25 MG tablet Take 1 tablet (25 mg total) by mouth daily.   UNABLE TO FIND Diet: Full liquids only   [  DISCONTINUED] dronabinol (MARINOL) 2.5 MG capsule Take 1 capsule (2.5 mg total) by mouth daily before lunch.   No facility-administered encounter medications on file as of 04/21/2022.     SIGNIFICANT DIAGNOSTIC EXAMS   LABS REVIEWED; PREVIOUS   12-25-21: tsh 1.710 02-22-22; wbc 10.5; hgb 10.5; hct 32.2; mcv 91.0 plt 193; glucose 226; bun 29; creat 1.75; k+ 3.0; na++ 139; ca 8.9; GFR 27 protein 5.8; albumin 3.2 hgb a1c 7.7; vit B 12: 4968; vitamin D 71.97   TODAY  04-01-22: k+ 3.2 04-11-22: k+ 4.3    Review of Systems  Unable to perform ROS: Dementia   Physical Exam Constitutional:      General: She is not in acute distress.    Appearance: She is well-developed. She is not diaphoretic.  Neck:     Thyroid: No thyromegaly.  Cardiovascular:     Rate and Rhythm: Normal rate and regular rhythm.     Pulses: Normal pulses.     Heart sounds: Normal heart sounds.  Pulmonary:     Effort: Pulmonary effort is normal. No respiratory distress.     Breath sounds: Normal breath sounds.  Abdominal:     General: Bowel sounds are normal. There is no distension.     Palpations: Abdomen is soft.     Tenderness: There is no abdominal tenderness.  Musculoskeletal:        General: Normal range of motion.     Cervical back: Neck supple.     Right lower leg: No edema.     Left lower leg: No edema.  Lymphadenopathy:     Cervical: No cervical adenopathy.  Skin:    General: Skin is warm and dry.  Neurological:     Mental Status: She is alert. Mental status is at baseline.  Psychiatric:        Mood and Affect: Mood normal.     ASSESSMENT/ PLAN:  TODAY  Hyperlipidemia associated with type 2 diabetes mellitus: no statin due to advanced age  13. Type 2 diabetes mellitus with stage 4 chronic kidney disease and hypertension: hgb a1c  7.7; will continue januvia 25 mg daily; asa 81 mg daily   3. Vascular dementia without behavioral disturbance: weight is 118 pounds continues to decline. Will monitor    PREVIOUS   4. Depression recurrent/anxiety: is stable will continue konopin 0.5 mg nightly has ativan 0.5 mg daily as needed will continue remeron 15 mg nightly   5. Bilateral lower extremity edema: will continue lasix 20 mg every other day; will give 40 k+ one time for k+ 3.0   6. Normochromic normocytic anemia: hgb 10.5 will monitor   7. Mild protein calorie malnutrition: albumin 3.2 protein 5.8 will continue supplements as directed  8. Aortic atherosclerosis (ct 12-25-21)   9. Bilateral increased intraocular pressure: will continue alphagan every 8 hours; cosopt twice daily; rocklatan nightly   10. Hypertension associated with stage 4 chronic kidney disease due to type 2 diabetes: b/p 133/74 will continue apresoline 50 mg three times daily  11. Esophageal dysphagia history of esophageal impaction: is on full liquid diet  12. Esophageal reflux disease without esophagitis: will continue pepcid 20 mg daily and prilosec 40 mg daily   13. CKD stage 4 due type 2 diabetes mellitus: bun 29; creat 1.75; creat [facility]  call [phone]

## 2022-04-22 ENCOUNTER — Encounter: Payer: Self-pay | Admitting: Adult Health

## 2022-04-22 ENCOUNTER — Non-Acute Institutional Stay (SKILLED_NURSING_FACILITY): Payer: PPO | Admitting: Adult Health

## 2022-04-22 DIAGNOSIS — N184 Chronic kidney disease, stage 4 (severe): Secondary | ICD-10-CM

## 2022-04-22 DIAGNOSIS — F0150 Vascular dementia without behavioral disturbance: Secondary | ICD-10-CM | POA: Diagnosis not present

## 2022-04-22 DIAGNOSIS — I700 Atherosclerosis of aorta: Secondary | ICD-10-CM | POA: Diagnosis not present

## 2022-04-22 DIAGNOSIS — I129 Hypertensive chronic kidney disease with stage 1 through stage 4 chronic kidney disease, or unspecified chronic kidney disease: Secondary | ICD-10-CM | POA: Diagnosis not present

## 2022-04-22 DIAGNOSIS — E1122 Type 2 diabetes mellitus with diabetic chronic kidney disease: Secondary | ICD-10-CM | POA: Diagnosis not present

## 2022-04-22 NOTE — Progress Notes (Signed)
Location:  [place] Room Number: 154 Place of Service:  SNF (31)   CODE STATUS: dnr   Allergies  Allergen Reactions   Metformin     This allergy is not listed under records with [place] facility. Past records states that patient "felt poor" as a reaction to taking this medication    Chief Complaint  Patient presents with   Acute Visit    Care plan meeting    HPI:  We have come together for her care plan meeting. MOST form has been filled out: comfort measures. She is extensive assist to dependent with her adls. She is non ambulatory; no recent falls. She is frequently incontinent of bladder and bowel. She spends nearly all of her time in bed. Dietary:  She is on a full liquid diet; minimal po intake.she has lost weight from 124.1 pounds on 04-15-22 to her current weight os 118.6 pounds. Will stop most of her medications.  She continues to be followed for her chronic illnesses including:  Aortic atherosclerosis Type 2 diabetes mellitus with stage 4 chronic kidney disease and hypertension Vascular dementia without behavioral disturbance.   Past Medical History:  Diagnosis Date   Achilles tendinitis    Allergic rhinitis    Anemia of chronic disease    at least back to 2010   Anxiety    Benign paroxysmal positional vertigo    Cataract    Cerebrovascular accident [facility])    Chronic kidney disease (CKD), stage III (moderate) (HCC)    Degenerative disc disease, thoracic    Depression    Diverticulitis    DJD (degenerative joint disease)    DM (diabetes mellitus) ([place])    type II   GERD (gastroesophageal reflux disease)    HTN (hypertension)    HX: breast cancer    Hyperlipidemia    Hypertonicity of bladder    Knee pain, left    Leg pain    Lymphocytic colitis 2008   treated with Lialda short-term   Memory loss    Obesity    Osteoporosis, unspecified    Renal disease    stage III   Unspecified fall    Unspecified glaucoma(365.9)      Past Surgical History:  Procedure Laterality Date   Bladder tack  2000   BREAST LUMPECTOMY  2004   right   cataracts     CHOLECYSTECTOMY  1960   COLONOSCOPY  07/2007   lymphocytic colitis   ESOPHAGOGASTRODUODENOSCOPY  06/24/2011   Procedure: ESOPHAGOGASTRODUODENOSCOPY (EGD);  Surgeon: Dorothyann Peng, MD;  [facility];  Service: Endoscopy;  Laterality: N/A;.  Peptic stricture, mild erosive reflux esophagitis, hiatal   ESOPHAGOGASTRODUODENOSCOPY (EGD) WITH PROPOFOL N/A 02/15/2022   Procedure: ESOPHAGOGASTRODUODENOSCOPY (EGD) WITH PROPOFOL;  Surgeon: Harvel Quale, MD;  [facility];  Service: Gastroenterology;  Laterality: N/A;   FEMUR IM NAIL Left 04/28/2020   Procedure: INTRAMEDULLARY (IM) NAIL FEMORAL;  Surgeon: Marchia Bond, MD;  [facility];  Service: Orthopedics;  Laterality: Left;   FLEXIBLE SIGMOIDOSCOPY  11/25/2011   MODERATE DIVERTICULOSIS/INTERNAL HEMORRHOIDS   S/P Hysterectomy  2000   benign reasons   SAVORY DILATION  06/24/2011   Procedure: SAVORY DILATION;  Surgeon: Dorothyann Peng, MD;  [facility];  Service: Endoscopy;  Laterality: N/A;    Social History   Socioeconomic History   Marital status: Widowed    Spouse name: Not on file   Number of children: 2   Years of  education: 12   Highest education level: High school graduate  Occupational History   Occupation: retired    Fish farm manager: RETIRED    Comment: [employer]  Tobacco Use   Smoking status: Former    Packs/day: 1.00    Types: Cigarettes   Smokeless tobacco: Never   Tobacco comments:    30 yrs ago  Vaping Use   Vaping Use: Never used  Substance and Sexual Activity   Alcohol use: No   Drug use: No   Sexual activity: Not on file  Other Topics Concern   Not on file  Social History Narrative   L[facility] at [facility] in [place].   Right-handed.   2-3 cups caffeine per day.   Social Determinants of Health   Financial Resource  Strain: Not on file  Food Insecurity: Not on file  Transportation Needs: Not on file  Physical Activity: Not on file  Stress: Not on file  Social Connections: Not on file  Intimate Partner Violence: Not on file   Family History  Problem Relation Age of Onset   Arthritis Mother        died at age 68   Other Father        died at age 67 - horse accident   Colon cancer Neg Hx            Liver disease Neg Hx            GI problems Neg Hx               VITAL SIGNS BP (!) 143/72   Pulse 60   Temp 97.8 F (36.6 C)   Ht 5' (1.524 m)   Wt 118 lb 9.6 oz (53.8 kg)   BMI 23.16 kg/m   Outpatient Encounter Medications as of 04/22/2022  Medication Sig   aspirin 81 MG tablet Take 1 tablet (81 mg total) by mouth daily with breakfast.   brimonidine (ALPHAGAN) 0.2 % ophthalmic solution Place 1 drop into both eyes every 8 (eight) hours.   clonazePAM (KLONOPIN) 0.5 MG tablet Take 1 tablet (0.5 mg total) by mouth at bedtime.   cycloSPORINE (RESTASIS) 0.05 % ophthalmic emulsion Place 1 drop into both eyes 2 (two) times daily.   dorzolamide-timolol (COSOPT) 22.3-6.8 MG/ML ophthalmic solution Place 1 drop into both eyes in the morning and at bedtime.   famotidine (PEPCID) 20 MG tablet Take 20 mg by mouth daily.   furosemide (LASIX) 20 MG tablet Take 1 tablet (20 mg total) by mouth every other day.   hydrALAZINE (APRESOLINE) 50 MG tablet Take 1 tablet (50 mg total) by mouth 3 (three) times daily.   melatonin 3 MG TABS tablet Take 3 mg by mouth at bedtime.   Nutritional Supplements (ENSURE ENLIVE PO) Take by mouth. Three Times A Day with meals due to full liquid diet.  Special Instructions: Resident prefers it to be cold. Please provide with ice.   omeprazole (PRILOSEC) 40 MG capsule Take 40 mg by mouth daily.   POTASSIUM CHLORIDE CRYS ER PO Take by mouth. 20 mEq; oral Once A Day Special Instructions: for k+ 3.2   ROCKLATAN 0.02-0.005 % SOLN Place 1 drop into both eyes at bedtime.   sitaGLIPtin  (JANUVIA) 25 MG tablet Take 1 tablet (25 mg total) by mouth daily.   UNABLE TO FIND Diet: Full liquids only   No facility-administered encounter medications on file as of 04/22/2022.     SIGNIFICANT DIAGNOSTIC EXAMS  LABS REVIEWED; PREVIOUS  12-25-21: tsh 1.710 02-22-22; wbc 10.5; hgb 10.5; hct 32.2; mcv 91.0 plt 193; glucose 226; bun 29; creat 1.75; k+ 3.0; na++ 139; ca 8.9; GFR 27 protein 5.8; albumin 3.2 hgb a1c 7.7; vit B 12: 4968; vitamin D 71.97   TODAY  04-01-22: k+ 3.2  04-11-22: k+ 4.3  Review of Systems  Unable to perform ROS: Dementia   Physical Exam Constitutional:      General: She is not in acute distress.    Appearance: She is underweight. She is not diaphoretic.  Neck:     Thyroid: No thyromegaly.  Cardiovascular:     Rate and Rhythm: Normal rate and regular rhythm.     Pulses: Normal pulses.     Heart sounds: Normal heart sounds.  Pulmonary:     Effort: Pulmonary effort is normal. No respiratory distress.     Breath sounds: Normal breath sounds.  Abdominal:     General: Bowel sounds are normal. There is no distension.     Palpations: Abdomen is soft.     Tenderness: There is no abdominal tenderness.  Musculoskeletal:        General: Normal range of motion.     Cervical back: Neck supple.     Right lower leg: No edema.     Left lower leg: No edema.  Lymphadenopathy:     Cervical: No cervical adenopathy.  Skin:    General: Skin is warm and dry.  Neurological:     Mental Status: She is alert. Mental status is at baseline.  Psychiatric:        Mood and Affect: Mood normal.      ASSESSMENT/ PLAN:  TODAY  Aortic atherosclerosis Type 2 diabetes mellitus with stage 4 chronic kidney disease and hypertension Vascular dementia without behavioral disturbance.   Will stop the following: asa; melatonin; lasix; k+ ; ensure; januvia; daily weights; cbg's;  Will setup hospice care.   Time spent with patient: 40 minutes: reviewed medications; overall status;  goals of care.    Ok Edwards NP [facility]   call [phone]

## 2022-04-27 ENCOUNTER — Other Ambulatory Visit: Payer: Self-pay | Admitting: Adult Health

## 2022-04-27 ENCOUNTER — Non-Acute Institutional Stay (SKILLED_NURSING_FACILITY): Payer: PPO | Admitting: Adult Health

## 2022-04-27 ENCOUNTER — Encounter: Payer: Self-pay | Admitting: Adult Health

## 2022-04-27 DIAGNOSIS — R627 Adult failure to thrive: Secondary | ICD-10-CM

## 2022-04-27 DIAGNOSIS — F0150 Vascular dementia without behavioral disturbance: Secondary | ICD-10-CM

## 2022-04-27 MED ORDER — MORPHINE SULFATE (CONCENTRATE) 20 MG/ML PO SOLN: 5.0000 mg | ORAL | 0 refills | Status: DC | PRN

## 2022-04-27 NOTE — Progress Notes (Signed)
Location:  [place] Room Number: 154-D Place of Service:  SNF (31)   CODE STATUS: DNR  Allergies  Allergen Reactions   Metformin     This allergy is not listed under records with [place] facility. Past records states that patient "felt poor" as a reaction to taking this medication    Chief Complaint  Patient presents with   Acute Visit    Status change     HPI:  Her status continues to decline. She is no longer taking any po. She is now being followed by hospice care. She is resistant to her eye drops; will need to stop those. The nursing staff states that she is have some distress and is restless.   Past Medical History:  Diagnosis Date   Achilles tendinitis    Allergic rhinitis    Anemia of chronic disease    at least back to 2010   Anxiety    Benign paroxysmal positional vertigo    Cataract    Cerebrovascular accident [facility])    Chronic kidney disease (CKD), stage III (moderate) (HCC)    Degenerative disc disease, thoracic    Depression    Diverticulitis    DJD (degenerative joint disease)    DM (diabetes mellitus) ([place])    type II   GERD (gastroesophageal reflux disease)    HTN (hypertension)    HX: breast cancer    Hyperlipidemia    Hypertonicity of bladder    Knee pain, left    Leg pain    Lymphocytic colitis 2008   treated with Lialda short-term   Memory loss    Obesity    Osteoporosis, unspecified    Renal disease    stage III   Unspecified fall    Unspecified glaucoma(365.9)     Past Surgical History:  Procedure Laterality Date   Bladder tack  2000   BREAST LUMPECTOMY  2004   right   cataracts     CHOLECYSTECTOMY  1960   COLONOSCOPY  07/2007   lymphocytic colitis   ESOPHAGOGASTRODUODENOSCOPY  06/24/2011   Procedure: ESOPHAGOGASTRODUODENOSCOPY (EGD);  Surgeon: Dorothyann Peng, MD;  [facility];  Service: Endoscopy;  Laterality: N/A;.  Peptic stricture, mild erosive reflux esophagitis, hiatal    ESOPHAGOGASTRODUODENOSCOPY (EGD) WITH PROPOFOL N/A 02/15/2022   Procedure: ESOPHAGOGASTRODUODENOSCOPY (EGD) WITH PROPOFOL;  Surgeon: Harvel Quale, MD;  [facility];  Service: Gastroenterology;  Laterality: N/A;   FEMUR IM NAIL Left 04/28/2020   Procedure: INTRAMEDULLARY (IM) NAIL FEMORAL;  Surgeon: Marchia Bond, MD;  Location: [facility];  Service: Orthopedics;  Laterality: Left;   FLEXIBLE SIGMOIDOSCOPY  11/25/2011   MODERATE DIVERTICULOSIS/INTERNAL HEMORRHOIDS   S/P Hysterectomy  2000   benign reasons   SAVORY DILATION  06/24/2011   Procedure: SAVORY DILATION;  Surgeon: Dorothyann Peng, MD;  [facility];  Service: Endoscopy;  Laterality: N/A;    Social History   Socioeconomic History   Marital status: Widowed    Spouse name: Not on file   Number of children: 2   Years of education: 12   Highest education level: High school graduate  Occupational History   Occupation: retired    Fish farm manager: RETIRED    Comment: [place]  Tobacco Use   Smoking status: Former    Packs/day: 1.00    Types: Cigarettes   Smokeless tobacco: Never   Tobacco comments:    30 yrs ago  Vaping Use   Vaping Use: Never used  Substance and Sexual Activity   Alcohol use: No   Drug use: No   Sexual activity: Not on file  Other Topics Concern   Not on file  Social History Narrative   Lives at [facility] in [place].   Right-handed.   2-3 cups caffeine per day.   Social Determinants of Health   Financial Resource Strain: Not on file  Food Insecurity: Not on file  Transportation Needs: Not on file  Physical Activity: Not on file  Stress: Not on file  Social Connections: Not on file  Intimate Partner Violence: Not on file   Family History  Problem Relation Age of Onset   Arthritis Mother        died at age 8   Other Father        died at age 64 - horse accident   Colon cancer Neg Hx            Liver disease Neg Hx            GI  problems Neg Hx               VITAL SIGNS BP 136/89   Pulse 97   Temp 98.2 F (36.8 C)   Resp 20   Ht 5' (1.524 m)   Wt 113 lb 11.2 oz (51.6 kg)   SpO2 97%   BMI 22.21 kg/m   Outpatient Encounter Medications as of 04/27/2022  Medication Sig   UNABLE TO FIND Diet: Full liquids only   aspirin 81 MG tablet Take 1 tablet (81 mg total) by mouth daily with breakfast.   brimonidine (ALPHAGAN) 0.2 % ophthalmic solution Place 1 drop into both eyes every 8 (eight) hours.   clonazePAM (KLONOPIN) 0.5 MG tablet Take 1 tablet (0.5 mg total) by mouth at bedtime.   cycloSPORINE (RESTASIS) 0.05 % ophthalmic emulsion Place 1 drop into both eyes 2 (two) times daily.   dorzolamide-timolol (COSOPT) 22.3-6.8 MG/ML ophthalmic solution Place 1 drop into both eyes in the morning and at bedtime.   famotidine (PEPCID) 20 MG tablet Take 20 mg by mouth daily.   furosemide (LASIX) 20 MG tablet Take 1 tablet (20 mg total) by mouth every other day.   hydrALAZINE (APRESOLINE) 50 MG tablet Take 1 tablet (50 mg total) by mouth 3 (three) times daily.   melatonin 3 MG TABS tablet Take 3 mg by mouth at bedtime.   Nutritional Supplements (ENSURE ENLIVE PO) Take by mouth. Three Times A Day with meals due to full liquid diet.  Special Instructions: Resident prefers it to be cold. Please provide with ice.   omeprazole (PRILOSEC) 40 MG capsule Take 40 mg by mouth daily.   POTASSIUM CHLORIDE CRYS ER PO Take by mouth. 20 mEq; oral Once A Day Special Instructions: for k+ 3.2   ROCKLATAN 0.02-0.005 % SOLN Place 1 drop into both eyes at bedtime.   sitaGLIPtin (JANUVIA) 25 MG tablet Take 1 tablet (25 mg total) by mouth daily.   No facility-administered encounter medications on file as of 04/27/2022.     SIGNIFICANT DIAGNOSTIC EXAMS  LABS REVIEWED; PREVIOUS   12-25-21: tsh 1.710 02-22-22; wbc 10.5; hgb 10.5; hct 32.2; mcv 91.0 plt 193; glucose 226; bun 29; creat 1.75; k+ 3.0; na++ 139; ca 8.9; GFR 27 protein 5.8; albumin 3.2  hgb a1c 7.7; vit B 12: 4968; vitamin D 71.97  04-01-22: k+ 3.2  04-11-22: k+ 4.3  NO NEW LABS.   Review of Systems  Unable to perform  ROS: Dementia   Physical Exam Constitutional:      General: She is not in acute distress. Neck:     Thyroid: No thyromegaly.  Cardiovascular:     Rate and Rhythm: Normal rate and regular rhythm.     Heart sounds: Normal heart sounds.  Pulmonary:     Effort: Pulmonary effort is normal. No respiratory distress.     Breath sounds: Normal breath sounds.  Abdominal:     General: Bowel sounds are normal. There is no distension.     Palpations: Abdomen is soft.     Tenderness: There is no abdominal tenderness.  Musculoskeletal:     Cervical back: Neck supple.     Right lower leg: No edema.     Left lower leg: No edema.  Lymphadenopathy:     Cervical: No cervical adenopathy.  Skin:    General: Skin is warm and dry.  Neurological:     Comments: Not aware; is restless         ASSESSMENT/ PLAN:  TODAY  Vascular dementia without behavioral disturbance Failure to thrive in adult  Will stop all eye drops Will begin roxanol 5 mg every 6 hours as needed    Ok Edwards NP [facility] Adult Medicine  call [phone]

## 2022-05-21 DEATH — deceased
